# Patient Record
Sex: Female | Born: 1935
Health system: Southern US, Community
[De-identification: ages and names within clinical notes are randomized; demographics above are authoritative.]

## PROBLEM LIST (undated history)

## (undated) DIAGNOSIS — G4700 Insomnia, unspecified: Secondary | ICD-10-CM

## (undated) DIAGNOSIS — K449 Diaphragmatic hernia without obstruction or gangrene: Secondary | ICD-10-CM

## (undated) DIAGNOSIS — E039 Hypothyroidism, unspecified: Secondary | ICD-10-CM

## (undated) DIAGNOSIS — Z9181 History of falling: Secondary | ICD-10-CM

## (undated) DIAGNOSIS — S91109A Unspecified open wound of unspecified toe(s) without damage to nail, initial encounter: Secondary | ICD-10-CM

## (undated) DIAGNOSIS — M810 Age-related osteoporosis without current pathological fracture: Secondary | ICD-10-CM

## (undated) DIAGNOSIS — H9319 Tinnitus, unspecified ear: Secondary | ICD-10-CM

## (undated) DIAGNOSIS — M545 Low back pain, unspecified: Secondary | ICD-10-CM

## (undated) DIAGNOSIS — F411 Generalized anxiety disorder: Secondary | ICD-10-CM

## (undated) DIAGNOSIS — K5900 Constipation, unspecified: Secondary | ICD-10-CM

## (undated) DIAGNOSIS — M1990 Unspecified osteoarthritis, unspecified site: Secondary | ICD-10-CM

## (undated) DIAGNOSIS — K219 Gastro-esophageal reflux disease without esophagitis: Secondary | ICD-10-CM

## (undated) DIAGNOSIS — M5430 Sciatica, unspecified side: Secondary | ICD-10-CM

## (undated) DIAGNOSIS — I10 Essential (primary) hypertension: Secondary | ICD-10-CM

## (undated) DIAGNOSIS — N200 Calculus of kidney: Secondary | ICD-10-CM

## (undated) DIAGNOSIS — L723 Sebaceous cyst: Secondary | ICD-10-CM

## (undated) DIAGNOSIS — H3530 Unspecified macular degeneration: Secondary | ICD-10-CM

## (undated) DIAGNOSIS — J301 Allergic rhinitis due to pollen: Secondary | ICD-10-CM

## (undated) DIAGNOSIS — N6019 Diffuse cystic mastopathy of unspecified breast: Secondary | ICD-10-CM

## (undated) DIAGNOSIS — H269 Unspecified cataract: Secondary | ICD-10-CM

## (undated) DIAGNOSIS — I480 Paroxysmal atrial fibrillation: Secondary | ICD-10-CM

## (undated) DIAGNOSIS — G609 Hereditary and idiopathic neuropathy, unspecified: Secondary | ICD-10-CM

## (undated) DIAGNOSIS — R42 Dizziness and giddiness: Secondary | ICD-10-CM

## (undated) DIAGNOSIS — E785 Hyperlipidemia, unspecified: Secondary | ICD-10-CM

## (undated) HISTORY — DX: Sciatica, unspecified side: M54.30

## (undated) HISTORY — DX: Unspecified macular degeneration: H35.30

## (undated) HISTORY — PX: ABDOMINAL HYSTERECTOMY: SHX81

## (undated) HISTORY — DX: Constipation, unspecified: K59.00

## (undated) HISTORY — DX: Sebaceous cyst: L72.3

## (undated) HISTORY — DX: Hyperlipidemia, unspecified: E78.5

## (undated) HISTORY — PX: CATARACT EXTRACTION, BILATERAL: SHX1313

## (undated) HISTORY — DX: Gastro-esophageal reflux disease without esophagitis: K21.9

## (undated) HISTORY — DX: Unspecified cataract: H26.9

## (undated) HISTORY — DX: Hypothyroidism, unspecified: E03.9

## (undated) HISTORY — DX: Diaphragmatic hernia without obstruction or gangrene: K44.9

## (undated) HISTORY — DX: Dizziness and giddiness: R42

## (undated) HISTORY — DX: Essential (primary) hypertension: I10

## (undated) HISTORY — DX: Calculus of kidney: N20.0

## (undated) HISTORY — DX: Diffuse cystic mastopathy of unspecified breast: N60.19

## (undated) HISTORY — DX: Insomnia, unspecified: G47.00

## (undated) HISTORY — DX: Low back pain, unspecified: M54.50

## (undated) HISTORY — DX: Age-related osteoporosis without current pathological fracture: M81.0

## (undated) HISTORY — DX: Generalized anxiety disorder: F41.1

## (undated) HISTORY — DX: History of falling: Z91.81

## (undated) HISTORY — DX: Unspecified osteoarthritis, unspecified site: M19.90

## (undated) HISTORY — DX: Unspecified open wound of unspecified toe(s) without damage to nail, initial encounter: S91.109A

## (undated) HISTORY — DX: Tinnitus, unspecified ear: H93.19

## (undated) HISTORY — DX: Hereditary and idiopathic neuropathy, unspecified: G60.9

## (undated) HISTORY — DX: Low back pain: M54.5

## (undated) HISTORY — DX: Allergic rhinitis due to pollen: J30.1

---
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
## 2001-08-03 ENCOUNTER — Encounter: Payer: Self-pay | Admitting: Internal Medicine

## 2001-08-03 ENCOUNTER — Encounter: Admission: RE | Admit: 2001-08-03 | Discharge: 2001-08-03 | Payer: Self-pay | Admitting: Internal Medicine

## 2002-01-22 ENCOUNTER — Encounter: Payer: Self-pay | Admitting: Internal Medicine

## 2002-01-22 ENCOUNTER — Encounter: Admission: RE | Admit: 2002-01-22 | Discharge: 2002-01-22 | Payer: Self-pay | Admitting: Internal Medicine

## 2002-05-01 LAB — HM COLONOSCOPY

## 2002-08-26 ENCOUNTER — Encounter: Admission: RE | Admit: 2002-08-26 | Discharge: 2002-08-26 | Payer: Self-pay | Admitting: Internal Medicine

## 2002-08-26 ENCOUNTER — Encounter: Payer: Self-pay | Admitting: Internal Medicine

## 2003-10-10 ENCOUNTER — Encounter: Admission: RE | Admit: 2003-10-10 | Discharge: 2003-10-10 | Payer: Self-pay | Admitting: Internal Medicine

## 2004-03-03 ENCOUNTER — Ambulatory Visit (HOSPITAL_COMMUNITY): Admission: RE | Admit: 2004-03-03 | Discharge: 2004-03-03 | Payer: Self-pay | Admitting: Specialist

## 2004-04-14 ENCOUNTER — Ambulatory Visit (HOSPITAL_COMMUNITY): Admission: RE | Admit: 2004-04-14 | Discharge: 2004-04-14 | Payer: Self-pay | Admitting: Specialist

## 2004-11-11 ENCOUNTER — Encounter: Admission: RE | Admit: 2004-11-11 | Discharge: 2004-11-11 | Payer: Self-pay | Admitting: Internal Medicine

## 2005-11-14 ENCOUNTER — Encounter: Admission: RE | Admit: 2005-11-14 | Discharge: 2005-11-14 | Payer: Self-pay | Admitting: Internal Medicine

## 2006-11-30 ENCOUNTER — Encounter: Admission: RE | Admit: 2006-11-30 | Discharge: 2006-11-30 | Payer: Self-pay | Admitting: Internal Medicine

## 2007-07-23 ENCOUNTER — Inpatient Hospital Stay (HOSPITAL_COMMUNITY): Admission: EM | Admit: 2007-07-23 | Discharge: 2007-07-30 | Payer: Self-pay | Admitting: *Deleted

## 2007-07-25 ENCOUNTER — Encounter (INDEPENDENT_AMBULATORY_CARE_PROVIDER_SITE_OTHER): Payer: Self-pay | Admitting: Surgery

## 2007-07-25 HISTORY — PX: CHOLECYSTECTOMY: SHX55

## 2007-12-11 ENCOUNTER — Encounter: Admission: RE | Admit: 2007-12-11 | Discharge: 2007-12-11 | Payer: Self-pay | Admitting: Internal Medicine

## 2008-12-15 ENCOUNTER — Encounter: Admission: RE | Admit: 2008-12-15 | Discharge: 2008-12-15 | Payer: Self-pay | Admitting: Internal Medicine

## 2009-04-04 ENCOUNTER — Emergency Department (HOSPITAL_COMMUNITY): Admission: EM | Admit: 2009-04-04 | Discharge: 2009-04-04 | Payer: Self-pay | Admitting: Emergency Medicine

## 2009-04-06 ENCOUNTER — Ambulatory Visit (HOSPITAL_COMMUNITY): Admission: RE | Admit: 2009-04-06 | Discharge: 2009-04-06 | Payer: Self-pay | Admitting: Ophthalmology

## 2009-12-23 ENCOUNTER — Encounter: Admission: RE | Admit: 2009-12-23 | Discharge: 2009-12-23 | Payer: Self-pay | Admitting: Internal Medicine

## 2010-11-20 LAB — CBC
HCT: 38.1 % (ref 36.0–46.0)
Hemoglobin: 12.8 g/dL (ref 12.0–15.0)
MCHC: 33.7 g/dL (ref 30.0–36.0)
MCV: 85.5 fL (ref 78.0–100.0)
Platelets: 153 10*3/uL (ref 150–400)
WBC: 6.8 10*3/uL (ref 4.0–10.5)

## 2010-11-20 LAB — EYE CULTURE: Culture: NO GROWTH

## 2010-11-20 LAB — GRAM STAIN

## 2010-11-20 LAB — BASIC METABOLIC PANEL
CO2: 31 mEq/L (ref 19–32)
Chloride: 100 mEq/L (ref 96–112)
GFR calc non Af Amer: >60 mL/min (ref >60)

## 2010-12-28 NOTE — Op Note (Signed)
[name], [name]                 ACCOUNT NO.:  [id number]   MEDICAL RECORD NO.:  [id number]          PATIENT TYPE:  INP   LOCATION:  5709                         FACILITY:  [facility]   PHYSICIAN:  Currie Paris, M.D.DATE OF BIRTH:  19-Nov-1930   DATE OF PROCEDURE:  07/25/2007  DATE OF DISCHARGE:                               OPERATIVE REPORT   PREOPERATIVE DIAGNOSIS:  Acute cholecystitis with recent pancreatitis.   POSTOPERATIVE DIAGNOSIS:  Acute cholecystitis with recent pancreatitis.   OPERATION:  Laparoscopic cholecystectomy with operative cholangiogram.   SURGEON:  Currie Paris, M.D.   ASSISTANT:  Cherylynn Ridges, M.D.   ANESTHESIA:  General endotracheal.   CLINICAL HISTORY:  This is a 77 year old lady who presented to the  emergency room with epigastric pain, which began after eating hamburger.  It was rated a 9/10 when she came in.  Her past history is noted for  hypertension and other medical issues.  On evaluation she was found to  have a white count of 14,000 with a lipase of 3400.  Abdominal  ultrasound showed what looked like a little thick-walled gallbladder but  no obvious stones.  Admission diagnosis was acute pancreatitis.  Follow-  up CT scan yesterday evening, however, showed a markedly worse situation  around the gallbladder with free fluid, question of gallbladder  perforation.  She appeared to have acute cholecystitis and there were  stones noted on the CT scan not seen on the prior ultrasound.  With this  finding, we elected to proceed to doing surgery with a diagnosis of an  acute cholecystitis.   DESCRIPTION OF PROCEDURE:  The patient was seen again in the holding  area and she had no further questions.  She was taken to the operating  room and after satisfactory general endotracheal anesthesia had been  obtained, the abdomen was prepped and draped.  The time-out was  performed.   The umbilical incision was made after anesthetizing the skin  and  subcutaneous tissue with 0.25% plain Marcaine.  The fascia was opened  and peritoneal cavity entered under direct vision.  The Hasson was  introduced and the abdomen insufflated to 15.   There was bloody free fluid present in the abdominal cavity.  There was  a mass-like effect at the junction of the falciform ligament with the  gallbladder, which I initially could not tell whether this was tumor,  hematoma, inflammatory.  There was some necrotic material lying  alongside the gallbladder and the gallbladder was acutely inflamed with  areas of necrosis consistent with acute cholecystitis.  There was,  however, no leaking bile but what looked like some blood.   Additional trocars were placed in the usual positions.  I sent some of  the necrotic-looking tissue to pathology for frozen, but this just all  turned out to be inflammatory.  In perusing the abdomen a little bit  more, we could see areas of fat necrosis consistent with pancreatitis.  These were primarily small areas, but I eventually decided that this  area seen at the falciform-liver junction was inflammatory, probably fat  necrosis  from the pancreatitis with perhaps some hemorrhage.   I was then able to grasp the gallbladder and retract it in the usual  fashion and began to dissect around the neck of the gallbladder.  This  was fairly edematous but eventually I was able to identify and get  around a tubular structure which I identified as the cystic duct and  thought I was really at the junction of the cystic duct and the  gallbladder.  I was able to free up the peritoneum on  each side of the  gallbladder and see the cystic artery posteriorly.  Once I had a nice  long segment of cystic duct, I opened it at the junction with the  gallbladder and attempted a couple of times to do a cholangiogram.  There is a little leak where we had dissected the cystic duct a little  bit more proximally and I was finally able to get a clip  across that so  that it did not leak and we were able to get a cholangiogram.   The cholangiogram showed good filling of a very long cystic duct,  filling of the common duct with no filling defects noted.  The hepatic  radicles appeared to be okay.  The pancreatic duct did fill. Contrast  entered the duodenum.   Once we had the anatomy identified, I went ahead and divided the cystic  duct.  I then tied it with an Endoloop of PDS plus a clip and resected a  small segment.  The gallbladder was then removed from below to above  with branches of the cystic artery clipped as identified.  It was placed  in a bag.  A irrigated copiously and made sure everything was dry.  I  took a few more looks at the area of concern around the edge of the  liver, which was inflammatory.  I then put a 19 Blake drain in to lay  along the bed of the gallbladder.  I used least 2 L of irrigation to  make sure we had everything diluted out.  We made a final check of the  bed of gallbladder and there was no leakage of either blood or bile, and  the clips appeared to be intact with no leaking around the cystic duct  stump.   With that done I removed the remaining lateral port.  The drain was  secured with a 2-0 nylon.  The umbilical port was removed and the figure-  of-eight suture used to close that incision.  The abdomen was deflated  through the epigastric port.  Skin was closed with 4-0 subcuticular and  Dermabond.   The patient tolerated the procedure well.  There were no operative  complications.  Counts were correct.      Currie Paris, M.D.  Electronically Signed     CJS/MEDQ  D:  07/25/2007  T:  07/26/2007  Job:  [id number]   cc:   Lenon Curt. Chilton Si, M.D.

## 2010-12-28 NOTE — Op Note (Signed)
[name], [name]                 ACCOUNT NO.:  [id number]   MEDICAL RECORD NO.:  [id number]          PATIENT TYPE:  AMB   LOCATION:  SDS                          FACILITY:  [facility]   PHYSICIAN:  Alford Highland. Rankin, M.D.   DATE OF BIRTH:  05/20/1931   DATE OF PROCEDURE:  04/06/2009  DATE OF DISCHARGE:  04/06/2009                               OPERATIVE REPORT   PREOPERATIVE DIAGNOSIS:  Endophthalmitis suspected, left eye.   POSTOPERATIVE DIAGNOSES:  1. Endophthalmitis suspected, left eye.  2. 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   PROCEDURES:  1. Posterior vitrectomy and membrane peel - epiretinal membrane - 25      gauge, left eye.  2. Posterior synechialysis.  3. Instillation of intravitreal antibiotics, Fortaz 2.25 mg and      vancomycin 1 mg.  4. Vitreous culture of vitreous fluids with the needle biopsy as well      as with vitreous washings sent from the anterior chamber in the      vitreous cavity.   SURGEON:  Alford Highland. Rankin, MD   ANESTHESIA:  Local retrobulbar, monitored anesthesia control.   INDICATION FOR THE PROCEDURE:  The patient is a 77 year old woman with a  long history of age-related macular generation requiring intravitreal  injections.  Some 5-6 days previously, underwent uncomplicated  intravitreal office base injection and found to have increasing floaters  over the next 2-3 days.  She was seen over the weekend by the on-call  physician, who noted a mild vitreous change but no real change in  vision.  The patient has no significant pain since the onset of her  symptoms.   Today in the office, she was found to have hypopyon, pupillary fibrin  membrane over the pupil as well as vitreous debris consistent with  possible suspected endophthalmitis.  The patient understands this is an  attempt to remove the vitreous opacification and haze.  She understands  the risk of anesthesia including  the rare occurrence of death, but also  to the eye including but not limited to hemorrhage, infection, need for  another surgery, loss of vision, progressive disease despite  intervention.   After appropriate signed consent was obtained, the patient was taken to  the operating room.  In the operating room, appropriate monitors  followed by mild sedation.  Xylocaine 2% 5 mL injected retrobulbar with  an additional 5 mL laterally in fashion of modified Darel Hong.  Left  periocular region was prepped and draped in the usual ophthalmic  fashion.  Lid speculum applied.  A Lewicky anterior chamber maintainer  was placed via paracentesis incision with a 20-gauge MVR blade  inferotemporally.  A 30-gauge needle was then used to aspirate small of  aqueous fluid, which was plated directly.  20 MVR blade was used to make  separate paracentesis incision.  Fusion was then turned on.  This  allowed for engagement of the pupillary fibrin membrane directly with  forceps, and this was extracted.  Portions of this were  directly plated  for culture.  Thereafter, the superior 25-gauge trocars were applied.  Core vitrectomy was then begun.  Notable findings of significant white  and purulent type of debris throughout the vitreous cavity.  Membranes  were also attached to the posterior pole.  These were gently elevated,  and no complications occurred.  Notable findings were of significant  intraretinal hemorrhages peripherally.  The macula did not appear to be  infarcted.  The optic nerve still appeared to be pink.  The retina  remained nicely attached.  Peripheral vitreous skirt trimmed.  No  attempts were made to do any forceps extraction off the posterior pole.   At this time, the instruments were removed from the eye.  The vitreous  opacity had been removed.  The superior trocars were removed.  The  infusion was removed.  The intraocular pressure was found be adequate to  receive 0.1 mL of Fortaz 2.25 mg  and vancomycin 0.1 mL with 1.0 mg.   These were injected intravitreally and the remnants of these were  injected in a subconjunctival fashion.  These were done away from the  sclerotomy sites.  Sterile patch and Fox shield applied.  The patient  tolerated the procedure without complication.  IV antibiotics were  begun.  The patient will be discharged to home today after IV antibiotic  is complete.  Sterile patch and Fox shield applied.  The patient was  taken to the recovery room.      Alford Highland Rankin, M.D.  Electronically Signed     GAR/MEDQ  D:  04/06/2009  T:  04/07/2009  Job:  [id number]

## 2010-12-28 NOTE — Discharge Summary (Signed)
NAMESHAMBRIA, [name] NO.:  [id number]   MEDICAL RECORD NO.:  [id number]          PATIENT TYPE:  INP   LOCATION:  5709                         FACILITY:  [facility]   PHYSICIAN:  Lonia Blood, M.D.DATE OF BIRTH:  1931/06/30   DATE OF ADMISSION:  07/22/2007  DATE OF DISCHARGE:  07/30/2007                               DISCHARGE SUMMARY   PRIMARY CARE PHYSICIAN:  Lenon Curt. Chilton Si, M.D.   DISCHARGE DIAGNOSES:  1. Gallstone pancreatitis, resolved.  2. Acute cholecystitis.      a.     Status post laparoscopic cholecystectomy during this       admission.      b.     Jackson-Pratt drain intact, to be followed by general       surgery.  3. Hypokalemia, seven days additional replacement without patient      followup recommended.  4. Hyperlipidemia.  5. Hiatal hernia with gastroesophageal reflux disease.  6. Hypothyroidism.  7. Osteoporosis.  8. Hypertension.   DISCHARGE MEDICATIONS:  1. Actonel 30 mg weekly.  2. Aspirin 81 mg daily.  3. Claritin 10 mg daily.  4. Doxazosin 4 mg daily.  5. Lozol 1.25 mg daily.  6. Lipitor 20 mg daily.  7. Omeprazole 20 mg daily.  8. Synthroid 75 mcg p.o. daily.  9. Restoril 15 mg p.o. nightly.  10.Toprol XL 50 mg p.o. daily.  11.Tylenol p.r.n.  12.Potassium chloride 20 mEq p.o. b.i.d.  Take for the 7 days, then      stop.  13.Zofran 4 mg q.6h. p.r.n. nausea.  14.Vicodin 5/325 mg 1-2 tablet by mouth every 4 hours p.r.n. pain.   FOLLOWUP:  1. Patient is advised to follow up with Dr. Cicero Duck of general      surgery on Friday, August 02, 2007 at 3:30 p.m.  At that time,      reassessment of the J-P drain will be made.  2. Patient is advised to follow up with her primary care physician,      Dr. Frederik Pear in 7-10 days for a recheck.  At that time, BMET      should be maintained, and consideration could be given to      discontinuing potassium replacement therapy.   CONSULTATIONS:  Dr. Cicero Duck with general  surgery.   PROCEDURES:  Laparoscopic cholecystectomy with intraoperative  cholangiogram, July 25, 2007.   HISTORY OF PRESENT ILLNESS:  [name] is a 77 year old female who  presented to the hospital with severe epigastric abdominal pain on  July 23, 2007.  Physical exam and labs at that time supported the  diagnosis of acute pancreatitis with a markedly elevated lipase at 3463.  The patient was admitted to the acute unit.  She was maintained n.p.o.  She was hydrated.  Electrolytes were replaced.  CT scan of the abdomen  and pelvis were carried out on July 24, 2007 to evaluate for possible  etiologies of the patient's acute pancreatitis.  This raised the  question of a possible acute cholecystitis. This was further supported  with a nuclear medicine hepatobiliary scan  on July 25, 2007.  General surgery was consulted.  The patient was taken to the operating  theater, and a laparoscopic cholecystectomy with intraoperative  cholangiogram was carried out.  J-P drain was placed to assist with  drainage from the gallbladder fossa.  The patient was returned to the  medical unit, and the remainder of hospitalization was wholly  unremarkable, as the patient steadily improved.  No further medical  complications were encountered.   On March 27, 2007, the patient was cleared for discharge from a  surgical standpoint.  The medical team also agreed with appropriate  replacement of the patient's electrolytes.  She was appropriate for  discharge home.   She is discharged on the above-listed medical regimen with plans for  followup as noted above.      Lonia Blood, M.D.  Electronically Signed     JTM/MEDQ  D:  07/30/2007  T:  07/30/2007  Job:  [id number]   cc:   Lenon Curt. Chilton Si, M.D.  Currie Paris, M.D.

## 2010-12-28 NOTE — H&P (Signed)
[name], [name] NO.:  [id number]   MEDICAL RECORD NO.:  [id number]          PATIENT TYPE:  INP   LOCATION:  2006                         FACILITY:  [facility]   PHYSICIAN:  Della Goo, M.D. DATE OF BIRTH:  12-06-1930   DATE OF ADMISSION:  07/23/2007  DATE OF DISCHARGE:                              HISTORY & PHYSICAL   PRIMARY CARE PHYSICIAN:  Dr. Frederik Pear.   CHIEF COMPLAINT:  Epigastric abdominal pain.   HISTORY OF PRESENT ILLNESS:  This is a 77 year old female who presents  to the emergency department secondary to complaints of severe epigastric  abdominal pain which started shortly after eating a hamburger at 3:00  p.m. prior to admission.  She reports having severe abdominal pain,  rated the pain as being a 9/10 sharp and burning in intensity.  She  describes having nausea later, but no vomiting, and she reports that the  pain was unremitting after 3 hours.  She denies having any diarrhea,  fevers, chills, shortness of breath or chest pain associated with this  discomfort.  She does report having a hiatal hernia and believes that  she was having hiatal hernia discomfort which she does have at times.   PAST MEDICAL HISTORY:  Significant for hypertension, hyperlipidemia and  hiatal hernia along with hypothyroidism, osteoporosis and allergies.   PAST SURGICAL HISTORY:  1. Total abdominal hysterectomy.  2. The patient has had cataract surgeries on both eyes.   MEDICATIONS:  1. Synthroid 75 mcg one p.o. daily.  2. Omeprazole 20 mg one p.o. daily.  3. Lipitor 20 mg one p.o. daily.  4. Toprol XL 50 mg one p.o. daily.  5. Aspirin 81 mg one p.o. daily.  6. Actonel 30 mg one p.o. q. week on Wednesdays.  7. Claritin 10 mg one p.o. daily.  8. Temazepam 15 mg one p.o. q.h.s.  9. Doxazosin 4 mg one p.o. daily.  10.Indapamide 1.25 mg two tablets p.o. daily, alternating with one      tablet p.o. daily.  11.Tylenol Arthritis 500 mg one p.o. daily.   ALLERGIES:  BACTRIM.   SOCIAL HISTORY:  The patient is married, nonsmoker, nondrinker.   FAMILY HISTORY:  Noncontributory.   REVIEW OF SYSTEMS:  Pertinent for mentioned above.   PHYSICAL EXAMINATION FINDINGS:  GENERAL:  This is a 77 year old thin  female in discomfort but no acute distress.  VITAL SIGNS:  Temperature 98.6, blood pressure 162/72, heart rate 103,  respirations 18, O2 sat 97% on room air.  HEENT:  Normocephalic, atraumatic.  Pupils equally round reactive to  light.  Extraocular muscles are intact funduscopic benign oropharynx is  clear.  NECK:  Supple full range of motion.  No thyromegaly, adenopathy or  jugular venous distention.  CARDIOVASCULAR:  Regular rate and rhythm.  Mild tachycardia.  LUNGS:  Clear to auscultation bilaterally.  ABDOMEN:  Positive bowel sounds, soft, mildly tender in the epigastrium.  No rebound, no guarding.  No hepatosplenomegaly.  EXTREMITIES: Without cyanosis, clubbing or edema.  Neurologic  examination alert and oriented x3.  No focal deficits.   LABORATORY STUDIES:  White  blood cell count 14.1, hemoglobin 13.4,  hematocrit 39.9, platelets 155, neutrophils 90%, lymphocytes 7%.  Sodium  140, potassium 2.9, chloride 101, carbon dioxide 30, BUN 18, creatinine  0.76, glucose 172, calcium 9.0, albumin 3.9, AST 86, lipase level 3463.  Urinalysis small urine hemoglobin otherwise negative.  Abdominal x-ray  series, chest x-ray portion reveals no active disease process.  Abdominal portions revealed no free air and nonobstructive bowel gas  pattern.   PLAN:  To the medications the and my White blood cell count 14.1,  hemoglobin 13.4, hematocrit 39.9, platelets 155, neutrophils 90%,  lymphocytes 7%.  Sodium 140, potassium 2.9, chloride 101, carbon dioxide  30, BUN 18, creatinine 0.76, glucose 172, calcium 9.0, albumin 3.blets  9, AST 86, lipase level 3463.  Urinalysis small urine hemoglobin  otherwise negative.  Abdominal x-ray series, chest x-ray  portion reveals  no active disease process.  Abdominal portions revealed no free air and  nonobstructive bowel gas pattern..   ASSESSMENT:  A 77 year old female being admitted with:  1. Acute pancreatitis.  2. Abdominal pain secondary #1.  3. Hypokalemia.  4. Hypertension.  5. Mild hyperglycemia.   PLAN:  The patient will be admitted to telemetry area for cardiac  monitoring.  The patient will be made n.p.o. for now and pain control  with antiemetic therapy has been ordered.  IV Protonix has been ordered  as well and DVT prophylaxis has also been ordered.  Gastroenterology  will be consulted in the a.m.      Della Goo, M.D.  Electronically Signed     HJ/MEDQ  D:  07/23/2007  T:  07/24/2007  Job:  [id number]   cc:   Lenon Curt. Chilton Si, M.D.

## 2010-12-28 NOTE — Consult Note (Signed)
NAMECLORA, OHMER NO.:  [id number]   MEDICAL RECORD NO.:  [id number]          PATIENT TYPE:  INP   LOCATION:  2006                         FACILITY:  [facility]   PHYSICIAN:  Currie Paris, M.D.DATE OF BIRTH:  Sep 10, 1930   DATE OF CONSULTATION:  07/24/2007  DATE OF DISCHARGE:                                 CONSULTATION   TIME:  11 a.m.   PRIMARY CARE PHYSICIAN:  Lenon Curt. Chilton Si, M.D.   REASON FOR CONSULTATION:  Questionable biliary pancreatitis.   HISTORY OF PRESENT ILLNESS:  [name] is a 77 year old female patient  relatively healthy.  She does have a history of hypertension for which  she takes medication as well as dyslipidemia and hypothyroidism.  She  reports a 17-month history of indigestion/reflux, especially exacerbated  by fatty or greasy foods such as cheeseburgers and hamburgers.  She was  admitted July 22, 2007 through the ER by the [facility] for  severe unrelenting epigastric pain with associated nausea and vomiting.  This began after she ate a hamburger.  When she presented, her initial  lipase was 3463 with mild transaminitis with elevated AST and ALT but  total bilirubin was 1.4, alkaline phosphatase was normal.  Subsequent  ultrasound revealed gallbladder wall thickening without any stones, no  ductal dilatation, questionable pericholecystic fluid.  Her white count  was elevated at 14,000.  The patient was subsequently admitted; n.p.o.  status and IV fluid hydration was begun and in the interim her lipase  has decreased to 150, but her white count has gone up to 19,200.  Her  transaminitis has essentially resolved with a mild elevation in AST  remaining.  Because of the increasing white count, Unasyn has been  started empirically by the medicine team today.  On exam, the patient is  still quite tender in the upper abdomen, especially in the epigastric  and more so in the right upper quadrant region.  Fasting lipids were  obtained and the patient's triglycerides level was normal.  Surgical  consultation has been requested to help determine etiology of  pancreatitis.   REVIEW OF SYSTEMS:  As above.  The patient denies fevers or chills and  she is currently afebrile.  She does report that since the onset of her  symptoms 6 months prior that when she would have episodic discomfort at  home that this would be followed by diarrhea.  She does have a history  of chronic constipation and she has passed flatus today but only with  the assistance of a suppository.   PAST MEDICAL HISTORY:  1. Hypertension.  2. Chronic constipation.  3. Hypothyroidism.  4. Dyslipidemia.   PAST SURGICAL HISTORY:  The patient denies.   ALLERGIES:  NONE.   CURRENT MEDICATIONS:  The patient is currently on IV Phenergan, sliding  scale insulin, Lovenox for DVT prophylaxis.  She has just been started  on IV Unasyn.  She is on Protonix IV, Synthroid IV.  They are also  giving her Zocor, Toprol and Cardura by mouth and she also has IV fluids  infusing and she  has various p.r.n. medications for symptom management.   PHYSICAL EXAM:  GENERAL:  Pleasant female patient complaining of acute  onset of upper abdominal pain not as severe but still persistent.  VITAL SIGNS:  Temp 97.2, BP 117/73, pulse 100, respirations 20.  NEURO:  The patient is alert and oriented x3, moving all extremities x4,  no focal deficits.  HEENT:  Head normocephalic, sclerae not injected.  NECK:  Supple, no adenopathy.  CHEST:  Bilateral lung sounds clear to auscultation.  Respirations  unlabored.  She is on room air.  CARDIAC:  S1 and S2, no obvious rubs, murmurs, thrills; no gallops.  No  JVD.  She has an early tachycardia.  Pulse is regular.  ABDOMEN:  Soft and distended.  She has hyperactive bowel sounds.  She is  tender in the supraumbilical area without guarding.  Slightly tender in  the left upper quadrant region.  She is more so tender in the epigastric   region and she is most tender in the right upper quadrant region  consistent with a positive Murphy sign.  No obvious hepatosplenomegaly,  masses or bruits.  She has bruising on her abdomen consistent with prior  Lovenox injection.  EXTREMITIES:  They are symmetrical in appearance without edema, cyanosis  or clubbing.  Pulses are palpable.   LAB:  White count is now 19,200, hemoglobin 11.9, platelets 131,000,  they were normal on admission.  Sodium 137, potassium 3.6, CO2 24,  glucose 96, BUN 22, creatinine 0.8, triglycerides were 27, lipids as  noted.  Her initial LFTs were total bilirubin 0.9, AST 45, AST 86 and  alkaline phosphatase 93.  Today they are total bilirubin 1.4, ALT 33,  AST 69 and alkaline phosphatase 53.   DIAGNOSTICS:  Ultrasound as noted.   IMPRESSION:  1. Acute pancreatitis, probable biliary etiology based on history.  2. Questionable acute cholecystitis.  3. Leukocytosis.  4. Possible early mild ileus in the setting of the patient's chronic      constipation.   PLAN:  1. Continue n.p.o. status and IV fluids for now until can clarify      degree of pancreatitis.  2. Agree with empiric Unasyn.  Leukocytosis is probably secondary to      acute inflammatory changes from recent acute pancreatitis but they      could also be indicative of an acute cholecystitis.  3. Again, uncertain if the patient has acute cholecystitis.  Her      history is certainly consistent with intermittent biliary colic,      given onset of symptoms, postprandial after eating fatty diet.  She      also has dumping syndrome as well after these episodes at home.      Therefore, she could either be passing tiny stones or sludge and      having biliary colic or she could have severe biliary dyskinesia      causing the same symptoms.  4. We will obtain a CT of the abdomen and pelvis initially to clarify      degree of pancreatitis and if there is any peripancreatic edema,      noting that the  pericholecystic edema seen on the ultrasound could      be secondary effect of peripancreatic edema and not true      cholecystitis, the transaminitis could also be related to the same      issues.  5. Due to significant right upper quadrant abdominal pain and  persistent leukocytosis which is increasing, we will go ahead and      obtain a HIDA scan in the morning with CCK to check for ejection      fraction and determine if she has any ductal obstruction problems      of the biliary system.  If the CT demonstrates significant      peripancreatic edema and inflammation and she has an obstructive      HIDA scan, she may need percutaneous drainage first and then be      evaluated at a later date for elective cholecystectomy.  If the CT      shows only mild peripancreatic edema, and she has an abnormal HIDA      scan, then we will probably proceed with a laparoscopic, possible      open cholecystectomy this admission, once her pancreatitis has      completely resolved.  6. Additional recommendations per Dr. Jamey Ripa.  7. Because of the HIDA scan, the patient has to be off narcotic pain      medications for at least 6-8 hours, we will offer Toradol in      between that for pain management.      Allison L. Rennis Harding, N.P.      Currie Paris, M.D.  Electronically Signed    ALE/MEDQ  D:  07/24/2007  T:  07/24/2007  Job:  [id number]   cc:   Lenon Curt. Chilton Si, M.D.

## 2011-01-03 ENCOUNTER — Other Ambulatory Visit: Payer: Self-pay | Admitting: Internal Medicine

## 2011-01-03 DIAGNOSIS — Z1231 Encounter for screening mammogram for malignant neoplasm of breast: Secondary | ICD-10-CM

## 2011-01-19 ENCOUNTER — Ambulatory Visit
Admission: RE | Admit: 2011-01-19 | Discharge: 2011-01-19 | Disposition: A | Discharge status: Home / Self Care | Payer: Medicare Other | Source: Ambulatory Visit | Attending: Internal Medicine | Admitting: Internal Medicine

## 2011-01-19 DIAGNOSIS — Z1231 Encounter for screening mammogram for malignant neoplasm of breast: Secondary | ICD-10-CM

## 2011-05-20 LAB — BASIC METABOLIC PANEL
CO2: 31
Calcium: 7.7 — ABNORMAL LOW
Chloride: 97
Creatinine, Ser: 0.66
GFR calc Af Amer: >60
GFR calc non Af Amer: >60
Sodium: 137

## 2011-05-23 LAB — DIFFERENTIAL
Eosinophils Absolute: 0 — ABNORMAL LOW
Eosinophils Relative: 0
Lymphs Abs: 0.5 — ABNORMAL LOW
Lymphs Abs: 0.9
Monocytes Absolute: 0.4
Neutrophils Relative %: 90 — ABNORMAL HIGH

## 2011-05-23 LAB — CBC
HCT: 27 — ABNORMAL LOW
HCT: 28.4 — ABNORMAL LOW
HCT: 29.8 — ABNORMAL LOW
HCT: 39.8
HCT: 39.9
Hemoglobin: 10.3 — ABNORMAL LOW
Hemoglobin: 9.6 — ABNORMAL LOW
MCHC: 33.7
MCHC: 33.7
MCHC: 34.3
MCHC: 34.4
MCHC: 34.5
MCHC: 34.7
MCV: 83.3
MCV: 83.5
MCV: 83.7
MCV: 84.7
MCV: 85.5
Platelets: 125 — ABNORMAL LOW
Platelets: 131 — ABNORMAL LOW
Platelets: 141 — ABNORMAL LOW
Platelets: 144 — ABNORMAL LOW
Platelets: 151
Platelets: 155
RBC: 3.56 — ABNORMAL LOW
RBC: 4.71
RDW: 13.7
RDW: 13.8
RDW: 14.1
RDW: 14.3
WBC: 13.1 — ABNORMAL HIGH
WBC: 19.2 — ABNORMAL HIGH
WBC: 8.2

## 2011-05-23 LAB — COMPREHENSIVE METABOLIC PANEL
ALT: 45 — ABNORMAL HIGH
ALT: 66 — ABNORMAL HIGH
AST: 18
AST: 59 — ABNORMAL HIGH
AST: 68 — ABNORMAL HIGH
AST: 95 — ABNORMAL HIGH
Albumin: 1.8 — ABNORMAL LOW
Albumin: 2.5 — ABNORMAL LOW
Albumin: 2.9 — ABNORMAL LOW
Alkaline Phosphatase: 113
Alkaline Phosphatase: 93
BUN: 1 — ABNORMAL LOW
BUN: 12
BUN: 18
CO2: 22
CO2: 30
Calcium: 6.3 — CL
Calcium: 7.2 — ABNORMAL LOW
Calcium: 7.4 — ABNORMAL LOW
Calcium: 7.5 — ABNORMAL LOW
Chloride: 101
Chloride: 102
Chloride: 106
Creatinine, Ser: 0.53
Creatinine, Ser: 0.76
Creatinine, Ser: 0.8
Creatinine, Ser: 0.85
GFR calc Af Amer: >60
GFR calc Af Amer: >60
GFR calc Af Amer: >60
GFR calc Af Amer: >60
GFR calc non Af Amer: >60
Glucose, Bld: 117 — ABNORMAL HIGH
Potassium: 4.1
Sodium: 139
Total Bilirubin: 0.9
Total Bilirubin: 1.6 — ABNORMAL HIGH
Total Protein: 5.4 — ABNORMAL LOW
Total Protein: 5.5 — ABNORMAL LOW
Total Protein: 6.5

## 2011-05-23 LAB — BASIC METABOLIC PANEL
CO2: 21
Calcium: 8.1 — ABNORMAL LOW
Chloride: 104
GFR calc Af Amer: >60
GFR calc non Af Amer: 57 — ABNORMAL LOW
Glucose, Bld: 74
Potassium: 3.1 — ABNORMAL LOW
Sodium: 134 — ABNORMAL LOW
Sodium: 141

## 2011-05-23 LAB — URINALYSIS, ROUTINE W REFLEX MICROSCOPIC
Bilirubin Urine: NEGATIVE
Nitrite: NEGATIVE
Specific Gravity, Urine: 1.016
Urobilinogen, UA: 1
pH: 6

## 2011-05-23 LAB — HEMOGLOBIN A1C
Hgb A1c MFr Bld: 5.7
Mean Plasma Glucose: 126

## 2011-05-23 LAB — URINE MICROSCOPIC-ADD ON

## 2011-05-23 LAB — PROTIME-INR
INR: 1.1
Prothrombin Time: 14

## 2011-05-23 LAB — AMYLASE, BODY FLUID: Amylase, Fluid: 102

## 2011-05-23 LAB — APTT: aPTT: 40 — ABNORMAL HIGH

## 2011-05-23 LAB — HEPATIC FUNCTION PANEL
Albumin: 3.3 — ABNORMAL LOW
Total Bilirubin: 1.1
Total Protein: 6.3

## 2011-05-23 LAB — LIPID PANEL
LDL Cholesterol: 61
Triglycerides: 27

## 2011-05-23 LAB — MAGNESIUM: Magnesium: 1.8

## 2011-08-24 DIAGNOSIS — H35059 Retinal neovascularization, unspecified, unspecified eye: Secondary | ICD-10-CM | POA: Diagnosis not present

## 2011-08-24 DIAGNOSIS — H353290 Exudative age-related macular degeneration, unspecified eye, stage unspecified: Secondary | ICD-10-CM | POA: Diagnosis not present

## 2011-09-27 DIAGNOSIS — H353290 Exudative age-related macular degeneration, unspecified eye, stage unspecified: Secondary | ICD-10-CM | POA: Diagnosis not present

## 2011-09-27 DIAGNOSIS — H35359 Cystoid macular degeneration, unspecified eye: Secondary | ICD-10-CM | POA: Diagnosis not present

## 2011-10-31 DIAGNOSIS — E785 Hyperlipidemia, unspecified: Secondary | ICD-10-CM | POA: Diagnosis not present

## 2011-10-31 DIAGNOSIS — I10 Essential (primary) hypertension: Secondary | ICD-10-CM | POA: Diagnosis not present

## 2011-10-31 DIAGNOSIS — E039 Hypothyroidism, unspecified: Secondary | ICD-10-CM | POA: Diagnosis not present

## 2011-11-07 DIAGNOSIS — H35059 Retinal neovascularization, unspecified, unspecified eye: Secondary | ICD-10-CM | POA: Diagnosis not present

## 2011-11-07 DIAGNOSIS — H353290 Exudative age-related macular degeneration, unspecified eye, stage unspecified: Secondary | ICD-10-CM | POA: Diagnosis not present

## 2011-12-12 DIAGNOSIS — H35059 Retinal neovascularization, unspecified, unspecified eye: Secondary | ICD-10-CM | POA: Diagnosis not present

## 2011-12-12 DIAGNOSIS — H353290 Exudative age-related macular degeneration, unspecified eye, stage unspecified: Secondary | ICD-10-CM | POA: Diagnosis not present

## 2012-01-18 DIAGNOSIS — H35059 Retinal neovascularization, unspecified, unspecified eye: Secondary | ICD-10-CM | POA: Diagnosis not present

## 2012-01-18 DIAGNOSIS — H353290 Exudative age-related macular degeneration, unspecified eye, stage unspecified: Secondary | ICD-10-CM | POA: Diagnosis not present

## 2012-01-30 ENCOUNTER — Other Ambulatory Visit: Payer: Self-pay | Admitting: Internal Medicine

## 2012-01-30 DIAGNOSIS — Z1231 Encounter for screening mammogram for malignant neoplasm of breast: Secondary | ICD-10-CM

## 2012-02-07 DIAGNOSIS — H35359 Cystoid macular degeneration, unspecified eye: Secondary | ICD-10-CM | POA: Diagnosis not present

## 2012-02-07 DIAGNOSIS — H353290 Exudative age-related macular degeneration, unspecified eye, stage unspecified: Secondary | ICD-10-CM | POA: Diagnosis not present

## 2012-02-07 DIAGNOSIS — H35059 Retinal neovascularization, unspecified, unspecified eye: Secondary | ICD-10-CM | POA: Diagnosis not present

## 2012-02-21 DIAGNOSIS — H353290 Exudative age-related macular degeneration, unspecified eye, stage unspecified: Secondary | ICD-10-CM | POA: Diagnosis not present

## 2012-02-21 DIAGNOSIS — H35059 Retinal neovascularization, unspecified, unspecified eye: Secondary | ICD-10-CM | POA: Diagnosis not present

## 2012-02-27 ENCOUNTER — Ambulatory Visit
Admission: RE | Admit: 2012-02-27 | Discharge: 2012-02-27 | Disposition: A | Discharge status: Home / Self Care | Payer: Medicare Other | Source: Ambulatory Visit | Attending: Internal Medicine | Admitting: Internal Medicine

## 2012-02-27 DIAGNOSIS — Z1231 Encounter for screening mammogram for malignant neoplasm of breast: Secondary | ICD-10-CM

## 2012-03-14 DIAGNOSIS — H35059 Retinal neovascularization, unspecified, unspecified eye: Secondary | ICD-10-CM | POA: Diagnosis not present

## 2012-03-14 DIAGNOSIS — H35359 Cystoid macular degeneration, unspecified eye: Secondary | ICD-10-CM | POA: Diagnosis not present

## 2012-03-14 DIAGNOSIS — H353290 Exudative age-related macular degeneration, unspecified eye, stage unspecified: Secondary | ICD-10-CM | POA: Diagnosis not present

## 2012-03-29 DIAGNOSIS — H35059 Retinal neovascularization, unspecified, unspecified eye: Secondary | ICD-10-CM | POA: Diagnosis not present

## 2012-03-29 DIAGNOSIS — H353290 Exudative age-related macular degeneration, unspecified eye, stage unspecified: Secondary | ICD-10-CM | POA: Diagnosis not present

## 2012-04-18 DIAGNOSIS — H35359 Cystoid macular degeneration, unspecified eye: Secondary | ICD-10-CM | POA: Diagnosis not present

## 2012-04-18 DIAGNOSIS — H353290 Exudative age-related macular degeneration, unspecified eye, stage unspecified: Secondary | ICD-10-CM | POA: Diagnosis not present

## 2012-04-18 DIAGNOSIS — H35059 Retinal neovascularization, unspecified, unspecified eye: Secondary | ICD-10-CM | POA: Diagnosis not present

## 2012-04-19 ENCOUNTER — Encounter: Payer: Self-pay | Admitting: Internal Medicine

## 2012-04-25 DIAGNOSIS — J301 Allergic rhinitis due to pollen: Secondary | ICD-10-CM | POA: Diagnosis not present

## 2012-04-25 DIAGNOSIS — S91109A Unspecified open wound of unspecified toe(s) without damage to nail, initial encounter: Secondary | ICD-10-CM | POA: Diagnosis not present

## 2012-04-25 DIAGNOSIS — K5900 Constipation, unspecified: Secondary | ICD-10-CM | POA: Diagnosis not present

## 2012-04-25 DIAGNOSIS — F411 Generalized anxiety disorder: Secondary | ICD-10-CM | POA: Diagnosis not present

## 2012-05-15 DIAGNOSIS — H353290 Exudative age-related macular degeneration, unspecified eye, stage unspecified: Secondary | ICD-10-CM | POA: Diagnosis not present

## 2012-05-15 DIAGNOSIS — H35059 Retinal neovascularization, unspecified, unspecified eye: Secondary | ICD-10-CM | POA: Diagnosis not present

## 2012-05-30 DIAGNOSIS — H353290 Exudative age-related macular degeneration, unspecified eye, stage unspecified: Secondary | ICD-10-CM | POA: Diagnosis not present

## 2012-05-30 DIAGNOSIS — H35059 Retinal neovascularization, unspecified, unspecified eye: Secondary | ICD-10-CM | POA: Diagnosis not present

## 2012-06-26 DIAGNOSIS — H353290 Exudative age-related macular degeneration, unspecified eye, stage unspecified: Secondary | ICD-10-CM | POA: Diagnosis not present

## 2012-06-26 DIAGNOSIS — H35059 Retinal neovascularization, unspecified, unspecified eye: Secondary | ICD-10-CM | POA: Diagnosis not present

## 2012-07-25 DIAGNOSIS — H35059 Retinal neovascularization, unspecified, unspecified eye: Secondary | ICD-10-CM | POA: Diagnosis not present

## 2012-07-25 DIAGNOSIS — H353290 Exudative age-related macular degeneration, unspecified eye, stage unspecified: Secondary | ICD-10-CM | POA: Diagnosis not present

## 2012-08-13 DIAGNOSIS — H353290 Exudative age-related macular degeneration, unspecified eye, stage unspecified: Secondary | ICD-10-CM | POA: Diagnosis not present

## 2012-08-13 DIAGNOSIS — H35059 Retinal neovascularization, unspecified, unspecified eye: Secondary | ICD-10-CM | POA: Diagnosis not present

## 2012-09-17 DIAGNOSIS — H353290 Exudative age-related macular degeneration, unspecified eye, stage unspecified: Secondary | ICD-10-CM | POA: Diagnosis not present

## 2012-09-17 DIAGNOSIS — H35359 Cystoid macular degeneration, unspecified eye: Secondary | ICD-10-CM | POA: Diagnosis not present

## 2012-09-17 DIAGNOSIS — H35059 Retinal neovascularization, unspecified, unspecified eye: Secondary | ICD-10-CM | POA: Diagnosis not present

## 2012-09-20 DIAGNOSIS — H35059 Retinal neovascularization, unspecified, unspecified eye: Secondary | ICD-10-CM | POA: Diagnosis not present

## 2012-09-20 DIAGNOSIS — H353290 Exudative age-related macular degeneration, unspecified eye, stage unspecified: Secondary | ICD-10-CM | POA: Diagnosis not present

## 2012-10-18 DIAGNOSIS — M810 Age-related osteoporosis without current pathological fracture: Secondary | ICD-10-CM | POA: Diagnosis not present

## 2012-10-18 DIAGNOSIS — E785 Hyperlipidemia, unspecified: Secondary | ICD-10-CM | POA: Diagnosis not present

## 2012-10-18 DIAGNOSIS — I10 Essential (primary) hypertension: Secondary | ICD-10-CM | POA: Diagnosis not present

## 2012-10-18 DIAGNOSIS — E039 Hypothyroidism, unspecified: Secondary | ICD-10-CM | POA: Diagnosis not present

## 2012-10-24 DIAGNOSIS — G609 Hereditary and idiopathic neuropathy, unspecified: Secondary | ICD-10-CM | POA: Diagnosis not present

## 2012-10-24 DIAGNOSIS — K449 Diaphragmatic hernia without obstruction or gangrene: Secondary | ICD-10-CM | POA: Diagnosis not present

## 2012-10-24 DIAGNOSIS — E039 Hypothyroidism, unspecified: Secondary | ICD-10-CM | POA: Diagnosis not present

## 2012-10-24 DIAGNOSIS — E785 Hyperlipidemia, unspecified: Secondary | ICD-10-CM | POA: Diagnosis not present

## 2012-10-24 DIAGNOSIS — I10 Essential (primary) hypertension: Secondary | ICD-10-CM | POA: Diagnosis not present

## 2012-10-26 DIAGNOSIS — H353290 Exudative age-related macular degeneration, unspecified eye, stage unspecified: Secondary | ICD-10-CM | POA: Diagnosis not present

## 2012-10-26 DIAGNOSIS — H35059 Retinal neovascularization, unspecified, unspecified eye: Secondary | ICD-10-CM | POA: Diagnosis not present

## 2012-11-01 DIAGNOSIS — M899 Disorder of bone, unspecified: Secondary | ICD-10-CM | POA: Diagnosis not present

## 2012-11-01 DIAGNOSIS — Z8262 Family history of osteoporosis: Secondary | ICD-10-CM | POA: Diagnosis not present

## 2012-11-01 DIAGNOSIS — M949 Disorder of cartilage, unspecified: Secondary | ICD-10-CM | POA: Diagnosis not present

## 2012-11-07 DIAGNOSIS — H902 Conductive hearing loss, unspecified: Secondary | ICD-10-CM | POA: Diagnosis not present

## 2012-11-07 DIAGNOSIS — H6120 Impacted cerumen, unspecified ear: Secondary | ICD-10-CM | POA: Diagnosis not present

## 2012-11-09 ENCOUNTER — Other Ambulatory Visit: Payer: Self-pay | Admitting: *Deleted

## 2012-11-09 DIAGNOSIS — I10 Essential (primary) hypertension: Secondary | ICD-10-CM

## 2012-11-09 DIAGNOSIS — E039 Hypothyroidism, unspecified: Secondary | ICD-10-CM

## 2012-11-09 DIAGNOSIS — E785 Hyperlipidemia, unspecified: Secondary | ICD-10-CM

## 2012-11-12 ENCOUNTER — Other Ambulatory Visit (INDEPENDENT_AMBULATORY_CARE_PROVIDER_SITE_OTHER): Payer: Medicare Other | Admitting: *Deleted

## 2012-11-12 ENCOUNTER — Other Ambulatory Visit: Payer: Medicare Other

## 2012-11-12 VITALS — BP 122/68 | HR 66 | Temp 95.6°F | Resp 16 | Ht 63.0 in | Wt 129.0 lb

## 2012-11-12 DIAGNOSIS — E785 Hyperlipidemia, unspecified: Secondary | ICD-10-CM

## 2012-11-12 DIAGNOSIS — I10 Essential (primary) hypertension: Secondary | ICD-10-CM

## 2012-11-12 DIAGNOSIS — E039 Hypothyroidism, unspecified: Secondary | ICD-10-CM | POA: Diagnosis not present

## 2012-11-13 LAB — THYROID PANEL WITH TSH
Free Thyroxine Index: 3.2 (ref 1.2–4.9)
T3 Uptake Ratio: 33 % (ref 24–39)
T4, Total: 9.7 ug/dL (ref 4.5–12.0)

## 2012-11-13 LAB — LIPID PANEL: Cholesterol, Total: 130 mg/dL (ref 100–199)

## 2012-11-19 ENCOUNTER — Ambulatory Visit: Payer: Self-pay

## 2012-11-22 DIAGNOSIS — H353290 Exudative age-related macular degeneration, unspecified eye, stage unspecified: Secondary | ICD-10-CM | POA: Diagnosis not present

## 2012-11-22 DIAGNOSIS — H35359 Cystoid macular degeneration, unspecified eye: Secondary | ICD-10-CM | POA: Diagnosis not present

## 2012-11-24 ENCOUNTER — Other Ambulatory Visit: Payer: Self-pay | Admitting: Internal Medicine

## 2012-11-29 DIAGNOSIS — H35059 Retinal neovascularization, unspecified, unspecified eye: Secondary | ICD-10-CM | POA: Diagnosis not present

## 2012-11-29 DIAGNOSIS — H353290 Exudative age-related macular degeneration, unspecified eye, stage unspecified: Secondary | ICD-10-CM | POA: Diagnosis not present

## 2012-12-07 ENCOUNTER — Encounter: Payer: Self-pay | Admitting: Internal Medicine

## 2012-12-10 ENCOUNTER — Other Ambulatory Visit: Payer: Self-pay | Admitting: Internal Medicine

## 2012-12-24 ENCOUNTER — Other Ambulatory Visit: Payer: Self-pay | Admitting: *Deleted

## 2012-12-24 MED ORDER — TEMAZEPAM 30 MG PO CAPS: 30.0000 mg | ORAL_CAPSULE | Freq: Every evening | ORAL | Status: DC | PRN

## 2013-01-07 ENCOUNTER — Other Ambulatory Visit: Payer: Self-pay | Admitting: Internal Medicine

## 2013-01-09 DIAGNOSIS — H35359 Cystoid macular degeneration, unspecified eye: Secondary | ICD-10-CM | POA: Diagnosis not present

## 2013-01-09 DIAGNOSIS — H3570 Unspecified separation of retinal layers: Secondary | ICD-10-CM | POA: Diagnosis not present

## 2013-01-09 DIAGNOSIS — H353290 Exudative age-related macular degeneration, unspecified eye, stage unspecified: Secondary | ICD-10-CM | POA: Diagnosis not present

## 2013-01-15 DIAGNOSIS — H353290 Exudative age-related macular degeneration, unspecified eye, stage unspecified: Secondary | ICD-10-CM | POA: Diagnosis not present

## 2013-01-15 DIAGNOSIS — H35059 Retinal neovascularization, unspecified, unspecified eye: Secondary | ICD-10-CM | POA: Diagnosis not present

## 2013-01-24 ENCOUNTER — Encounter: Payer: Self-pay | Admitting: Internal Medicine

## 2013-01-24 ENCOUNTER — Ambulatory Visit (INDEPENDENT_AMBULATORY_CARE_PROVIDER_SITE_OTHER): Payer: Medicare Other | Admitting: Internal Medicine

## 2013-01-24 VITALS — BP 142/78 | HR 72 | Temp 98.2°F | Resp 14 | Ht 63.0 in | Wt 134.0 lb

## 2013-01-24 DIAGNOSIS — J019 Acute sinusitis, unspecified: Secondary | ICD-10-CM

## 2013-01-24 MED ORDER — DOXYCYCLINE HYCLATE 100 MG PO TABS: 100.0000 mg | ORAL_TABLET | Freq: Two times a day (BID) | ORAL | Status: DC

## 2013-01-24 MED ORDER — HYDROCOD POLST-CHLORPHEN POLST 10-8 MG/5ML PO LQCR: 5.0000 mL | Freq: Four times a day (QID) | ORAL | Status: DC | PRN

## 2013-01-24 NOTE — Progress Notes (Signed)
Patient ID: RAILYN HOUSE, female   DOB: 12-23-1930, 79 y.o.   MRN: [id number]  Allergies  Allergen Reactions  . Ambien (Zolpidem Tartrate)     hallucination  . Bactrim (Sulfamethoxazole W-Trimethoprim)   . Other     z pack     Chief Complaint  Patient presents with  . Sinusitis    HPI: Patient is a 79 y.o. white female seen in the office today for sinus infection.   Hoarse, has sinus infection.  Coughing up green stuff.  Gets one occasionally.  Someone else has been ill Nurse, adult).  No fever, chills.  Has no appetite.  Is trying to drink fluids.  Is happy she can now speak.  She is in a hurry to get home b/c her dishwasher is being repaired or replaced.    Review of Systems:  Review of Systems  Constitutional: Positive for chills and malaise/fatigue. Negative for fever, weight loss and diaphoresis.  HENT: Positive for congestion and sore throat. Negative for ear pain.   Respiratory: Positive for cough and sputum production. Negative for shortness of breath and wheezing.   Cardiovascular: Negative for chest pain.  Gastrointestinal: Negative for abdominal pain and diarrhea.  Genitourinary: Negative for dysuria.  Musculoskeletal: Negative for myalgias and falls.  Skin: Negative for rash.  Neurological: Positive for weakness. Negative for dizziness.     Past Medical History  Diagnosis Date  . Lumbago   . Personal history of fall   . Anxiety state, unspecified   . Unspecified constipation   . Open wound of toe(s), without mention of complication   . Allergic rhinitis due to pollen   . Unspecified tinnitus   . Unspecified hereditary and idiopathic peripheral neuropathy   . Insomnia, unspecified   . Unspecified hereditary and idiopathic peripheral neuropathy   . Insomnia, unspecified   . Diaphragmatic hernia without mention of obstruction or gangrene   . Sebaceous cyst   . Dizziness and giddiness   . Diffuse cystic mastopathy   . Osteoarthrosis, unspecified  whether generalized or localized, unspecified site   . Unspecified hypothyroidism   . Other and unspecified hyperlipidemia   . Unspecified essential hypertension   . Sciatica   . Macular degeneration (senile) of retina, unspecified   . Senile osteoporosis   . Calculus of kidney    History reviewed. No pertinent past surgical history. Social History:   reports that she has never smoked. She does not have any smokeless tobacco history on file. She reports that she does not drink alcohol or use illicit drugs.  No family history on file.  Medications: Patient's Medications  New Prescriptions   No medications on file  Previous Medications   ACETAMINOPHEN (TYLENOL) 500 MG TABLET    Take 500 mg by mouth every 6 (six) hours as needed for pain. Take 1-2 tablets every 8 hours as needed for pain   ASPIRIN 81 MG TABLET    Take 81 mg by mouth daily. Take one tablet once a day   ATORVASTATIN (LIPITOR) 20 MG TABLET    TAKE 1 TABLET BY MOUTH EVERY DAY TO LOWER CHOLESTEROL   BEVACIZUMAB (AVASTIN) 100 MG/4ML SOLN    Inject monthly in both eyes twice   CHLORPHENIRAMINE-HYDROCODONE (TUSSIONEX PENNKINETIC ER) 10-8 MG/5ML LQCR    Take 5 mLs by mouth. Take one tsp every 12 hours as needed for cough   DOXAZOSIN (CARDURA) 4 MG TABLET    TAKE 1 TABLET BY MOUTH DAILY   INDAPAMIDE (LOZOL) 1.25 MG  TABLET    Take 1.25 mg by mouth every morning. Take one tablet to prevent fluid retention and to control blood pressure   LEVOTHYROXINE (SYNTHROID, LEVOTHROID) 75 MCG TABLET    Take 75 mcg by mouth daily. Take one tablet once a day for thyroid supplement   LORATADINE (CLARITIN) 10 MG TABLET    Take 10 mg by mouth daily. Take one tablet once a day as needed for allergies   METOPROLOL SUCCINATE (TOPROL-XL) 50 MG 24 HR TABLET    TAKE 1 TABLET BY MOUTH EVERY DAY   MULTIPLE VITAMINS-MINERALS (PRESERVISION AREDS) TABS    Take by mouth. Take one tablet twice a day for vision   POLYETHYLENE GLYCOL (MIRALAX / GLYCOLAX) PACKET     Take 17 g by mouth daily. Mix 17 grams in a 8 oz glass for constipation needed   TEMAZEPAM (RESTORIL) 30 MG CAPSULE    Take 1 capsule (30 mg total) by mouth at bedtime as needed for sleep.  Modified Medications   No medications on file  Discontinued Medications   ESTROGENS, CONJUGATED, (PREMARIN) 0.45 MG TABLET    Take 0.45 mg by mouth daily. Apply three times a day   FLUTICASONE (FLOVENT DISKUS) 50 MCG/BLIST DISKUS INHALER    Inhale 1 puff into the lungs 2 (two) times daily. One puff in each nostrils daily for sinuses and allergies   RANIBIZUMAB (LUCENTIS) 0.3 MG/0.05ML SOLN    Inject into the eye. Inject in the left eye every 5 weeks and every 6 weeks in right eye     Physical Exam: Filed Vitals:   01/24/13 1142  BP: 142/78  Pulse: 72  Temp: 98.2 F (36.8 C)  TempSrc: Oral  Resp: 14  Height: 5\' 3"  (1.6 m)  Weight: 134 lb (60.782 kg)  Physical Exam  Constitutional: She appears well-developed and well-nourished.  Slim Caucasian female, well-dressed and groomed  HENT:  Head: Normocephalic and atraumatic.  Right Ear: External ear normal.  Left Ear: External ear normal.  Mouth/Throat: Oropharyngeal exudate present.  Postnasal drip and erythema present  Eyes: Conjunctivae and EOM are normal. Pupils are equal, round, and reactive to light. Right eye exhibits no discharge. Left eye exhibits no discharge.  Neck: Normal range of motion.  No cervical adenopathy  Cardiovascular: Normal rate, regular rhythm, normal heart sounds and intact distal pulses.   Pulmonary/Chest: Effort normal and breath sounds normal.  Abdominal: Bowel sounds are normal.  Skin: Skin is warm and dry. No rash noted.  Psychiatric: She has a normal mood and affect.   Assessment/Plan 1. Acute sinusitis --recommended adequate sleep and plenty of fluids --warm humidity for congestion, could use nettipot if desired - doxycycline (VIBRA-TABS) 100 MG tablet; Take 1 tablet (100 mg total) by mouth 2 (two) times daily.   Dispense: 20 tablet; Refill: 0 - chlorpheniramine-HYDROcodone (TUSSIONEX PENNKINETIC ER) 10-8 MG/5ML LQCR; Take 5 mLs by mouth every 6 (six) hours as needed (cough). Take one tsp every 12 hours as needed for cough  Dispense: 140 mL; Refill: 0 --pt left in a hurry and I learned I could not fax her tussionex and it had to be called to her pharmacy  Keep appt with Dr. Chilton Si as scheduled.

## 2013-01-29 ENCOUNTER — Other Ambulatory Visit: Payer: Self-pay | Admitting: *Deleted

## 2013-01-29 MED ORDER — OMEPRAZOLE 20 MG PO CPDR: DELAYED_RELEASE_CAPSULE | ORAL | Status: DC

## 2013-02-04 ENCOUNTER — Other Ambulatory Visit: Payer: Self-pay

## 2013-02-04 DIAGNOSIS — Z1231 Encounter for screening mammogram for malignant neoplasm of breast: Secondary | ICD-10-CM

## 2013-02-13 DIAGNOSIS — H35059 Retinal neovascularization, unspecified, unspecified eye: Secondary | ICD-10-CM | POA: Diagnosis not present

## 2013-02-13 DIAGNOSIS — H353290 Exudative age-related macular degeneration, unspecified eye, stage unspecified: Secondary | ICD-10-CM | POA: Diagnosis not present

## 2013-02-22 ENCOUNTER — Other Ambulatory Visit: Payer: Self-pay | Admitting: Internal Medicine

## 2013-02-23 ENCOUNTER — Other Ambulatory Visit: Payer: Self-pay | Admitting: Internal Medicine

## 2013-02-25 ENCOUNTER — Other Ambulatory Visit: Payer: Self-pay | Admitting: Internal Medicine

## 2013-02-25 ENCOUNTER — Other Ambulatory Visit: Payer: Self-pay | Admitting: Geriatric Medicine

## 2013-02-27 ENCOUNTER — Ambulatory Visit: Payer: Medicare Other

## 2013-02-27 ENCOUNTER — Ambulatory Visit: Payer: Self-pay | Admitting: Internal Medicine

## 2013-03-19 DIAGNOSIS — H353290 Exudative age-related macular degeneration, unspecified eye, stage unspecified: Secondary | ICD-10-CM | POA: Diagnosis not present

## 2013-03-19 DIAGNOSIS — H35359 Cystoid macular degeneration, unspecified eye: Secondary | ICD-10-CM | POA: Diagnosis not present

## 2013-03-21 DIAGNOSIS — H353290 Exudative age-related macular degeneration, unspecified eye, stage unspecified: Secondary | ICD-10-CM | POA: Diagnosis not present

## 2013-03-21 DIAGNOSIS — H35059 Retinal neovascularization, unspecified, unspecified eye: Secondary | ICD-10-CM | POA: Diagnosis not present

## 2013-03-26 ENCOUNTER — Ambulatory Visit: Payer: Medicare Other

## 2013-04-04 ENCOUNTER — Ambulatory Visit
Admission: RE | Admit: 2013-04-04 | Discharge: 2013-04-04 | Disposition: A | Discharge status: Home / Self Care | Payer: Medicare Other | Source: Ambulatory Visit

## 2013-04-04 DIAGNOSIS — Z1231 Encounter for screening mammogram for malignant neoplasm of breast: Secondary | ICD-10-CM | POA: Diagnosis not present

## 2013-04-25 DIAGNOSIS — H353290 Exudative age-related macular degeneration, unspecified eye, stage unspecified: Secondary | ICD-10-CM | POA: Diagnosis not present

## 2013-04-25 DIAGNOSIS — H35059 Retinal neovascularization, unspecified, unspecified eye: Secondary | ICD-10-CM | POA: Diagnosis not present

## 2013-04-29 ENCOUNTER — Encounter: Payer: Self-pay | Admitting: Internal Medicine

## 2013-04-29 ENCOUNTER — Other Ambulatory Visit: Payer: Medicare Other

## 2013-04-29 DIAGNOSIS — E039 Hypothyroidism, unspecified: Secondary | ICD-10-CM

## 2013-04-29 DIAGNOSIS — E785 Hyperlipidemia, unspecified: Secondary | ICD-10-CM | POA: Diagnosis not present

## 2013-04-29 DIAGNOSIS — I10 Essential (primary) hypertension: Secondary | ICD-10-CM | POA: Diagnosis not present

## 2013-04-30 LAB — THYROID PANEL WITH TSH
Free Thyroxine Index: 2.8 (ref 1.2–4.9)
T3 Uptake Ratio: 30 % (ref 24–39)
T4, Total: 9.2 ug/dL (ref 4.5–12.0)
TSH: 4.26 u[IU]/mL (ref 0.450–4.500)

## 2013-05-01 ENCOUNTER — Ambulatory Visit (INDEPENDENT_AMBULATORY_CARE_PROVIDER_SITE_OTHER): Payer: Medicare Other | Admitting: Internal Medicine

## 2013-05-01 ENCOUNTER — Encounter: Payer: Self-pay | Admitting: Internal Medicine

## 2013-05-01 VITALS — BP 140/70 | HR 64 | Temp 97.7°F | Resp 18 | Ht 63.0 in | Wt 134.6 lb

## 2013-05-01 DIAGNOSIS — E039 Hypothyroidism, unspecified: Secondary | ICD-10-CM

## 2013-05-01 DIAGNOSIS — I10 Essential (primary) hypertension: Secondary | ICD-10-CM | POA: Diagnosis not present

## 2013-05-01 DIAGNOSIS — M545 Low back pain, unspecified: Secondary | ICD-10-CM | POA: Insufficient documentation

## 2013-05-01 DIAGNOSIS — G4700 Insomnia, unspecified: Secondary | ICD-10-CM

## 2013-05-01 DIAGNOSIS — E785 Hyperlipidemia, unspecified: Secondary | ICD-10-CM | POA: Diagnosis not present

## 2013-05-01 DIAGNOSIS — H3530 Unspecified macular degeneration: Secondary | ICD-10-CM | POA: Diagnosis not present

## 2013-05-01 DIAGNOSIS — K449 Diaphragmatic hernia without obstruction or gangrene: Secondary | ICD-10-CM

## 2013-05-01 DIAGNOSIS — G609 Hereditary and idiopathic neuropathy, unspecified: Secondary | ICD-10-CM

## 2013-05-01 NOTE — Progress Notes (Signed)
Subjective:    Patient ID: [name], female    DOB: 1930-12-07, 79 y.o.   MRN: [id number]  HPI Unspecified hypothyroidism: stable  Other and unspecified hyperlipidemia: Controlled  Unspecified essential hypertension: controlled  Macular degeneration (senile) of retina, unspecified: unchanged  Diaphragmatic hernia without mention of obstruction or gangrene: bloated at times. Gassy. Sometimes worse at night and she cannot sleep. Has been present for years. Using omeprazole daily. History of a nervous stomach.  Insomnia, unspecified: unchanged  Unspecified hereditary and idiopathic peripheral neuropathy: unchanged  Lumbago: unchanged    Current Outpatient Prescriptions on File Prior to Visit  Medication Sig Dispense Refill  . acetaminophen (TYLENOL) 500 MG tablet Take 500 mg by mouth every 6 (six) hours as needed for pain. Take 1-2 tablets every 8 hours as needed for pain      . aspirin 81 MG tablet Take 81 mg by mouth daily. Take one tablet once a day      . atorvastatin (LIPITOR) 20 MG tablet TAKE 1 TABLET BY MOUTH EVERY DAY TO LOWER CHOLESTEROL  90 tablet  4  . Bevacizumab (AVASTIN) 100 MG/4ML SOLN Inject monthly in both eyes twice      . chlorpheniramine-HYDROcodone (TUSSIONEX PENNKINETIC ER) 10-8 MG/5ML LQCR Take 5 mLs by mouth every 6 (six) hours as needed (cough). Take one tsp every 12 hours as needed for cough  140 mL  0  . doxazosin (CARDURA) 4 MG tablet TAKE 1 TABLET BY MOUTH DAILY  90 tablet  4  . indapamide (LOZOL) 1.25 MG tablet Take 1.25 mg by mouth every morning. Take one tablet to prevent fluid retention and to control blood pressure      . levothyroxine (SYNTHROID, LEVOTHROID) 75 MCG tablet Take 75 mcg by mouth daily. Take one tablet once a day for thyroid supplement      . loratadine (CLARITIN) 10 MG tablet Take 10 mg by mouth daily. Take one tablet once a day as needed for allergies      . metoprolol succinate (TOPROL-XL) 50 MG 24 hr tablet TAKE 1 TABLET BY  MOUTH EVERY DAY  90 tablet  4  . Multiple Vitamins-Minerals (PRESERVISION AREDS) TABS Take by mouth. Take one tablet twice a day for vision      . omeprazole (PRILOSEC) 20 MG capsule Take one tablet once daily for acid reflux  30 capsule  3  . polyethylene glycol (MIRALAX / GLYCOLAX) packet Take 17 g by mouth daily. Mix 17 grams in a 8 oz glass for constipation needed      . temazepam (RESTORIL) 30 MG capsule TAKE 1 CAPSULE BY MOUTH AT BEDTIME AS NEEDED FOR SLEEP  30 capsule  1   No current facility-administered medications on file prior to visit.    Review of Systems  Constitutional: Negative.   Eyes: Positive for visual disturbance.       Macular degeneration.  Respiratory: Negative for cough, chest tightness, shortness of breath and wheezing.        History of asthma.  Cardiovascular: Positive for palpitations. Negative for chest pain and leg swelling.  Gastrointestinal: Positive for constipation.       History of hemorrhoids.  Endocrine:       History hypothyroidism  Genitourinary: Negative for urgency, frequency, hematuria and flank pain.       History kidney stones at age 25.  Musculoskeletal: Positive for myalgias and back pain.  Skin: Negative.   Allergic/Immunologic: Negative.   Neurological: Negative.   Hematological: Negative.  Psychiatric/Behavioral: Negative.        Objective:BP 140/70  Pulse 64  Temp(Src) 97.7 F (36.5 C) (Oral)  Resp 18  Ht 5\' 3"  (1.6 m)  Wt 134 lb 9.6 oz (61.054 kg)  BMI 23.85 kg/m2  SpO2 98%    Physical Exam  Constitutional: She is oriented to person, place, and time. She appears well-nourished. No distress.  HENT:  Head: Normocephalic and atraumatic.  Right Ear: External ear normal.  Left Ear: External ear normal.  Nose: Nose normal.  Mouth/Throat: Oropharynx is clear and moist.  Partial hearing loss in both ears.  Eyes: Conjunctivae and EOM are normal. Pupils are equal, round, and reactive to light.  Corrective lenses.   Cardiovascular: Normal rate, regular rhythm, normal heart sounds and intact distal pulses.  Exam reveals no gallop and no friction rub.   No murmur heard. Pulmonary/Chest: No respiratory distress. She has no wheezes. She has no rales.  Abdominal: Bowel sounds are normal. She exhibits no distension and no mass. There is no tenderness.  Musculoskeletal: Normal range of motion. She exhibits no edema and no tenderness.  Neurological: She is alert and oriented to person, place, and time. She has normal reflexes. No cranial nerve deficit. Coordination normal.  Reduced vibratory sensation in both extremities.  Skin: No rash noted. No erythema. No pallor.  Multiple seborrheic keratoses.  Psychiatric: She has a normal mood and affect. Her behavior is normal. Judgment and thought content normal.      Office Visit on 05/01/2013  Component Date Value Range Status  . HM Mammogram 02/27/2012 Negative   Final  . HM Colonoscopy 05/01/2002 Hemorrhoids   Final  . HM Dexa Scan 05/02/2007 Osteopenia in back, Hips are normal   Final  Appointment on 04/29/2013  Component Date Value Range Status  . TSH 04/29/2013 4.260  0.450 - 4.500 uIU/mL Final  . T4, Total 04/29/2013 9.2  4.5 - 12.0 ug/dL Final  . T3 Uptake Ratio 04/29/2013 30  24 - 39 % Final  . Free Thyroxine Index 04/29/2013 2.8  1.2 - 4.9 Final       Assessment & Plan:  Unspecified hypothyroidism: stable on current meds  Other and unspecified hyperlipidemia: Controlled  Unspecified essential hypertension: controlled  Macular degeneration (senile) of retina, unspecified: unchanged. Getting Lucentis in left eye and Avastin in the right eye.  Diaphragmatic hernia without mention of obstruction or gangrene: continue current meds  Insomnia, unspecified: Stable  Unspecified hereditary and idiopathic peripheral neuropathy: Unchanged. Consider PNCV in the future if patient desires.  Lumbago: Chronic condition with mild discomfort almost every  day.

## 2013-05-01 NOTE — Patient Instructions (Signed)
Continue current medications. 

## 2013-05-07 DIAGNOSIS — H353190 Nonexudative age-related macular degeneration, unspecified eye, stage unspecified: Secondary | ICD-10-CM | POA: Diagnosis not present

## 2013-05-07 DIAGNOSIS — H353290 Exudative age-related macular degeneration, unspecified eye, stage unspecified: Secondary | ICD-10-CM | POA: Diagnosis not present

## 2013-05-07 DIAGNOSIS — H35059 Retinal neovascularization, unspecified, unspecified eye: Secondary | ICD-10-CM | POA: Diagnosis not present

## 2013-05-09 LAB — LIPID PANEL WITH LDL/HDL RATIO
Cholesterol, Total: 122 mg/dL (ref 100–199)
HDL: 52 mg/dL (ref >39)
LDL Calculated: 55 mg/dL (ref 0–99)
LDl/HDL Ratio: 1.1 ratio units (ref 0.0–3.2)
Triglycerides: 76 mg/dL (ref 0–149)
VLDL Cholesterol Cal: 15 mg/dL (ref 5–40)

## 2013-05-09 LAB — SPECIMEN STATUS REPORT

## 2013-05-27 ENCOUNTER — Ambulatory Visit: Payer: Medicare Other

## 2013-05-27 DIAGNOSIS — Z23 Encounter for immunization: Secondary | ICD-10-CM

## 2013-05-29 DIAGNOSIS — H35359 Cystoid macular degeneration, unspecified eye: Secondary | ICD-10-CM | POA: Diagnosis not present

## 2013-05-29 DIAGNOSIS — H353290 Exudative age-related macular degeneration, unspecified eye, stage unspecified: Secondary | ICD-10-CM | POA: Diagnosis not present

## 2013-06-05 DIAGNOSIS — H9319 Tinnitus, unspecified ear: Secondary | ICD-10-CM | POA: Diagnosis not present

## 2013-06-05 DIAGNOSIS — H6120 Impacted cerumen, unspecified ear: Secondary | ICD-10-CM | POA: Diagnosis not present

## 2013-07-02 DIAGNOSIS — H35059 Retinal neovascularization, unspecified, unspecified eye: Secondary | ICD-10-CM | POA: Diagnosis not present

## 2013-07-02 DIAGNOSIS — H353290 Exudative age-related macular degeneration, unspecified eye, stage unspecified: Secondary | ICD-10-CM | POA: Diagnosis not present

## 2013-07-08 DIAGNOSIS — H35059 Retinal neovascularization, unspecified, unspecified eye: Secondary | ICD-10-CM | POA: Diagnosis not present

## 2013-07-08 DIAGNOSIS — H353290 Exudative age-related macular degeneration, unspecified eye, stage unspecified: Secondary | ICD-10-CM | POA: Diagnosis not present

## 2013-07-16 ENCOUNTER — Other Ambulatory Visit: Payer: Self-pay | Admitting: Nurse Practitioner

## 2013-07-16 ENCOUNTER — Other Ambulatory Visit: Payer: Self-pay | Admitting: *Deleted

## 2013-08-12 DIAGNOSIS — H353290 Exudative age-related macular degeneration, unspecified eye, stage unspecified: Secondary | ICD-10-CM | POA: Diagnosis not present

## 2013-08-12 DIAGNOSIS — H35059 Retinal neovascularization, unspecified, unspecified eye: Secondary | ICD-10-CM | POA: Diagnosis not present

## 2013-08-20 DIAGNOSIS — H353290 Exudative age-related macular degeneration, unspecified eye, stage unspecified: Secondary | ICD-10-CM | POA: Diagnosis not present

## 2013-08-20 DIAGNOSIS — H35059 Retinal neovascularization, unspecified, unspecified eye: Secondary | ICD-10-CM | POA: Diagnosis not present

## 2013-09-07 ENCOUNTER — Other Ambulatory Visit: Payer: Self-pay | Admitting: Internal Medicine

## 2013-09-16 DIAGNOSIS — H35059 Retinal neovascularization, unspecified, unspecified eye: Secondary | ICD-10-CM | POA: Diagnosis not present

## 2013-09-16 DIAGNOSIS — H353290 Exudative age-related macular degeneration, unspecified eye, stage unspecified: Secondary | ICD-10-CM | POA: Diagnosis not present

## 2013-10-09 DIAGNOSIS — H353290 Exudative age-related macular degeneration, unspecified eye, stage unspecified: Secondary | ICD-10-CM | POA: Diagnosis not present

## 2013-10-09 DIAGNOSIS — H35059 Retinal neovascularization, unspecified, unspecified eye: Secondary | ICD-10-CM | POA: Diagnosis not present

## 2013-10-14 ENCOUNTER — Other Ambulatory Visit: Payer: Self-pay | Admitting: Internal Medicine

## 2013-10-16 ENCOUNTER — Other Ambulatory Visit: Payer: Self-pay | Admitting: Internal Medicine

## 2013-10-19 ENCOUNTER — Other Ambulatory Visit: Payer: Self-pay | Admitting: Internal Medicine

## 2013-10-21 DIAGNOSIS — H353290 Exudative age-related macular degeneration, unspecified eye, stage unspecified: Secondary | ICD-10-CM | POA: Diagnosis not present

## 2013-10-21 DIAGNOSIS — H35059 Retinal neovascularization, unspecified, unspecified eye: Secondary | ICD-10-CM | POA: Diagnosis not present

## 2013-11-01 ENCOUNTER — Other Ambulatory Visit: Payer: Medicare Other

## 2013-11-01 DIAGNOSIS — E039 Hypothyroidism, unspecified: Secondary | ICD-10-CM

## 2013-11-01 DIAGNOSIS — E785 Hyperlipidemia, unspecified: Secondary | ICD-10-CM | POA: Diagnosis not present

## 2013-11-01 DIAGNOSIS — I10 Essential (primary) hypertension: Secondary | ICD-10-CM | POA: Diagnosis not present

## 2013-11-02 LAB — COMPREHENSIVE METABOLIC PANEL
ALT: 16 IU/L (ref 0–32)
AST: 20 IU/L (ref 0–40)
Albumin/Globulin Ratio: 1.7 (ref 1.1–2.5)
Albumin: 4 g/dL (ref 3.5–4.7)
Alkaline Phosphatase: 112 IU/L (ref 39–117)
BILIRUBIN TOTAL: 0.4 mg/dL (ref 0.0–1.2)
BUN/Creatinine Ratio: 17 (ref 11–26)
BUN: 13 mg/dL (ref 8–27)
CALCIUM: 9.3 mg/dL (ref 8.7–10.3)
CO2: 25 mmol/L (ref 18–29)
Chloride: 97 mmol/L (ref 97–108)
Creatinine, Ser: 0.78 mg/dL (ref 0.57–1.00)
GFR calc Af Amer: 82 mL/min/{1.73_m2} (ref >59)
GFR calc non Af Amer: 72 mL/min/{1.73_m2} (ref >59)
Globulin, Total: 2.3 g/dL (ref 1.5–4.5)
Glucose: 97 mg/dL (ref 65–99)
POTASSIUM: 4 mmol/L (ref 3.5–5.2)
SODIUM: 142 mmol/L (ref 134–144)
Total Protein: 6.3 g/dL (ref 6.0–8.5)

## 2013-11-02 LAB — LIPID PANEL
Chol/HDL Ratio: 2.6 ratio units (ref 0.0–4.4)
Cholesterol, Total: 123 mg/dL (ref 100–199)
HDL: 48 mg/dL (ref >39)
LDL Calculated: 56 mg/dL (ref 0–99)
Triglycerides: 97 mg/dL (ref 0–149)
VLDL Cholesterol Cal: 19 mg/dL (ref 5–40)

## 2013-11-02 LAB — TSH: TSH: 4.24 u[IU]/mL (ref 0.450–4.500)

## 2013-11-05 ENCOUNTER — Encounter: Payer: Self-pay | Admitting: Internal Medicine

## 2013-11-05 ENCOUNTER — Ambulatory Visit (INDEPENDENT_AMBULATORY_CARE_PROVIDER_SITE_OTHER): Payer: Medicare Other | Admitting: Internal Medicine

## 2013-11-05 VITALS — BP 148/84 | HR 67 | Resp 10 | Wt 135.0 lb

## 2013-11-05 DIAGNOSIS — M545 Low back pain, unspecified: Secondary | ICD-10-CM

## 2013-11-05 DIAGNOSIS — E039 Hypothyroidism, unspecified: Secondary | ICD-10-CM

## 2013-11-05 DIAGNOSIS — E785 Hyperlipidemia, unspecified: Secondary | ICD-10-CM | POA: Diagnosis not present

## 2013-11-05 DIAGNOSIS — I10 Essential (primary) hypertension: Secondary | ICD-10-CM

## 2013-11-05 DIAGNOSIS — G4700 Insomnia, unspecified: Secondary | ICD-10-CM

## 2013-11-05 NOTE — Patient Instructions (Signed)
Continue medications as listed 

## 2013-11-05 NOTE — Progress Notes (Signed)
Patient ID: [name], female   DOB: 28-Apr-1931, 80 y.o.   MRN: [id number]    Location:    [facility]  Place of Service:  OFFICE   Allergies  Allergen Reactions  . Ambien [Zolpidem Tartrate]     hallucination  . Bactrim [Sulfamethoxazole-Trimethoprim]   . Other     z pack     Chief Complaint  Patient presents with  . Medical Managment of Chronic Issues    6 month follow-up, discuss labs (copy printed)   . Fall History    Neg Fall History   . Depression Screening    Neg Depression Screening     HPI:  Unspecified essential hypertension: SBP is a little higher than usual  Other and unspecified hyperlipidemia: controlled  Unspecified hypothyroidism: controlled  Insomnia, unspecified: stable  Lumbago: doing OK. Rarely uses any pain med    Medications: Patient's Medications  New Prescriptions   No medications on file  Previous Medications   ACETAMINOPHEN (TYLENOL) 500 MG TABLET    Take 500 mg by mouth every 6 (six) hours as needed for pain. Take 1-2 tablets every 8 hours as needed for pain   ASPIRIN 81 MG TABLET    Take 81 mg by mouth daily. Take one tablet once a day   ATORVASTATIN (LIPITOR) 20 MG TABLET    TAKE 1 TABLET BY MOUTH EVERY DAY TO LOWER CHOLESTEROL   BEVACIZUMAB (AVASTIN) 100 MG/4ML SOLN    Inject monthly in both eyes twice   CHLORPHENIRAMINE-HYDROCODONE (TUSSIONEX PENNKINETIC ER) 10-8 MG/5ML LQCR    Take 5 mLs by mouth every 6 (six) hours as needed (cough). Take one tsp every 12 hours as needed for cough   DOXAZOSIN (CARDURA) 4 MG TABLET    TAKE 1 TABLET BY MOUTH DAILY   INDAPAMIDE (LOZOL) 1.25 MG TABLET    TAKE 1 TABLET BY MOUTH EVERY DAY   LEVOTHYROXINE (SYNTHROID, LEVOTHROID) 75 MCG TABLET    Take 75 mcg by mouth daily. Take one tablet once a day for thyroid supplement   LORATADINE (CLARITIN) 10 MG TABLET    Take 10 mg by mouth daily. Take one tablet once a day as needed for allergies   METOPROLOL SUCCINATE (TOPROL-XL) 50 MG 24 HR TABLET    TAKE 1 TABLET  BY MOUTH EVERY DAY   MULTIPLE VITAMINS-MINERALS (PRESERVISION AREDS) TABS    Take by mouth. Take one tablet twice a day for vision   OMEPRAZOLE (PRILOSEC) 20 MG CAPSULE    Take one tablet once daily for acid reflux   POLYETHYLENE GLYCOL (MIRALAX / GLYCOLAX) PACKET    Take 17 g by mouth daily. Mix 17 grams in a 8 oz glass for constipation needed   RANIBIZUMAB (LUCENTIS) 0.3 MG/0.05ML SOLN    Inject into the eye every 5 (five) weeks. Inject into left eye every 5 weeks.   TEMAZEPAM (RESTORIL) 30 MG CAPSULE    TAKE 1 CAPSULE BY MOUTH AT BEDTIME AS NEEDED FOR SLEEP  Modified Medications   No medications on file  Discontinued Medications   No medications on file     Review of Systems  Constitutional: Negative.   Eyes: Positive for visual disturbance.       Macular degeneration.  Respiratory: Negative for cough, chest tightness, shortness of breath and wheezing.        History of asthma.  Cardiovascular: Positive for palpitations. Negative for chest pain and leg swelling.  Gastrointestinal: Positive for constipation.       History of hemorrhoids.  Endocrine:       History hypothyroidism  Genitourinary: Negative for urgency, frequency, hematuria and flank pain.       History kidney stones at age 59.  Musculoskeletal: Positive for back pain and myalgias.  Skin: Negative.   Allergic/Immunologic: Negative.   Neurological: Negative.   Hematological: Negative.   Psychiatric/Behavioral: Negative.     Filed Vitals:   11/05/13 1400  BP: 148/84  Pulse: 67  Resp: 10  Weight: 135 lb (61.236 kg)  SpO2: 94%   Physical Exam  Constitutional: She is oriented to person, place, and time. She appears well-nourished. No distress.  HENT:  Head: Normocephalic and atraumatic.  Right Ear: External ear normal.  Left Ear: External ear normal.  Nose: Nose normal.  Mouth/Throat: Oropharynx is clear and moist.  Partial hearing loss in both ears.  Eyes: Conjunctivae and EOM are normal. Pupils are equal,  round, and reactive to light.  Corrective lenses.  Cardiovascular: Normal rate, regular rhythm, normal heart sounds and intact distal pulses.  Exam reveals no gallop and no friction rub.   No murmur heard. Pulmonary/Chest: No respiratory distress. She has no wheezes. She has no rales.  Abdominal: Bowel sounds are normal. She exhibits no distension and no mass. There is no tenderness.  Musculoskeletal: Normal range of motion. She exhibits no edema and no tenderness.  Neurological: She is alert and oriented to person, place, and time. She has normal reflexes. No cranial nerve deficit. Coordination normal.  Reduced vibratory sensation in both extremities.  Skin: No rash noted. No erythema. No pallor.  Multiple seborrheic keratoses.  Psychiatric: She has a normal mood and affect. Her behavior is normal. Judgment and thought content normal.     Labs reviewed: Appointment on 11/01/2013  Component Date Value Ref Range Status  . Glucose 11/01/2013 97  65 - 99 mg/dL Final  . BUN 16/05/9603 13  8 - 27 mg/dL Final  . Creatinine, Ser 11/01/2013 0.78  0.57 - 1.00 mg/dL Final  . GFR calc non Af Amer 11/01/2013 72  >59 mL/min/1.73 Final  . GFR calc Af Amer 11/01/2013 82  >59 mL/min/1.73 Final  . BUN/Creatinine Ratio 11/01/2013 17  11 - 26 Final  . Sodium 11/01/2013 142  134 - 144 mmol/L Final  . Potassium 11/01/2013 4.0  3.5 - 5.2 mmol/L Final  . Chloride 11/01/2013 97  97 - 108 mmol/L Final  . CO2 11/01/2013 25  18 - 29 mmol/L Final  . Calcium 11/01/2013 9.3  8.7 - 10.3 mg/dL Final  . Total Protein 11/01/2013 6.3  6.0 - 8.5 g/dL Final  . Albumin 54/04/8118 4.0  3.5 - 4.7 g/dL Final  . Globulin, Total 11/01/2013 2.3  1.5 - 4.5 g/dL Final  . Albumin/Globulin Ratio 11/01/2013 1.7  1.1 - 2.5 Final  . Total Bilirubin 11/01/2013 0.4  0.0 - 1.2 mg/dL Final  . Alkaline Phosphatase 11/01/2013 112  39 - 117 IU/L Final  . AST 11/01/2013 20  0 - 40 IU/L Final  . ALT 11/01/2013 16  0 - 32 IU/L Final  .  Cholesterol, Total 11/01/2013 123  100 - 199 mg/dL Final  . Triglycerides 11/01/2013 97  0 - 149 mg/dL Final  . HDL 14/78/2956 48  >39 mg/dL Final   Comment: According to ATP-III Guidelines, HDL-C >59 mg/dL is considered a                          negative risk factor for CHD.  Marland Kitchen  VLDL Cholesterol Cal 11/01/2013 19  5 - 40 mg/dL Final  . LDL Calculated 11/01/2013 56  0 - 99 mg/dL Final  . Chol/HDL Ratio 11/01/2013 2.6  0.0 - 4.4 ratio units Final   Comment:                                   T. Chol/HDL Ratio                                                                      Men  Women                                                        1/2 Avg.Risk  3.4    3.3                                                            Avg.Risk  5.0    4.4                                                         2X Avg.Risk  9.6    7.1                                                         3X Avg.Risk 23.4   11.0  . TSH 11/01/2013 4.240  0.450 - 4.500 uIU/mL Final      Assessment/Plan  1. Unspecified essential hypertension Keep list of BP at home - Comprehensive metabolic panel; Future  2. Other and unspecified hyperlipidemia - Lipid panel; Future  3. Unspecified hypothyroidism - TSH; Future  4. Insomnia, unspecified Continue current meds  5. Lumbago stable

## 2013-11-20 DIAGNOSIS — H353290 Exudative age-related macular degeneration, unspecified eye, stage unspecified: Secondary | ICD-10-CM | POA: Diagnosis not present

## 2013-11-20 DIAGNOSIS — H35059 Retinal neovascularization, unspecified, unspecified eye: Secondary | ICD-10-CM | POA: Diagnosis not present

## 2013-11-25 ENCOUNTER — Ambulatory Visit (INDEPENDENT_AMBULATORY_CARE_PROVIDER_SITE_OTHER): Payer: Medicare Other | Admitting: Internal Medicine

## 2013-11-25 VITALS — BP 140/82 | HR 68 | Wt 132.8 lb

## 2013-11-25 DIAGNOSIS — I10 Essential (primary) hypertension: Secondary | ICD-10-CM

## 2013-11-25 NOTE — Progress Notes (Signed)
Patient ID: [name], female   DOB: [birth year], 80 y.o.   MRN: [id number] Pt was seen for BP check.  BP at goal with current medications--goal is <150/90 due to her age.  No changes were made.  Keep f/u with Dr. Chilton SiGreen.

## 2013-11-25 NOTE — Patient Instructions (Signed)
Per Dr.Reed patient's goal is < 150/90, no change in medications, keep scheduled follow-up's with PCP

## 2013-12-01 ENCOUNTER — Other Ambulatory Visit: Payer: Self-pay | Admitting: Internal Medicine

## 2013-12-02 DIAGNOSIS — H353290 Exudative age-related macular degeneration, unspecified eye, stage unspecified: Secondary | ICD-10-CM | POA: Diagnosis not present

## 2013-12-02 DIAGNOSIS — H35059 Retinal neovascularization, unspecified, unspecified eye: Secondary | ICD-10-CM | POA: Diagnosis not present

## 2013-12-04 ENCOUNTER — Encounter: Payer: Self-pay | Admitting: Internal Medicine

## 2014-01-01 DIAGNOSIS — H353290 Exudative age-related macular degeneration, unspecified eye, stage unspecified: Secondary | ICD-10-CM | POA: Diagnosis not present

## 2014-01-01 DIAGNOSIS — H35359 Cystoid macular degeneration, unspecified eye: Secondary | ICD-10-CM | POA: Diagnosis not present

## 2014-01-01 DIAGNOSIS — H35059 Retinal neovascularization, unspecified, unspecified eye: Secondary | ICD-10-CM | POA: Diagnosis not present

## 2014-01-08 DIAGNOSIS — H35059 Retinal neovascularization, unspecified, unspecified eye: Secondary | ICD-10-CM | POA: Diagnosis not present

## 2014-01-08 DIAGNOSIS — H353290 Exudative age-related macular degeneration, unspecified eye, stage unspecified: Secondary | ICD-10-CM | POA: Diagnosis not present

## 2014-02-01 ENCOUNTER — Other Ambulatory Visit: Payer: Self-pay | Admitting: Internal Medicine

## 2014-02-05 DIAGNOSIS — H353290 Exudative age-related macular degeneration, unspecified eye, stage unspecified: Secondary | ICD-10-CM | POA: Diagnosis not present

## 2014-02-05 DIAGNOSIS — H35059 Retinal neovascularization, unspecified, unspecified eye: Secondary | ICD-10-CM | POA: Diagnosis not present

## 2014-02-09 ENCOUNTER — Other Ambulatory Visit: Payer: Self-pay | Admitting: Internal Medicine

## 2014-02-20 ENCOUNTER — Other Ambulatory Visit: Payer: Self-pay | Admitting: Internal Medicine

## 2014-02-27 ENCOUNTER — Other Ambulatory Visit: Payer: Self-pay | Admitting: Internal Medicine

## 2014-03-07 DIAGNOSIS — H353290 Exudative age-related macular degeneration, unspecified eye, stage unspecified: Secondary | ICD-10-CM | POA: Diagnosis not present

## 2014-03-07 DIAGNOSIS — H35059 Retinal neovascularization, unspecified, unspecified eye: Secondary | ICD-10-CM | POA: Diagnosis not present

## 2014-03-11 DIAGNOSIS — L820 Inflamed seborrheic keratosis: Secondary | ICD-10-CM | POA: Diagnosis not present

## 2014-03-11 DIAGNOSIS — L821 Other seborrheic keratosis: Secondary | ICD-10-CM | POA: Diagnosis not present

## 2014-03-13 DIAGNOSIS — H353290 Exudative age-related macular degeneration, unspecified eye, stage unspecified: Secondary | ICD-10-CM | POA: Diagnosis not present

## 2014-03-13 DIAGNOSIS — H35059 Retinal neovascularization, unspecified, unspecified eye: Secondary | ICD-10-CM | POA: Diagnosis not present

## 2014-03-27 ENCOUNTER — Other Ambulatory Visit: Payer: Self-pay | Admitting: Internal Medicine

## 2014-04-01 ENCOUNTER — Other Ambulatory Visit: Payer: Self-pay

## 2014-04-01 DIAGNOSIS — Z1231 Encounter for screening mammogram for malignant neoplasm of breast: Secondary | ICD-10-CM

## 2014-04-14 DIAGNOSIS — H35059 Retinal neovascularization, unspecified, unspecified eye: Secondary | ICD-10-CM | POA: Diagnosis not present

## 2014-04-14 DIAGNOSIS — H353290 Exudative age-related macular degeneration, unspecified eye, stage unspecified: Secondary | ICD-10-CM | POA: Diagnosis not present

## 2014-04-22 ENCOUNTER — Ambulatory Visit
Admission: RE | Admit: 2014-04-22 | Discharge: 2014-04-22 | Disposition: A | Discharge status: Home / Self Care | Payer: Medicare Other | Source: Ambulatory Visit

## 2014-04-22 DIAGNOSIS — Z1231 Encounter for screening mammogram for malignant neoplasm of breast: Secondary | ICD-10-CM | POA: Diagnosis not present

## 2014-04-24 DIAGNOSIS — H353290 Exudative age-related macular degeneration, unspecified eye, stage unspecified: Secondary | ICD-10-CM | POA: Diagnosis not present

## 2014-04-24 DIAGNOSIS — H35059 Retinal neovascularization, unspecified, unspecified eye: Secondary | ICD-10-CM | POA: Diagnosis not present

## 2014-04-27 ENCOUNTER — Other Ambulatory Visit: Payer: Self-pay | Admitting: Internal Medicine

## 2014-04-28 ENCOUNTER — Other Ambulatory Visit: Payer: Medicare Other

## 2014-04-28 DIAGNOSIS — E039 Hypothyroidism, unspecified: Secondary | ICD-10-CM | POA: Diagnosis not present

## 2014-04-28 DIAGNOSIS — E785 Hyperlipidemia, unspecified: Secondary | ICD-10-CM

## 2014-04-28 DIAGNOSIS — I10 Essential (primary) hypertension: Secondary | ICD-10-CM | POA: Diagnosis not present

## 2014-04-29 LAB — LIPID PANEL
CHOL/HDL RATIO: 2.6 ratio (ref 0.0–4.4)
Cholesterol, Total: 135 mg/dL (ref 100–199)
HDL: 51 mg/dL (ref >39)
LDL Calculated: 66 mg/dL (ref 0–99)
Triglycerides: 90 mg/dL (ref 0–149)
VLDL CHOLESTEROL CAL: 18 mg/dL (ref 5–40)

## 2014-04-29 LAB — COMPREHENSIVE METABOLIC PANEL
A/G RATIO: 1.7 (ref 1.1–2.5)
ALBUMIN: 3.8 g/dL (ref 3.5–4.7)
ALT: 11 IU/L (ref 0–32)
AST: 19 IU/L (ref 0–40)
Alkaline Phosphatase: 100 IU/L (ref 39–117)
BUN/Creatinine Ratio: 16 (ref 11–26)
BUN: 14 mg/dL (ref 8–27)
CALCIUM: 9.7 mg/dL (ref 8.7–10.3)
CO2: 29 mmol/L (ref 18–29)
Chloride: 92 mmol/L — ABNORMAL LOW (ref 97–108)
Creatinine, Ser: 0.85 mg/dL (ref 0.57–1.00)
GFR calc Af Amer: 74 mL/min/{1.73_m2} (ref >59)
GFR, EST NON AFRICAN AMERICAN: 64 mL/min/{1.73_m2} (ref >59)
GLUCOSE: 96 mg/dL (ref 65–99)
Globulin, Total: 2.3 g/dL (ref 1.5–4.5)
Potassium: 4 mmol/L (ref 3.5–5.2)
Sodium: 136 mmol/L (ref 134–144)
TOTAL PROTEIN: 6.1 g/dL (ref 6.0–8.5)
Total Bilirubin: 0.6 mg/dL (ref 0.0–1.2)

## 2014-04-29 LAB — TSH: TSH: 2.86 u[IU]/mL (ref 0.450–4.500)

## 2014-04-30 ENCOUNTER — Ambulatory Visit (INDEPENDENT_AMBULATORY_CARE_PROVIDER_SITE_OTHER): Payer: Medicare Other | Admitting: Internal Medicine

## 2014-04-30 DIAGNOSIS — E039 Hypothyroidism, unspecified: Secondary | ICD-10-CM

## 2014-04-30 DIAGNOSIS — E785 Hyperlipidemia, unspecified: Secondary | ICD-10-CM

## 2014-04-30 DIAGNOSIS — I10 Essential (primary) hypertension: Secondary | ICD-10-CM

## 2014-04-30 DIAGNOSIS — Z23 Encounter for immunization: Secondary | ICD-10-CM | POA: Diagnosis not present

## 2014-04-30 DIAGNOSIS — G609 Hereditary and idiopathic neuropathy, unspecified: Secondary | ICD-10-CM | POA: Diagnosis not present

## 2014-04-30 NOTE — Progress Notes (Signed)
Patient ID: [name], female   DOB: 1931/05/28, 80 y.o.   MRN: [id number]    Location:    [facility]  Place of Service:  OFFICE    Allergies  Allergen Reactions  . Ambien [Zolpidem Tartrate]     hallucination  . Bactrim [Sulfamethoxazole-Trimethoprim]   . Morphine And Related Nausea And Vomiting  . Other     z pack     Chief Complaint  Patient presents with  . Medical Management of Chronic Issues    HPI:  Unspecified essential hypertension: mild elevation in SBP. No headache or dizziness  Other and unspecified hyperlipidemia: controlled  Unspecified hypothyroidism: compensated  Unspecified hereditary and idiopathic peripheral neuropathy: numb at times    Medications: Patient's Medications  New Prescriptions   No medications on file  Previous Medications   ASPIRIN 81 MG TABLET    Take 81 mg by mouth daily. Take one tablet once a day   ATORVASTATIN (LIPITOR) 20 MG TABLET    TAKE 1 TABLET BY MOUTH EVERY DAY TO LOWER CHOLESTEROL   BEVACIZUMAB (AVASTIN) 100 MG/4ML SOLN    Inject monthly in right eye every 6 weeks.   CHLORPHENIRAMINE-HYDROCODONE (TUSSIONEX PENNKINETIC ER) 10-8 MG/5ML LQCR    Take 5 mLs by mouth every 6 (six) hours as needed (cough). Take one tsp every 12 hours as needed for cough   DOXAZOSIN (CARDURA) 4 MG TABLET    TAKE 1 TABLET BY MOUTH DAILY   INDAPAMIDE (LOZOL) 1.25 MG TABLET    TAKE 1 TABLET BY MOUTH DAILY   LEVOTHYROXINE (SYNTHROID, LEVOTHROID) 75 MCG TABLET    TAKE 1 TABLET BY MOUTH EVERY DAY FOR THYROID SUPPLEMENT   LORATADINE (CLARITIN) 10 MG TABLET    Take 10 mg by mouth daily. Take one tablet once a day as needed for allergies   METOPROLOL SUCCINATE (TOPROL-XL) 50 MG 24 HR TABLET    TAKE 1 TABLET BY MOUTH EVERY DAY   OMEPRAZOLE (PRILOSEC) 20 MG CAPSULE    TAKE 1 TABLET BY MOUTH DAILY FOR REFLUX   POLYETHYLENE GLYCOL (MIRALAX / GLYCOLAX) PACKET    Take 17 g by mouth daily. Mix 17 grams in a 8 oz glass for constipation needed   RANIBIZUMAB  (LUCENTIS) 0.3 MG/0.05ML SOLN    Inject into left eye every 5 weeks.   TEMAZEPAM (RESTORIL) 30 MG CAPSULE    TAKE 1 CAPSULE BY MOUTH AT BEDTIME AS NEEDED FOR SLEEP  Modified Medications   No medications on file  Discontinued Medications   ACETAMINOPHEN (TYLENOL) 500 MG TABLET    Take 500 mg by mouth every 6 (six) hours as needed for pain. Take 1-2 tablets every 8 hours as needed for pain   MULTIPLE VITAMINS-MINERALS (PRESERVISION AREDS) TABS    Take by mouth. Take one tablet twice a day for vision     Review of Systems  Constitutional: Negative.   Eyes: Positive for visual disturbance.       Macular degeneration.  Respiratory: Negative for cough, chest tightness, shortness of breath and wheezing.        History of asthma.  Cardiovascular: Positive for palpitations. Negative for chest pain and leg swelling.  Gastrointestinal: Positive for constipation.       History of hemorrhoids.  Endocrine:       History hypothyroidism  Genitourinary: Negative for urgency, frequency, hematuria and flank pain.       History kidney stones at age 50.  Musculoskeletal: Positive for back pain and myalgias.  Skin: Negative.  Allergic/Immunologic: Negative.   Neurological: Negative.   Hematological: Negative.   Psychiatric/Behavioral: Negative.     There were no vitals filed for this visit. There is no weight on file to calculate BMI.  Physical Exam  Constitutional: She is oriented to person, place, and time. She appears well-nourished. No distress.  HENT:  Head: Normocephalic and atraumatic.  Right Ear: External ear normal.  Left Ear: External ear normal.  Nose: Nose normal.  Mouth/Throat: Oropharynx is clear and moist.  Partial hearing loss in both ears.  Eyes: Conjunctivae and EOM are normal. Pupils are equal, round, and reactive to light.  Corrective lenses.  Cardiovascular: Normal rate, regular rhythm, normal heart sounds and intact distal pulses.  Exam reveals no gallop and no friction  rub.   No murmur heard. Pulmonary/Chest: No respiratory distress. She has no wheezes. She has no rales.  Abdominal: Bowel sounds are normal. She exhibits no distension and no mass. There is no tenderness.  Musculoskeletal: Normal range of motion. She exhibits no edema and no tenderness.  Neurological: She is alert and oriented to person, place, and time. She has normal reflexes. No cranial nerve deficit. Coordination normal.  Skin: No rash noted. No erythema. No pallor.  Multiple seborrheic keratoses.  Psychiatric: She has a normal mood and affect. Her behavior is normal. Judgment and thought content normal.     Labs reviewed: Appointment on 04/28/2014  Component Date Value Ref Range Status  . TSH 04/28/2014 2.860  0.450 - 4.500 uIU/mL Final  . Glucose 04/28/2014 96  65 - 99 mg/dL Final  . BUN 40/98/1191 14  8 - 27 mg/dL Final  . Creatinine, Ser 04/28/2014 0.85  0.57 - 1.00 mg/dL Final  . GFR calc non Af Amer 04/28/2014 64  >59 mL/min/1.73 Final  . GFR calc Af Amer 04/28/2014 74  >59 mL/min/1.73 Final  . BUN/Creatinine Ratio 04/28/2014 16  11 - 26 Final  . Sodium 04/28/2014 136  134 - 144 mmol/L Final  . Potassium 04/28/2014 4.0  3.5 - 5.2 mmol/L Final  . Chloride 04/28/2014 92* 97 - 108 mmol/L Final  . CO2 04/28/2014 29  18 - 29 mmol/L Final  . Calcium 04/28/2014 9.7  8.7 - 10.3 mg/dL Final  . Total Protein 04/28/2014 6.1  6.0 - 8.5 g/dL Final  . Albumin 47/82/9562 3.8  3.5 - 4.7 g/dL Final  . Globulin, Total 04/28/2014 2.3  1.5 - 4.5 g/dL Final  . Albumin/Globulin Ratio 04/28/2014 1.7  1.1 - 2.5 Final  . Total Bilirubin 04/28/2014 0.6  0.0 - 1.2 mg/dL Final  . Alkaline Phosphatase 04/28/2014 100  39 - 117 IU/L Final  . AST 04/28/2014 19  0 - 40 IU/L Final  . ALT 04/28/2014 11  0 - 32 IU/L Final  . Cholesterol, Total 04/28/2014 135  100 - 199 mg/dL Final  . Triglycerides 04/28/2014 90  0 - 149 mg/dL Final  . HDL 13/03/6577 51  >39 mg/dL Final   Comment: According to ATP-III  Guidelines, HDL-C >59 mg/dL is considered a                          negative risk factor for CHD.  Marland Kitchen VLDL Cholesterol Cal 04/28/2014 18  5 - 40 mg/dL Final  . LDL Calculated 04/28/2014 66  0 - 99 mg/dL Final  . Chol/HDL Ratio 04/28/2014 2.6  0.0 - 4.4 ratio units Final   Comment:  T. Chol/HDL Ratio                                                                      Men  Women                                                        1/2 Avg.Risk  3.4    3.3                                                            Avg.Risk  5.0    4.4                                                         2X Avg.Risk  9.6    7.1                                                         3X Avg.Risk 23.4   11.0    Mm Screening Breast Tomo Bilateral  04/22/2014   CLINICAL DATA:  Screening.  EXAM: DIGITAL SCREENING BILATERAL MAMMOGRAM WITH 3D TOMO WITH CAD  COMPARISON:  Previous exam(s).  ACR Breast Density Category c: The breast tissue is heterogeneously dense, which may obscure small masses.  FINDINGS: There are no findings suspicious for malignancy. Images were processed with CAD.  IMPRESSION: No mammographic evidence of malignancy. A result letter of this screening mammogram will be mailed directly to the patient.  RECOMMENDATION: Screening mammogram in one year. (Code:SM-B-01Y)  BI-RADS CATEGORY  1: Negative.   Electronically Signed   By: Hulan Saas M.D.   On: 04/22/2014 14:05     Assessment/Plan

## 2014-05-19 DIAGNOSIS — H3532 Exudative age-related macular degeneration: Secondary | ICD-10-CM | POA: Diagnosis not present

## 2014-05-19 DIAGNOSIS — H35352 Cystoid macular degeneration, left eye: Secondary | ICD-10-CM | POA: Diagnosis not present

## 2014-05-22 ENCOUNTER — Other Ambulatory Visit: Payer: Self-pay | Admitting: Internal Medicine

## 2014-06-05 DIAGNOSIS — H3532 Exudative age-related macular degeneration: Secondary | ICD-10-CM | POA: Diagnosis not present

## 2014-06-05 DIAGNOSIS — H35051 Retinal neovascularization, unspecified, right eye: Secondary | ICD-10-CM | POA: Diagnosis not present

## 2014-06-08 ENCOUNTER — Other Ambulatory Visit: Payer: Self-pay | Admitting: Internal Medicine

## 2014-06-23 DIAGNOSIS — H35352 Cystoid macular degeneration, left eye: Secondary | ICD-10-CM | POA: Diagnosis not present

## 2014-06-23 DIAGNOSIS — H3532 Exudative age-related macular degeneration: Secondary | ICD-10-CM | POA: Diagnosis not present

## 2014-07-17 DIAGNOSIS — H35051 Retinal neovascularization, unspecified, right eye: Secondary | ICD-10-CM | POA: Diagnosis not present

## 2014-07-17 DIAGNOSIS — H3532 Exudative age-related macular degeneration: Secondary | ICD-10-CM | POA: Diagnosis not present

## 2014-08-01 ENCOUNTER — Other Ambulatory Visit: Payer: Self-pay | Admitting: Internal Medicine

## 2014-08-04 DIAGNOSIS — H3532 Exudative age-related macular degeneration: Secondary | ICD-10-CM | POA: Diagnosis not present

## 2014-08-04 DIAGNOSIS — H35052 Retinal neovascularization, unspecified, left eye: Secondary | ICD-10-CM | POA: Diagnosis not present

## 2014-08-28 DIAGNOSIS — H35051 Retinal neovascularization, unspecified, right eye: Secondary | ICD-10-CM | POA: Diagnosis not present

## 2014-08-28 DIAGNOSIS — H3532 Exudative age-related macular degeneration: Secondary | ICD-10-CM | POA: Diagnosis not present

## 2014-09-06 ENCOUNTER — Other Ambulatory Visit: Payer: Self-pay | Admitting: Internal Medicine

## 2014-09-08 DIAGNOSIS — H35352 Cystoid macular degeneration, left eye: Secondary | ICD-10-CM | POA: Diagnosis not present

## 2014-09-08 DIAGNOSIS — H35052 Retinal neovascularization, unspecified, left eye: Secondary | ICD-10-CM | POA: Diagnosis not present

## 2014-09-08 DIAGNOSIS — H3532 Exudative age-related macular degeneration: Secondary | ICD-10-CM | POA: Diagnosis not present

## 2014-09-24 ENCOUNTER — Other Ambulatory Visit: Payer: Self-pay | Admitting: *Deleted

## 2014-09-24 MED ORDER — METOPROLOL SUCCINATE ER 50 MG PO TB24: ORAL_TABLET | ORAL | Status: DC

## 2014-09-24 NOTE — Telephone Encounter (Signed)
[facility] 

## 2014-10-08 DIAGNOSIS — H6123 Impacted cerumen, bilateral: Secondary | ICD-10-CM | POA: Diagnosis not present

## 2014-10-09 DIAGNOSIS — H3532 Exudative age-related macular degeneration: Secondary | ICD-10-CM | POA: Diagnosis not present

## 2014-10-09 DIAGNOSIS — H35051 Retinal neovascularization, unspecified, right eye: Secondary | ICD-10-CM | POA: Diagnosis not present

## 2014-10-14 DIAGNOSIS — H3532 Exudative age-related macular degeneration: Secondary | ICD-10-CM | POA: Diagnosis not present

## 2014-10-14 DIAGNOSIS — H35052 Retinal neovascularization, unspecified, left eye: Secondary | ICD-10-CM | POA: Diagnosis not present

## 2014-10-24 ENCOUNTER — Other Ambulatory Visit: Payer: Medicare Other

## 2014-10-24 DIAGNOSIS — I10 Essential (primary) hypertension: Secondary | ICD-10-CM | POA: Diagnosis not present

## 2014-10-24 DIAGNOSIS — E039 Hypothyroidism, unspecified: Secondary | ICD-10-CM | POA: Diagnosis not present

## 2014-10-24 DIAGNOSIS — E785 Hyperlipidemia, unspecified: Secondary | ICD-10-CM

## 2014-10-25 ENCOUNTER — Other Ambulatory Visit: Payer: Self-pay | Admitting: Internal Medicine

## 2014-10-25 LAB — COMPREHENSIVE METABOLIC PANEL
ALK PHOS: 89 IU/L (ref 39–117)
ALT: 13 IU/L (ref 0–32)
AST: 15 IU/L (ref 0–40)
Albumin/Globulin Ratio: 1.9 (ref 1.1–2.5)
Albumin: 3.6 g/dL (ref 3.5–4.7)
BILIRUBIN TOTAL: 0.4 mg/dL (ref 0.0–1.2)
BUN / CREAT RATIO: 16 (ref 11–26)
BUN: 12 mg/dL (ref 8–27)
CALCIUM: 9.6 mg/dL (ref 8.7–10.3)
CHLORIDE: 94 mmol/L — AB (ref 97–108)
CO2: 29 mmol/L (ref 18–29)
CREATININE: 0.74 mg/dL (ref 0.57–1.00)
GFR, EST AFRICAN AMERICAN: 88 mL/min/{1.73_m2} (ref >59)
GFR, EST NON AFRICAN AMERICAN: 76 mL/min/{1.73_m2} (ref >59)
Globulin, Total: 1.9 g/dL (ref 1.5–4.5)
Glucose: 86 mg/dL (ref 65–99)
Potassium: 4.1 mmol/L (ref 3.5–5.2)
Sodium: 138 mmol/L (ref 134–144)
Total Protein: 5.5 g/dL — ABNORMAL LOW (ref 6.0–8.5)

## 2014-10-25 LAB — LIPID PANEL
Chol/HDL Ratio: 2.8 ratio units (ref 0.0–4.4)
Cholesterol, Total: 127 mg/dL (ref 100–199)
HDL: 46 mg/dL (ref >39)
LDL CALC: 60 mg/dL (ref 0–99)
Triglycerides: 105 mg/dL (ref 0–149)
VLDL Cholesterol Cal: 21 mg/dL (ref 5–40)

## 2014-10-25 LAB — TSH: TSH: 3.93 u[IU]/mL (ref 0.450–4.500)

## 2014-10-28 ENCOUNTER — Encounter: Payer: Self-pay | Admitting: Internal Medicine

## 2014-10-28 ENCOUNTER — Ambulatory Visit (INDEPENDENT_AMBULATORY_CARE_PROVIDER_SITE_OTHER): Payer: Medicare Other | Admitting: Internal Medicine

## 2014-10-28 VITALS — BP 158/60 | HR 66 | Temp 97.8°F | Resp 18 | Ht 63.0 in | Wt 143.6 lb

## 2014-10-28 DIAGNOSIS — G609 Hereditary and idiopathic neuropathy, unspecified: Secondary | ICD-10-CM

## 2014-10-28 DIAGNOSIS — E785 Hyperlipidemia, unspecified: Secondary | ICD-10-CM | POA: Diagnosis not present

## 2014-10-28 DIAGNOSIS — I10 Essential (primary) hypertension: Secondary | ICD-10-CM | POA: Diagnosis not present

## 2014-10-28 DIAGNOSIS — H3530 Unspecified macular degeneration: Secondary | ICD-10-CM

## 2014-10-28 DIAGNOSIS — G4700 Insomnia, unspecified: Secondary | ICD-10-CM

## 2014-10-28 DIAGNOSIS — E039 Hypothyroidism, unspecified: Secondary | ICD-10-CM

## 2014-10-28 NOTE — Progress Notes (Signed)
Did not pass the clock test due to eye sight issues.

## 2014-10-28 NOTE — Progress Notes (Deleted)
Patient ID: [name], female   DOB: March 12, 1931, 81 y.o.   MRN: [id number]    Facility  [facility]    Place of Service:   OFFICE   Allergies  Allergen Reactions  . Ambien [Zolpidem Tartrate]     hallucination  . Bactrim [Sulfamethoxazole-Trimethoprim]   . Morphine And Related Nausea And Vomiting  . Other     z pack     Chief Complaint  Patient presents with  . Annual Exam    HPI:  ***  Medications: Patient's Medications  New Prescriptions   No medications on file  Previous Medications   ASPIRIN 81 MG TABLET    Take 81 mg by mouth. Take one tablet 3 times a week.   ATORVASTATIN (LIPITOR) 20 MG TABLET    TAKE 1 TABLET BY MOUTH EVERY DAY   BEVACIZUMAB (AVASTIN) 100 MG/4ML SOLN    Inject monthly in right eye every 6 weeks.   CHLORPHENIRAMINE-HYDROCODONE (TUSSIONEX PENNKINETIC ER) 10-8 MG/5ML LQCR    Take 5 mLs by mouth every 6 (six) hours as needed (cough). Take one tsp every 12 hours as needed for cough   DOXAZOSIN (CARDURA) 4 MG TABLET    TAKE 1 TABLET BY MOUTH DAILY   INDAPAMIDE (LOZOL) 1.25 MG TABLET    TAKE 1 TABLET BY MOUTH DAILY   LEVOTHYROXINE (SYNTHROID, LEVOTHROID) 75 MCG TABLET    TAKE 1 TABLET BY MOUTH EVERY DAY FOR THYROID SUPPLEMENT   LORATADINE (CLARITIN) 10 MG TABLET    Take 10 mg by mouth daily. Take one tablet once a day as needed for allergies   METOPROLOL SUCCINATE (TOPROL-XL) 50 MG 24 HR TABLET    Take one tablet by mouth once daily with meal to control blood pressure   OMEPRAZOLE (PRILOSEC) 20 MG CAPSULE    TAKE 1 TABLET BY MOUTH DAILY FOR REFLUX   POLYETHYLENE GLYCOL (MIRALAX / GLYCOLAX) PACKET    Take 17 g by mouth daily. Mix 17 grams in a 8 oz glass for constipation needed   RANIBIZUMAB (LUCENTIS) 0.3 MG/0.05ML SOLN    Inject into left eye every 5 weeks.   TEMAZEPAM (RESTORIL) 30 MG CAPSULE    TAKE ONE CAPSULE BY MOUTH AT BEDTIME AS NEEDED FOR SLEEP  Modified Medications   No medications on file  Discontinued Medications   No medications on file      Review of Systems  Filed Vitals:   10/28/14 1425  BP: 158/60  Pulse: 66  Temp: 97.8 F (36.6 C)  TempSrc: Oral  Resp: 18  Height: 5\' 3"  (1.6 m)  Weight: 143 lb 9.6 oz (65.137 kg)  SpO2: 92%   Body mass index is 25.44 kg/(m^2).  Physical Exam   Labs reviewed: Appointment on 10/24/2014  Component Date Value Ref Range Status  . Cholesterol, Total 10/24/2014 127  100 - 199 mg/dL Final  . Triglycerides 10/24/2014 105  0 - 149 mg/dL Final  . HDL 56/21/308603/06/2015 46  >39 mg/dL Final   Comment: According to ATP-III Guidelines, HDL-C >59 mg/dL is considered a negative risk factor for CHD.   Marland Kitchen. VLDL Cholesterol Cal 10/24/2014 21  5 - 40 mg/dL Final  . LDL Calculated 10/24/2014 60  0 - 99 mg/dL Final  . Chol/HDL Ratio 10/24/2014 2.8  0.0 - 4.4 ratio units Final   Comment:  T. Chol/HDL Ratio                                             Men  Women                               1/2 Avg.Risk  3.4    3.3                                   Avg.Risk  5.0    4.4                                2X Avg.Risk  9.6    7.1                                3X Avg.Risk 23.4   11.0   . Glucose 10/24/2014 86  65 - 99 mg/dL Final  . BUN 16/05/9603 12  8 - 27 mg/dL Final  . Creatinine, Ser 10/24/2014 0.74  0.57 - 1.00 mg/dL Final  . GFR calc non Af Amer 10/24/2014 76  >59 mL/min/1.73 Final  . GFR calc Af Amer 10/24/2014 88  >59 mL/min/1.73 Final  . BUN/Creatinine Ratio 10/24/2014 16  11 - 26 Final  . Sodium 10/24/2014 138  134 - 144 mmol/L Final  . Potassium 10/24/2014 4.1  3.5 - 5.2 mmol/L Final  . Chloride 10/24/2014 94* 97 - 108 mmol/L Final  . CO2 10/24/2014 29  18 - 29 mmol/L Final  . Calcium 10/24/2014 9.6  8.7 - 10.3 mg/dL Final  . Total Protein 10/24/2014 5.5* 6.0 - 8.5 g/dL Final  . Albumin 54/04/8118 3.6  3.5 - 4.7 g/dL Final  . Globulin, Total 10/24/2014 1.9  1.5 - 4.5 g/dL Final  . Albumin/Globulin Ratio 10/24/2014 1.9  1.1 - 2.5 Final  . Bilirubin Total  10/24/2014 0.4  0.0 - 1.2 mg/dL Final  . Alkaline Phosphatase 10/24/2014 89  39 - 117 IU/L Final  . AST 10/24/2014 15  0 - 40 IU/L Final  . ALT 10/24/2014 13  0 - 32 IU/L Final  . TSH 10/24/2014 3.930  0.450 - 4.500 uIU/mL Final     Assessment/Plan

## 2014-10-28 NOTE — Progress Notes (Signed)
Patient ID: [name], female   DOB: 1931-02-18, 81 y.o.   MRN: [id number]    HISTORY AND PHYSICAL  Location:    [facility]   Place of Service:   OFFICE  Extended Emergency Contact Information Primary Emergency Contact: [facility] Address: [address]          [place],  [phone] Home Phone: [phone] Relation: None  Advanced Directive information Does patient have an advance directive?: Yes, Type of Advance Directive: Healthcare Power of [place];Living will, Does patient want to make changes to advanced directive?: No - Patient declined  Chief Complaint  Patient presents with  . Annual Exam    HPI:  Essential hypertension - elevated SBP on today's visit. Home blood pressures tend to run normal.  Hyperlipidemia - controlled  Hypothyroidism, unspecified hypothyroidism type - compensated  Hereditary and idiopathic peripheral neuropathy-he continued to feel numb  Macular degeneration (senile) of retina - vision is mildly impaired  Insomnia: Improved on current medications    Past Medical History  Diagnosis Date  . Lumbago   . Personal history of fall   . Anxiety state, unspecified   . Unspecified constipation   . Open wound of toe(s), without mention of complication   . Allergic rhinitis due to pollen   . Unspecified tinnitus   . Insomnia, unspecified   . Unspecified hereditary and idiopathic peripheral neuropathy   . Insomnia, unspecified   . Diaphragmatic hernia without mention of obstruction or gangrene   . Sebaceous cyst   . Dizziness and giddiness   . Diffuse cystic mastopathy   . Osteoarthrosis, unspecified whether generalized or localized, unspecified site   . Unspecified hypothyroidism   . Other and unspecified hyperlipidemia   . Unspecified essential hypertension   . Sciatica   . Macular degeneration (senile) of retina, unspecified   . Senile osteoporosis   . Calculus of kidney   . Cataract     Past Surgical History  Procedure Laterality  Date  . Cholecystectomy  07/25/2007  . Tonsillectomy and adenoidectomy Bilateral 1943    Patient Care Team: Kimber Relic, MD as PCP - General (Internal Medicine) Edmon Crape, MD as Consulting Physician (Ophthalmology) Donzetta Starch, MD as Consulting Physician (Dermatology)  History   Social History  . Marital Status: Married    Spouse Name: N/A  . Number of Children: N/A  . Years of Education: N/A   Occupational History  . Not on file.   Social History Main Topics  . Smoking status: Never Smoker   . Smokeless tobacco: Not on file  . Alcohol Use: No  . Drug Use: No  . Sexual Activity: No   Other Topics Concern  . Not on file   Social History Narrative     reports that she has never smoked. She does not have any smokeless tobacco history on file. She reports that she does not drink alcohol or use illicit drugs.  Family History  Problem Relation Age of Onset  . Hypertension Brother   . Fibromyalgia Daughter   . Hypertension Son   . Hypertension Brother   . Hyperlipidemia Brother    Family Status  Relation Status Death Age  . Mother Deceased 65    CAD 01-08-2009  . Father Deceased 76    coronary thrombosis  . Brother Alive   . Daughter Alive   . Son Alive   . Brother Biomedical engineer History  Administered Date(s) Administered  . Influenza,inj,Quad PF,36+ Mos 05/27/2013, 04/30/2014  .  Influenza-Unspecified 05/04/2011  . Pneumococcal-Unspecified 08/16/1999  . Td 12/07/2005    Allergies  Allergen Reactions  . Ambien [Zolpidem Tartrate]     hallucination  . Bactrim [Sulfamethoxazole-Trimethoprim]   . Morphine And Related Nausea And Vomiting  . Other     z pack     Medications: Patient's Medications  New Prescriptions   No medications on file  Previous Medications   ASPIRIN 81 MG TABLET    Take 81 mg by mouth. Take one tablet 3 times a week.   ATORVASTATIN (LIPITOR) 20 MG TABLET    TAKE 1 TABLET BY MOUTH EVERY DAY   BEVACIZUMAB (AVASTIN) 100  MG/4ML SOLN    Inject monthly in right eye every 6 weeks.   CHLORPHENIRAMINE-HYDROCODONE (TUSSIONEX PENNKINETIC ER) 10-8 MG/5ML LQCR    Take 5 mLs by mouth every 6 (six) hours as needed (cough). Take one tsp every 12 hours as needed for cough   DOXAZOSIN (CARDURA) 4 MG TABLET    TAKE 1 TABLET BY MOUTH DAILY   INDAPAMIDE (LOZOL) 1.25 MG TABLET    TAKE 1 TABLET BY MOUTH DAILY   LEVOTHYROXINE (SYNTHROID, LEVOTHROID) 75 MCG TABLET    TAKE 1 TABLET BY MOUTH EVERY DAY FOR THYROID SUPPLEMENT   LORATADINE (CLARITIN) 10 MG TABLET    Take 10 mg by mouth daily. Take one tablet once a day as needed for allergies   METOPROLOL SUCCINATE (TOPROL-XL) 50 MG 24 HR TABLET    Take one tablet by mouth once daily with meal to control blood pressure   OMEPRAZOLE (PRILOSEC) 20 MG CAPSULE    TAKE 1 TABLET BY MOUTH DAILY FOR REFLUX   POLYETHYLENE GLYCOL (MIRALAX / GLYCOLAX) PACKET    Take 17 g by mouth daily. Mix 17 grams in a 8 oz glass for constipation needed   RANIBIZUMAB (LUCENTIS) 0.3 MG/0.05ML SOLN    Inject into left eye every 5 weeks.   TEMAZEPAM (RESTORIL) 30 MG CAPSULE    TAKE ONE CAPSULE BY MOUTH AT BEDTIME AS NEEDED FOR SLEEP  Modified Medications   No medications on file  Discontinued Medications   No medications on file    Review of Systems  Constitutional: Negative.   Eyes: Positive for visual disturbance.       Macular degeneration.  Respiratory: Negative for cough, chest tightness, shortness of breath and wheezing.        History of asthma.  Cardiovascular: Positive for palpitations. Negative for chest pain and leg swelling.  Gastrointestinal: Positive for constipation.       History of hemorrhoids.  Endocrine:       History hypothyroidism  Genitourinary: Negative for urgency, frequency, hematuria and flank pain.       History kidney stones at age 81.  Musculoskeletal: Negative for myalgias and back pain.  Skin: Negative.   Allergic/Immunologic: Negative.   Neurological: Negative.     Hematological: Negative.   Psychiatric/Behavioral: Negative.     Filed Vitals:   10/28/14 1425  BP: 158/60  Pulse: 66  Temp: 97.8 F (36.6 C)  TempSrc: Oral  Resp: 18  Height: 5\' 3"  (1.6 m)  Weight: 143 lb 9.6 oz (65.137 kg)  SpO2: 92%   Body mass index is 25.44 kg/(m^2).  Physical Exam  Constitutional: She is oriented to person, place, and time. She appears well-nourished. No distress.  HENT:  Head: Normocephalic and atraumatic.  Right Ear: External ear normal.  Left Ear: External ear normal.  Nose: Nose normal.  Mouth/Throat: Oropharynx is clear and moist.  Partial hearing loss in both ears.  Eyes: Conjunctivae and EOM are normal. Pupils are equal, round, and reactive to light.  Corrective lenses.  Cardiovascular: Normal rate, regular rhythm, normal heart sounds and intact distal pulses.  Exam reveals no gallop and no friction rub.   No murmur heard. Pulmonary/Chest: No respiratory distress. She has no wheezes. She has no rales.  Abdominal: Bowel sounds are normal. She exhibits no distension and no mass. There is no tenderness.  Musculoskeletal: Normal range of motion. She exhibits no edema or tenderness.  Neurological: She is alert and oriented to person, place, and time. She has normal reflexes. No cranial nerve deficit. Coordination normal.  10/28/14 MMSE 29/30. Passed clock drawing.  Skin: No rash noted. No erythema. No pallor.  Multiple seborrheic keratoses.  Psychiatric: She has a normal mood and affect. Her behavior is normal. Judgment and thought content normal.     Labs reviewed: Appointment on 10/24/2014  Component Date Value Ref Range Status  . Cholesterol, Total 10/24/2014 127  100 - 199 mg/dL Final  . Triglycerides 10/24/2014 105  0 - 149 mg/dL Final  . HDL 45/40/9811 46  >39 mg/dL Final   Comment: According to ATP-III Guidelines, HDL-C >59 mg/dL is considered a negative risk factor for CHD.   Marland Kitchen VLDL Cholesterol Cal 10/24/2014 21  5 - 40 mg/dL Final   . LDL Calculated 10/24/2014 60  0 - 99 mg/dL Final  . Chol/HDL Ratio 10/24/2014 2.8  0.0 - 4.4 ratio units Final   Comment:                                   T. Chol/HDL Ratio                                             Men  Women                               1/2 Avg.Risk  3.4    3.3                                   Avg.Risk  5.0    4.4                                2X Avg.Risk  9.6    7.1                                3X Avg.Risk 23.4   11.0   . Glucose 10/24/2014 86  65 - 99 mg/dL Final  . BUN 91/47/8295 12  8 - 27 mg/dL Final  . Creatinine, Ser 10/24/2014 0.74  0.57 - 1.00 mg/dL Final  . GFR calc non Af Amer 10/24/2014 76  >59 mL/min/1.73 Final  . GFR calc Af Amer 10/24/2014 88  >59 mL/min/1.73 Final  . BUN/Creatinine Ratio 10/24/2014 16  11 - 26 Final  . Sodium 10/24/2014 138  134 - 144 mmol/L Final  . Potassium 10/24/2014 4.1  3.5 - 5.2 mmol/L Final  . Chloride 10/24/2014 94* 97 -  108 mmol/L Final  . CO2 10/24/2014 29  18 - 29 mmol/L Final  . Calcium 10/24/2014 9.6  8.7 - 10.3 mg/dL Final  . Total Protein 10/24/2014 5.5* 6.0 - 8.5 g/dL Final  . Albumin 16/05/9603 3.6  3.5 - 4.7 g/dL Final  . Globulin, Total 10/24/2014 1.9  1.5 - 4.5 g/dL Final  . Albumin/Globulin Ratio 10/24/2014 1.9  1.1 - 2.5 Final  . Bilirubin Total 10/24/2014 0.4  0.0 - 1.2 mg/dL Final  . Alkaline Phosphatase 10/24/2014 89  39 - 117 IU/L Final  . AST 10/24/2014 15  0 - 40 IU/L Final  . ALT 10/24/2014 13  0 - 32 IU/L Final  . TSH 10/24/2014 3.930  0.450 - 4.500 uIU/mL Final    10/28/14 EKG: rate 61. NSR.   Assessment/Plan  1. Essential hypertension - Basic metabolic panel; Future  2. Hyperlipidemia - Lipid panel; Future  3. Hypothyroidism, unspecified hypothyroidism type - TSH; Future  4. Hereditary and idiopathic peripheral neuropathy Continue to monitor  5. Macular degeneration (senile) of retina Mild visual impairment. Continue with ophthalmologist.  6. Insomnia Continue with  Restoril

## 2014-11-05 ENCOUNTER — Encounter: Payer: Self-pay | Admitting: Internal Medicine

## 2014-11-05 DIAGNOSIS — K219 Gastro-esophageal reflux disease without esophagitis: Secondary | ICD-10-CM | POA: Insufficient documentation

## 2014-11-14 ENCOUNTER — Encounter: Payer: Self-pay | Admitting: Internal Medicine

## 2014-11-18 DIAGNOSIS — H3532 Exudative age-related macular degeneration: Secondary | ICD-10-CM | POA: Diagnosis not present

## 2014-11-27 DIAGNOSIS — H3532 Exudative age-related macular degeneration: Secondary | ICD-10-CM | POA: Diagnosis not present

## 2014-11-27 DIAGNOSIS — H35051 Retinal neovascularization, unspecified, right eye: Secondary | ICD-10-CM | POA: Diagnosis not present

## 2014-12-07 ENCOUNTER — Other Ambulatory Visit: Payer: Self-pay | Admitting: Internal Medicine

## 2014-12-09 ENCOUNTER — Other Ambulatory Visit: Payer: Self-pay | Admitting: Internal Medicine

## 2014-12-10 ENCOUNTER — Other Ambulatory Visit: Payer: Self-pay | Admitting: Internal Medicine

## 2014-12-18 ENCOUNTER — Other Ambulatory Visit: Payer: Self-pay | Admitting: Internal Medicine

## 2014-12-23 DIAGNOSIS — H3532 Exudative age-related macular degeneration: Secondary | ICD-10-CM | POA: Diagnosis not present

## 2015-01-19 ENCOUNTER — Encounter: Payer: Self-pay | Admitting: Internal Medicine

## 2015-01-19 ENCOUNTER — Ambulatory Visit (INDEPENDENT_AMBULATORY_CARE_PROVIDER_SITE_OTHER): Payer: Medicare Other | Admitting: Internal Medicine

## 2015-01-19 VITALS — BP 120/76 | HR 71 | Temp 98.1°F | Resp 20 | Ht 63.0 in | Wt 130.2 lb

## 2015-01-19 DIAGNOSIS — T148 Other injury of unspecified body region: Secondary | ICD-10-CM | POA: Diagnosis not present

## 2015-01-19 DIAGNOSIS — W57XXXA Bitten or stung by nonvenomous insect and other nonvenomous arthropods, initial encounter: Secondary | ICD-10-CM | POA: Diagnosis not present

## 2015-01-19 MED ORDER — DOXYCYCLINE HYCLATE 100 MG PO TABS: 100.0000 mg | ORAL_TABLET | Freq: Two times a day (BID) | ORAL | Status: DC

## 2015-01-19 NOTE — Patient Instructions (Signed)
We will treat you since you've been bitten 4 times in the past month to try to prevent lyme disease.  You may use camoline lotion on the bites for the itching.  Watch for the target rash.      Lyme Disease You may have been bitten by a tick and are to watch for the development of Lyme Disease. Lyme Disease is an infection that is caused by a bacteria The bacteria causing this disease is named Borreilia burgdorferi. If a tick is infected with this bacteria and then bites you, then Lyme Disease may occur. These ticks are carried by deer and rodents such as rabbits and mice and infest grassy as well as forested areas. Fortunately most tick bites do not cause Lyme Disease.  Lyme Disease is easier to prevent than to treat. First, covering your legs with clothing when walking in areas where ticks are possibly abundant will prevent their attachment because ticks tend to stay within inches of the ground. Second, using insecticides containing DEET can be applied on skin or clothing. Last, because it takes about 12 to 24 hours for the tick to transmit the disease after attachment to the human host, you should inspect your body for ticks twice a day when you are in areas where Lyme Disease is common. You must look thoroughly when searching for ticks. The Ixodes tick that carries Lyme Disease is very small. It is around the size of a sesame seed (picture of tick is not actual size). Removal is best done by grasping the tick by the head and pulling it out. Do not to squeeze the body of the tick. This could inject the infecting bacteria into the bite site. Wash the area of the bite with an antiseptic solution after removal.  Lyme Disease is a disease that may affect many body systems. Because of the small size of the biting tick, most people do not notice being bitten. The first sign of an infection is usually a round red rash that extends out from the center of the tick bite. The center of the lesion may be blood colored  (hemorrhagic) or have tiny blisters (vesicular). Most lesions have bright red outer borders and partial central clearing. This rash may extend out many inches in diameter, and multiple lesions may be present. Other symptoms such as fatigue, headaches, chills and fever, general achiness and swelling of lymph glands may also occur. If this first stage of the disease is left untreated, these symptoms may gradually resolve by themselves, or progressive symptoms may occur because of spread of infection to other areas of the body.  Follow up with your caregiver to have testing and treatment if you have a tick bite and you develop any of the above complaints. Your caregiver may recommend preventative (prophylactic) medications which kill bacteria (antibiotics). Once a diagnosis of Lyme Disease is made, antibiotic treatment is highly likely to cure the disease. Effective treatment of late stage Lyme Disease may require longer courses of antibiotic therapy.  MAKE SURE YOU:   Understand these instructions.  Will watch your condition.  Will get help right away if you are not doing well or get worse. Document Released: 11/07/2000 Document Revised: 10/24/2011 Document Reviewed: 01/09/2009 [facility] Patient Information 2015 [employer] [place]. This information is not intended to replace advice given to you by your health care provider. Make sure you discuss any questions you have with your health care provider.

## 2015-01-19 NOTE — Progress Notes (Signed)
Patient ID: [name], female   DOB: Jan 06, 1931, 81 y.o.   MRN: [id number]   [facility]   Chief Complaint  Patient presents with  . Acute Visit    ticks bites    HPI: Patient is a 81 y.o.  White female pt of Dr. Thomasene LotGreen's seen in the office today for an acute visit with multiple recent tick bites (4 sites).  The one on her left medial thigh remains itchy and irritated, raised, but nothing is seen remaining in her skin.  The others are healing on her abdomen, right leg. She's had no fever, arthralgias or rashes.  She says she normally does all she can to prevent these since the first one, but briefly stepped outside to help her husband in the yard unplanned when the last bite occurred.  Review of Systems:  Review of Systems  Constitutional: Negative for fever, chills and malaise/fatigue.  Musculoskeletal: Negative for myalgias and joint pain.  Skin: Positive for itching. Negative for rash.  Neurological: Negative for dizziness, weakness and headaches.    Past Medical History  Diagnosis Date  . Lumbago   . Personal history of fall   . Anxiety state, unspecified   . Unspecified constipation   . Open wound of toe(s), without mention of complication   . Allergic rhinitis due to pollen   . Unspecified tinnitus   . Insomnia, unspecified   . Unspecified hereditary and idiopathic peripheral neuropathy   . Insomnia, unspecified   . Diaphragmatic hernia without mention of obstruction or gangrene   . Sebaceous cyst   . Dizziness and giddiness   . Diffuse cystic mastopathy   . Osteoarthrosis, unspecified whether generalized or localized, unspecified site   . Unspecified hypothyroidism   . Other and unspecified hyperlipidemia   . Unspecified essential hypertension   . Sciatica   . Macular degeneration (senile) of retina, unspecified   . Senile osteoporosis   . Calculus of kidney   . Cataract     Past Surgical History    Procedure Laterality Date  . Cholecystectomy  07/25/2007  . Tonsillectomy and adenoidectomy Bilateral 1943    Allergies  Allergen Reactions  . Ambien [Zolpidem Tartrate]     hallucination  . Bactrim [Sulfamethoxazole-Trimethoprim]   . Morphine And Related Nausea And Vomiting  . Other     z pack    Medications: Patient's Medications  New Prescriptions   No medications on file  Previous Medications   ATORVASTATIN (LIPITOR) 20 MG TABLET    TAKE 1 TABLET BY MOUTH EVERY DAY   BEVACIZUMAB (AVASTIN) 100 MG/4ML SOLN    Inject monthly in right eye every 6 weeks.   CHLORPHENIRAMINE-HYDROCODONE (TUSSIONEX PENNKINETIC ER) 10-8 MG/5ML LQCR    Take 5 mLs by mouth every 6 (six) hours as needed (cough). Take one tsp every 12 hours as needed for cough   DOXAZOSIN (CARDURA) 4 MG TABLET    TAKE 1 TABLET BY MOUTH DAILY   INDAPAMIDE (LOZOL) 1.25 MG TABLET    TAKE 1 TABLET BY MOUTH DAILY   LEVOTHYROXINE (SYNTHROID, LEVOTHROID) 75 MCG TABLET    TAKE 1 TABLET BY MOUTH EVERY DAY FOR THYROID SUPPLEMENT   LORATADINE (CLARITIN) 10 MG TABLET    Take 10 mg by mouth daily. Take one tablet once a day as needed for allergies   METOPROLOL SUCCINATE (TOPROL-XL) 50 MG 24 HR TABLET    Take one tablet by mouth once daily with meal to control  blood pressure   OMEPRAZOLE (PRILOSEC) 20 MG CAPSULE    TAKE 1 TABLET BY MOUTH DAILY FOR REFLUX   POLYETHYLENE GLYCOL (MIRALAX / GLYCOLAX) PACKET    Take 17 g by mouth daily. Mix 17 grams in a 8 oz glass for constipation needed   RANIBIZUMAB (LUCENTIS) 0.3 MG/0.05ML SOLN    Inject into left eye every 5 weeks.   TEMAZEPAM (RESTORIL) 30 MG CAPSULE    TAKE ONE CAPSULE BY MOUTH AT BEDTIME AS NEEDED FOR SLEEP  Modified Medications   No medications on file  Discontinued Medications   ASPIRIN 81 MG TABLET    Take 81 mg by mouth. Take one tablet 3 times a week.    Physical Exam: Filed Vitals:   01/19/15 1159  BP: 120/76  Pulse: 71  Temp: 98.1 F (36.7 C)  TempSrc: Oral  Resp:  20  Height:  (1.6 m)  Weight: 130 lb 3.2 oz (59.058 kg)  SpO2: 97%   Physical Exam  Constitutional: She is oriented to person, place, and time. She appears well-developed and well-nourished. No distress.  Cardiovascular: Normal rate, regular rhythm and normal heart sounds.   Pulmonary/Chest: Effort normal and breath sounds normal.  Neurological: She is alert and oriented to person, place, and time.  Skin:  Erythematous raised area on left medial thigh, but no parts of tick visible; also has two places on right leg and one on her abdomen that are healing; no rashes seen    Labs reviewed: Basic Metabolic Panel:  Recent Labs  16/10/96 1018 10/24/14 0948  NA 136 138  K 4.0 4.1  CL 92* 94*  CO2 29 29  GLUCOSE 96 86  BUN 14 12  CREATININE 0.85 0.74  CALCIUM 9.7 9.6  TSH 2.860 3.930   Liver Function Tests:  Recent Labs  04/28/14 1018 10/24/14 0948  AST 19 15  ALT 11 13  ALKPHOS 100 89  BILITOT 0.6 0.4  PROT 6.1 5.5*   No results for input(s): LIPASE, AMYLASE in the last 8760 hours. No results for input(s): AMMONIA in the last 8760 hours. CBC: No results for input(s): WBC, NEUTROABS, HGB, HCT, MCV, PLT in the last 8760 hours. Lipid Panel:  Recent Labs  04/28/14 1018 10/24/14 0948  CHOL 135 127  HDL 51 46  LDLCALC 66 60  TRIG 90 105  CHOLHDL 2.6 2.8   Lab Results  Component Value Date   HGBA1C  07/23/2007    5.7 (NOTE)   The ADA recommends the following therapeutic goals for glycemic   control related to Hgb A1C measurement:   Goal of Therapy:   < 7.0% Hgb A1C   Action Suggested:  > 8.0% Hgb A1C   Ref:  Diabetes Care, 22, Suppl. 1, 1999     Assessment/Plan 1. Tick bites -multiple within a short period of time including one that was on her for a prolonged time and looked like a deer tick - doxycycline (VIBRA-TABS) 100 MG tablet; Take 1 tablet (100 mg total) by mouth 2 (two) times daily.  Dispense: 20 tablet; Refill: 0 -monitor for target  rash -encourage fluids -cover up when outside and use a bug spray with deet to prevent bites   Labs/tests ordered: no new Next appt:  Keep regular visit with Dr. Chilton Si; prn  Sicily Zaragoza L. Sricharan Lacomb, D.O. Geriatrics [facility] [address] [place], [place] [address] Cell Phone (Mon-Fri 8am-5pm):  [phone] On Call:  [phone] & follow prompts after 5pm &  weekends Office Phone:  [phone] Office Fax:  [phone]

## 2015-01-20 ENCOUNTER — Ambulatory Visit: Payer: Medicare Other | Admitting: Nurse Practitioner

## 2015-01-22 DIAGNOSIS — H3532 Exudative age-related macular degeneration: Secondary | ICD-10-CM | POA: Diagnosis not present

## 2015-01-23 ENCOUNTER — Other Ambulatory Visit: Payer: Self-pay | Admitting: Internal Medicine

## 2015-01-26 DIAGNOSIS — H3532 Exudative age-related macular degeneration: Secondary | ICD-10-CM | POA: Diagnosis not present

## 2015-02-05 ENCOUNTER — Other Ambulatory Visit: Payer: Self-pay

## 2015-02-05 ENCOUNTER — Other Ambulatory Visit: Payer: Self-pay | Admitting: Internal Medicine

## 2015-02-05 MED ORDER — TEMAZEPAM 30 MG PO CAPS: 30.0000 mg | ORAL_CAPSULE | Freq: Every evening | ORAL | Status: DC | PRN

## 2015-02-18 ENCOUNTER — Encounter: Payer: Self-pay | Admitting: Internal Medicine

## 2015-02-18 ENCOUNTER — Ambulatory Visit (INDEPENDENT_AMBULATORY_CARE_PROVIDER_SITE_OTHER): Payer: Medicare Other | Admitting: Internal Medicine

## 2015-02-18 VITALS — BP 148/80 | HR 65 | Temp 97.4°F | Resp 18 | Ht 63.0 in | Wt 128.0 lb

## 2015-02-18 DIAGNOSIS — I10 Essential (primary) hypertension: Secondary | ICD-10-CM

## 2015-02-18 DIAGNOSIS — R002 Palpitations: Secondary | ICD-10-CM

## 2015-02-18 DIAGNOSIS — K219 Gastro-esophageal reflux disease without esophagitis: Secondary | ICD-10-CM

## 2015-02-18 DIAGNOSIS — K449 Diaphragmatic hernia without obstruction or gangrene: Secondary | ICD-10-CM | POA: Diagnosis not present

## 2015-02-18 MED ORDER — SERTRALINE HCL 50 MG PO TABS: ORAL_TABLET | ORAL | Status: DC

## 2015-02-18 NOTE — Progress Notes (Signed)
Patient ID: [name], female   DOB: 1931-01-01, 81 y.o.   MRN: [id number]    Facility  [facility]    Place of Service:   OFFICE    Allergies  Allergen Reactions  . Ambien [Zolpidem Tartrate]     hallucination  . Bactrim [Sulfamethoxazole-Trimethoprim]   . Morphine And Related Nausea And Vomiting  . Other     z pack     Chief Complaint  Patient presents with  . Acute Visit    hiatal hernia issues x 2 weeks    HPI:  Has a fluttering in the upper abd. Used Toprol to get it under control. Started after eating. Also has problems with gassy feelings. Burping more. Has acid reflux. Less tolerant to tomatoes and other acid foods.Not eating as much. Eating crackers and more water intake. Steak associated with symptoms. Mylanta helps. Prilosec used daily. Has had cholecystectomy. Some things help like drinking hot water.  Long history of a spastic gut. Used to use Donnatal. Normal colonoscopy about 12 years ago by Dr. Juanda Chance.  Some dysphagia with food hanging in the lower chest.   Symptoms are worse if she gets nervous. She is worried about her daughter who has fibromyalgia. Feeling nervous inside. Sleep disturbed. Has wakefulness at night. She is sleeping propped up. Daughter and grandson are on Zoloft.  Medications: Patient's Medications  New Prescriptions   No medications on file  Previous Medications   ATORVASTATIN (LIPITOR) 20 MG TABLET    TAKE 1 TABLET BY MOUTH EVERY DAY   BEVACIZUMAB (AVASTIN) 100 MG/4ML SOLN    Inject monthly in right eye every 6 weeks.   CHLORPHENIRAMINE-HYDROCODONE (TUSSIONEX PENNKINETIC ER) 10-8 MG/5ML LQCR    Take 5 mLs by mouth every 6 (six) hours as needed (cough). Take one tsp every 12 hours as needed for cough   DOXAZOSIN (CARDURA) 4 MG TABLET    TAKE 1 TABLET BY MOUTH DAILY   DOXYCYCLINE (VIBRA-TABS) 100 MG TABLET    TAKE 1 TABLET (100 MG TOTAL) BY MOUTH 2 (TWO) TIMES DAILY.   INDAPAMIDE (LOZOL) 1.25 MG TABLET    TAKE 1 TABLET BY MOUTH DAILY   LEVOTHYROXINE (SYNTHROID, LEVOTHROID) 75 MCG TABLET    TAKE 1 TABLET BY MOUTH EVERY DAY FOR THYROID SUPPLEMENT   LORATADINE (CLARITIN) 10 MG TABLET    Take 10 mg by mouth daily. Take one tablet once a day as needed for allergies   METOPROLOL SUCCINATE (TOPROL-XL) 50 MG 24 HR TABLET    Take one tablet by mouth once daily with meal to control blood pressure   OMEPRAZOLE (PRILOSEC) 20 MG CAPSULE    TAKE ONE CAPSULE BY MOUTH EVERY DAY FOR REFLUX   POLYETHYLENE GLYCOL (MIRALAX / GLYCOLAX) PACKET    Take 17 g by mouth daily. Mix 17 grams in a 8 oz glass for constipation needed   RANIBIZUMAB (LUCENTIS) 0.3 MG/0.05ML SOLN    Inject into left eye every 5 weeks.   TEMAZEPAM (RESTORIL) 30 MG CAPSULE    Take 1 capsule (30 mg total) by mouth at bedtime as needed. for sleep  Modified Medications   No medications on file  Discontinued Medications   DOXYCYCLINE (VIBRA-TABS) 100 MG TABLET    Take 1 tablet (100 mg total) by mouth 2 (two) times daily.     Review of Systems  Constitutional: Negative.   Eyes: Positive for visual disturbance.       Macular degeneration.  Respiratory: Negative for cough, chest tightness, shortness of breath and  wheezing.        History of asthma.  Cardiovascular: Positive for palpitations. Negative for chest pain and leg swelling.  Gastrointestinal: Positive for constipation.       History of hemorrhoids.  Endocrine:       History hypothyroidism  Genitourinary: Negative for urgency, frequency, hematuria and flank pain.       History kidney stones at age 81.  Musculoskeletal: Negative for myalgias and back pain.  Skin: Negative.   Allergic/Immunologic: Negative.   Neurological: Negative.   Hematological: Negative.   Psychiatric/Behavioral: Negative.     Filed Vitals:   02/18/15 1447  BP: 148/80  Pulse: 65  Temp: 97.4 F (36.3 C)  TempSrc: Oral  Resp: 18  Height: 5\' 3"  (1.6 m)  Weight: 128 lb (58.06 kg)  SpO2: 95%   Body mass index is 22.68  kg/(m^2).  Physical Exam  Constitutional: She is oriented to person, place, and time. She appears well-nourished. No distress.  HENT:  Head: Normocephalic and atraumatic.  Right Ear: External ear normal.  Left Ear: External ear normal.  Nose: Nose normal.  Mouth/Throat: Oropharynx is clear and moist.  Partial hearing loss in both ears.  Eyes: Conjunctivae and EOM are normal. Pupils are equal, round, and reactive to light.  Corrective lenses.  Cardiovascular: Normal rate, regular rhythm, normal heart sounds and intact distal pulses.  Exam reveals no gallop and no friction rub.   No murmur heard. Pulmonary/Chest: No respiratory distress. She has no wheezes. She has no rales.  Abdominal: Bowel sounds are normal. She exhibits no distension and no mass. There is no tenderness.  Musculoskeletal: Normal range of motion. She exhibits no edema or tenderness.  Neurological: She is alert and oriented to person, place, and time. She has normal reflexes. No cranial nerve deficit. Coordination normal.  10/28/14 MMSE 29/30. Passed clock drawing.  Skin: No rash noted. No erythema. No pallor.  Multiple seborrheic keratoses.  Psychiatric: Her behavior is normal. Judgment and thought content normal.  Anxious and possibly depressed.     Labs reviewed: No visits with results within 3 Month(s) from this visit. Latest known visit with results is:  Appointment on 10/24/2014  Component Date Value Ref Range Status  . Cholesterol, Total 10/24/2014 127  100 - 199 mg/dL Final  . Triglycerides 10/24/2014 105  0 - 149 mg/dL Final  . HDL 74/25/956303/06/2015 46  >39 mg/dL Final   Comment: According to ATP-III Guidelines, HDL-C >59 mg/dL is considered a negative risk factor for CHD.   Marland Kitchen. VLDL Cholesterol Cal 10/24/2014 21  5 - 40 mg/dL Final  . LDL Calculated 10/24/2014 60  0 - 99 mg/dL Final  . Chol/HDL Ratio 10/24/2014 2.8  0.0 - 4.4 ratio units Final   Comment:                                   T. Chol/HDL Ratio                                              Men  Women                               1/2 Avg.Risk  3.4    3.3  Avg.Risk  5.0    4.4                                2X Avg.Risk  9.6    7.1                                3X Avg.Risk 23.4   11.0   . Glucose 10/24/2014 86  65 - 99 mg/dL Final  . BUN 16/05/9603 12  8 - 27 mg/dL Final  . Creatinine, Ser 10/24/2014 0.74  0.57 - 1.00 mg/dL Final  . GFR calc non Af Amer 10/24/2014 76  >59 mL/min/1.73 Final  . GFR calc Af Amer 10/24/2014 88  >59 mL/min/1.73 Final  . BUN/Creatinine Ratio 10/24/2014 16  11 - 26 Final  . Sodium 10/24/2014 138  134 - 144 mmol/L Final  . Potassium 10/24/2014 4.1  3.5 - 5.2 mmol/L Final  . Chloride 10/24/2014 94* 97 - 108 mmol/L Final  . CO2 10/24/2014 29  18 - 29 mmol/L Final  . Calcium 10/24/2014 9.6  8.7 - 10.3 mg/dL Final  . Total Protein 10/24/2014 5.5* 6.0 - 8.5 g/dL Final  . Albumin 54/04/8118 3.6  3.5 - 4.7 g/dL Final  . Globulin, Total 10/24/2014 1.9  1.5 - 4.5 g/dL Final  . Albumin/Globulin Ratio 10/24/2014 1.9  1.1 - 2.5 Final  . Bilirubin Total 10/24/2014 0.4  0.0 - 1.2 mg/dL Final  . Alkaline Phosphatase 10/24/2014 89  39 - 117 IU/L Final  . AST 10/24/2014 15  0 - 40 IU/L Final  . ALT 10/24/2014 13  0 - 32 IU/L Final  . TSH 10/24/2014 3.930  0.450 - 4.500 uIU/mL Final     Assessment/Plan  1. Diaphragmatic hernia without obstruction and without gangrene Possibly some spasticity with fluttering sensations  2. Gastroesophageal reflux disease without esophagitis Continue Prilosec  3. Essential hypertension Controlled. Mild elevation in SBP. Recheck next visit in Sept.  4. Palpitations Related to anxiety and possible depression. - sertraline (ZOLOFT) 50 MG tablet; One daily to help calm nerves.  Dispense: 30 tablet; Refill: 5

## 2015-02-23 ENCOUNTER — Telehealth: Payer: Self-pay

## 2015-02-23 ENCOUNTER — Other Ambulatory Visit: Payer: Self-pay | Admitting: Internal Medicine

## 2015-02-23 DIAGNOSIS — R109 Unspecified abdominal pain: Secondary | ICD-10-CM | POA: Insufficient documentation

## 2015-02-23 NOTE — Telephone Encounter (Signed)
Patient would like to have a referral to Dr. Juanda ChanceBrodie for her gassy feeling, acid reflux. Using the medication Dr. Chilton SiGreen gave her, not getting any better. Will forward note to Dr. Chilton SiGreen so he can put in an order. Patient request that if Dr. Juanda ChanceBrodie can't see her soon, any one would be fine.

## 2015-02-24 ENCOUNTER — Telehealth: Payer: Self-pay | Admitting: Internal Medicine

## 2015-02-24 NOTE — Telephone Encounter (Signed)
Pt states she has been having bad stomach pain and not able to eat. States her stomach feels "nervous" inside. Pt scheduled to see Lori Hvozdovic, PA-C Tomorrow at 2:15pm. Pt aware of appt.

## 2015-02-25 ENCOUNTER — Ambulatory Visit (INDEPENDENT_AMBULATORY_CARE_PROVIDER_SITE_OTHER): Payer: Medicare Other | Admitting: Physician Assistant

## 2015-02-25 ENCOUNTER — Ambulatory Visit: Payer: Medicare Other | Admitting: Internal Medicine

## 2015-02-25 ENCOUNTER — Encounter: Payer: Self-pay | Admitting: Physician Assistant

## 2015-02-25 VITALS — BP 106/60 | HR 64 | Ht 62.25 in | Wt 127.5 lb

## 2015-02-25 DIAGNOSIS — K589 Irritable bowel syndrome without diarrhea: Secondary | ICD-10-CM | POA: Diagnosis not present

## 2015-02-25 DIAGNOSIS — R1013 Epigastric pain: Secondary | ICD-10-CM

## 2015-02-25 DIAGNOSIS — K219 Gastro-esophageal reflux disease without esophagitis: Secondary | ICD-10-CM

## 2015-02-25 MED ORDER — NA SULFATE-K SULFATE-MG SULF 17.5-3.13-1.6 GM/177ML PO SOLN: ORAL | Status: DC

## 2015-02-25 MED ORDER — METRONIDAZOLE 250 MG PO TABS: 250.0000 mg | ORAL_TABLET | Freq: Three times a day (TID) | ORAL | Status: DC

## 2015-02-25 MED ORDER — PANTOPRAZOLE SODIUM 40 MG PO TBEC
DELAYED_RELEASE_TABLET | ORAL | Status: DC
Start: 2015-02-25 — End: 2015-03-23

## 2015-02-25 NOTE — Patient Instructions (Signed)
Stop taking Omeprazole  We have sent the following medications to your pharmacy for you to pick up at your convenience:  Pantoprazole 40mg  1 tablet 30 minutes prior to breakfast.  Flagyl 250mg  1 tablets three times a day for 7 days.  You have been scheduled for an endoscopy. Please follow written instructions given to you at your visit today. If you use inhalers (even only as needed), please bring them with you on the day of your procedure.  Food Choices for Gastroesophageal Reflux Disease When you have gastroesophageal reflux disease (GERD), the foods you eat and your eating habits are very important. Choosing the right foods can help ease the discomfort of GERD. WHAT GENERAL GUIDELINES DO I NEED TO FOLLOW?  Choose fruits, vegetables, whole grains, low-fat dairy products, and low-fat meat, fish, and poultry.  Limit fats such as oils, salad dressings, butter, nuts, and avocado.  Keep a food diary to identify foods that cause symptoms.  Avoid foods that cause reflux. These may be different for different people.  Eat frequent small meals instead of three large meals each day.  Eat your meals slowly, in a relaxed setting.  Limit fried foods.  Cook foods using methods other than frying.  Avoid drinking alcohol.  Avoid drinking large amounts of liquids with your meals.  Avoid bending over or lying down until 2-3 hours after eating. WHAT FOODS ARE NOT RECOMMENDED? The following are some foods and drinks that may worsen your symptoms: Vegetables Tomatoes. Tomato juice. Tomato and spaghetti sauce. Chili peppers. Onion and garlic. Horseradish. Fruits Oranges, grapefruit, and lemon (fruit and juice). Meats High-fat meats, fish, and poultry. This includes hot dogs, ribs, ham, sausage, salami, and bacon. Dairy Whole milk and chocolate milk. Sour cream. Cream. Butter. Ice cream. Cream cheese.  Beverages Coffee and tea, with or without caffeine. Carbonated beverages or energy  drinks. Condiments Hot sauce. Barbecue sauce.  Sweets/Desserts Chocolate and cocoa. Donuts. Peppermint and spearmint. Fats and Oils High-fat foods, including [place] fries and potato chips. Other Vinegar. Strong spices, such as black pepper, white pepper, red pepper, cayenne, curry powder, cloves, ginger, and chili powder. The items listed above may not be a complete list of foods and beverages to avoid. Contact your dietitian for more information. Document Released: 08/01/2005 Document Revised: 08/06/2013 Document Reviewed: 06/05/2013 [facility] Patient Information 2015 [place], [place]. This information is not intended to replace advice given to you by your health care provider. Make sure you discuss any questions you have with your health care provider.

## 2015-02-25 NOTE — Progress Notes (Signed)
Patient ID: [name], female   DOB: 1930-09-04, 81 y.o.   MRN: [id number]    HPI:  [name] is a 81 y.o.   female  referred by Kimber Relic, MD for evaluation of epigastric pain. She is known to Dr. Dickie La from previous colonoscopy in October 2003 which was a normal examination with no polyps. Small hemorrhoids were noted. At that time she was advised to have surveillance in 10 years, but in 2013 was advised by her PCP that due to her age no further colonoscopies were warranted.  [name] states she has had "nervous gut" since age 64,. She states that she was on Donnatal and Mylanta for years. Several years ago she was found to have a hiatal hernia. [name] had a cholecystectomy in 2008 and states since that surgery she has to be very careful as to what she eats. Over the past 1-2 months she feels that her abdomen and esophagus go into spasm. She states in 2013 while eating she developed chest pain and was seen at [facility] in [place]. She states she ruled out for an MI but was told she had a hiatal hernia and it was at that time she was advised to use Donnatal and Mylanta. More recently she has been belching a lot and wakes up every morning burping she feels full sooner than normal. [name] has been having frequent acid reflux. Her symptoms are worse if she is nervous. Her sleep has been significantly interrupted due to these symptoms. She denies dysphagia or odynophagia. [name] has been on omeprazole for a period of time but feels it has lost its effect. Recently when she feels she is having esophageal spasm she takes half a Toprol and feels it helps. She was started on Zoloft last week because she feels very anxious and has not noticed a difference as of yet. She denies globus sensation. She denies nocturnal regurgitation. She tends to be constipated and has been using activity and Mira lax.   Past Medical History  Diagnosis Date  . Lumbago   . Personal history of fall   .  Anxiety state, unspecified   . Unspecified constipation   . Open wound of toe(s), without mention of complication   . Allergic rhinitis due to pollen   . Unspecified tinnitus   . Insomnia, unspecified   . Unspecified hereditary and idiopathic peripheral neuropathy   . Insomnia, unspecified   . Diaphragmatic hernia without mention of obstruction or gangrene   . Sebaceous cyst   . Dizziness and giddiness   . Diffuse cystic mastopathy   . Osteoarthrosis, unspecified whether generalized or localized, unspecified site   . Unspecified hypothyroidism   . Other and unspecified hyperlipidemia   . Unspecified essential hypertension   . Sciatica   . Macular degeneration (senile) of retina, unspecified   . Senile osteoporosis   . Calculus of kidney   . Cataract   . GERD (gastroesophageal reflux disease)   . Hiatal hernia     Past Surgical History  Procedure Laterality Date  . Cholecystectomy  07/25/2007  . Tonsillectomy and adenoidectomy Bilateral 1943  . Cataract extraction, bilateral     Family History  Problem Relation Age of Onset  . Hypertension Brother   . Fibromyalgia Daughter   . Hypertension Son   . Hypertension Brother   . Hyperlipidemia Brother    History  Substance Use Topics  . Smoking status: Never Smoker   . Smokeless tobacco: Not on  file  . Alcohol Use: No   Current Outpatient Prescriptions  Medication Sig Dispense Refill  . atorvastatin (LIPITOR) 20 MG tablet TAKE 1 TABLET BY MOUTH EVERY DAY 90 tablet 0  . Bevacizumab (AVASTIN) 100 MG/4ML SOLN Inject monthly in right eye every 6 weeks.    . chlorpheniramine-HYDROcodone (TUSSIONEX PENNKINETIC ER) 10-8 MG/5ML LQCR Take 5 mLs by mouth every 6 (six) hours as needed (cough). Take one tsp every 12 hours as needed for cough 140 mL 0  . doxazosin (CARDURA) 4 MG tablet TAKE 1 TABLET BY MOUTH DAILY 90 tablet 3  . indapamide (LOZOL) 1.25 MG tablet TAKE 1 TABLET BY MOUTH DAILY 90 tablet 1  . levothyroxine (SYNTHROID,  LEVOTHROID) 75 MCG tablet TAKE 1 TABLET BY MOUTH EVERY DAY FOR THYROID SUPPLEMENT 90 tablet 1  . loratadine (CLARITIN) 10 MG tablet Take 10 mg by mouth daily. Take one tablet once a day as needed for allergies    . metoprolol succinate (TOPROL-XL) 50 MG 24 hr tablet Take one tablet by mouth once daily with meal to control blood pressure 90 tablet 2  . metroNIDAZOLE (FLAGYL) 250 MG tablet Take 1 tablet (250 mg total) by mouth 3 (three) times daily. 21 tablet 0  . pantoprazole (PROTONIX) 40 MG tablet Take in the am 30 minutes before breakfast 30 tablet 6  . polyethylene glycol (MIRALAX / GLYCOLAX) packet Take 17 g by mouth daily. Mix 17 grams in a 8 oz glass for constipation needed    . Ranibizumab (LUCENTIS) 0.3 MG/0.05ML SOLN Inject into left eye every 5 weeks.    . sertraline (ZOLOFT) 50 MG tablet One daily to help calm nerves. 30 tablet 5  . temazepam (RESTORIL) 30 MG capsule Take 1 capsule (30 mg total) by mouth at bedtime as needed. for sleep 30 capsule 0   No current facility-administered medications for this visit.   Allergies  Allergen Reactions  . Ambien [Zolpidem Tartrate]     hallucination  . Bactrim [Sulfamethoxazole-Trimethoprim]   . Morphine And Related Nausea And Vomiting  . Other     z pack      Review of Systems: Gen: Denies any fever, chills, sweats, anorexia, fatigue, weakness, malaise, weight loss CV: Denies chest pain, angina, palpitations, syncope, orthopnea, PND, peripheral edema, and claudication. Resp: Denies dyspnea at rest, dyspnea with exercise, cough, sputum, wheezing, coughing up blood, and pleurisy. GI: Denies vomiting blood, jaundice, and fecal incontinence.   Denies dysphagia or odynophagia. GU : Denies urinary burning, blood in urine, urinary frequency, urinary hesitancy, nocturnal urination, and urinary incontinence. MS: Denies joint pain, limitation of movement, and swelling, stiffness, low back pain, extremity pain. Denies muscle weakness, cramps,  atrophy.  Derm: Denies rash, itching, dry skin, hives, moles, warts, or unhealing ulcers.  Psych: Denies  memory loss, suicidal ideation, hallucinations, paranoia, and confusion. Heme: Denies bruising, bleeding, and enlarged lymph nodes. Neuro:  Denies any headaches, dizziness, paresthesias. Endo:  Denies any problems with DM,  adrenal function     LAB RESULTS: Comprehensive metabolic panel 10/24/2014 total bili 0.4, alkaline phosphatase 89, AST 15, ALT 13.  Prior Endoscopies:   Colonoscopy 05/20/2002 normal exam. No polyps. Small hemorrhoids.  Physical Exam: BP 106/60 mmHg  Pulse 64  Ht 5' 2.25" (1.581 m)  Wt 127 lb 8 oz (57.834 kg)  BMI 23.14 kg/m2 Constitutional: Pleasant,well-developed female in no acute distress. HEENT: Normocephalic and atraumatic. Conjunctivae are normal. No scleral icterus. Neck supple. No thyromegaly Cardiovascular: Normal rate, regular rhythm.  Pulmonary/chest: Effort normal  and breath sounds normal. No wheezing, rales or rhonchi. Abdominal: Soft, nondistended, nontender. Bowel sounds active throughout. There are no masses palpable. No hepatomegaly. Extremities: no edema Lymphadenopathy: No cervical adenopathy noted. Neurological: Alert and oriented to person place and time. Skin: Skin is warm and dry. No rashes noted. Psychiatric: Normal mood and affect. Behavior is normal.  ASSESSMENT AND PLAN: 81 year old female with a known history of GERD presenting with 1-2 months of dyspepsia, epigastric discomfort, amongst a history of IBS. An antireflux regimen has been reviewed. She will DC omeprazole. She will be given a trial of pantoprazole 40 mg 1 by mouth every morning 30 minutes prior to breakfast. We will also give her a trial of Flagyl 250 mg 1 by mouth 3 times a day for 7 days for possible bacterial overgrowth adding to her dyspeptic symptoms. She will be scheduled for an EGD to evaluate for esophagitis, gastritis, ulcer, etc.The risks, benefits, and  alternatives to endoscopy with possible biopsy and possible dilation were discussed with the patient and they consent to proceed. Further recommendations will be made pending the findings of the above. The procedure will be scheduled with Dr. Juanda Chance.    Brittnei Jagiello, Moise Boring 02/25/2015, 4:10 PM  CC: Kimber Relic, MD

## 2015-02-25 NOTE — Progress Notes (Signed)
Reviewed and agree.

## 2015-03-02 NOTE — Telephone Encounter (Signed)
Saw Hvozdovic, PA for Dr. Juanda ChanceBrodie 02/25/15

## 2015-03-03 DIAGNOSIS — H3532 Exudative age-related macular degeneration: Secondary | ICD-10-CM | POA: Diagnosis not present

## 2015-03-05 ENCOUNTER — Other Ambulatory Visit: Payer: Self-pay | Admitting: Internal Medicine

## 2015-03-05 ENCOUNTER — Other Ambulatory Visit: Payer: Self-pay | Admitting: Nurse Practitioner

## 2015-03-09 ENCOUNTER — Telehealth: Payer: Self-pay | Admitting: Internal Medicine

## 2015-03-09 DIAGNOSIS — K219 Gastro-esophageal reflux disease without esophagitis: Secondary | ICD-10-CM

## 2015-03-09 DIAGNOSIS — R1110 Vomiting, unspecified: Secondary | ICD-10-CM

## 2015-03-09 DIAGNOSIS — IMO0001 Reserved for inherently not codable concepts without codable children: Secondary | ICD-10-CM

## 2015-03-09 NOTE — Telephone Encounter (Signed)
Dr. Gessner please review 

## 2015-03-09 NOTE — Telephone Encounter (Signed)
OK for me to assume care. I have reviewed records and suggest:  1) Cancel EGD 2) schedule ba swallow w/ tablet re: reflux and regurgitation 3) will call w/ results

## 2015-03-10 ENCOUNTER — Ambulatory Visit (INDEPENDENT_AMBULATORY_CARE_PROVIDER_SITE_OTHER): Payer: Medicare Other | Admitting: Internal Medicine

## 2015-03-10 ENCOUNTER — Encounter: Payer: Self-pay | Admitting: Internal Medicine

## 2015-03-10 VITALS — BP 168/72 | HR 68 | Temp 97.8°F | Resp 18 | Ht 63.0 in | Wt 126.6 lb

## 2015-03-10 DIAGNOSIS — I10 Essential (primary) hypertension: Secondary | ICD-10-CM | POA: Diagnosis not present

## 2015-03-10 DIAGNOSIS — R109 Unspecified abdominal pain: Secondary | ICD-10-CM

## 2015-03-10 DIAGNOSIS — F411 Generalized anxiety disorder: Secondary | ICD-10-CM | POA: Diagnosis not present

## 2015-03-10 DIAGNOSIS — R002 Palpitations: Secondary | ICD-10-CM

## 2015-03-10 DIAGNOSIS — K2100 Gastro-esophageal reflux disease with esophagitis, without bleeding: Secondary | ICD-10-CM

## 2015-03-10 DIAGNOSIS — G4700 Insomnia, unspecified: Secondary | ICD-10-CM

## 2015-03-10 DIAGNOSIS — E039 Hypothyroidism, unspecified: Secondary | ICD-10-CM

## 2015-03-10 MED ORDER — ALPRAZOLAM 1 MG PO TABS: ORAL_TABLET | ORAL | Status: DC

## 2015-03-10 MED ORDER — SERTRALINE HCL 50 MG PO TABS: ORAL_TABLET | ORAL | Status: DC

## 2015-03-10 MED ORDER — METOPROLOL SUCCINATE ER 50 MG PO TB24: ORAL_TABLET | ORAL | Status: DC

## 2015-03-10 NOTE — Telephone Encounter (Signed)
Patient is already scheduled for an EGD with Dr. Adela Lank in September. I was not aware of that. Do you still want patient cancelled for EGD?

## 2015-03-10 NOTE — Patient Instructions (Signed)
Note changes in your medications. Call Dr. Marvell Fuller office to determine if you are to have EGD or Barium swallow.

## 2015-03-10 NOTE — Telephone Encounter (Signed)
Left a message for patient to return my call. 

## 2015-03-10 NOTE — Progress Notes (Signed)
Patient ID: [name], female   DOB: [birth year], 81 y.o.   MRN: [id number]    Facility  [facility]    Place of Service:   OFFICE    Allergies  Allergen Reactions  . Ambien [Zolpidem Tartrate]     hallucination  . Bactrim [Sulfamethoxazole-Trimethoprim]   . Morphine And Related Nausea And Vomiting  . Other     z pack     Chief Complaint  Patient presents with  . Medical Management of Chronic Issues    trouble sleeping, fell over the weekend, toes numb on rt foot    HPI:  Patient is not doing well. She feels completely out of sorts. She's had trouble sleeping. She feels off balance. There've been palpitations and spasms in the esophagus:.  She has had constipation and then diarrhea.  There have been palpitations consisting of rapid and irregular beats or chest.  On 02/25/2015 omeprazole was changed to pantoprazole because of her GI symptoms. Flagyl was started. She had a scheduled EGD which was then switched to a barium swallow with a tablet per orders of gastroenterologist. She has a known hiatal hernia, GERD, and anxiety.  Systolic blood pressure is high today but she thinks this is related to her anxiety and nervousness.  Medications: Patient's Medications  New Prescriptions   No medications on file  Previous Medications   ATORVASTATIN (LIPITOR) 20 MG TABLET    TAKE 1 TABLET BY MOUTH EVERY DAY   BEVACIZUMAB (AVASTIN) 100 MG/4ML SOLN    Inject monthly in right eye every 6 weeks.   CHLORPHENIRAMINE-HYDROCODONE (TUSSIONEX PENNKINETIC ER) 10-8 MG/5ML LQCR    Take 5 mLs by mouth every 6 (six) hours as needed (cough). Take one tsp every 12 hours as needed for cough   DOXAZOSIN (CARDURA) 4 MG TABLET    TAKE 1 TABLET BY MOUTH DAILY   INDAPAMIDE (LOZOL) 1.25 MG TABLET    TAKE 1 TABLET BY MOUTH DAILY   LEVOTHYROXINE (SYNTHROID, LEVOTHROID) 75 MCG TABLET    TAKE 1 TABLET BY MOUTH EVERY DAY FOR THYROID SUPPLEMENT   LORATADINE (CLARITIN) 10 MG TABLET    Take 10 mg by mouth daily. Take  one tablet once a day as needed for allergies   METOPROLOL SUCCINATE (TOPROL-XL) 50 MG 24 HR TABLET    Take one tablet by mouth once daily with meal to control blood pressure   METRONIDAZOLE (FLAGYL) 250 MG TABLET    Take 1 tablet (250 mg total) by mouth 3 (three) times daily.   OMEPRAZOLE (PRILOSEC) 20 MG CAPSULE    TAKE ONE CAPSULE BY MOUTH EVERY DAY FOR REFLUX   PANTOPRAZOLE (PROTONIX) 40 MG TABLET    Take in the am 30 minutes before breakfast   POLYETHYLENE GLYCOL (MIRALAX / GLYCOLAX) PACKET    Take 17 g by mouth daily. Mix 17 grams in a 8 oz glass for constipation needed   RANIBIZUMAB (LUCENTIS) 0.3 MG/0.05ML SOLN    Inject into left eye every 5 weeks.   SERTRALINE (ZOLOFT) 50 MG TABLET    One daily to help calm nerves.   TEMAZEPAM (RESTORIL) 30 MG CAPSULE    TAKE 1 CAPSULE AT BEDTIME AS NEEDED SLEEP  Modified Medications   No medications on file  Discontinued Medications   No medications on file     Review of Systems  Constitutional: Negative.   Eyes: Positive for visual disturbance.       Macular degeneration.  Respiratory: Negative for cough, chest tightness, shortness of breath and  wheezing.        History of asthma.  Cardiovascular: Positive for palpitations. Negative for chest pain and leg swelling.  Gastrointestinal: Positive for constipation.       History of hemorrhoids.  Endocrine:       History hypothyroidism  Genitourinary: Negative for urgency, frequency, hematuria and flank pain.       History kidney stones at age 75.  Musculoskeletal: Negative for myalgias and back pain.  Skin: Negative.   Allergic/Immunologic: Negative.   Neurological: Negative.   Hematological: Negative.   Psychiatric/Behavioral: Negative.     Filed Vitals:   03/10/15 1516  BP: 168/72  Pulse: 68  Temp: 97.8 F (36.6 C)  TempSrc: Oral  Resp: 18  Height:  (1.6 m)  Weight: 126 lb 9.6 oz (57.425 kg)  SpO2: 95%   Body mass index is 22.43 kg/(m^2).  Physical Exam    Constitutional: She is oriented to person, place, and time. She appears well-nourished. No distress.  HENT:  Head: Normocephalic and atraumatic.  Right Ear: External ear normal.  Left Ear: External ear normal.  Nose: Nose normal.  Mouth/Throat: Oropharynx is clear and moist.  Partial hearing loss in both ears.  Eyes: Conjunctivae and EOM are normal. Pupils are equal, round, and reactive to light.  Corrective lenses.  Cardiovascular: Normal rate, regular rhythm, normal heart sounds and intact distal pulses.  Exam reveals no gallop and no friction rub.   No murmur heard. Pulmonary/Chest: No respiratory distress. She has no wheezes. She has no rales.  Abdominal: Bowel sounds are normal. She exhibits no distension and no mass. There is no tenderness.  Musculoskeletal: Normal range of motion. She exhibits no edema or tenderness.  Neurological: She is alert and oriented to person, place, and time. She has normal reflexes. No cranial nerve deficit. Coordination normal.  10/28/14 MMSE 29/30. Passed clock drawing.  Skin: No rash noted. No erythema. No pallor.  Multiple seborrheic keratoses.  Psychiatric: Her behavior is normal. Judgment and thought content normal.  Anxious and possibly depressed.     Labs reviewed: No visits with results within 3 Month(s) from this visit. Latest known visit with results is:  Appointment on 10/24/2014  Component Date Value Ref Range Status  . Cholesterol, Total 10/24/2014 127  100 - 199 mg/dL Final  . Triglycerides 10/24/2014 105  0 - 149 mg/dL Final  . HDL 53/66/4403 46  >39 mg/dL Final   Comment: According to ATP-III Guidelines, HDL-C >59 mg/dL is considered a negative risk factor for CHD.   Marland Kitchen VLDL Cholesterol Cal 10/24/2014 21  5 - 40 mg/dL Final  . LDL Calculated 10/24/2014 60  0 - 99 mg/dL Final  . Chol/HDL Ratio 10/24/2014 2.8  0.0 - 4.4 ratio units Final   Comment:                                   T. Chol/HDL Ratio                                              Men  Women                               1/2 Avg.Risk  3.4    3.3  Avg.Risk  5.0    4.4                                2X Avg.Risk  9.6    7.1                                3X Avg.Risk 23.4   11.0   . Glucose 10/24/2014 86  65 - 99 mg/dL Final  . BUN 13/24/4010 12  8 - 27 mg/dL Final  . Creatinine, Ser 10/24/2014 0.74  0.57 - 1.00 mg/dL Final  . GFR calc non Af Amer 10/24/2014 76  >59 mL/min/1.73 Final  . GFR calc Af Amer 10/24/2014 88  >59 mL/min/1.73 Final  . BUN/Creatinine Ratio 10/24/2014 16  11 - 26 Final  . Sodium 10/24/2014 138  134 - 144 mmol/L Final  . Potassium 10/24/2014 4.1  3.5 - 5.2 mmol/L Final  . Chloride 10/24/2014 94* 97 - 108 mmol/L Final  . CO2 10/24/2014 29  18 - 29 mmol/L Final  . Calcium 10/24/2014 9.6  8.7 - 10.3 mg/dL Final  . Total Protein 10/24/2014 5.5* 6.0 - 8.5 g/dL Final  . Albumin 27/25/3664 3.6  3.5 - 4.7 g/dL Final  . Globulin, Total 10/24/2014 1.9  1.5 - 4.5 g/dL Final  . Albumin/Globulin Ratio 10/24/2014 1.9  1.1 - 2.5 Final  . Bilirubin Total 10/24/2014 0.4  0.0 - 1.2 mg/dL Final  . Alkaline Phosphatase 10/24/2014 89  39 - 117 IU/L Final  . AST 10/24/2014 15  0 - 40 IU/L Final  . ALT 10/24/2014 13  0 - 32 IU/L Final  . TSH 10/24/2014 3.930  0.450 - 4.500 uIU/mL Final     Assessment/Plan  1. Palpitations Possibly related to her rise in anxiety. - TSH - sertraline (ZOLOFT) 50 MG tablet; 2 each evening to help calm nerves.  Dispense: 60 tablet; Refill: 5  2. Abdominal distress Continue GI appointments - CMP  3. Gastroesophageal reflux disease with esophagitis Continue to follow advice of gastroenterologist - CMP - CBC with Differential  4. Essential hypertension - CMP - Increase  metoprolol succinate (TOPROL-XL) 50 MG 24 hr tablet; Take two tablets by mouth daily with a meal to control blood pressure  Dispense: 90 tablet; Refill: 2  5. Insomnia - ALPRAZolam (XANAX) 1 MG tablet; One up  to 3 times in 24 hours for anxiety or rest  Dispense: 50 tablet; Refill: 2  6. Anxiety state - ALPRAZolam (XANAX) 1 MG tablet; One up to 3 times in 24 hours for anxiety or rest  Dispense: 50 tablet; Refill: 2  7. Hypothyroidism, unspecified hypothyroidism type - TSH

## 2015-03-10 NOTE — Telephone Encounter (Signed)
After chart review I think that is a good first step and would cancel the EGD

## 2015-03-11 LAB — CBC WITH DIFFERENTIAL/PLATELET
Basophils Absolute: 0 10*3/uL (ref 0.0–0.2)
Basos: 0 %
EOS (ABSOLUTE): 0 10*3/uL (ref 0.0–0.4)
Eos: 0 %
Hematocrit: 37.4 % (ref 34.0–46.6)
Hemoglobin: 12.9 g/dL (ref 11.1–15.9)
Immature Grans (Abs): 0 10*3/uL (ref 0.0–0.1)
Immature Granulocytes: 0 %
Lymphocytes Absolute: 0.7 10*3/uL (ref 0.7–3.1)
Lymphs: 12 %
MCH: 28.2 pg (ref 26.6–33.0)
MCHC: 34.5 g/dL (ref 31.5–35.7)
MCV: 82 fL (ref 79–97)
Monocytes Absolute: 0.4 10*3/uL (ref 0.1–0.9)
Monocytes: 8 %
Neutrophils Absolute: 4.3 10*3/uL (ref 1.4–7.0)
Neutrophils: 80 %
Platelets: 158 10*3/uL (ref 150–379)
RBC: 4.58 x10E6/uL (ref 3.77–5.28)
RDW: 14 % (ref 12.3–15.4)
WBC: 5.4 10*3/uL (ref 3.4–10.8)

## 2015-03-11 LAB — COMPREHENSIVE METABOLIC PANEL
ALT: 28 IU/L (ref 0–32)
AST: 28 IU/L (ref 0–40)
Albumin/Globulin Ratio: 1.7 (ref 1.1–2.5)
Albumin: 4.1 g/dL (ref 3.5–4.7)
Alkaline Phosphatase: 78 IU/L (ref 39–117)
BUN/Creatinine Ratio: 18 (ref 11–26)
BUN: 11 mg/dL (ref 8–27)
Bilirubin Total: 0.3 mg/dL (ref 0.0–1.2)
CO2: 29 mmol/L (ref 18–29)
Calcium: 9.4 mg/dL (ref 8.7–10.3)
Chloride: 84 mmol/L — ABNORMAL LOW (ref 97–108)
Creatinine, Ser: 0.6 mg/dL (ref 0.57–1.00)
GFR calc Af Amer: 98 mL/min/{1.73_m2} (ref >59)
GFR calc non Af Amer: 85 mL/min/{1.73_m2} (ref >59)
Globulin, Total: 2.4 g/dL (ref 1.5–4.5)
Glucose: 130 mg/dL — ABNORMAL HIGH (ref 65–99)
Potassium: 3.2 mmol/L — ABNORMAL LOW (ref 3.5–5.2)
Sodium: 129 mmol/L — ABNORMAL LOW (ref 134–144)
Total Protein: 6.5 g/dL (ref 6.0–8.5)

## 2015-03-11 LAB — TSH: TSH: 3.84 u[IU]/mL (ref 0.450–4.500)

## 2015-03-11 NOTE — Telephone Encounter (Signed)
Informed patient of Dr. Marvell Fuller recommendations to cancel EGD and schedule patient for Barium swallow with tablet. Pt verbalized understanding and agrees with plan. Scheduled pt for 03/19/15 at 10:00am arriving at 9:45am at [facility] radiology department. Informed patient npo 3 hours prior to test. Pt verbalized understanding.

## 2015-03-18 ENCOUNTER — Encounter: Payer: Medicare Other | Admitting: Internal Medicine

## 2015-03-19 ENCOUNTER — Ambulatory Visit (HOSPITAL_COMMUNITY)
Admission: RE | Admit: 2015-03-19 | Discharge: 2015-03-19 | Disposition: A | Discharge status: Home / Self Care | Payer: Medicare Other | Source: Ambulatory Visit | Attending: Internal Medicine | Admitting: Internal Medicine

## 2015-03-19 DIAGNOSIS — R1110 Vomiting, unspecified: Secondary | ICD-10-CM

## 2015-03-19 DIAGNOSIS — R1310 Dysphagia, unspecified: Secondary | ICD-10-CM | POA: Insufficient documentation

## 2015-03-19 DIAGNOSIS — IMO0001 Reserved for inherently not codable concepts without codable children: Secondary | ICD-10-CM

## 2015-03-19 DIAGNOSIS — K219 Gastro-esophageal reflux disease without esophagitis: Secondary | ICD-10-CM | POA: Insufficient documentation

## 2015-03-19 NOTE — Progress Notes (Signed)
Quick Note:  She had one episode of silent aspiration - not sure if this is cause of her sxs Otherwise ok - NL Please get an update fromher on sxs of regurgitation, abd pain, gas and bloating after PPI and metronidazole Rxs  ______

## 2015-03-20 ENCOUNTER — Telehealth: Payer: Self-pay | Admitting: *Deleted

## 2015-03-20 MED ORDER — SERTRALINE HCL 50 MG PO TABS: 50.0000 mg | ORAL_TABLET | Freq: Every day | ORAL | Status: DC

## 2015-03-20 NOTE — Telephone Encounter (Signed)
Patient called stating she was in the office the other day and they did a barium swallow today and she only aspirated one time and everything is normal. At the appointment time you had changed the Zoloft to 2 at bedtime and Xanax at daytime. Patient cannot take two Zoloft so she went back to only one at a time and went back to taking the Xanax with the sleeping pill at night and sleeps fine. She couldn't balance herself with taking 2 Zoloft. She also stated that her BP has been better also. She stated that all her symptoms have subsided.

## 2015-03-23 ENCOUNTER — Other Ambulatory Visit: Payer: Self-pay | Admitting: *Deleted

## 2015-03-23 MED ORDER — PANTOPRAZOLE SODIUM 40 MG PO TBEC: DELAYED_RELEASE_TABLET | ORAL | Status: DC

## 2015-03-23 NOTE — Telephone Encounter (Signed)
[facility] 

## 2015-03-25 DIAGNOSIS — H3532 Exudative age-related macular degeneration: Secondary | ICD-10-CM | POA: Diagnosis not present

## 2015-03-25 DIAGNOSIS — H35051 Retinal neovascularization, unspecified, right eye: Secondary | ICD-10-CM | POA: Diagnosis not present

## 2015-04-06 ENCOUNTER — Other Ambulatory Visit: Payer: Self-pay | Admitting: Internal Medicine

## 2015-04-14 DIAGNOSIS — H3532 Exudative age-related macular degeneration: Secondary | ICD-10-CM | POA: Diagnosis not present

## 2015-04-20 ENCOUNTER — Other Ambulatory Visit: Payer: Self-pay | Admitting: Internal Medicine

## 2015-04-21 ENCOUNTER — Encounter: Payer: Self-pay | Admitting: Nurse Practitioner

## 2015-04-21 ENCOUNTER — Ambulatory Visit (INDEPENDENT_AMBULATORY_CARE_PROVIDER_SITE_OTHER): Payer: Medicare Other | Admitting: Nurse Practitioner

## 2015-04-21 VITALS — BP 162/80 | HR 61 | Temp 97.9°F | Resp 18 | Ht 63.0 in | Wt 128.8 lb

## 2015-04-21 DIAGNOSIS — I10 Essential (primary) hypertension: Secondary | ICD-10-CM

## 2015-04-21 MED ORDER — LOSARTAN POTASSIUM 50 MG PO TABS: 50.0000 mg | ORAL_TABLET | Freq: Every day | ORAL | Status: DC

## 2015-04-21 MED ORDER — METOPROLOL SUCCINATE ER 100 MG PO TB24: ORAL_TABLET | ORAL | Status: DC

## 2015-04-21 NOTE — Patient Instructions (Signed)
Decrease cardura back down to 4 mg daily Will start losartan 50 mg daily for blood pressure Take blood pressure multiple times and write this down, bring blood pressure reading with you to next visit  Low sodium diet.   To follow up blood work BEFORE next visit with Dr Chilton Si in 2 weeks.

## 2015-04-21 NOTE — Progress Notes (Signed)
Patient ID: [name], female   DOB: Nov 03, 1930, 81 y.o.   MRN: [id number]    PCP: Kimber Relic, MD  Allergies  Allergen Reactions  . Ambien [Zolpidem Tartrate]     hallucination  . Bactrim [Sulfamethoxazole-Trimethoprim]   . Morphine And Related Nausea And Vomiting  . Other     z pack     Chief Complaint  Patient presents with  . Acute Visit    BP up     HPI: Patient is a 81 y.o. female seen in the office today due to elevated blood pressure. Pt with a hx of hypertension, anxiety, GERD, hypothyroid, hyperlipidemia. Pt has been going to different stores and her neighbor has been checking blood pressure, in the 159-168/88. Pt currently taking cardura 8 mg daily, metoprolol succinate 100 mg daily, and indapamide 1.25 mg daily. Pt increased cardura to 8 mg, prescribed to take 4 mg daily.  No dizziness, lightheadedness, headache, acute changes in vision. No chest pains or shortness of breath, no palpitations.  Review of Systems:  Review of Systems  Constitutional: Negative.  Negative for activity change.  Eyes: Positive for visual disturbance (none acute).       Macular degeneration.  Respiratory: Negative for cough, shortness of breath and wheezing.        History of asthma.  Cardiovascular: Negative for chest pain, palpitations and leg swelling.  Endocrine:       History hypothyroidism  Genitourinary: Negative for urgency, frequency, hematuria and flank pain.  Neurological: Negative for dizziness, light-headedness and headaches.    Past Medical History  Diagnosis Date  . Lumbago   . Personal history of fall   . Anxiety state, unspecified   . Unspecified constipation   . Open wound of toe(s), without mention of complication   . Allergic rhinitis due to pollen   . Unspecified tinnitus   . Insomnia, unspecified   . Unspecified hereditary and idiopathic peripheral neuropathy   . Insomnia, unspecified   . Diaphragmatic hernia without mention of obstruction or gangrene    . Sebaceous cyst   . Dizziness and giddiness   . Diffuse cystic mastopathy   . Osteoarthrosis, unspecified whether generalized or localized, unspecified site   . Unspecified hypothyroidism   . Other and unspecified hyperlipidemia   . Unspecified essential hypertension   . Sciatica   . Macular degeneration (senile) of retina, unspecified   . Senile osteoporosis   . Calculus of kidney   . Cataract   . GERD (gastroesophageal reflux disease)   . Hiatal hernia    Past Surgical History  Procedure Laterality Date  . Cholecystectomy  07/25/2007  . Tonsillectomy and adenoidectomy Bilateral 1943  . Cataract extraction, bilateral     Social History:   reports that she has never smoked. She does not have any smokeless tobacco history on file. She reports that she does not drink alcohol or use illicit drugs.  Family History  Problem Relation Age of Onset  . Hypertension Brother   . Fibromyalgia Daughter   . Hypertension Son   . Hypertension Brother   . Hyperlipidemia Brother     Medications: Patient's Medications  New Prescriptions   No medications on file  Previous Medications   ALPRAZOLAM (XANAX) 1 MG TABLET    One up to 3 times in 24 hours for anxiety or rest   ATORVASTATIN (LIPITOR) 20 MG TABLET    TAKE 1 TABLET BY MOUTH EVERY DAY   BEVACIZUMAB (AVASTIN) 100 MG/4ML SOLN  Inject monthly in right eye every 6 weeks.   CHLORPHENIRAMINE-HYDROCODONE (TUSSIONEX PENNKINETIC ER) 10-8 MG/5ML LQCR    Take 5 mLs by mouth every 6 (six) hours as needed (cough). Take one tsp every 12 hours as needed for cough   DOXAZOSIN (CARDURA) 4 MG TABLET    TAKE 1 TABLET BY MOUTH DAILY   INDAPAMIDE (LOZOL) 1.25 MG TABLET    TAKE 1 TABLET BY MOUTH DAILY   LEVOTHYROXINE (SYNTHROID, LEVOTHROID) 75 MCG TABLET    TAKE 1 TABLET BY MOUTH EVERY DAY FOR THYROID SUPPLEMENT   LORATADINE (CLARITIN) 10 MG TABLET    Take 10 mg by mouth daily. Take one tablet once a day as needed for allergies   METOPROLOL SUCCINATE  (TOPROL-XL) 50 MG 24 HR TABLET    Take two tablets by mouth daily with a meal to control blood pressure   PANTOPRAZOLE (PROTONIX) 40 MG TABLET    Take one tablet by mouth in the morning 30 minutes before breakfast   POLYETHYLENE GLYCOL (MIRALAX / GLYCOLAX) PACKET    Take 17 g by mouth daily. Mix 17 grams in a 8 oz glass for constipation needed   RANIBIZUMAB (LUCENTIS) 0.3 MG/0.05ML SOLN    Inject into left eye every 5 weeks.   SERTRALINE (ZOLOFT) 50 MG TABLET    Take 1 tablet (50 mg total) by mouth daily. To calm nerves   TEMAZEPAM (RESTORIL) 30 MG CAPSULE    TAKE ONE CAPSULE BY MOUTH AT BEDTIME AS NEEDED FOR SLEEP  Modified Medications   No medications on file  Discontinued Medications   No medications on file     Physical Exam:  Filed Vitals:   04/21/15 1613  BP: 162/80  Pulse: 61  Temp: 97.9 F (36.6 C)  TempSrc: Oral  Resp: 18  Height:  (1.6 m)  Weight: 128 lb 12.8 oz (58.423 kg)  SpO2: 92%    Physical Exam  Constitutional: She is oriented to person, place, and time. She appears well-developed and well-nourished. No distress.  HENT:  Head: Normocephalic and atraumatic.  Mouth/Throat: Oropharynx is clear and moist. No oropharyngeal exudate.  Eyes: Conjunctivae and EOM are normal. Pupils are equal, round, and reactive to light.  Neck: Normal range of motion. Neck supple.  Cardiovascular: Normal rate, regular rhythm and normal heart sounds.   Pulmonary/Chest: Effort normal and breath sounds normal.  Musculoskeletal: She exhibits no edema.  Neurological: She is alert and oriented to person, place, and time.  Skin: Skin is warm and dry. She is not diaphoretic.  Psychiatric: She has a normal mood and affect.    Labs reviewed: Basic Metabolic Panel:  Recent Labs  16/10/96 1018 10/24/14 0948 03/10/15 1633  NA 136 138 129*  K 4.0 4.1 3.2*  CL 92* 94* 84*  CO2 GLUCOSE 96 86 130*  BUN CREATININE 0.85 0.74 0.60  CALCIUM 9.7 9.6 9.4  TSH  2.860 3.930 3.840   Liver Function Tests:  Recent Labs  04/28/14 1018 10/24/14 0948 03/10/15 1633  AST ALT ALKPHOS 100 89 78  BILITOT 0.6 0.4 0.3  PROT 6.1 5.5* 6.5   No results for input(s): LIPASE, AMYLASE in the last 8760 hours. No results for input(s): AMMONIA in the last 8760 hours. CBC:  Recent Labs  03/10/15 1633  WBC 5.4  NEUTROABS 4.3  HCT 37.4   Lipid Panel:  Recent Labs  04/28/14 1018 10/24/14 0948  CHOL  135 127  HDL 51 46  LDLCALC 66 60  TRIG 90 105  CHOLHDL 2.6 2.8   TSH:  Recent Labs  04/28/14 1018 10/24/14 0948 03/10/15 1633  TSH 2.860 3.930 3.840   A1C: Lab Results  Component Value Date   HGBA1C  07/23/2007    5.7 (NOTE)   The ADA recommends the following therapeutic goals for glycemic   control related to Hgb A1C measurement:   Goal of Therapy:   < 7.0% Hgb A1C   Action Suggested:  > 8.0% Hgb A1C   Ref:  Diabetes Care, 22, Suppl. 1, 1999     Assessment/Plan  1. Essential hypertension -elevated blood pressure -low sodium diet -to decrease cardura back to 4 mg daily -cont indapamide (LOZOL) 1.25 MG tablet   - metoprolol succinate (TOPROL-XL) 100 MG 24 hr tablet; Take two tablets by mouth daily with a meal to control blood pressure  Dispense: 30 tablet; Refill: 3 - will start losartan (COZAAR) 50 MG tablet; Take 1 tablet (50 mg total) by mouth daily.  Dispense: 30 tablet; Refill: 0 - Basic metabolic panel in 2 weeks -to take blood pressure and bring to next visit -keep follow up with Dr Chilton Si in 2 weeks   Janene Harvey. Biagio Borg  [facility] [phone] 8 am - 5 pm) [phone] (after hours)

## 2015-05-01 ENCOUNTER — Other Ambulatory Visit: Payer: Medicare Other

## 2015-05-01 DIAGNOSIS — I10 Essential (primary) hypertension: Secondary | ICD-10-CM

## 2015-05-02 LAB — BASIC METABOLIC PANEL
BUN/Creatinine Ratio: 14 (ref 11–26)
BUN: 10 mg/dL (ref 8–27)
CO2: 31 mmol/L — AB (ref 18–29)
CREATININE: 0.73 mg/dL (ref 0.57–1.00)
Calcium: 9.1 mg/dL (ref 8.7–10.3)
Chloride: 92 mmol/L — ABNORMAL LOW (ref 97–108)
GFR calc Af Amer: 89 mL/min/{1.73_m2} (ref >59)
GFR, EST NON AFRICAN AMERICAN: 77 mL/min/{1.73_m2} (ref >59)
Glucose: 85 mg/dL (ref 65–99)
Potassium: 3.8 mmol/L (ref 3.5–5.2)
Sodium: 136 mmol/L (ref 134–144)

## 2015-05-05 ENCOUNTER — Encounter: Payer: Self-pay | Admitting: Internal Medicine

## 2015-05-05 ENCOUNTER — Ambulatory Visit (INDEPENDENT_AMBULATORY_CARE_PROVIDER_SITE_OTHER): Payer: Medicare Other | Admitting: Internal Medicine

## 2015-05-05 ENCOUNTER — Encounter: Payer: Medicare Other | Admitting: Gastroenterology

## 2015-05-05 VITALS — BP 112/84 | HR 66 | Temp 97.9°F | Resp 20 | Ht 63.0 in | Wt 132.6 lb

## 2015-05-05 DIAGNOSIS — R109 Unspecified abdominal pain: Secondary | ICD-10-CM | POA: Diagnosis not present

## 2015-05-05 DIAGNOSIS — K2100 Gastro-esophageal reflux disease with esophagitis, without bleeding: Secondary | ICD-10-CM

## 2015-05-05 DIAGNOSIS — I10 Essential (primary) hypertension: Secondary | ICD-10-CM

## 2015-05-05 DIAGNOSIS — G4700 Insomnia, unspecified: Secondary | ICD-10-CM

## 2015-05-05 MED ORDER — DEXLANSOPRAZOLE 60 MG PO CPDR: DELAYED_RELEASE_CAPSULE | ORAL | Status: DC

## 2015-05-05 MED ORDER — METOPROLOL SUCCINATE ER 100 MG PO TB24: ORAL_TABLET | ORAL | Status: DC

## 2015-05-05 MED ORDER — TEMAZEPAM 30 MG PO CAPS: 30.0000 mg | ORAL_CAPSULE | Freq: Every evening | ORAL | Status: DC | PRN

## 2015-05-05 MED ORDER — LOSARTAN POTASSIUM 50 MG PO TABS: 50.0000 mg | ORAL_TABLET | Freq: Every day | ORAL | Status: DC

## 2015-05-05 NOTE — Progress Notes (Signed)
Patient ID: [name], female   DOB: 29-May-1931, 81 y.o.   MRN: [id number]    Facility  [facility]    Place of Service:   OFFICE    Allergies  Allergen Reactions  . Ambien [Zolpidem Tartrate]     hallucination  . Bactrim [Sulfamethoxazole-Trimethoprim]   . Morphine And Related Nausea And Vomiting  . Other     z pack     Chief Complaint  Patient presents with  . Medical Management of Chronic Issues    6 month follow-up for Hypertension, Hyperlipidemia, Hypothyroidism ,  labs printed  . OTHER    Discuss Medication, and wound    HPI:  Essential hypertension - controlled  Abdominal distress - improved on pantoprazole, but insurance will not approve  Gastroesophageal reflux disease with esophagitis - as above  Insomnia - better on temazepam    Medications: Patient's Medications  New Prescriptions   No medications on file  Previous Medications   ALPRAZOLAM (XANAX) 1 MG TABLET    One up to 3 times in 24 hours for anxiety or rest   ATORVASTATIN (LIPITOR) 20 MG TABLET    TAKE 1 TABLET BY MOUTH EVERY DAY   BEVACIZUMAB (AVASTIN) 100 MG/4ML SOLN    Inject monthly in right eye every 6 weeks.   CHLORPHENIRAMINE-HYDROCODONE (TUSSIONEX PENNKINETIC ER) 10-8 MG/5ML LQCR    Take 5 mLs by mouth every 6 (six) hours as needed (cough). Take one tsp every 12 hours as needed for cough   DOXAZOSIN (CARDURA) 4 MG TABLET    TAKE 1 TABLET BY MOUTH DAILY   INDAPAMIDE (LOZOL) 1.25 MG TABLET    TAKE 1 TABLET BY MOUTH DAILY   LEVOTHYROXINE (SYNTHROID, LEVOTHROID) 75 MCG TABLET    TAKE 1 TABLET BY MOUTH EVERY DAY FOR THYROID SUPPLEMENT   LORATADINE (CLARITIN) 10 MG TABLET    Take 10 mg by mouth daily. Take one tablet once a day as needed for allergies   LOSARTAN (COZAAR) 50 MG TABLET    Take 1 tablet (50 mg total) by mouth daily.   METOPROLOL SUCCINATE (TOPROL-XL) 100 MG 24 HR TABLET    Take two tablets by mouth daily with a meal to control blood pressure   PANTOPRAZOLE (PROTONIX) 40 MG TABLET    Take  one tablet by mouth in the morning 30 minutes before breakfast   POLYETHYLENE GLYCOL (MIRALAX / GLYCOLAX) PACKET    Take 17 g by mouth daily. Mix 17 grams in a 8 oz glass for constipation needed   RANIBIZUMAB (LUCENTIS) 0.3 MG/0.05ML SOLN    Inject into left eye every 5 weeks.   SERTRALINE (ZOLOFT) 50 MG TABLET    Take 1 tablet (50 mg total) by mouth daily. To calm nerves   TEMAZEPAM (RESTORIL) 30 MG CAPSULE    TAKE ONE CAPSULE BY MOUTH AT BEDTIME AS NEEDED FOR SLEEP  Modified Medications   No medications on file  Discontinued Medications   No medications on file     Review of Systems  Constitutional: Negative.  Negative for activity change.  Eyes: Positive for visual disturbance (none acute).       Macular degeneration.  Respiratory: Negative for cough, chest tightness, shortness of breath and wheezing.        History of asthma.  Cardiovascular: Negative for chest pain, palpitations and leg swelling.  Gastrointestinal: Positive for constipation.       History of hemorrhoids. Abdominal bloating, gas, and rumbling have improved on pantoprazole.  Endocrine: Positive for polyuria.  History hypothyroidism  Genitourinary: Positive for menstrual problem. Negative for urgency, frequency, hematuria and flank pain.       History kidney stones at age 85.  Musculoskeletal: Negative for myalgias and back pain.  Skin: Negative.   Allergic/Immunologic: Negative.   Neurological: Negative.  Negative for dizziness, light-headedness and headaches.  Hematological: Negative.   Psychiatric/Behavioral: Positive for sleep disturbance and dysphoric mood.    Filed Vitals:   05/05/15 1330  BP: 112/84  Pulse: 66  Temp: 97.9 F (36.6 C)  TempSrc: Oral  Resp: 20  Height: _0  (1.6 m)  Weight: 132 lb 9.6 oz (60.147 kg)  SpO2: 95%   Body mass index is 23.49 kg/(m^2).  Physical Exam  Constitutional: She is oriented to person, place, and time. She appears well-nourished. No distress.  HENT:    Head: Normocephalic and atraumatic.  Right Ear: External ear normal.  Left Ear: External ear normal.  Nose: Nose normal.  Mouth/Throat: Oropharynx is clear and moist.  Partial hearing loss in both ears.  Eyes: Conjunctivae and EOM are normal. Pupils are equal, round, and reactive to light.  Corrective lenses.  Cardiovascular: Normal rate, regular rhythm, normal heart sounds and intact distal pulses.  Exam reveals no gallop and no friction rub.   No murmur heard. Pulmonary/Chest: No respiratory distress. She has no wheezes. She has no rales.  Abdominal: Bowel sounds are normal. She exhibits no distension and no mass. There is no tenderness.  Musculoskeletal: Normal range of motion. She exhibits no edema or tenderness.  Neurological: She is alert and oriented to person, place, and time. She has normal reflexes. No cranial nerve deficit. Coordination normal.  10/28/14 MMSE 29/30. Passed clock drawing.  Skin: No rash noted. No erythema. No pallor.  Multiple seborrheic keratoses. Skin tears of both forearms.  Psychiatric: Her behavior is normal. Judgment and thought content normal.  Anxious and possibly depressed.     Labs reviewed: Lab Summary Latest Ref Rng 05/01/2015 03/10/2015 10/24/2014  Hemoglobin 11.1 - 15.9 g/dL (None) 12.9 (None)  Hematocrit 34.0 - 46.6 % (None) 37.4 (None)  White count 3.4 - 10.8 x10E3/uL (None) 5.4 (None)  Platelet count 150 - 379 x10E3/uL (None) 158 (None)  Sodium 134 - 144 mmol/L 136 129(L) 138  Potassium 3.5 - 5.2 mmol/L 3.8 3.2(L) 4.1  Calcium 8.7 - 10.3 mg/dL 9.1 9.4 9.6  Phosphorus - (None) (None) (None)  Creatinine 0.57 - 1.00 mg/dL 0.73 0.60 0.74  AST 0 - 40 IU/L (None) 28 15  Alk Phos 39 - 117 IU/L (None) 78 89  Bilirubin 0.0 - 1.2 mg/dL (None) 0.3 0.4  Glucose 65 - 99 mg/dL 85 130(H) 86  Cholesterol - (None) (None) (None)  HDL cholesterol >39 mg/dL (None) (None) 46  Triglycerides 0 - 149 mg/dL (None) (None) 105  LDL Direct - (None) (None) (None)   LDL Calc 0 - 99 mg/dL (None) (None) 60  Total protein - (None) (None) (None)  Albumin 3.5 - 4.7 g/dL (None) 4.1 3.6   Lab Results  Component Value Date   TSH 3.840 03/10/2015   T4TOTAL 9.2 04/29/2013   Lab Results  Component Value Date   BUN 10 05/01/2015   Lab Results  Component Value Date   HGBA1C  07/23/2007    5.7 (NOTE)   The ADA recommends the following therapeutic goals for glycemic   control related to Hgb A1C measurement:   Goal of Therapy:   < 7.0% Hgb A1C   Action Suggested:  > 8.0% Hgb  A1C   Ref:  Diabetes Care, 22, Suppl. 1, 1999       Assessment/Plan  1. Essential hypertension Continue indapamide - metoprolol succinate (TOPROL-XL) 100 MG 24 hr tablet; Take two tablets by mouth daily with a meal to control blood pressure  Dispense: 90 tablet; Refill: 3 - losartan (COZAAR) 50 MG tablet; Take 1 tablet (50 mg total) by mouth daily.  Dispense: 90 tablet; Refill: 3  2. Abdominal distress Improved on pantoprazole, but insurance did not approve - dexlansoprazole (DEXILANT) 60 MG capsule; One daily to reduce stomach acid  Dispense: 90 capsule; Refill: 3  3. Gastroesophageal reflux disease with esophagitis Improved on pantoprazole, but not omeprazole - dexlansoprazole (DEXILANT) 60 MG capsule; One daily to reduce stomach acid  Dispense: 90 capsule; Refill: 3  4. Insomnia - temazepam (RESTORIL) 30 MG capsule; Take 1 capsule (30 mg total) by mouth at bedtime as needed. for sleep  Dispense: 30 capsule; Refill: 0

## 2015-05-06 ENCOUNTER — Other Ambulatory Visit: Payer: Self-pay | Admitting: *Deleted

## 2015-05-06 DIAGNOSIS — I10 Essential (primary) hypertension: Secondary | ICD-10-CM

## 2015-05-06 MED ORDER — METOPROLOL SUCCINATE ER 100 MG PO TB24: ORAL_TABLET | ORAL | Status: DC

## 2015-05-07 ENCOUNTER — Other Ambulatory Visit: Payer: Self-pay | Admitting: *Deleted

## 2015-05-07 MED ORDER — PANTOPRAZOLE SODIUM 40 MG PO TBEC: DELAYED_RELEASE_TABLET | ORAL | Status: DC

## 2015-05-07 NOTE — Telephone Encounter (Signed)
Dr. Chilton Si not sure or not if this medication requires Prior Authorization.  Sent to pharmacy for determination.

## 2015-05-11 ENCOUNTER — Other Ambulatory Visit: Payer: Self-pay | Admitting: *Deleted

## 2015-05-11 MED ORDER — PANTOPRAZOLE SODIUM 40 MG PO TBEC: DELAYED_RELEASE_TABLET | ORAL | Status: DC

## 2015-05-11 MED ORDER — SERTRALINE HCL 50 MG PO TABS: 50.0000 mg | ORAL_TABLET | Freq: Every day | ORAL | Status: DC

## 2015-05-11 NOTE — Telephone Encounter (Signed)
Prior Authorization for Pantoprazole  initiated and approved till 05/10/2016. Called [phone] and spoke with [place]. Patient notified and agreed.

## 2015-05-11 NOTE — Telephone Encounter (Signed)
[facility] 

## 2015-05-14 DIAGNOSIS — L820 Inflamed seborrheic keratosis: Secondary | ICD-10-CM | POA: Diagnosis not present

## 2015-05-18 ENCOUNTER — Ambulatory Visit (INDEPENDENT_AMBULATORY_CARE_PROVIDER_SITE_OTHER): Payer: Medicare Other

## 2015-05-18 DIAGNOSIS — Z23 Encounter for immunization: Secondary | ICD-10-CM | POA: Diagnosis not present

## 2015-05-20 DIAGNOSIS — H353221 Exudative age-related macular degeneration, left eye, with active choroidal neovascularization: Secondary | ICD-10-CM | POA: Diagnosis not present

## 2015-06-02 ENCOUNTER — Other Ambulatory Visit: Payer: Self-pay | Admitting: Internal Medicine

## 2015-06-05 ENCOUNTER — Other Ambulatory Visit: Payer: Self-pay

## 2015-06-05 ENCOUNTER — Other Ambulatory Visit: Payer: Self-pay | Admitting: Internal Medicine

## 2015-06-05 DIAGNOSIS — Z1231 Encounter for screening mammogram for malignant neoplasm of breast: Secondary | ICD-10-CM

## 2015-06-12 ENCOUNTER — Other Ambulatory Visit: Payer: Self-pay | Admitting: Internal Medicine

## 2015-06-15 DIAGNOSIS — H353211 Exudative age-related macular degeneration, right eye, with active choroidal neovascularization: Secondary | ICD-10-CM | POA: Diagnosis not present

## 2015-06-24 DIAGNOSIS — H353221 Exudative age-related macular degeneration, left eye, with active choroidal neovascularization: Secondary | ICD-10-CM | POA: Diagnosis not present

## 2015-07-02 ENCOUNTER — Encounter: Payer: Self-pay | Admitting: Nurse Practitioner

## 2015-07-02 ENCOUNTER — Ambulatory Visit
Admission: RE | Admit: 2015-07-02 | Discharge: 2015-07-02 | Disposition: A | Discharge status: Home / Self Care | Payer: Medicare Other | Source: Ambulatory Visit | Attending: Nurse Practitioner | Admitting: Nurse Practitioner

## 2015-07-02 ENCOUNTER — Ambulatory Visit (INDEPENDENT_AMBULATORY_CARE_PROVIDER_SITE_OTHER): Payer: Medicare Other | Admitting: Nurse Practitioner

## 2015-07-02 VITALS — BP 142/80 | HR 60 | Temp 97.5°F | Resp 14 | Ht 63.0 in | Wt 139.0 lb

## 2015-07-02 DIAGNOSIS — J209 Acute bronchitis, unspecified: Secondary | ICD-10-CM

## 2015-07-02 DIAGNOSIS — R05 Cough: Secondary | ICD-10-CM | POA: Diagnosis not present

## 2015-07-02 MED ORDER — DOXYCYCLINE HYCLATE 100 MG PO TABS: 100.0000 mg | ORAL_TABLET | Freq: Two times a day (BID) | ORAL | Status: DC

## 2015-07-02 NOTE — Progress Notes (Signed)
Patient ID: [name], female   DOB: 25-May-1931, 81 y.o.   MRN: [id number]    PCP: Kimber Relic, MD  Advanced Directive information    Allergies  Allergen Reactions  . Ambien [Zolpidem Tartrate]     hallucination  . Bactrim [Sulfamethoxazole-Trimethoprim]   . Morphine And Related Nausea And Vomiting  . Other     z pack     Chief Complaint  Patient presents with  . Acute Visit    URI- cough, wheezing, discolored congestion (green) x 1 week. Taking cough medication (prescribed) and using tessalon pearls(husbands rx)     HPI: Patient is a 81 y.o. female seen in the office today noted wheezing that started last week. Then dry cough and dry throat, no pain in throat.  Now with hacking cough and congestion in her chest. Green sputum. deep cough. Had post nasal drip and rhinorrhea in the beginning. Coughing throughout the day Tessalon pearls help also has tussinex cough syrup which has help  No fever. Decreased energy. Feeling poorly Using saline spray to nose which has helps nasal congestion.  Eating and drinking without difficulty.  Started using mucinex DM today.    Review of Systems:  Review of Systems  Constitutional: Positive for fatigue. Negative for fever, activity change and appetite change.  HENT: Positive for congestion (chest ). Negative for ear pain, postnasal drip, rhinorrhea, sinus pressure and sore throat.   Respiratory: Positive for cough and wheezing. Negative for shortness of breath.   Cardiovascular: Negative for chest pain.  Neurological: Negative for dizziness, weakness, light-headedness and headaches.    Past Medical History  Diagnosis Date  . Lumbago   . Personal history of fall   . Anxiety state, unspecified   . Unspecified constipation   . Open wound of toe(s), without mention of complication   . Allergic rhinitis due to pollen   . Unspecified tinnitus   . Insomnia, unspecified   . Unspecified hereditary and idiopathic peripheral  neuropathy   . Insomnia, unspecified   . Diaphragmatic hernia without mention of obstruction or gangrene   . Sebaceous cyst   . Dizziness and giddiness   . Diffuse cystic mastopathy   . Osteoarthrosis, unspecified whether generalized or localized, unspecified site   . Unspecified hypothyroidism   . Other and unspecified hyperlipidemia   . Unspecified essential hypertension   . Sciatica   . Macular degeneration (senile) of retina, unspecified   . Senile osteoporosis   . Calculus of kidney   . Cataract   . GERD (gastroesophageal reflux disease)   . Hiatal hernia    Past Surgical History  Procedure Laterality Date  . Cholecystectomy  07/25/2007  . Tonsillectomy and adenoidectomy Bilateral 1943  . Cataract extraction, bilateral     Social History:   reports that she has never smoked. She has never used smokeless tobacco. She reports that she does not drink alcohol or use illicit drugs.  Family History  Problem Relation Age of Onset  . Hypertension Brother   . Fibromyalgia Daughter   . Hypertension Son   . Hypertension Brother   . Hyperlipidemia Brother     Medications: Patient's Medications  New Prescriptions   No medications on file  Previous Medications   ALPRAZOLAM (XANAX) 1 MG TABLET    TAKE 1 TABLET BY MOUTH UP TO 3 TIMES A DAY FOR ANXIETY OR REST   ATORVASTATIN (LIPITOR) 20 MG TABLET    TAKE 1 TABLET BY MOUTH EVERY DAY   BEVACIZUMAB (AVASTIN)  100 MG/4ML SOLN    Inject monthly in right eye every 6 weeks.   CHLORPHENIRAMINE-HYDROCODONE (TUSSIONEX PENNKINETIC ER) 10-8 MG/5ML LQCR    Take 5 mLs by mouth every 6 (six) hours as needed (cough). Take one tsp every 12 hours as needed for cough   DOXAZOSIN (CARDURA) 4 MG TABLET    TAKE 1 TABLET BY MOUTH DAILY   INDAPAMIDE (LOZOL) 1.25 MG TABLET    TAKE 1 TABLET BY MOUTH DAILY   LEVOTHYROXINE (SYNTHROID, LEVOTHROID) 75 MCG TABLET    TAKE 1 TABLET BY MOUTH EVERY DAY FOR THYROID SUPPLEMENT   LORATADINE (CLARITIN) 10 MG TABLET     Take 10 mg by mouth daily. Take one tablet once a day as needed for allergies   LOSARTAN (COZAAR) 50 MG TABLET    Take 1 tablet (50 mg total) by mouth daily.   METOPROLOL SUCCINATE (TOPROL-XL) 100 MG 24 HR TABLET    Take 1 tablet by mouth daily with a meal to control blood pressure.   PANTOPRAZOLE (PROTONIX) 40 MG TABLET    Take one tablet by mouth in the morning 30 minutes before breakfast for stomach   POLYETHYLENE GLYCOL (MIRALAX / GLYCOLAX) PACKET    Take 17 g by mouth daily. Mix 17 grams in a 8 oz glass for constipation needed   RANIBIZUMAB (LUCENTIS) 0.3 MG/0.05ML SOLN    Inject into left eye every 5 weeks.   SERTRALINE (ZOLOFT) 50 MG TABLET    Take 1 tablet (50 mg total) by mouth daily. To calm nerves   TEMAZEPAM (RESTORIL) 30 MG CAPSULE    Take 1 capsule (30 mg total) by mouth at bedtime as needed. for sleep  Modified Medications   No medications on file  Discontinued Medications   No medications on file     Physical Exam:  Filed Vitals:   07/02/15 1519  BP: 142/80  Pulse: 60  Temp: 97.5 F (36.4 C)  TempSrc: Oral  Resp: 14  Height: 5\' 3"  (1.6 m)  Weight: 139 lb (63.05 kg)  SpO2: 93%   Body mass index is 24.63 kg/(m^2).  Physical Exam  Constitutional: She is oriented to person, place, and time. She appears well-developed and well-nourished. No distress.  HENT:  Head: Normocephalic and atraumatic.  Right Ear: External ear normal.  Left Ear: External ear normal.  Nose: Nose normal.  Mouth/Throat: Oropharynx is clear and moist. No oropharyngeal exudate.  Eyes: Conjunctivae and EOM are normal. Pupils are equal, round, and reactive to light.  Neck: Normal range of motion. Neck supple. No thyromegaly present.  Cardiovascular: Normal rate, regular rhythm and normal heart sounds.   Pulmonary/Chest: Effort normal.  Diminished breath sounds on left   Lymphadenopathy:    She has no cervical adenopathy.  Neurological: She is alert and oriented to person, place, and time.    Skin: Skin is warm and dry. She is not diaphoretic.  Psychiatric: She has a normal mood and affect.    Labs reviewed: Basic Metabolic Panel:  Recent Labs  08/16/7201/11/16 0948 03/10/15 1633 05/01/15 0942  NA 138 129* 136  K 4.1 3.2* 3.8  CL 94* 84* 92*  CO2 29 29 31*  GLUCOSE 86 130* 85  BUN 12 11 10   CREATININE 0.74 0.60 0.73  CALCIUM 9.6 9.4 9.1  TSH 3.930 3.840  --    Liver Function Tests:  Recent Labs  10/24/14 0948 03/10/15 1633  AST 15 28  ALT 13 28  ALKPHOS 89 78  BILITOT 0.4 0.3  PROT 5.5* 6.5  ALBUMIN 3.6 4.1   No results for input(s): LIPASE, AMYLASE in the last 8760 hours. No results for input(s): AMMONIA in the last 8760 hours. CBC:  Recent Labs  03/10/15 1633  WBC 5.4  NEUTROABS 4.3  HCT 37.4   Lipid Panel:  Recent Labs  10/24/14 0948  CHOL 127  HDL 46  LDLCALC 60  TRIG 105  CHOLHDL 2.8   TSH:  Recent Labs  10/24/14 0948 03/10/15 1633  TSH 3.930 3.840   A1C: Lab Results  Component Value Date   HGBA1C  07/23/2007    5.7 (NOTE)   The ADA recommends the following therapeutic goals for glycemic   control related to Hgb A1C measurement:   Goal of Therapy:   < 7.0% Hgb A1C   Action Suggested:  > 8.0% Hgb A1C   Ref:  Diabetes Care, 22, Suppl. 1, 1999     Assessment/Plan 1. Acute bronchitis, unspecified organism -cont mucinex DM twice daily for 7 days then as needed -encouraged hydration and maintain proper nutrition.  - doxycycline (VIBRA-TABS) 100 MG tablet; Take 1 tablet (100 mg total) by mouth 2 (two) times daily.  Dispense: 14 tablet; Refill: 0 - DG Chest 2 View rule out pneumonia  -return precautions discussed, pt understands   Maeli Spacek K. Biagio Borg  [facility] [phone] 8 am - 5 pm) [phone] (after hours)

## 2015-07-02 NOTE — Patient Instructions (Signed)
To start doxycyline 100 mg by mouth twice daily for 1 week Cont mucinex DM twice daily for 1 week then as needed for cough and congestion To increase water intake- make sure to drink full glass of water when you take mucinex  To get chest xray to rule out pneumonia  Acute Bronchitis Bronchitis is inflammation of the airways that extend from the windpipe into the lungs (bronchi). The inflammation often causes mucus to develop. This leads to a cough, which is the most common symptom of bronchitis.  In acute bronchitis, the condition usually develops suddenly and goes away over time, usually in a couple weeks. Smoking, allergies, and asthma can make bronchitis worse. Repeated episodes of bronchitis may cause further lung problems.  CAUSES Acute bronchitis is most often caused by the same virus that causes a cold. The virus can spread from person to person (contagious) through coughing, sneezing, and touching contaminated objects. SIGNS AND SYMPTOMS   Cough.   Fever.   Coughing up mucus.   Body aches.   Chest congestion.   Chills.   Shortness of breath.   Sore throat.  DIAGNOSIS  Acute bronchitis is usually diagnosed through a physical exam. Your health care provider will also ask you questions about your medical history. Tests, such as chest X-rays, are sometimes done to rule out other conditions.  TREATMENT  Acute bronchitis usually goes away in a couple weeks. Oftentimes, no medical treatment is necessary. Medicines are sometimes given for relief of fever or cough. Antibiotic medicines are usually not needed but may be prescribed in certain situations. In some cases, an inhaler may be recommended to help reduce shortness of breath and control the cough. A cool mist vaporizer may also be used to help thin bronchial secretions and make it easier to clear the chest.  HOME CARE INSTRUCTIONS  Get plenty of rest.   Drink enough fluids to keep your urine clear or pale yellow  (unless you have a medical condition that requires fluid restriction). Increasing fluids may help thin your respiratory secretions (sputum) and reduce chest congestion, and it will prevent dehydration.   Take medicines only as directed by your health care provider.  If you were prescribed an antibiotic medicine, finish it all even if you start to feel better.  Avoid smoking and secondhand smoke. Exposure to cigarette smoke or irritating chemicals will make bronchitis worse. If you are a smoker, consider using nicotine gum or skin patches to help control withdrawal symptoms. Quitting smoking will help your lungs heal faster.   Reduce the chances of another bout of acute bronchitis by washing your hands frequently, avoiding people with cold symptoms, and trying not to touch your hands to your mouth, nose, or eyes.   Keep all follow-up visits as directed by your health care provider.  SEEK MEDICAL CARE IF: Your symptoms do not improve after 1 week of treatment.  SEEK IMMEDIATE MEDICAL CARE IF:  You develop an increased fever or chills.   You have chest pain.   You have severe shortness of breath.  You have bloody sputum.   You develop dehydration.  You faint or repeatedly feel like you are going to pass out.  You develop repeated vomiting.  You develop a severe headache. MAKE SURE YOU:   Understand these instructions.  Will watch your condition.  Will get help right away if you are not doing well or get worse.   This information is not intended to replace advice given to you by your  health care provider. Make sure you discuss any questions you have with your health care provider.   Document Released: 09/08/2004 Document Revised: 08/22/2014 Document Reviewed: 01/22/2013 Elsevier Interactive Patient Education [employer].

## 2015-07-08 ENCOUNTER — Other Ambulatory Visit: Payer: Self-pay | Admitting: Internal Medicine

## 2015-07-12 ENCOUNTER — Other Ambulatory Visit: Payer: Self-pay | Admitting: Internal Medicine

## 2015-07-15 ENCOUNTER — Ambulatory Visit
Admission: RE | Admit: 2015-07-15 | Discharge: 2015-07-15 | Disposition: A | Discharge status: Home / Self Care | Payer: Medicare Other | Source: Ambulatory Visit

## 2015-07-15 DIAGNOSIS — Z1231 Encounter for screening mammogram for malignant neoplasm of breast: Secondary | ICD-10-CM | POA: Diagnosis not present

## 2015-07-29 DIAGNOSIS — H353221 Exudative age-related macular degeneration, left eye, with active choroidal neovascularization: Secondary | ICD-10-CM | POA: Diagnosis not present

## 2015-07-30 ENCOUNTER — Other Ambulatory Visit: Payer: Medicare Other

## 2015-07-30 DIAGNOSIS — E785 Hyperlipidemia, unspecified: Secondary | ICD-10-CM | POA: Diagnosis not present

## 2015-07-30 DIAGNOSIS — I10 Essential (primary) hypertension: Secondary | ICD-10-CM | POA: Diagnosis not present

## 2015-07-30 DIAGNOSIS — E039 Hypothyroidism, unspecified: Secondary | ICD-10-CM

## 2015-07-31 LAB — COMPREHENSIVE METABOLIC PANEL
A/G RATIO: 1.7 (ref 1.1–2.5)
ALT: 18 IU/L (ref 0–32)
AST: 21 IU/L (ref 0–40)
Albumin: 3.8 g/dL (ref 3.5–4.7)
Alkaline Phosphatase: 106 IU/L (ref 39–117)
BUN/Creatinine Ratio: 17 (ref 11–26)
BUN: 12 mg/dL (ref 8–27)
Bilirubin Total: 0.5 mg/dL (ref 0.0–1.2)
CALCIUM: 9 mg/dL (ref 8.7–10.3)
CO2: 33 mmol/L — ABNORMAL HIGH (ref 18–29)
CREATININE: 0.69 mg/dL (ref 0.57–1.00)
Chloride: 92 mmol/L — ABNORMAL LOW (ref 96–106)
GFR, EST AFRICAN AMERICAN: 94 mL/min/{1.73_m2} (ref >59)
GFR, EST NON AFRICAN AMERICAN: 81 mL/min/{1.73_m2} (ref >59)
GLUCOSE: 92 mg/dL (ref 65–99)
Globulin, Total: 2.3 g/dL (ref 1.5–4.5)
Potassium: 3.6 mmol/L (ref 3.5–5.2)
Sodium: 137 mmol/L (ref 134–144)
TOTAL PROTEIN: 6.1 g/dL (ref 6.0–8.5)

## 2015-07-31 LAB — LIPID PANEL
CHOLESTEROL TOTAL: 131 mg/dL (ref 100–199)
Chol/HDL Ratio: 2.2 ratio units (ref 0.0–4.4)
HDL: 59 mg/dL (ref >39)
LDL Calculated: 57 mg/dL (ref 0–99)
Triglycerides: 77 mg/dL (ref 0–149)
VLDL CHOLESTEROL CAL: 15 mg/dL (ref 5–40)

## 2015-07-31 LAB — TSH: TSH: 8.05 u[IU]/mL — ABNORMAL HIGH (ref 0.450–4.500)

## 2015-08-02 ENCOUNTER — Other Ambulatory Visit: Payer: Self-pay | Admitting: Internal Medicine

## 2015-08-04 ENCOUNTER — Ambulatory Visit: Payer: Medicare Other | Admitting: Internal Medicine

## 2015-08-11 ENCOUNTER — Ambulatory Visit (INDEPENDENT_AMBULATORY_CARE_PROVIDER_SITE_OTHER): Payer: Medicare Other | Admitting: Internal Medicine

## 2015-08-11 ENCOUNTER — Encounter: Payer: Self-pay | Admitting: Internal Medicine

## 2015-08-11 VITALS — BP 140/70 | HR 58 | Temp 97.9°F | Ht 63.0 in | Wt 136.2 lb

## 2015-08-11 DIAGNOSIS — K2100 Gastro-esophageal reflux disease with esophagitis, without bleeding: Secondary | ICD-10-CM

## 2015-08-11 DIAGNOSIS — M6281 Muscle weakness (generalized): Secondary | ICD-10-CM | POA: Diagnosis not present

## 2015-08-11 DIAGNOSIS — G4700 Insomnia, unspecified: Secondary | ICD-10-CM

## 2015-08-11 DIAGNOSIS — F411 Generalized anxiety disorder: Secondary | ICD-10-CM | POA: Diagnosis not present

## 2015-08-11 DIAGNOSIS — R269 Unspecified abnormalities of gait and mobility: Secondary | ICD-10-CM | POA: Diagnosis not present

## 2015-08-11 DIAGNOSIS — R609 Edema, unspecified: Secondary | ICD-10-CM | POA: Diagnosis not present

## 2015-08-11 DIAGNOSIS — Z23 Encounter for immunization: Secondary | ICD-10-CM | POA: Diagnosis not present

## 2015-08-11 DIAGNOSIS — H3530 Unspecified macular degeneration: Secondary | ICD-10-CM

## 2015-08-11 DIAGNOSIS — E785 Hyperlipidemia, unspecified: Secondary | ICD-10-CM

## 2015-08-11 DIAGNOSIS — M21619 Bunion of unspecified foot: Secondary | ICD-10-CM | POA: Insufficient documentation

## 2015-08-11 DIAGNOSIS — E039 Hypothyroidism, unspecified: Secondary | ICD-10-CM

## 2015-08-11 DIAGNOSIS — I10 Essential (primary) hypertension: Secondary | ICD-10-CM

## 2015-08-11 MED ORDER — ZOSTER VACCINE LIVE 19400 UNT/0.65ML ~~LOC~~ SOLR: 0.6500 mL | Freq: Once | SUBCUTANEOUS | Status: DC

## 2015-08-11 NOTE — Patient Instructions (Addendum)
Bring copy of Advance Directives- Health Care Power of Attorney and/or Living Will to next appointment.  Try zipper compression stockings to help leg edema.

## 2015-08-11 NOTE — Progress Notes (Signed)
Patient ID: [name], female   DOB: [birth year], 81 y.o.   MRN: [id number]    Facility  [facility]    Place of Service:   OFFICE    Allergies  Allergen Reactions  . Ambien [Zolpidem Tartrate]     hallucination  . Bactrim [Sulfamethoxazole-Trimethoprim]   . Morphine And Related Nausea And Vomiting  . Other     z pack     Chief Complaint  Patient presents with  . Medical Management of Chronic Issues    3 month follow-up, discuss labs (copy printed)   . Immunizations    Prevnar 13 today, RX for shingles vaccine printed   . Other    Refused BMD    HPI:  Essential hypertension - adequately controlled  Hyperlipidemia - controlled  Hypothyroidism, unspecified hypothyroidism type - Elevated TSH to 8 since last visit. Patient says she has not run out of medication. She is not skipping doses.  Macular degeneration (senile) of retina - increasing visual problems in the left eye centrally. She is currently getting Avastin in the right eye and loosen this injections in the left eye   Anxiety state - improved since her last visit.   Gastroesophageal reflux disease with esophagitis - asymptomatic on current medication   Insomnia - using Xanax to assist sleeping   Abnormality of gait - more unsteady when walking. Having to push off from a chair. Experiencing some discomfort in the knees as well as weakness in the quadriceps bilaterally    Edema, unspecified type - compression stockings help, but they're difficult along  Bunion - both great toes. She has sponge foam pads in between the great toe and second toe bilaterally in order to prevent the great toenail from digging in to the second toe.  Quadriceps weakness - has to push off from a seated position even in a chair with a straight back and arms.  Need for vaccination with 13-polyvalent pneumococcal conjugate vaccine - Plan: Pneumococcal conjugate vaccine 13-valent    Medications: Patient's Medications  New Prescriptions   No medications on file  Previous Medications   ALPRAZOLAM (XANAX) 1 MG TABLET    TAKE 1 TABLET BY MOUTH UP TO 3 TIMES A DAY FOR ANXIETY OR REST   ATORVASTATIN (LIPITOR) 20 MG TABLET    TAKE 1 TABLET BY MOUTH EVERY DAY   BEVACIZUMAB (AVASTIN) 100 MG/4ML SOLN    Inject monthly in right eye every 6 weeks.   DOXAZOSIN (CARDURA) 4 MG TABLET    TAKE 1 TABLET BY MOUTH DAILY   INDAPAMIDE (LOZOL) 1.25 MG TABLET    TAKE 1 TABLET BY MOUTH DAILY   LEVOTHYROXINE (SYNTHROID, LEVOTHROID) 75 MCG TABLET    TAKE 1 TABLET BY MOUTH EVERY DAY FOR THYROID SUPPLEMENT   LORATADINE (CLARITIN) 10 MG TABLET    Take 10 mg by mouth daily. Take one tablet once a day as needed for allergies   LOSARTAN (COZAAR) 50 MG TABLET    Take 1 tablet (50 mg total) by mouth daily.   METOPROLOL SUCCINATE (TOPROL-XL) 100 MG 24 HR TABLET    Take 1 tablet by mouth daily with a meal to control blood pressure.   PANTOPRAZOLE (PROTONIX) 40 MG TABLET    Take one tablet by mouth in the morning 30 minutes before breakfast for stomach   POLYETHYLENE GLYCOL (MIRALAX / GLYCOLAX) PACKET    Take 17 g by mouth daily. Mix 17 grams in a 8 oz glass for constipation needed   RANIBIZUMAB (LUCENTIS) 0.3  MG/0.05ML SOLN    Inject into left eye every 5 weeks.   SERTRALINE (ZOLOFT) 50 MG TABLET    Take 1 tablet (50 mg total) by mouth daily. To calm nerves   TEMAZEPAM (RESTORIL) 30 MG CAPSULE    Take 1 capsule (30 mg total) by mouth at bedtime as needed. for sleep  Modified Medications   Modified Medication Previous Medication   ZOSTER VACCINE LIVE, PF, (ZOSTAVAX) 65784 UNT/0.65ML INJECTION zoster vaccine live, PF, (ZOSTAVAX) 69629 UNT/0.65ML injection      Inject 19,400 Units into the skin once.    Inject 0.65 mLs into the skin once.  Discontinued Medications   CHLORPHENIRAMINE-HYDROCODONE (TUSSIONEX PENNKINETIC ER) 10-8 MG/5ML LQCR    Take 5 mLs by mouth every 6 (six) hours as needed (cough). Take one tsp every 12 hours as needed for cough   DOXYCYCLINE  (VIBRA-TABS) 100 MG TABLET    Take 1 tablet (100 mg total) by mouth 2 (two) times daily.    Review of Systems  Constitutional: Negative.  Negative for activity change.  Eyes: Positive for visual disturbance (none acute).       Macular degeneration.  Respiratory: Negative for cough, chest tightness, shortness of breath and wheezing.        History of asthma.  Cardiovascular: Negative for chest pain, palpitations and leg swelling.  Gastrointestinal: Positive for constipation.       History of hemorrhoids. Abdominal bloating, gas, and rumbling have improved on pantoprazole.  Endocrine: Positive for polyuria.       History hypothyroidism  Genitourinary: Positive for menstrual problem. Negative for urgency, frequency, hematuria and flank pain.       History kidney stones at age 42.  Musculoskeletal: Negative for myalgias and back pain.  Skin: Negative.   Allergic/Immunologic: Negative.   Neurological: Negative.  Negative for dizziness, light-headedness and headaches.  Hematological: Negative.   Psychiatric/Behavioral: Positive for sleep disturbance and dysphoric mood.    Filed Vitals:   08/11/15 1317  BP: 140/70  Pulse: 58  Temp: 97.9 F (36.6 C)  TempSrc: Oral  Height: 5' 3"  (1.6 m)  Weight: 136 lb 3.2 oz (61.78 kg)  SpO2: 95%   Body mass index is 24.13 kg/(m^2).  Physical Exam  Constitutional: She is oriented to person, place, and time. She appears well-nourished. No distress.  HENT:  Head: Normocephalic and atraumatic.  Right Ear: External ear normal.  Left Ear: External ear normal.  Nose: Nose normal.  Mouth/Throat: Oropharynx is clear and moist.  Partial hearing loss in both ears.  Eyes: Conjunctivae and EOM are normal. Pupils are equal, round, and reactive to light.  Corrective lenses.  Cardiovascular: Normal rate, regular rhythm, normal heart sounds and intact distal pulses.  Exam reveals no gallop and no friction rub.   No murmur heard. Pulmonary/Chest: No  respiratory distress. She has no wheezes. She has no rales.  Abdominal: Bowel sounds are normal. She exhibits no distension and no mass. There is no tenderness.  Musculoskeletal: Normal range of motion. She exhibits no edema or tenderness.  Neurological: She is alert and oriented to person, place, and time. She has normal reflexes. No cranial nerve deficit. Coordination normal.  10/28/14 MMSE 29/30. Passed clock drawing. Numbness in the feet has progressed from the toes to about halfway up the foot. She thinks this may impair her gait.  Skin: No rash noted. No erythema. No pallor.  Multiple seborrheic keratoses. Skin tears of both forearms.  Psychiatric: Her behavior is normal. Judgment and thought content  normal.  Anxious and possibly depressed.    Labs reviewed: Lab Summary Latest Ref Rng 07/30/2015 05/01/2015 03/10/2015  Hemoglobin 11.1 - 15.9 g/dL (None) (None) 12.9  Hematocrit 34.0 - 46.6 % (None) (None) 37.4  White count 3.4 - 10.8 x10E3/uL (None) (None) 5.4  Platelet count 150 - 379 x10E3/uL (None) (None) 158  Sodium 134 - 144 mmol/L 137 136 129(L)  Potassium 3.5 - 5.2 mmol/L 3.6 3.8 3.2(L)  Calcium 8.7 - 10.3 mg/dL 9.0 9.1 9.4  Phosphorus - (None) (None) (None)  Creatinine 0.57 - 1.00 mg/dL 0.69 0.73 0.60  AST 0 - 40 IU/L 21 (None) 28  Alk Phos 39 - 117 IU/L 106 (None) 78  Bilirubin 0.0 - 1.2 mg/dL 0.5 (None) 0.3  Glucose 65 - 99 mg/dL 92 85 130(H)  Cholesterol - (None) (None) (None)  HDL cholesterol >39 mg/dL 59 (None) (None)  Triglycerides 0 - 149 mg/dL 77 (None) (None)  LDL Direct - (None) (None) (None)  LDL Calc 0 - 99 mg/dL 57 (None) (None)  Total protein - (None) (None) (None)  Albumin 3.5 - 4.7 g/dL 3.8 (None) 4.1   Lab Results  Component Value Date   TSH 8.050* 07/30/2015   TSH 3.840 03/10/2015   TSH 3.930 10/24/2014   T4TOTAL 9.2 04/29/2013   Lab Results  Component Value Date   BUN 12 07/30/2015   BUN 10 05/01/2015   BUN 11 03/10/2015   Lab Results    Component Value Date   HGBA1C  07/23/2007    5.7 (NOTE)   The ADA recommends the following therapeutic goals for glycemic   control related to Hgb A1C measurement:   Goal of Therapy:   < 7.0% Hgb A1C   Action Suggested:  > 8.0% Hgb A1C   Ref:  Diabetes Care, 22, Suppl. 1, 1999    Assessment/Plan  1. Essential hypertension -  Continue current medications. Patient has left noted some bradycardia, but tolerates it well -.Basic metabolic panel; Future  2. Hyperlipidemia Controlled. I think this can be checked once or twice yearly now.   3. Hypothyroidism, unspecified hypothyroidism type Continue current dose levothyroxine  - TSH; Future  4. Macular degeneration (senile) of retina Continue with current injections with ophthalmologist, Dr. Zadie Rhine   5. Anxiety state  improved on current medication   6. Gastroesophageal reflux disease  asymptomatic on current medication  7. Insomnia Improved on current medications. Says she is getting 6 hours of sleep per night   8. Abnormality of gait Encouraged her to get a cane. Instructed in quadriceps strengthening exercises. Discussed possible referral to physical therapy.   9. Edema, unspecified type Continue compression stockings. Try the zipper compression stockings.   10. Bunion Continue pads between her toes   11. Quadriceps weakness Instructed in quadriceps strengthening   12. Need for vaccination with 13-polyvalent pneumococcal conjugate vaccine - Pneumococcal conjugate vaccine 13-valent

## 2015-08-18 DIAGNOSIS — H353211 Exudative age-related macular degeneration, right eye, with active choroidal neovascularization: Secondary | ICD-10-CM | POA: Diagnosis not present

## 2015-09-01 ENCOUNTER — Other Ambulatory Visit: Payer: Self-pay | Admitting: Internal Medicine

## 2015-09-02 ENCOUNTER — Other Ambulatory Visit: Payer: Self-pay | Admitting: *Deleted

## 2015-09-02 MED ORDER — PANTOPRAZOLE SODIUM 40 MG PO TBEC: DELAYED_RELEASE_TABLET | ORAL | Status: DC

## 2015-09-02 NOTE — Telephone Encounter (Signed)
[facility]. Requested 90 day supply

## 2015-09-10 DIAGNOSIS — H353221 Exudative age-related macular degeneration, left eye, with active choroidal neovascularization: Secondary | ICD-10-CM | POA: Diagnosis not present

## 2015-09-30 ENCOUNTER — Other Ambulatory Visit: Payer: Self-pay | Admitting: Internal Medicine

## 2015-10-10 ENCOUNTER — Other Ambulatory Visit: Payer: Self-pay | Admitting: Internal Medicine

## 2015-10-19 DIAGNOSIS — H35352 Cystoid macular degeneration, left eye: Secondary | ICD-10-CM | POA: Diagnosis not present

## 2015-10-19 DIAGNOSIS — H353221 Exudative age-related macular degeneration, left eye, with active choroidal neovascularization: Secondary | ICD-10-CM | POA: Diagnosis not present

## 2015-10-19 DIAGNOSIS — H353211 Exudative age-related macular degeneration, right eye, with active choroidal neovascularization: Secondary | ICD-10-CM | POA: Diagnosis not present

## 2015-10-19 DIAGNOSIS — H353134 Nonexudative age-related macular degeneration, bilateral, advanced atrophic with subfoveal involvement: Secondary | ICD-10-CM | POA: Diagnosis not present

## 2015-10-26 ENCOUNTER — Other Ambulatory Visit: Payer: Self-pay | Admitting: Internal Medicine

## 2015-10-27 NOTE — Telephone Encounter (Signed)
rx called into pharmacy

## 2015-10-28 DIAGNOSIS — H353134 Nonexudative age-related macular degeneration, bilateral, advanced atrophic with subfoveal involvement: Secondary | ICD-10-CM | POA: Diagnosis not present

## 2015-10-28 DIAGNOSIS — H35352 Cystoid macular degeneration, left eye: Secondary | ICD-10-CM | POA: Diagnosis not present

## 2015-10-28 DIAGNOSIS — H353221 Exudative age-related macular degeneration, left eye, with active choroidal neovascularization: Secondary | ICD-10-CM | POA: Diagnosis not present

## 2015-10-28 DIAGNOSIS — H353211 Exudative age-related macular degeneration, right eye, with active choroidal neovascularization: Secondary | ICD-10-CM | POA: Diagnosis not present

## 2015-11-23 ENCOUNTER — Other Ambulatory Visit: Payer: Self-pay | Admitting: Internal Medicine

## 2015-11-24 DIAGNOSIS — H353221 Exudative age-related macular degeneration, left eye, with active choroidal neovascularization: Secondary | ICD-10-CM | POA: Diagnosis not present

## 2015-11-24 DIAGNOSIS — H353211 Exudative age-related macular degeneration, right eye, with active choroidal neovascularization: Secondary | ICD-10-CM | POA: Diagnosis not present

## 2015-11-24 DIAGNOSIS — H353134 Nonexudative age-related macular degeneration, bilateral, advanced atrophic with subfoveal involvement: Secondary | ICD-10-CM | POA: Diagnosis not present

## 2015-11-25 ENCOUNTER — Other Ambulatory Visit: Payer: Self-pay | Admitting: Internal Medicine

## 2015-12-02 ENCOUNTER — Other Ambulatory Visit: Payer: Self-pay | Admitting: Internal Medicine

## 2015-12-04 ENCOUNTER — Other Ambulatory Visit: Payer: Medicare Other

## 2015-12-04 DIAGNOSIS — I10 Essential (primary) hypertension: Secondary | ICD-10-CM

## 2015-12-04 DIAGNOSIS — E039 Hypothyroidism, unspecified: Secondary | ICD-10-CM

## 2015-12-05 LAB — BASIC METABOLIC PANEL
BUN/Creatinine Ratio: 13 (ref 12–28)
BUN: 9 mg/dL (ref 8–27)
CALCIUM: 9.3 mg/dL (ref 8.7–10.3)
CHLORIDE: 90 mmol/L — AB (ref 96–106)
CO2: 30 mmol/L — ABNORMAL HIGH (ref 18–29)
Creatinine, Ser: 0.67 mg/dL (ref 0.57–1.00)
GFR calc non Af Amer: 82 mL/min/{1.73_m2} (ref >59)
GFR, EST AFRICAN AMERICAN: 94 mL/min/{1.73_m2} (ref >59)
GLUCOSE: 82 mg/dL (ref 65–99)
Potassium: 3.7 mmol/L (ref 3.5–5.2)
Sodium: 135 mmol/L (ref 134–144)

## 2015-12-05 LAB — TSH: TSH: 11.46 u[IU]/mL — AB (ref 0.450–4.500)

## 2015-12-08 ENCOUNTER — Ambulatory Visit (INDEPENDENT_AMBULATORY_CARE_PROVIDER_SITE_OTHER): Payer: Medicare Other | Admitting: Internal Medicine

## 2015-12-08 ENCOUNTER — Encounter: Payer: Self-pay | Admitting: Internal Medicine

## 2015-12-08 VITALS — BP 112/66 | HR 53 | Temp 97.8°F | Ht 63.0 in | Wt 138.0 lb

## 2015-12-08 DIAGNOSIS — R002 Palpitations: Secondary | ICD-10-CM | POA: Diagnosis not present

## 2015-12-08 DIAGNOSIS — M25562 Pain in left knee: Secondary | ICD-10-CM

## 2015-12-08 DIAGNOSIS — I10 Essential (primary) hypertension: Secondary | ICD-10-CM | POA: Diagnosis not present

## 2015-12-08 DIAGNOSIS — K2100 Gastro-esophageal reflux disease with esophagitis, without bleeding: Secondary | ICD-10-CM

## 2015-12-08 DIAGNOSIS — R609 Edema, unspecified: Secondary | ICD-10-CM | POA: Diagnosis not present

## 2015-12-08 DIAGNOSIS — F411 Generalized anxiety disorder: Secondary | ICD-10-CM

## 2015-12-08 DIAGNOSIS — G609 Hereditary and idiopathic neuropathy, unspecified: Secondary | ICD-10-CM | POA: Diagnosis not present

## 2015-12-08 DIAGNOSIS — M25561 Pain in right knee: Secondary | ICD-10-CM | POA: Diagnosis not present

## 2015-12-08 DIAGNOSIS — E039 Hypothyroidism, unspecified: Secondary | ICD-10-CM

## 2015-12-08 DIAGNOSIS — M6281 Muscle weakness (generalized): Secondary | ICD-10-CM

## 2015-12-08 MED ORDER — LEVOTHYROXINE SODIUM 100 MCG PO TABS: ORAL_TABLET | ORAL | Status: DC

## 2015-12-08 NOTE — Progress Notes (Signed)
Patient ID: [name], female   DOB: 1931-08-14, 82 y.o.   MRN: [id number]    Facility  [facility]    Place of Service:   OFFICE    Allergies  Allergen Reactions  . Ambien [Zolpidem Tartrate]     hallucination  . Bactrim [Sulfamethoxazole-Trimethoprim]   . Morphine And Related Nausea And Vomiting  . Other     z pack     Chief Complaint  Patient presents with  . Medical Management of Chronic Issues    4 month medication management blood pressure, thyroid, anxiety, GERD, review labs     HPI:   Hypothyroidism, unspecified hypothyroidism type - compensated  Essential hypertension - controlled  Bilateral knee pain - tenderness but no effusion  Gastroesophageal reflux disease with esophagitis - improved on current medication  Hereditary and idiopathic peripheral neuropathy - continues with numbness but with little discomfort otherwise in the feet   Quadriceps weakness - patient is exercising by using weights in a purse. She feels that she is building up strength in the legs now.  Edema, unspecified type - 1-2+ edema. Improves when she wears compression stockings.   Anxiety state - improved  Palpitations - improved  Patient in general feels that her conditions have improved since she was last seen.                               Medications: Patient's Medications  New Prescriptions   No medications on file  Previous Medications   ALPRAZOLAM (XANAX) 1 MG TABLET    TAKE 1 TABLET BY MOUTH UP TO 3 TIMES A DAY AS NEEDED FOR ANXIETY OR REST   ATORVASTATIN (LIPITOR) 20 MG TABLET    TAKE 1 TABLET BY MOUTH EVERY DAY   BEVACIZUMAB (AVASTIN) 100 MG/4ML SOLN    Inject monthly in right eye every 6 weeks.   DOXAZOSIN (CARDURA) 4 MG TABLET    TAKE 1 TABLET BY MOUTH DAILY   INDAPAMIDE (LOZOL) 1.25 MG TABLET    TAKE 1 TABLET BY MOUTH DAILY   LEVOTHYROXINE (SYNTHROID, LEVOTHROID) 75 MCG TABLET    TAKE 1 TABLET BY MOUTH EVERY DAY FOR THYROID SUPPLEMENT   LORATADINE (CLARITIN) 10 MG  TABLET    Take 10 mg by mouth daily. Take one tablet once a day as needed for allergies   LOSARTAN (COZAAR) 50 MG TABLET    Take 1 tablet (50 mg total) by mouth daily.   METOPROLOL SUCCINATE (TOPROL-XL) 100 MG 24 HR TABLET    Take 1 tablet by mouth daily with a meal to control blood pressure.   PANTOPRAZOLE (PROTONIX) 40 MG TABLET    Take one tablet by mouth in the morning 30 minutes before breakfast for stomach   POLYETHYLENE GLYCOL (MIRALAX / GLYCOLAX) PACKET    Take 17 g by mouth daily. Mix 17 grams in a 8 oz glass for constipation needed   RANIBIZUMAB (LUCENTIS) 0.3 MG/0.05ML SOLN    Inject into left eye every 5 weeks.   SERTRALINE (ZOLOFT) 50 MG TABLET    Take 1 tablet (50 mg total) by mouth daily. To calm nerves   TEMAZEPAM (RESTORIL) 30 MG CAPSULE    TAKE ONE CAPSULE BY MOUTH AT BEDTIME AS NEEDED FOR SLEEP   ZOSTAVAX 13244 UNT/0.65ML INJECTION    TO BE ADMINISTERED BY PHARMACIST FOR IMMUNIZATION  Modified Medications   No medications on file  Discontinued Medications   ZOSTER VACCINE LIVE, PF, (ZOSTAVAX)  19400 UNT/0.65ML INJECTION    Inject 19,400 Units into the skin once.    Review of Systems  Constitutional: Negative.  Negative for activity change.  Eyes: Positive for visual disturbance (none acute).       Macular degeneration.  Respiratory: Negative for cough, chest tightness, shortness of breath and wheezing.        History of asthma.  Cardiovascular: Negative for chest pain, palpitations and leg swelling.  Gastrointestinal: Positive for constipation.       History of hemorrhoids. Abdominal bloating, gas, and rumbling have improved on pantoprazole.  Endocrine: Positive for polyuria.       History hypothyroidism  Genitourinary: Positive for menstrual problem. Negative for urgency, frequency, hematuria and flank pain.       History kidney stones at age 77.  Musculoskeletal: Negative for myalgias and back pain.  Skin: Negative.   Allergic/Immunologic: Negative.   Neurological:  Negative.  Negative for dizziness, light-headedness and headaches.  Hematological: Negative.   Psychiatric/Behavioral: Positive for sleep disturbance and dysphoric mood.    Filed Vitals:   12/08/15 1435  BP: 112/66  Pulse: 53  Temp: 97.8 F (36.6 C)  TempSrc: Oral  Height: 5' 3"  (1.6 m)  Weight: 138 lb (62.596 kg)  SpO2: 93%   Body mass index is 24.45 kg/(m^2). Filed Weights   12/08/15 1435  Weight: 138 lb (62.596 kg)     Physical Exam  Constitutional: She is oriented to person, place, and time. She appears well-nourished. No distress.  HENT:  Head: Normocephalic and atraumatic.  Right Ear: External ear normal.  Left Ear: External ear normal.  Nose: Nose normal.  Mouth/Throat: Oropharynx is clear and moist.  Partial hearing loss in both ears.  Eyes: Conjunctivae and EOM are normal. Pupils are equal, round, and reactive to light.  Corrective lenses.  Cardiovascular: Normal rate, regular rhythm, normal heart sounds and intact distal pulses.  Exam reveals no gallop and no friction rub.   No murmur heard. Pulmonary/Chest: No respiratory distress. She has no wheezes. She has no rales.  Abdominal: Bowel sounds are normal. She exhibits no distension and no mass. There is no tenderness.  Musculoskeletal: Normal range of motion. She exhibits no edema or tenderness.  Neurological: She is alert and oriented to person, place, and time. She has normal reflexes. No cranial nerve deficit. Coordination normal.  10/28/14 MMSE 29/30. Passed clock drawing. Numbness in the feet has progressed from the toes to about halfway up the foot. She thinks this may impair her gait.  Skin: No rash noted. No erythema. No pallor.  Multiple seborrheic keratoses. Skin tears of both forearms.  Psychiatric: Her behavior is normal. Judgment and thought content normal.  Anxious and possibly depressed.    Labs reviewed: Lab Summary Latest Ref Rng 12/04/2015 07/30/2015 05/01/2015 03/10/2015  Hemoglobin 11.1 -  15.9 g/dL (None) (None) (None) 12.9  Hematocrit 34.0 - 46.6 % (None) (None) (None) 37.4  White count 3.4 - 10.8 x10E3/uL (None) (None) (None) 5.4  Platelet count 150 - 379 x10E3/uL (None) (None) (None) 158  Sodium 134 - 144 mmol/L 135 137 136 129(L)  Potassium 3.5 - 5.2 mmol/L 3.7 3.6 3.8 3.2(L)  Calcium 8.7 - 10.3 mg/dL 9.3 9.0 9.1 9.4  Phosphorus - (None) (None) (None) (None)  Creatinine 0.57 - 1.00 mg/dL 0.67 0.69 0.73 0.60  AST 0 - 40 IU/L (None) 21 (None) 28  Alk Phos 39 - 117 IU/L (None) 106 (None) 78  Bilirubin 0.0 - 1.2 mg/dL (None) 0.5 (None) 0.3  Glucose 65 - 99 mg/dL 82 92 85 130(H)  Cholesterol - (None) (None) (None) (None)  HDL cholesterol >39 mg/dL (None) 59 (None) (None)  Triglycerides 0 - 149 mg/dL (None) 77 (None) (None)  LDL Direct - (None) (None) (None) (None)  LDL Calc 0 - 99 mg/dL (None) 57 (None) (None)  Total protein - (None) (None) (None) (None)  Albumin 3.5 - 4.7 g/dL (None) 3.8 (None) 4.1   Lab Results  Component Value Date   TSH 11.460* 12/04/2015   TSH 8.050* 07/30/2015   TSH 3.840 03/10/2015   T4TOTAL 9.2 04/29/2013   Lab Results  Component Value Date   BUN 9 12/04/2015   BUN 12 07/30/2015   BUN 10 05/01/2015   Lab Results  Component Value Date   HGBA1C  07/23/2007    5.7 (NOTE)   The ADA recommends the following therapeutic goals for glycemic   control related to Hgb A1C measurement:   Goal of Therapy:   < 7.0% Hgb A1C   Action Suggested:  > 8.0% Hgb A1C   Ref:  Diabetes Care, 22, Suppl. 1, 1999    Assessment/Plan  1. Hypothyroidism, unspecified hypothyroidism type - TSH; Future  2. Essential hypertension - Comprehensive metabolic panel; Future  3. Bilateral knee pain Continue with exercises  4. Gastroesophageal reflux disease with esophagitis Continue pantoprazole  5. Hereditary and idiopathic peripheral neuropathy Unchanged  6. Quadriceps weakness Continue quadriceps strengthening exercises  7. Edema, unspecified  type Continue with compression stockings  8. Anxiety state Improved on present medications  9. Palpitations Improved

## 2015-12-21 ENCOUNTER — Other Ambulatory Visit: Payer: Self-pay | Admitting: Internal Medicine

## 2015-12-28 ENCOUNTER — Other Ambulatory Visit: Payer: Self-pay | Admitting: Internal Medicine

## 2015-12-29 DIAGNOSIS — H353221 Exudative age-related macular degeneration, left eye, with active choroidal neovascularization: Secondary | ICD-10-CM | POA: Diagnosis not present

## 2015-12-29 DIAGNOSIS — H353134 Nonexudative age-related macular degeneration, bilateral, advanced atrophic with subfoveal involvement: Secondary | ICD-10-CM | POA: Diagnosis not present

## 2016-01-06 DIAGNOSIS — H353211 Exudative age-related macular degeneration, right eye, with active choroidal neovascularization: Secondary | ICD-10-CM | POA: Diagnosis not present

## 2016-01-21 ENCOUNTER — Other Ambulatory Visit: Payer: Self-pay | Admitting: Internal Medicine

## 2016-01-21 DIAGNOSIS — M47817 Spondylosis without myelopathy or radiculopathy, lumbosacral region: Secondary | ICD-10-CM | POA: Diagnosis not present

## 2016-01-21 DIAGNOSIS — M25561 Pain in right knee: Secondary | ICD-10-CM | POA: Diagnosis not present

## 2016-01-21 DIAGNOSIS — M25562 Pain in left knee: Secondary | ICD-10-CM | POA: Diagnosis not present

## 2016-01-22 ENCOUNTER — Other Ambulatory Visit: Payer: Self-pay | Admitting: Orthopaedic Surgery

## 2016-01-22 DIAGNOSIS — M4316 Spondylolisthesis, lumbar region: Secondary | ICD-10-CM

## 2016-01-28 ENCOUNTER — Other Ambulatory Visit: Payer: Self-pay | Admitting: Internal Medicine

## 2016-01-31 ENCOUNTER — Ambulatory Visit
Admission: RE | Admit: 2016-01-31 | Discharge: 2016-01-31 | Disposition: A | Discharge status: Home / Self Care | Payer: Medicare Other | Source: Ambulatory Visit | Attending: Orthopaedic Surgery | Admitting: Orthopaedic Surgery

## 2016-01-31 DIAGNOSIS — M4316 Spondylolisthesis, lumbar region: Secondary | ICD-10-CM

## 2016-01-31 DIAGNOSIS — M5126 Other intervertebral disc displacement, lumbar region: Secondary | ICD-10-CM | POA: Diagnosis not present

## 2016-02-03 DIAGNOSIS — H353221 Exudative age-related macular degeneration, left eye, with active choroidal neovascularization: Secondary | ICD-10-CM | POA: Diagnosis not present

## 2016-02-05 DIAGNOSIS — M47817 Spondylosis without myelopathy or radiculopathy, lumbosacral region: Secondary | ICD-10-CM | POA: Diagnosis not present

## 2016-02-18 ENCOUNTER — Ambulatory Visit: Payer: Medicare Other | Attending: Orthopaedic Surgery

## 2016-02-18 DIAGNOSIS — M545 Low back pain, unspecified: Secondary | ICD-10-CM

## 2016-02-18 DIAGNOSIS — R262 Difficulty in walking, not elsewhere classified: Secondary | ICD-10-CM | POA: Insufficient documentation

## 2016-02-18 DIAGNOSIS — R293 Abnormal posture: Secondary | ICD-10-CM | POA: Insufficient documentation

## 2016-02-18 DIAGNOSIS — M6281 Muscle weakness (generalized): Secondary | ICD-10-CM | POA: Diagnosis not present

## 2016-02-18 NOTE — Therapy (Signed)
[facility] [address], [address] [place], [place], [address] Phone: [phone]   Fax:  [phone]  Physical Therapy Evaluation  Patient Details  Name: [name] MRN: [id number] Date of Birth: 1931-01-13 Referring Provider: Norlene CampbellWhitfield, Peter, MD  Encounter Date: 02/18/2016      PT End of Session - 02/18/16 1135    Visit Number 1   Number of Visits 10   Date for PT Re-Evaluation 04/14/16   PT Start Time 1058   PT Stop Time 1135   PT Time Calculation (min) 37 min   Activity Tolerance Patient tolerated treatment well   Behavior During Therapy [facility] for tasks assessed/performed      Past Medical History  Diagnosis Date  . Lumbago   . Personal history of fall   . Anxiety state, unspecified   . Unspecified constipation   . Open wound of toe(s), without mention of complication   . Allergic rhinitis due to pollen   . Unspecified tinnitus   . Insomnia, unspecified   . Unspecified hereditary and idiopathic peripheral neuropathy   . Insomnia, unspecified   . Diaphragmatic hernia without mention of obstruction or gangrene   . Sebaceous cyst   . Dizziness and giddiness   . Diffuse cystic mastopathy   . Osteoarthrosis, unspecified whether generalized or localized, unspecified site   . Unspecified hypothyroidism   . Other and unspecified hyperlipidemia   . Unspecified essential hypertension   . Sciatica   . Macular degeneration (senile) of retina, unspecified   . Senile osteoporosis   . Calculus of kidney   . Cataract   . GERD (gastroesophageal reflux disease)   . Hiatal hernia     Past Surgical History  Procedure Laterality Date  . Cholecystectomy  07/25/2007  . Tonsillectomy and adenoidectomy Bilateral 1943  . Cataract extraction, bilateral      There were no vitals filed for this visit.       Subjective Assessment - 02/18/16 1102    Subjective Pt presents to PT with complaints of LE weakness and core  weakness with intermittent LBP that she describes as an ache.  This problem began last summer when she reduced her activity level due to being bit by ticks in her yard.  Pt also reports that she was on Lipitor and feels this migth have been contributing to her LE weakness.  She just stopped taking this due to MD orders.  Pt reports that she has difficulty with her walking and feels off balance.     Pertinent History osteoporosis   Diagnostic tests x-ray: lumbar spondylosis   Patient Stated Goals improve leg strength, improve balance   Currently in Pain? No/denies            [facility] - 02/18/16 0001    Assessment   Medical Diagnosis Lumbar spondylosis   Referring Provider Norlene CampbellWhitfield, Peter, MD   Onset Date/Surgical Date 02/18/15   Next MD Visit 02/2016   Prior Therapy none   Precautions   Precautions Other (comment)  osteoporosis   Restrictions   Weight Bearing Restrictions No   Balance Screen   Has the patient fallen in the past 6 months No   Has the patient had a decrease in activity level because of a fear of falling?  No   Is the patient reluctant to leave their home because of a fear of falling?  No   Home [employer] managernvironment   Living Environment Private residence   Living Arrangements Spouse/significant  other   Type of Home House   Home Access Stairs to enter   Entrance Stairs-Number of Steps 1   Home Layout Able to live on main level with bedroom/bathroom   Prior Function   Level of Independence Independent   Vocation Retired   Leisure read   Cognition   Overall Cognitive Status Within Functional Limits for tasks assessed   Observation/Other Assessments   Focus on Therapeutic Outcomes (FOTO)  37% limitation   Posture/Postural Control   Posture/Postural Control Postural limitations   Postural Limitations Forward head;Rounded Shoulders;Flexed trunk   ROM / Strength   AROM / PROM / Strength AROM;PROM;Strength   AROM   Overall AROM  Within functional limits for  tasks performed   Overall AROM Comments Lumbar and hip AROM are WFLs without pain   PROM   Overall PROM  Within functional limits for tasks performed   Strength   Overall Strength Deficits   Overall Strength Comments Bil hips 4/5, knees 4+/5   Palpation   Palpation comment no palpable tenderness today   Transfers   Transfers Sit to Stand;Stand to Sit   Sit to Stand With upper extremity assist   Ambulation/Gait   Ambulation/Gait Yes   Ambulation/Gait Assistance 7: Independent   Ambulation Distance (Feet) 100 Feet   Gait Pattern Step-through pattern;Trunk flexed;Scissoring;Narrow base of support   Ambulation Surface Level;Indoor   Balance   Balance Assessed Yes   Standardized Balance Assessment   Standardized Balance Assessment Timed Up and Go Test   Timed Up and Go Test   TUG Normal TUG   Normal TUG (seconds) 16   TUG Comments instability with change of directions                           PT Education - 02/18/16 1124    Education provided Yes   Education Details HEP: seated strength, sit to stand, straight leg raise   Person(s) Educated Patient   Methods Explanation;Demonstration;Handout   Comprehension Verbalized understanding;Returned demonstration          PT Short Term Goals - 02/18/16 1140    PT SHORT TERM GOAL #1   Title be independent in initial HEP   Time 4   Period Weeks   Status New   PT SHORT TERM GOAL #2   Title improve LE strength to perform sit to stand with minimal UE assistance    Time 4   Period Weeks   Status New   PT SHORT TERM GOAL #3   Title perform TUG in < or = to 14 seconds with no instability with change of direction   Time 4   Period Weeks   Status New           PT Long Term Goals - 02/18/16 1049    PT LONG TERM GOAL #1   Title be independent in advanced HEP   Time 4   Period Weeks   Status New   PT LONG TERM GOAL #2   Title reduce FOTO to < or = to 31% limitation   Time 4   Period Weeks   Status New    PT LONG TERM GOAL #3   Title demonstrate 4+/5 bilateral hip strength to improve stability with walking long distances   Time 8   Period Weeks   Status New   PT LONG TERM GOAL #4   Title perform TUG in < or = to 12 seconds with no  instability with change of direction   Time 8   Period Weeks   Status New   PT LONG TERM GOAL #5   Title report postural corrections in standing to improve stabiltiy and balance   Time 8   Period Weeks   Status New   Additional Long Term Goals   Additional Long Term Goals Yes   PT LONG TERM GOAL #6   Title report a 30% reduction in LBP with long periods of standing   Time 8   Period Weeks   Status New               Plan - 06-Mar-2016 1135    Clinical Impression Statement Pt presents to PT with complaints of LE weakness and instabiltiy of a chronic nature.  Pt had LE joint aches and MD just requested that she stop taking Lipitor.  Pt would like to improve her LE and core strength due to intermittent low back aching with activity and instability with standing and walking.  FOTO score is 37% limitation, demonstrates LE weakness, moderate UE support with sit to stand and TUG score of 16 seconds with instability with change of direction.  Pt also demonstrates forward head and trunk posture in standing.  Pt will benefit from skilled PT for core and LE strength advancement and balance training to improve safety in the home and community and increased ease with sit to stand transition.     Rehab Potential Good   PT Frequency 2x / week   PT Duration 8 weeks   PT Treatment/Interventions ADLs/Self Care Home Management;Cryotherapy;Electrical Stimulation;Moist Heat;Therapeutic exercise;Therapeutic activities;Stair training;Functional mobility training;Gait training;Ultrasound;Neuromuscular re-education;Patient/family education;Manual techniques;Passive range of motion   PT Next Visit Plan LE strength and core strength, address LBP if needed, postural education    Consulted and Agree with Plan of Care Patient      Patient will benefit from skilled therapeutic intervention in order to improve the following deficits and impairments:  Abnormal gait, Difficulty walking, Pain, Postural dysfunction, Decreased balance, Improper body mechanics, Decreased activity tolerance, Decreased endurance  Visit Diagnosis: Bilateral low back pain without sciatica - Plan: PT plan of care cert/re-cert  Muscle weakness (generalized) - Plan: PT plan of care cert/re-cert  Difficulty in walking, not elsewhere classified - Plan: PT plan of care cert/re-cert  Abnormal posture - Plan: PT plan of care cert/re-cert      G-Codes - March 06, 2016 1048    Functional Assessment Tool Used FOTO: 37% limitation   Functional Limitation Other PT primary   Other PT Primary Current Status (B1478) At least 20 percent but less than 40 percent impaired, limited or restricted   Other PT Primary Goal Status (G9562) At least 20 percent but less than 40 percent impaired, limited or restricted       Problem List Patient Active Problem List   Diagnosis Date Noted  . Bilateral knee pain 12/08/2015  . Abnormality of gait 08/11/2015  . Edema 08/11/2015  . Bunion 08/11/2015  . Quadriceps weakness 08/11/2015  . Anxiety state 03/10/2015  . Palpitations 07-Mar-2015  . GERD (gastroesophageal reflux disease) 11/05/2014  . Hypothyroidism   . Hyperlipidemia   . Essential hypertension   . Macular degeneration (senile) of retina   . Diaphragmatic hernia   . Insomnia   . Hereditary and idiopathic peripheral neuropathy      Lorrene Reid, PT 03-06-2016 11:46 AM  [facility] [address], [address] [place], [place], [address] Phone: [phone]   Fax:  [phone]  Name: [name] MRN: [id number] Date of Birth: 1930-09-06

## 2016-02-18 NOTE — Patient Instructions (Signed)
KNEE: Extension, Long Arc Quad (Weight)  Place weight around leg. Raise leg until knee is straight. Hold _5__ seconds. Use ___ lb weight. _10__ reps per set (each leg), 4-5__ sets per day, __7_ days per week   Knee Raise   Lift knee and then lower it. Repeat with other knee. Repeat _10__ times each leg. Do _4-5___ sessions per day.  http://gt2.exer.us/445   Copyright  VHI. All rights reserved.  Toe Up   Gently rise up on toes and back on heels. Repeat _20___ times. Do 4-5____ sessions per day.  Straight Leg Raise    Tighten top of left thigh. Raise leg off bed.  Tighten your stomach muscles.  Repeat 2x10 on each leg.  2x/day.   HIP / KNEE: Extension - Sit to Stand    Sitting, lean chest forward, raise hips up from surface. Straighten hips and knees. Weight bear equally on left and right sides. Backs of legs should not push off surface.  Perform 2 sets of 10.     Copyright  VHI. All rights reserved.     [facility] [address], [address] [place], [place] [address] Phone # [phone] Fax [phone]

## 2016-02-19 ENCOUNTER — Other Ambulatory Visit: Payer: Self-pay

## 2016-02-19 MED ORDER — ALPRAZOLAM 1 MG PO TABS: ORAL_TABLET | ORAL | Status: DC

## 2016-02-19 NOTE — Telephone Encounter (Signed)
RX called in .

## 2016-02-22 ENCOUNTER — Ambulatory Visit: Payer: Medicare Other | Admitting: Physical Therapy

## 2016-02-22 ENCOUNTER — Encounter: Payer: Self-pay | Admitting: Physical Therapy

## 2016-02-22 DIAGNOSIS — R293 Abnormal posture: Secondary | ICD-10-CM | POA: Diagnosis not present

## 2016-02-22 DIAGNOSIS — R262 Difficulty in walking, not elsewhere classified: Secondary | ICD-10-CM | POA: Diagnosis not present

## 2016-02-22 DIAGNOSIS — M6281 Muscle weakness (generalized): Secondary | ICD-10-CM | POA: Diagnosis not present

## 2016-02-22 DIAGNOSIS — M545 Low back pain, unspecified: Secondary | ICD-10-CM

## 2016-02-22 NOTE — Therapy (Signed)
[facility] [address] [place], [place], [address] Phone: [phone]   Fax:  [phone]  Physical Therapy Treatment  Patient Details  Name: [name] MRN: [id number] Date of Birth: 04/09/1931 Referring Provider: Norlene Campbell, MD  Encounter Date: 02/22/2016      PT End of Session - 02/22/16 1231    Visit Number 2   Number of Visits 10   Date for PT Re-Evaluation 04/14/16   PT Start Time 1229   PT Stop Time 1310   PT Time Calculation (min) 41 min   Activity Tolerance Patient tolerated treatment well   Behavior During Therapy [facility] for tasks assessed/performed      Past Medical History  Diagnosis Date  . Lumbago   . Personal history of fall   . Anxiety state, unspecified   . Unspecified constipation   . Open wound of toe(s), without mention of complication   . Allergic rhinitis due to pollen   . Unspecified tinnitus   . Insomnia, unspecified   . Unspecified hereditary and idiopathic peripheral neuropathy   . Insomnia, unspecified   . Diaphragmatic hernia without mention of obstruction or gangrene   . Sebaceous cyst   . Dizziness and giddiness   . Diffuse cystic mastopathy   . Osteoarthrosis, unspecified whether generalized or localized, unspecified site   . Unspecified hypothyroidism   . Other and unspecified hyperlipidemia   . Unspecified essential hypertension   . Sciatica   . Macular degeneration (senile) of retina, unspecified   . Senile osteoporosis   . Calculus of kidney   . Cataract   . GERD (gastroesophageal reflux disease)   . Hiatal hernia     Past Surgical History  Procedure Laterality Date  . Cholecystectomy  07/25/2007  . Tonsillectomy and adenoidectomy Bilateral 1943  . Cataract extraction, bilateral      There were no vitals filed for this visit.      Subjective Assessment - 02/22/16 1230    Subjective I'm doing ok today. Doing her HEP.    Currently in Pain?  No/denies   Multiple Pain Sites No                         [facility] Adult PT Treatment/Exercise - 02/22/16 0001    Lumbar Exercises: Aerobic   Stationary Bike Nustep l1 x 10 min   Knee/Hip Exercises: Standing   Hip Abduction Stengthening;Left;2 sets;10 reps;Knee straight  2#   Hip Extension Stengthening;Both;2 sets;10 reps;Knee straight  2#   Wall Squat --  Kitchen sink squats 10x with UE holding on   Knee/Hip Exercises: Seated   Ball Squeeze 20x   Sit to [employer] 10 reps;without UE support                PT Education - 02/22/16 1300    Education provided Yes   Education Details Donut shaped chair cushion for sitting on hard or too soft surfaces.    Person(s) Educated Patient   Methods Explanation;Demonstration   Comprehension Verbalized understanding;Returned demonstration          PT Short Term Goals - 02/22/16 1300    PT SHORT TERM GOAL #1   Title be independent in initial HEP   Time 4   Period Weeks   Status Achieved   PT SHORT TERM GOAL #2   Title improve LE strength to perform sit to stand with minimal UE assistance    Time 4  Period Weeks   Status Achieved           PT Long Term Goals - 02/18/16 1049    PT LONG TERM GOAL #1   Title be independent in advanced HEP   Time 4   Period Weeks   Status New   PT LONG TERM GOAL #2   Title reduce FOTO to < or = to 31% limitation   Time 4   Period Weeks   Status New   PT LONG TERM GOAL #3   Title demonstrate 4+/5 bilateral hip strength to improve stability with walking long distances   Time 8   Period Weeks   Status New   PT LONG TERM GOAL #4   Title perform TUG in < or = to 12 seconds with no instability with change of direction   Time 8   Period Weeks   Status New   PT LONG TERM GOAL #5   Title report postural corrections in standing to improve stabiltiy and balance   Time 8   Period Weeks   Status New   Additional Long Term Goals   Additional Long Term Goals Yes   PT LONG  TERM GOAL #6   Title report a 30% reduction in LBP with long periods of standing   Time 8   Period Weeks   Status New               Plan - 02/22/16 1231    Clinical Impression Statement pt independent in HEP and compliant. Her walking, per her reports, feels stronger and steadier. This is only her first visit. She performs standing abduction with compensation. After iinstruction this improved some, but is still difficult to find the correct hip muscle. She will work on this at home.    Rehab Potential Good   PT Frequency 2x / week   PT Duration 8 weeks   PT Treatment/Interventions ADLs/Self Care Home Management;Cryotherapy;Electrical Stimulation;Moist Heat;Therapeutic exercise;Therapeutic activities;Stair training;Functional mobility training;Gait training;Ultrasound;Neuromuscular re-education;Patient/family education;Manual techniques;Passive range of motion   PT Next Visit Plan Hip strength in standing, add Level 1 core exs to HEP.    Consulted and Agree with Plan of Care Patient      Patient will benefit from skilled therapeutic intervention in order to improve the following deficits and impairments:  Abnormal gait, Difficulty walking, Pain, Postural dysfunction, Decreased balance, Improper body mechanics, Decreased activity tolerance, Decreased endurance  Visit Diagnosis: Bilateral low back pain without sciatica  Muscle weakness (generalized)  Difficulty in walking, not elsewhere classified  Abnormal posture     Problem List Patient Active Problem List   Diagnosis Date Noted  . Bilateral knee pain 12/08/2015  . Abnormality of gait 08/11/2015  . Edema 08/11/2015  . Bunion 08/11/2015  . Quadriceps weakness 08/11/2015  . Anxiety state 03/10/2015  . Palpitations 02/18/2015  . GERD (gastroesophageal reflux disease) 11/05/2014  . Hypothyroidism   . Hyperlipidemia   . Essential hypertension   . Macular degeneration (senile) of retina   . Diaphragmatic hernia   .  Insomnia   . Hereditary and idiopathic peripheral neuropathy     COCHRAN,JENNIFER, PTA 02/22/2016, 1:11 PM  [facility] [address], [address] [place], [place], [address] Phone: [phone]   Fax:  [phone]  Name: [name] MRN: [id number] Date of Birth: 1931-07-16

## 2016-02-25 ENCOUNTER — Ambulatory Visit: Payer: Medicare Other | Admitting: Physical Therapy

## 2016-02-25 DIAGNOSIS — M545 Low back pain, unspecified: Secondary | ICD-10-CM

## 2016-02-25 DIAGNOSIS — M6281 Muscle weakness (generalized): Secondary | ICD-10-CM

## 2016-02-25 DIAGNOSIS — R293 Abnormal posture: Secondary | ICD-10-CM

## 2016-02-25 DIAGNOSIS — R262 Difficulty in walking, not elsewhere classified: Secondary | ICD-10-CM

## 2016-02-25 NOTE — Therapy (Signed)
[facility] [address], [address] [place], [place], [address] Phone: [phone]   Fax:  [phone]  Physical Therapy Treatment  Patient Details  Name: [name] MRN: [id number] Date of Birth: 10/14/1930 Referring Provider: Norlene CampbellWhitfield, Peter, MD  Encounter Date: 02/25/2016      PT End of Session - 02/25/16 1050    Visit Number 3   Number of Visits 10   Date for PT Re-Evaluation 04/14/16   PT Start Time 1015   PT Stop Time 1053   PT Time Calculation (min) 38 min   Activity Tolerance Patient tolerated treatment well   Behavior During Therapy [facility] for tasks assessed/performed      Past Medical History  Diagnosis Date  . Lumbago   . Personal history of fall   . Anxiety state, unspecified   . Unspecified constipation   . Open wound of toe(s), without mention of complication   . Allergic rhinitis due to pollen   . Unspecified tinnitus   . Insomnia, unspecified   . Unspecified hereditary and idiopathic peripheral neuropathy   . Insomnia, unspecified   . Diaphragmatic hernia without mention of obstruction or gangrene   . Sebaceous cyst   . Dizziness and giddiness   . Diffuse cystic mastopathy   . Osteoarthrosis, unspecified whether generalized or localized, unspecified site   . Unspecified hypothyroidism   . Other and unspecified hyperlipidemia   . Unspecified essential hypertension   . Sciatica   . Macular degeneration (senile) of retina, unspecified   . Senile osteoporosis   . Calculus of kidney   . Cataract   . GERD (gastroesophageal reflux disease)   . Hiatal hernia     Past Surgical History  Procedure Laterality Date  . Cholecystectomy  07/25/2007  . Tonsillectomy and adenoidectomy Bilateral 1943  . Cataract extraction, bilateral      There were no vitals filed for this visit.      Subjective Assessment - 02/25/16 1020    Subjective No problems with the HEP, just bothered by the neuropathy   Patient Stated Goals improve leg strength, improve balance   Currently in Pain? No/denies                         [facility] Treatment/Exercise - 02/25/16 0001    Lumbar Exercises: Aerobic   Stationary Bike nustep level 1 x 8 minutes   Lumbar Exercises: Supine   Ab Set 10 reps;3 seconds  tactile cues   AB Set Limitations ab set with marching with tactile cues 2 x 10   Other Supine Lumbar Exercises adduction squeeze 20 x 3 second hold   Knee/Hip Exercises: Standing   Hip Abduction Stengthening;Both;2 sets;10 reps  2#   Hip Extension Stengthening;Both;2 sets;10 reps  2#   Wall Squat --  kitchen sink squats 2 x 10 with UE support   Knee/Hip Exercises: Seated   Sit to Sand 10 reps;without UE support                PT Education - 02/25/16 1043    Education provided Yes   Education Details ab set   Starwood HotelsPerson(s) Educated Patient   Methods Explanation;Demonstration   Comprehension Returned demonstration;Verbalized understanding          PT Short Term Goals - 02/25/16 1050    PT SHORT TERM GOAL #1   Title be independent in initial HEP   Status Achieved   PT SHORT TERM  GOAL #2   Title improve LE strength to perform sit to stand with minimal UE assistance    Status Achieved   PT SHORT TERM GOAL #3   Title perform TUG in < or = to 14 seconds with no instability with change of direction   Status On-going           PT Long Term Goals - 02/25/16 1051    PT LONG TERM GOAL #1   Title be independent in advanced HEP   Status On-going   PT LONG TERM GOAL #2   Title reduce FOTO to < or = to 31% limitation   Status On-going   PT LONG TERM GOAL #3   Title demonstrate 4+/5 bilateral hip strength to improve stability with walking long distances   Status On-going   PT LONG TERM GOAL #4   Title perform TUG in < or = to 12 seconds with no instability with change of direction   PT LONG TERM GOAL #5   Status On-going   PT LONG TERM GOAL #6   Title report  a 30% reduction in LBP with long periods of standing   Status On-going               Plan - 02/25/16 1041    Clinical Impression Statement pt with difficulty performing ab set, unable to hold > 3 seconds, requires tactile cues, fatigues quickly. pt improving LE strength, able to tolerate session with limited rest breaks   Rehab Potential Good   PT Frequency 2x / week   PT Duration 8 weeks   PT Treatment/Interventions ADLs/Self Care Home Management;Cryotherapy;Electrical Stimulation;Moist Heat;Therapeutic exercise;Therapeutic activities;Stair training;Functional mobility training;Gait training;Ultrasound;Neuromuscular re-education;Patient/family education;Manual techniques;Passive range of motion   PT Next Visit Plan progress core strength   Consulted and Agree with Plan of Care Patient      Patient will benefit from skilled therapeutic intervention in order to improve the following deficits and impairments:  Abnormal gait, Difficulty walking, Pain, Postural dysfunction, Decreased balance, Improper body mechanics, Decreased activity tolerance, Decreased endurance  Visit Diagnosis: Bilateral low back pain without sciatica  Muscle weakness (generalized)  Difficulty in walking, not elsewhere classified  Abnormal posture     Problem List Patient Active Problem List   Diagnosis Date Noted  . Bilateral knee pain 12/08/2015  . Abnormality of gait 08/11/2015  . Edema 08/11/2015  . Bunion 08/11/2015  . Quadriceps weakness 08/11/2015  . Anxiety state 03/10/2015  . Palpitations 02/18/2015  . GERD (gastroesophageal reflux disease) 11/05/2014  . Hypothyroidism   . Hyperlipidemia   . Essential hypertension   . Macular degeneration (senile) of retina   . Diaphragmatic hernia   . Insomnia   . Hereditary and idiopathic peripheral neuropathy     Reggy Eye, PT, DPT  02/25/2016, 10:52 AM  [facility] [address],  [address] [place], [place], [address] Phone: [phone]   Fax:  [phone]  Name: [name] MRN: [id number] Date of Birth: 1931-03-06

## 2016-02-29 ENCOUNTER — Ambulatory Visit: Payer: Medicare Other | Admitting: Physical Therapy

## 2016-02-29 ENCOUNTER — Other Ambulatory Visit: Payer: Self-pay

## 2016-02-29 DIAGNOSIS — R262 Difficulty in walking, not elsewhere classified: Secondary | ICD-10-CM

## 2016-02-29 DIAGNOSIS — R293 Abnormal posture: Secondary | ICD-10-CM | POA: Diagnosis not present

## 2016-02-29 DIAGNOSIS — M545 Low back pain, unspecified: Secondary | ICD-10-CM

## 2016-02-29 DIAGNOSIS — M6281 Muscle weakness (generalized): Secondary | ICD-10-CM

## 2016-02-29 MED ORDER — TEMAZEPAM 30 MG PO CAPS: 30.0000 mg | ORAL_CAPSULE | Freq: Every evening | ORAL | Status: DC | PRN

## 2016-02-29 NOTE — Therapy (Signed)
[facility] [address], [address] [place], [place], [address] Phone: [phone]   Fax:  [phone]  Physical Therapy Treatment  Patient Details  Name: Freddrick Marchorma P Lotts MRN: [id number] Date of Birth: [birth year] Referring Provider: Norlene CampbellWhitfield, Peter, MD  Encounter Date: 02/29/2016      PT End of Session - 02/29/16 1001    Visit Number 4   Number of Visits 10   Date for PT Re-Evaluation 04/14/16   PT Start Time 0930   PT Stop Time 1010   PT Time Calculation (min) 40 min   Activity Tolerance Patient tolerated treatment well   Behavior During Therapy [facility] for tasks assessed/performed      Past Medical History  Diagnosis Date  . Lumbago   . Personal history of fall   . Anxiety state, unspecified   . Unspecified constipation   . Open wound of toe(s), without mention of complication   . Allergic rhinitis due to pollen   . Unspecified tinnitus   . Insomnia, unspecified   . Unspecified hereditary and idiopathic peripheral neuropathy   . Insomnia, unspecified   . Diaphragmatic hernia without mention of obstruction or gangrene   . Sebaceous cyst   . Dizziness and giddiness   . Diffuse cystic mastopathy   . Osteoarthrosis, unspecified whether generalized or localized, unspecified site   . Unspecified hypothyroidism   . Other and unspecified hyperlipidemia   . Unspecified essential hypertension   . Sciatica   . Macular degeneration (senile) of retina, unspecified   . Senile osteoporosis   . Calculus of kidney   . Cataract   . GERD (gastroesophageal reflux disease)   . Hiatal hernia     Past Surgical History  Procedure Laterality Date  . Cholecystectomy  07/25/2007  . Tonsillectomy and adenoidectomy Bilateral 1943  . Cataract extraction, bilateral      There were no vitals filed for this visit.      Subjective Assessment - 02/29/16 0933    Subjective I feel like my legs are getting better, my knees don't hurt as  much   Pertinent History osteoporosis   Diagnostic tests x-ray: lumbar spondylosis   Patient Stated Goals improve leg strength, improve balance   Currently in Pain? No/denies            [facility] - 02/29/16 0001    Timed Up and Go Test   TUG Normal TUG   Normal TUG (seconds) 13.3                     [facility] Adult PT Treatment/Exercise - 02/29/16 0001    Lumbar Exercises: Aerobic   Stationary Bike nustep level 2 x 8 minutes   Lumbar Exercises: Supine   Ab Set 10 reps;3 seconds  very difficult! needs tactile cues   AB Set Limitations ab set with march 2 x 10   Bridge 20 reps   Other Supine Lumbar Exercises adduction squeeze 20 x 3 second hold   Knee/Hip Exercises: Standing   Hip Abduction Stengthening;Both;2 sets;10 reps  2#   Hip Extension Stengthening;Both;2 sets;10 reps  2#   Wall Squat --  kitchen sink squats 2 x 10 with UE support   Knee/Hip Exercises: Seated   Sit to Sand 10 reps;without UE support                PT Education - 02/29/16 1001    Education provided Yes   Education Details bridging   Person(s)  Educated Patient   Methods Explanation;Demonstration   Comprehension Verbalized understanding;Returned demonstration          PT Short Term Goals - 02/29/16 0935    PT SHORT TERM GOAL #1   Title be independent in initial HEP   Status Achieved   PT SHORT TERM GOAL #2   Title improve LE strength to perform sit to stand with minimal UE assistance    Status Achieved   PT SHORT TERM GOAL #3   Title perform TUG in < or = to 14 seconds with no instability with change of direction   Status Achieved  13.3 seconds           PT Long Term Goals - 02/29/16 1003    PT LONG TERM GOAL #1   Title be independent in advanced HEP   Status On-going   PT LONG TERM GOAL #2   Title reduce FOTO to < or = to 31% limitation   Status On-going   PT LONG TERM GOAL #3   Title demonstrate 4+/5 bilateral hip strength to improve stability  with walking long distances   Status On-going   PT LONG TERM GOAL #4   Title perform TUG in < or = to 12 seconds with no instability with change of direction   Status On-going   PT LONG TERM GOAL #5   Title report postural corrections in standing to improve stabiltiy and balance   Status On-going   PT LONG TERM GOAL #6   Title report a 30% reduction in LBP with long periods of standing   Status On-going               Plan - 02/29/16 1002    Clinical Impression Statement pt continues with difficulty with ab set, core weakness noted during add squeeze and bridging, pt states she is feeling stronger in her legs after exercising   Rehab Potential Good   PT Frequency 2x / week   PT Duration 8 weeks   PT Treatment/Interventions ADLs/Self Care Home Management;Cryotherapy;Electrical Stimulation;Moist Heat;Therapeutic exercise;Therapeutic activities;Stair training;Functional mobility training;Gait training;Ultrasound;Neuromuscular re-education;Patient/family education;Manual techniques;Passive range of motion   PT Next Visit Plan continue core strength, begin balance training   Consulted and Agree with Plan of Care Patient      Patient will benefit from skilled therapeutic intervention in order to improve the following deficits and impairments:  Abnormal gait, Difficulty walking, Pain, Postural dysfunction, Decreased balance, Improper body mechanics, Decreased activity tolerance, Decreased endurance  Visit Diagnosis: Bilateral low back pain without sciatica  Muscle weakness (generalized)  Difficulty in walking, not elsewhere classified  Abnormal posture     Problem List Patient Active Problem List   Diagnosis Date Noted  . Bilateral knee pain 12/08/2015  . Abnormality of gait 08/11/2015  . Edema 08/11/2015  . Bunion 08/11/2015  . Quadriceps weakness 08/11/2015  . Anxiety state 03/10/2015  . Palpitations 02/18/2015  . GERD (gastroesophageal reflux disease) 11/05/2014  .  Hypothyroidism   . Hyperlipidemia   . Essential hypertension   . Macular degeneration (senile) of retina   . Diaphragmatic hernia   . Insomnia   . Hereditary and idiopathic peripheral neuropathy     Reggy Eye, PT, DPT  02/29/2016, 10:05 AM  [facility] [address], [address] [place], [place], [address] Phone: [phone]   Fax:  [phone]  Name: BERNEDA PICCININNI MRN: [id number] Date of Birth: [birth year]

## 2016-02-29 NOTE — Telephone Encounter (Signed)
Refill request was received from [id number] for temazepam 30 mg capsules. Take one capsule by mouth at bedtime as needed for sleep.   Rx was printed and placed in Dr. Ernest Mallickeed's folder for signing. Rx should be faxed to [phone].

## 2016-03-03 ENCOUNTER — Ambulatory Visit: Payer: Medicare Other | Admitting: Physical Therapy

## 2016-03-03 DIAGNOSIS — R262 Difficulty in walking, not elsewhere classified: Secondary | ICD-10-CM

## 2016-03-03 DIAGNOSIS — M6281 Muscle weakness (generalized): Secondary | ICD-10-CM | POA: Diagnosis not present

## 2016-03-03 DIAGNOSIS — M545 Low back pain, unspecified: Secondary | ICD-10-CM

## 2016-03-03 DIAGNOSIS — R293 Abnormal posture: Secondary | ICD-10-CM

## 2016-03-03 NOTE — Therapy (Signed)
[facility] [address] [place], [place], [address] Phone: [phone]   Fax:  [phone]  Physical Therapy Treatment  Patient Details  Name: [name] MRN: [id number] Date of Birth: 1930-10-28 Referring Provider: Norlene Campbell, MD  Encounter Date: 03/03/2016      PT End of Session - 03/03/16 1010    Visit Number 5   Number of Visits 10   Date for PT Re-Evaluation 04/14/16   PT Start Time 0930   PT Stop Time 1013   PT Time Calculation (min) 43 min   Activity Tolerance Patient tolerated treatment well   Behavior During Therapy [facility] for tasks assessed/performed      Past Medical History  Diagnosis Date  . Lumbago   . Personal history of fall   . Anxiety state, unspecified   . Unspecified constipation   . Open wound of toe(s), without mention of complication   . Allergic rhinitis due to pollen   . Unspecified tinnitus   . Insomnia, unspecified   . Unspecified hereditary and idiopathic peripheral neuropathy   . Insomnia, unspecified   . Diaphragmatic hernia without mention of obstruction or gangrene   . Sebaceous cyst   . Dizziness and giddiness   . Diffuse cystic mastopathy   . Osteoarthrosis, unspecified whether generalized or localized, unspecified site   . Unspecified hypothyroidism   . Other and unspecified hyperlipidemia   . Unspecified essential hypertension   . Sciatica   . Macular degeneration (senile) of retina, unspecified   . Senile osteoporosis   . Calculus of kidney   . Cataract   . GERD (gastroesophageal reflux disease)   . Hiatal hernia     Past Surgical History  Procedure Laterality Date  . Cholecystectomy  07/25/2007  . Tonsillectomy and adenoidectomy Bilateral 1943  . Cataract extraction, bilateral      There were no vitals filed for this visit.      Subjective Assessment - 03/03/16 0932    Subjective I feel like I don't have a lot of energy, but I'm only have pain  in my feet due to neuropathy, my knees are much better   Diagnostic tests x-ray: lumbar spondylosis   Patient Stated Goals improve leg strength, improve balance   Currently in Pain? No/denies                         [facility] Adult PT Treatment/Exercise - 03/03/16 0001    Lumbar Exercises: Aerobic   Stationary Bike nustep level 2 x 8 minutes   Lumbar Exercises: Supine   Ab Set 10 reps;3 seconds  tactile cues   Bridge 20 reps   Bridge Limitations bridge withh add squeeze x 10   Other Supine Lumbar Exercises adduction squeeze 2 x 10   Knee/Hip Exercises: Standing   Hip Abduction Stengthening;Both;2 sets;10 reps  2#   Hip Extension Stengthening;Both;2 sets;10 reps  2#   Wall Squat --  kitchen sink squats 2 x 10   Other Standing Knee Exercises heel raises 2 x 10                  PT Short Term Goals - 03/03/16 1011    PT SHORT TERM GOAL #1   Title be independent in initial HEP   Status Achieved   PT SHORT TERM GOAL #2   Title improve LE strength to perform sit to stand with minimal UE assistance    Status Achieved  PT SHORT TERM GOAL #3   Title perform TUG in < or = to 14 seconds with no instability with change of direction   Status Achieved           PT Long Term Goals - 03/03/16 1011    PT LONG TERM GOAL #1   Title be independent in advanced HEP   Status On-going   PT LONG TERM GOAL #2   Title reduce FOTO to < or = to 31% limitation   Status On-going   PT LONG TERM GOAL #3   Title demonstrate 4+/5 bilateral hip strength to improve stability with walking long distances   Status On-going   PT LONG TERM GOAL #4   Title perform TUG in < or = to 12 seconds with no instability with change of direction   Status On-going   PT LONG TERM GOAL #5   Title report postural corrections in standing to improve stabiltiy and balance   Status On-going   PT LONG TERM GOAL #6   Title report a 30% reduction in LBP with long periods of standing   Status  On-going               Plan - 03/03/16 1010    Clinical Impression Statement pt with improved control of ab sets today, able to add bridge with adductor squeeze, continues to be mainly limited by neuropathy in feet   Rehab Potential Good   PT Frequency 2x / week   PT Duration 8 weeks   PT Treatment/Interventions ADLs/Self Care Home Management;Cryotherapy;Electrical Stimulation;Moist Heat;Therapeutic exercise;Therapeutic activities;Stair training;Functional mobility training;Gait training;Ultrasound;Neuromuscular re-education;Patient/family education;Manual techniques;Passive range of motion   PT Next Visit Plan continue core strength, begin balance training   Consulted and Agree with Plan of Care Patient      Patient will benefit from skilled therapeutic intervention in order to improve the following deficits and impairments:  Abnormal gait, Difficulty walking, Pain, Postural dysfunction, Decreased balance, Improper body mechanics, Decreased activity tolerance, Decreased endurance  Visit Diagnosis: Bilateral low back pain without sciatica  Muscle weakness (generalized)  Difficulty in walking, not elsewhere classified  Abnormal posture     Problem List Patient Active Problem List   Diagnosis Date Noted  . Bilateral knee pain 12/08/2015  . Abnormality of gait 08/11/2015  . Edema 08/11/2015  . Bunion 08/11/2015  . Quadriceps weakness 08/11/2015  . Anxiety state 03/10/2015  . Palpitations 02/18/2015  . GERD (gastroesophageal reflux disease) 11/05/2014  . Hypothyroidism   . Hyperlipidemia   . Essential hypertension   . Macular degeneration (senile) of retina   . Diaphragmatic hernia   . Insomnia   . Hereditary and idiopathic peripheral neuropathy     Reggy EyeKaren Donawerth, PT, DPT  03/03/2016, 10:14 AM  [facility] [address], [address] [place], [place], [address] Phone: [phone]   Fax:  [phone]  Name:  [name] MRN: [id number] Date of Birth: [birth year]

## 2016-03-07 DIAGNOSIS — M47817 Spondylosis without myelopathy or radiculopathy, lumbosacral region: Secondary | ICD-10-CM | POA: Diagnosis not present

## 2016-03-09 DIAGNOSIS — H353221 Exudative age-related macular degeneration, left eye, with active choroidal neovascularization: Secondary | ICD-10-CM | POA: Diagnosis not present

## 2016-03-10 ENCOUNTER — Encounter: Payer: Self-pay | Admitting: Physical Therapy

## 2016-03-10 ENCOUNTER — Ambulatory Visit: Payer: Medicare Other | Admitting: Physical Therapy

## 2016-03-10 DIAGNOSIS — R293 Abnormal posture: Secondary | ICD-10-CM | POA: Diagnosis not present

## 2016-03-10 DIAGNOSIS — M545 Low back pain, unspecified: Secondary | ICD-10-CM

## 2016-03-10 DIAGNOSIS — R262 Difficulty in walking, not elsewhere classified: Secondary | ICD-10-CM | POA: Diagnosis not present

## 2016-03-10 DIAGNOSIS — M6281 Muscle weakness (generalized): Secondary | ICD-10-CM | POA: Diagnosis not present

## 2016-03-10 NOTE — Therapy (Signed)
[facility] [address] [place], [place], [address] Phone: [phone]   Fax:  [phone]  Physical Therapy Treatment  Patient Details  Name: [name] MRN: [id number] Date of Birth: Nov 02, 1930 Referring Provider: Norlene Campbell, MD  Encounter Date: 03/10/2016      PT End of Session - 03/10/16 1007    Visit Number 6   Number of Visits 10   Date for PT Re-Evaluation 04/14/16   PT Start Time 0940   PT Stop Time 1008   PT Time Calculation (min) 28 min   Activity Tolerance Patient tolerated treatment well   Behavior During Therapy [facility] for tasks assessed/performed      Past Medical History:  Diagnosis Date  . Allergic rhinitis due to pollen   . Anxiety state, unspecified   . Calculus of kidney   . Cataract   . Diaphragmatic hernia without mention of obstruction or gangrene   . Diffuse cystic mastopathy   . Dizziness and giddiness   . GERD (gastroesophageal reflux disease)   . Hiatal hernia   . Insomnia, unspecified   . Insomnia, unspecified   . Lumbago   . Macular degeneration (senile) of retina, unspecified   . Open wound of toe(s), without mention of complication   . Osteoarthrosis, unspecified whether generalized or localized, unspecified site   . Other and unspecified hyperlipidemia   . Personal history of fall   . Sciatica   . Sebaceous cyst   . Senile osteoporosis   . Unspecified constipation   . Unspecified essential hypertension   . Unspecified hereditary and idiopathic peripheral neuropathy   . Unspecified hypothyroidism   . Unspecified tinnitus     Past Surgical History:  Procedure Laterality Date  . CATARACT EXTRACTION, BILATERAL    . CHOLECYSTECTOMY  07/25/2007  . TONSILLECTOMY AND ADENOIDECTOMY Bilateral 1943    There were no vitals filed for this visit.      Subjective Assessment - 03/10/16 0942    Subjective I have a little food poisoning from dinner last night, but I  will do what I can. Pt saw the MD who states she is doing well, recommended seeing neurologist for neuropathy   Pertinent History osteoporosis   Diagnostic tests x-ray: lumbar spondylosis   Patient Stated Goals improve leg strength, improve balance   Currently in Pain? No/denies                         [facility] Adult PT Treatment/Exercise - 03/10/16 0001      High Level Balance   High Level Balance Activities Side stepping;Backward walking;Tandem walking   High Level Balance Comments min A with tandem walk and backward walk for balance     Lumbar Exercises: Aerobic   Stationary Bike nustep level 2 x 6 minutes     Knee/Hip Exercises: Standing   Hip Abduction Stengthening;Both;2 sets;10 reps  2#   Hip Extension Stengthening;Both;2 sets;10 reps  2#   Wall Squat --  kitchen sink squat 2 x 10   Other Standing Knee Exercises heel raises 2 x 10                  PT Short Term Goals - 03/10/16 1009      PT SHORT TERM GOAL #1   Title be independent in initial HEP   Status Achieved     PT SHORT TERM GOAL #2   Title improve LE strength to perform sit to  stand with minimal UE assistance    Status Achieved     PT SHORT TERM GOAL #3   Title perform TUG in < or = to 14 seconds with no instability with change of direction   Status Achieved           PT Long Term Goals - 03/10/16 1009      PT LONG TERM GOAL #1   Title be independent in advanced HEP   Status On-going     PT LONG TERM GOAL #2   Title reduce FOTO to < or = to 31% limitation   Status On-going     PT LONG TERM GOAL #3   Title demonstrate 4+/5 bilateral hip strength to improve stability with walking long distances   Status On-going     PT LONG TERM GOAL #4   Title perform TUG in < or = to 12 seconds with no instability with change of direction   Status On-going     PT LONG TERM GOAL #5   Title report postural corrections in standing to improve stabiltiy and balance   Status On-going      PT LONG TERM GOAL #6   Title report a 30% reduction in LBP with long periods of standing   Status On-going               Plan - 03/10/16 1008    Clinical Impression Statement Pt treatment limited by feeling "weak" due to upset stomach. Pt continues with balance difficulty, will continue to benefit from continued balance and strength training   Rehab Potential Good   PT Frequency 2x / week   PT Duration 8 weeks   PT Treatment/Interventions ADLs/Self Care Home Management;Cryotherapy;Electrical Stimulation;Moist Heat;Therapeutic exercise;Therapeutic activities;Stair training;Functional mobility training;Gait training;Ultrasound;Neuromuscular re-education;Patient/family education;Manual techniques;Passive range of motion   PT Next Visit Plan resume core strength, continue balance training   Consulted and Agree with Plan of Care Patient      Patient will benefit from skilled therapeutic intervention in order to improve the following deficits and impairments:  Abnormal gait, Difficulty walking, Pain, Postural dysfunction, Decreased balance, Improper body mechanics, Decreased activity tolerance, Decreased endurance  Visit Diagnosis: Bilateral low back pain without sciatica  Muscle weakness (generalized)  Difficulty in walking, not elsewhere classified  Abnormal posture     Problem List Patient Active Problem List   Diagnosis Date Noted  . Bilateral knee pain 12/08/2015  . Abnormality of gait 08/11/2015  . Edema 08/11/2015  . Bunion 08/11/2015  . Quadriceps weakness 08/11/2015  . Anxiety state 03/10/2015  . Palpitations 02/18/2015  . GERD (gastroesophageal reflux disease) 11/05/2014  . Hypothyroidism   . Hyperlipidemia   . Essential hypertension   . Macular degeneration (senile) of retina   . Diaphragmatic hernia   . Insomnia   . Hereditary and idiopathic peripheral neuropathy     Reggy Eye, PT, DPT 03/10/2016, 10:11 AM  [facility] [address] [place], [place], [address] Phone: [phone]   Fax:  [phone]  Name: [name] MRN: [id number] Date of Birth: [birth year]

## 2016-03-14 ENCOUNTER — Ambulatory Visit: Payer: Medicare Other | Admitting: Physical Therapy

## 2016-03-14 ENCOUNTER — Other Ambulatory Visit: Payer: Self-pay | Admitting: Internal Medicine

## 2016-03-14 ENCOUNTER — Encounter: Payer: Self-pay | Admitting: Physical Therapy

## 2016-03-14 DIAGNOSIS — M545 Low back pain, unspecified: Secondary | ICD-10-CM

## 2016-03-14 DIAGNOSIS — M6281 Muscle weakness (generalized): Secondary | ICD-10-CM | POA: Diagnosis not present

## 2016-03-14 DIAGNOSIS — R293 Abnormal posture: Secondary | ICD-10-CM | POA: Diagnosis not present

## 2016-03-14 DIAGNOSIS — R262 Difficulty in walking, not elsewhere classified: Secondary | ICD-10-CM

## 2016-03-14 NOTE — Therapy (Signed)
[facility] [address], [place], [place], [address] Phone: [phone]   Fax:  [phone]  Physical Therapy Treatment  Patient Details  Name: [name] MRN: [id number] Date of Birth: 1930/09/22 Referring Provider: Joni Fears, MD  Encounter Date: 03/14/2016      PT End of Session - 03/14/16 1007    Visit Number 7   Number of Visits 10   Date for PT Re-Evaluation 04/14/16   PT Start Time 0930   PT Stop Time 1008   PT Time Calculation (min) 38 min   Activity Tolerance Patient tolerated treatment well   Behavior During Therapy [facility] for tasks assessed/performed      Past Medical History:  Diagnosis Date  . Allergic rhinitis due to pollen   . Anxiety state, unspecified   . Calculus of kidney   . Cataract   . Diaphragmatic hernia without mention of obstruction or gangrene   . Diffuse cystic mastopathy   . Dizziness and giddiness   . GERD (gastroesophageal reflux disease)   . Hiatal hernia   . Insomnia, unspecified   . Insomnia, unspecified   . Lumbago   . Macular degeneration (senile) of retina, unspecified   . Open wound of toe(s), without mention of complication   . Osteoarthrosis, unspecified whether generalized or localized, unspecified site   . Other and unspecified hyperlipidemia   . Personal history of fall   . Sciatica   . Sebaceous cyst   . Senile osteoporosis   . Unspecified constipation   . Unspecified essential hypertension   . Unspecified hereditary and idiopathic peripheral neuropathy   . Unspecified hypothyroidism   . Unspecified tinnitus     Past Surgical History:  Procedure Laterality Date  . CATARACT EXTRACTION, BILATERAL    . CHOLECYSTECTOMY  07/25/2007  . TONSILLECTOMY AND ADENOIDECTOMY Bilateral 1943    There were no vitals filed for this visit.      Subjective Assessment - 03/14/16 0934    Subjective I am feeling better, walking better.    Diagnostic tests  x-ray: lumbar spondylosis   Patient Stated Goals improve leg strength, improve balance   Currently in Pain? No/denies            [facility] PT Assessment - 03/14/16 0001      Timed Up and Go Test   TUG Normal TUG   Normal TUG (seconds) 11.1                     [facility] Adult PT Treatment/Exercise - 03/14/16 0001      Lumbar Exercises: Aerobic   Stationary Bike bike x 6 minutes     Lumbar Exercises: Supine   Ab Set 10 reps;3 seconds  tactile cues   Bridge 20 reps   Bridge Limitations bridge with adduction squeeze x 20   Other Supine Lumbar Exercises adduction squeeze x 20     Knee/Hip Exercises: Standing   Hip Abduction Stengthening;Both;2 sets;10 reps  2#   Hip Extension Stengthening;Both;2 sets;10 reps  2#   Wall Squat --  kitchen sink squat 2 x 15   Other Standing Knee Exercises heel raises 2 x 10                  PT Short Term Goals - 03/10/16 1009      PT SHORT TERM GOAL #1   Title be independent in initial HEP   Status Achieved     PT SHORT TERM GOAL #  2   Title improve LE strength to perform sit to stand with minimal UE assistance    Status Achieved     PT SHORT TERM GOAL #3   Title perform TUG in < or = to 14 seconds with no instability with change of direction   Status Achieved           PT Long Term Goals - 03/14/16 1005      PT LONG TERM GOAL #1   Title be independent in advanced HEP   Status On-going     PT LONG TERM GOAL #2   Title reduce FOTO to < or = to 31% limitation   Status On-going     PT LONG TERM GOAL #3   Title demonstrate 4+/5 bilateral hip strength to improve stability with walking long distances   Status On-going     PT LONG TERM GOAL #4   Title perform TUG in < or = to 12 seconds with no instability with change of direction   Status Achieved  11.1 seconds 7/31     PT LONG TERM GOAL #5   Title report postural corrections in standing to improve stabiltiy and balance   Status Achieved     PT LONG  TERM GOAL #6   Title report a 30% reduction in LBP with long periods of standing   Status Achieved               Plan - 03/14/16 1008    Clinical Impression Statement Pt feeling much better today, met TUG goal (11.1 seconds), pt states she feels comfortable with HEP, d/c after next visit   Rehab Potential Good   PT Frequency 2x / week   PT Duration 8 weeks   PT Treatment/Interventions ADLs/Self Care Home Management;Cryotherapy;Electrical Stimulation;Moist Heat;Therapeutic exercise;Therapeutic activities;Stair training;Functional mobility training;Gait training;Ultrasound;Neuromuscular re-education;Patient/family education;Manual techniques;Passive range of motion   PT Next Visit Plan d/c after visit, review HEP, test LE strength, FOTO   Consulted and Agree with Plan of Care Patient      Patient will benefit from skilled therapeutic intervention in order to improve the following deficits and impairments:  Abnormal gait, Difficulty walking, Pain, Postural dysfunction, Decreased balance, Improper body mechanics, Decreased activity tolerance, Decreased endurance  Visit Diagnosis: Bilateral low back pain without sciatica  Muscle weakness (generalized)  Difficulty in walking, not elsewhere classified  Abnormal posture     Problem List Patient Active Problem List   Diagnosis Date Noted  . Bilateral knee pain 12/08/2015  . Abnormality of gait 08/11/2015  . Edema 08/11/2015  . Bunion 08/11/2015  . Quadriceps weakness 08/11/2015  . Anxiety state 03/10/2015  . Palpitations 02/18/2015  . GERD (gastroesophageal reflux disease) 11/05/2014  . Hypothyroidism   . Hyperlipidemia   . Essential hypertension   . Macular degeneration (senile) of retina   . Diaphragmatic hernia   . Insomnia   . Hereditary and idiopathic peripheral neuropathy     Isabelle Course, PT, DPT 03/14/2016, 10:10 AM  [facility] [address] [place], [place], [address] Phone: [phone]   Fax:  [phone]  Name: [name] MRN: [id number] Date of Birth: [birth year]

## 2016-03-16 DIAGNOSIS — H353221 Exudative age-related macular degeneration, left eye, with active choroidal neovascularization: Secondary | ICD-10-CM | POA: Diagnosis not present

## 2016-03-16 DIAGNOSIS — H353211 Exudative age-related macular degeneration, right eye, with active choroidal neovascularization: Secondary | ICD-10-CM | POA: Diagnosis not present

## 2016-03-16 DIAGNOSIS — H35352 Cystoid macular degeneration, left eye: Secondary | ICD-10-CM | POA: Diagnosis not present

## 2016-03-16 DIAGNOSIS — H353124 Nonexudative age-related macular degeneration, left eye, advanced atrophic with subfoveal involvement: Secondary | ICD-10-CM | POA: Diagnosis not present

## 2016-03-17 ENCOUNTER — Ambulatory Visit: Payer: Medicare Other | Attending: Orthopaedic Surgery | Admitting: Physical Therapy

## 2016-03-17 DIAGNOSIS — M545 Low back pain, unspecified: Secondary | ICD-10-CM

## 2016-03-17 DIAGNOSIS — R262 Difficulty in walking, not elsewhere classified: Secondary | ICD-10-CM | POA: Diagnosis not present

## 2016-03-17 DIAGNOSIS — M6281 Muscle weakness (generalized): Secondary | ICD-10-CM | POA: Diagnosis not present

## 2016-03-17 DIAGNOSIS — R293 Abnormal posture: Secondary | ICD-10-CM | POA: Insufficient documentation

## 2016-03-17 NOTE — Therapy (Addendum)
[facility] [address] [place], [place], [address] Phone: [phone]   Fax:  [phone]  Physical Therapy Treatment  Patient Details  Name: [name] MRN: [id number] Date of Birth: 01/10/1931 Referring Provider: Joni Fears, MD  Encounter Date: 03/17/2016      PT End of Session - 03/17/16 1000    Visit Number 8   Number of Visits 10   Date for PT Re-Evaluation 04/14/16   PT Start Time 0930   PT Stop Time 1008   PT Time Calculation (min) 38 min   Activity Tolerance Patient tolerated treatment well   Behavior During Therapy [facility] for tasks assessed/performed      Past Medical History:  Diagnosis Date  . Allergic rhinitis due to pollen   . Anxiety state, unspecified   . Calculus of kidney   . Cataract   . Diaphragmatic hernia without mention of obstruction or gangrene   . Diffuse cystic mastopathy   . Dizziness and giddiness   . GERD (gastroesophageal reflux disease)   . Hiatal hernia   . Insomnia, unspecified   . Insomnia, unspecified   . Lumbago   . Macular degeneration (senile) of retina, unspecified   . Open wound of toe(s), without mention of complication   . Osteoarthrosis, unspecified whether generalized or localized, unspecified site   . Other and unspecified hyperlipidemia   . Personal history of fall   . Sciatica   . Sebaceous cyst   . Senile osteoporosis   . Unspecified constipation   . Unspecified essential hypertension   . Unspecified hereditary and idiopathic peripheral neuropathy   . Unspecified hypothyroidism   . Unspecified tinnitus     Past Surgical History:  Procedure Laterality Date  . CATARACT EXTRACTION, BILATERAL    . CHOLECYSTECTOMY  07/25/2007  . TONSILLECTOMY AND ADENOIDECTOMY Bilateral 1943    There were no vitals filed for this visit.      Subjective Assessment - 03/17/16 0934    Subjective I feel so much better, I wish I would have come to PT sooner    Pertinent History osteoporosis   Patient Stated Goals improve leg strength, improve balance   Currently in Pain? No/denies            [facility] PT Assessment - 03/17/16 0001      Strength   Overall Strength Comments bilat hips 4/5                     [facility] Adult PT Treatment/Exercise - 03/17/16 0001      Lumbar Exercises: Aerobic   Stationary Bike bike level 2 x 6 minutes     Lumbar Exercises: Supine   Bridge 20 reps   Bridge Limitations bridge with add squeeze x 20   Other Supine Lumbar Exercises add squeeze x 20     Knee/Hip Exercises: Standing   Hip Abduction Stengthening;Both;2 sets;10 reps  2#   Hip Extension Stengthening;Both;2 sets;10 reps  2#   Wall Squat --  kitchen sink squats x 20   Other Standing Knee Exercises heel raises 2 x 10                PT Education - 03/17/16 0959    Education provided Yes   Education Details continue HEP   Person(s) Educated Patient   Methods Explanation;Demonstration   Comprehension Returned demonstration;Verbalized understanding          PT Short Term Goals - 03/17/16 [phone]  PT SHORT TERM GOAL #1   Title be independent in initial HEP   Status Achieved     PT SHORT TERM GOAL #2   Title improve LE strength to perform sit to stand with minimal UE assistance    Status Achieved     PT SHORT TERM GOAL #3   Title perform TUG in < or = to 14 seconds with no instability with change of direction   Status Achieved           PT Long Term Goals - 29-Mar-2016 0948      PT LONG TERM GOAL #2   Title reduce FOTO to < or = to 31% limitation   Status Not Met  37% limited March 30, 2023               Plan - 2016-03-29 1000    Clinical Impression Statement Pt feels much better. Has met 4/5 LTGs, able to complete daily activities with less pain and fatigue. Pt states she feels her legs are stronger and she is better able to feel her feet despite neuropathy.  Pt to be d/c'd from PT today.   Rehab Potential Good    PT Frequency 2x / week   PT Duration 8 weeks   PT Treatment/Interventions ADLs/Self Care Home Management;Cryotherapy;Electrical Stimulation;Moist Heat;Therapeutic exercise;Therapeutic activities;Stair training;Functional mobility training;Gait training;Ultrasound;Neuromuscular re-education;Patient/family education;Manual techniques;Passive range of motion   PT Next Visit Plan d/c today   Consulted and Agree with Plan of Care Patient      Patient will benefit from skilled therapeutic intervention in order to improve the following deficits and impairments:  Abnormal gait, Difficulty walking, Pain, Postural dysfunction, Decreased balance, Improper body mechanics, Decreased activity tolerance, Decreased endurance  Visit Diagnosis: Bilateral low back pain without sciatica  Muscle weakness (generalized)  Difficulty in walking, not elsewhere classified  Abnormal posture       G-Codes - 03/29/16 0948    Functional Assessment Tool Used FOTO: 37% limitation   Functional Limitation Other PT primary   Other PT Primary Current Status (K1601) At least 20 percent but less than 40 percent impaired, limited or restricted   Other PT Primary Goal Status (U9323) At least 20 percent but less than 40 percent impaired, limited or restricted   Other PT Primary Discharge Status (F5732) At least 20 percent but less than 40 percent impaired, limited or restricted      Problem List Patient Active Problem List   Diagnosis Date Noted  . Bilateral knee pain 12/08/2015  . Abnormality of gait 08/11/2015  . Edema 08/11/2015  . Bunion 08/11/2015  . Quadriceps weakness 08/11/2015  . Anxiety state 03/10/2015  . Palpitations 02/18/2015  . GERD (gastroesophageal reflux disease) 11/05/2014  . Hypothyroidism   . Hyperlipidemia   . Essential hypertension   . Macular degeneration (senile) of retina   . Diaphragmatic hernia   . Insomnia   . Hereditary and idiopathic peripheral neuropathy     Isabelle Course, PT, DPT Mar 29, 2016, 10:06 AM PHYSICAL THERAPY DISCHARGE SUMMARY  Visits from Start of Care: 8  Current functional level related to goals / functional outcomes: See above for current status.  Pt didn't return after PT visit on 03-29-2016.   Remaining deficits: See above   Education / Equipment: HEP Plan: Patient agrees to discharge.  Patient goals were partially met. Patient is being discharged due to not returning since the last visit.  ?????        Sigurd Sos, PT 07/05/16 8:17 AM  [facility] [address] [place], [place], [address] Phone: [phone]   Fax:  [phone]  Name: [name] MRN: [id number] Date of Birth: 1931/04/30

## 2016-03-29 ENCOUNTER — Other Ambulatory Visit: Payer: Self-pay | Admitting: Internal Medicine

## 2016-04-01 ENCOUNTER — Other Ambulatory Visit: Payer: Self-pay

## 2016-04-01 DIAGNOSIS — E039 Hypothyroidism, unspecified: Secondary | ICD-10-CM

## 2016-04-01 DIAGNOSIS — I10 Essential (primary) hypertension: Secondary | ICD-10-CM

## 2016-04-11 ENCOUNTER — Other Ambulatory Visit: Payer: Self-pay | Admitting: Internal Medicine

## 2016-04-11 ENCOUNTER — Other Ambulatory Visit: Payer: Medicare Other

## 2016-04-11 DIAGNOSIS — E785 Hyperlipidemia, unspecified: Secondary | ICD-10-CM | POA: Diagnosis not present

## 2016-04-11 DIAGNOSIS — I10 Essential (primary) hypertension: Secondary | ICD-10-CM | POA: Diagnosis not present

## 2016-04-11 DIAGNOSIS — E039 Hypothyroidism, unspecified: Secondary | ICD-10-CM

## 2016-04-11 LAB — COMPLETE METABOLIC PANEL WITH GFR
ALBUMIN: 3.8 g/dL (ref 3.6–5.1)
ALK PHOS: 79 U/L (ref 33–130)
ALT: 12 U/L (ref 6–29)
AST: 17 U/L (ref 10–35)
BUN: 12 mg/dL (ref 7–25)
CO2: 33 mmol/L — ABNORMAL HIGH (ref 20–31)
Calcium: 9.5 mg/dL (ref 8.6–10.4)
Chloride: 94 mmol/L — ABNORMAL LOW (ref 98–110)
Creat: 0.74 mg/dL (ref 0.60–0.88)
GFR, EST NON AFRICAN AMERICAN: 75 mL/min (ref >=60)
GFR, Est African American: 87 mL/min (ref >=60)
GLUCOSE: 88 mg/dL (ref 65–99)
POTASSIUM: 4.1 mmol/L (ref 3.5–5.3)
SODIUM: 137 mmol/L (ref 135–146)
Total Bilirubin: 0.4 mg/dL (ref 0.2–1.2)
Total Protein: 6.1 g/dL (ref 6.1–8.1)

## 2016-04-11 LAB — LIPID PANEL
CHOL/HDL RATIO: 3.9 ratio (ref <=5.0)
Cholesterol: 193 mg/dL (ref 125–200)
HDL: 49 mg/dL (ref >=46)
LDL Cholesterol: 113 mg/dL (ref <130)
TRIGLYCERIDES: 155 mg/dL — AB (ref <150)
VLDL: 31 mg/dL — ABNORMAL HIGH (ref <30)

## 2016-04-11 LAB — TSH: TSH: 3.39 m[IU]/L

## 2016-04-13 ENCOUNTER — Encounter: Payer: Self-pay | Admitting: Internal Medicine

## 2016-04-13 ENCOUNTER — Ambulatory Visit (INDEPENDENT_AMBULATORY_CARE_PROVIDER_SITE_OTHER): Payer: Medicare Other | Admitting: Internal Medicine

## 2016-04-13 VITALS — BP 128/84 | HR 59 | Temp 97.5°F | Ht 63.0 in | Wt 132.0 lb

## 2016-04-13 DIAGNOSIS — E785 Hyperlipidemia, unspecified: Secondary | ICD-10-CM | POA: Diagnosis not present

## 2016-04-13 DIAGNOSIS — M4306 Spondylolysis, lumbar region: Secondary | ICD-10-CM | POA: Diagnosis not present

## 2016-04-13 DIAGNOSIS — F411 Generalized anxiety disorder: Secondary | ICD-10-CM

## 2016-04-13 DIAGNOSIS — E039 Hypothyroidism, unspecified: Secondary | ICD-10-CM | POA: Diagnosis not present

## 2016-04-13 DIAGNOSIS — M25561 Pain in right knee: Secondary | ICD-10-CM

## 2016-04-13 DIAGNOSIS — M25562 Pain in left knee: Secondary | ICD-10-CM

## 2016-04-13 DIAGNOSIS — I10 Essential (primary) hypertension: Secondary | ICD-10-CM | POA: Diagnosis not present

## 2016-04-13 DIAGNOSIS — R609 Edema, unspecified: Secondary | ICD-10-CM

## 2016-04-13 NOTE — Progress Notes (Signed)
Facility  [place]    Place of Service:   OFFICE    Allergies  Allergen Reactions  . Ambien [Zolpidem Tartrate]     hallucination  . Bactrim [Sulfamethoxazole-Trimethoprim]   . Morphine And Related Nausea And Vomiting  . Other     z pack     Chief Complaint  Patient presents with  . Medical Management of Chronic Issues    blood pressure, thyroid, GERD, edema, anxiety    HPI:  Essential hypertension - controlled  Hypothyroidism, unspecified hypothyroidism type - compensated  Hyperlipidemia - controlled  Edema, unspecified type - improved  Anxiety state - improved  Bilateral knee pain - saw Dr. Durward Fortes and went to rehab. He asked her to get off Lipitor 3 weeks ago. It may have helped. The muscle ache went away.She remains off Lipitor at this time.  Lumbar spondylolysis - saw Dr. Durward Fortes and went to rehab. She feels that her back is doing much better at this point in time.  Macular degeneration - stopping injections. Lost 1/2 her vision. Interferes with balance. Has to be careful when moving.   Numbness in the feet. Neuropathy in the feet is due to problems in he back.    Medications: Patient's Medications  New Prescriptions   No medications on file  Previous Medications   ALPRAZOLAM (XANAX) 1 MG TABLET    TAKE 1 TABLET BY MOUTH UP TO 3 TIMES DAILY AS NEEDED FOR ANXIETY OR REST   BEVACIZUMAB (AVASTIN) 100 MG/4ML SOLN    Inject monthly in right eye every 6 weeks.   DOXAZOSIN (CARDURA) 4 MG TABLET    TAKE 1 TABLET BY MOUTH DAILY   INDAPAMIDE (LOZOL) 1.25 MG TABLET    TAKE 1 TABLET BY MOUTH DAILY   LEVOTHYROXINE (SYNTHROID, LEVOTHROID) 100 MCG TABLET    One daily for thyroid supplement   LORATADINE (CLARITIN) 10 MG TABLET    Take 10 mg by mouth daily. Take one tablet once a day as needed for allergies   LOSARTAN (COZAAR) 50 MG TABLET    Take 1 tablet (50 mg total) by mouth daily.   METOPROLOL SUCCINATE (TOPROL-XL) 100 MG 24 HR TABLET    Take 1 tablet by mouth  daily with a meal to control blood pressure.   PANTOPRAZOLE (PROTONIX) 40 MG TABLET    Take one tablet by mouth in the morning 30 minutes before breakfast for stomach   POLYETHYLENE GLYCOL (MIRALAX / GLYCOLAX) PACKET    Take 17 g by mouth daily. Mix 17 grams in a 8 oz glass for constipation needed   RANIBIZUMAB (LUCENTIS) 0.3 MG/0.05ML SOLN    Inject into left eye every 5 weeks.   SERTRALINE (ZOLOFT) 50 MG TABLET    Take 50 mg by mouth. Take 1/2 tablet daily to calm nerves   TEMAZEPAM (RESTORIL) 30 MG CAPSULE    TAKE ONE CAPSULE BY MOUTH AT BEDTIME AS NEEDED FOR SLEEP   ZOSTAVAX 67893 UNT/0.65ML INJECTION    TO BE ADMINISTERED BY PHARMACIST FOR IMMUNIZATION  Modified Medications   No medications on file  Discontinued Medications   ATORVASTATIN (LIPITOR) 20 MG TABLET    TAKE 1 TABLET BY MOUTH EVERY DAY   SERTRALINE (ZOLOFT) 50 MG TABLET    Take 1 tablet (50 mg total) by mouth daily. To calm nerves    Review of Systems  Constitutional: Negative.  Negative for activity change.  Eyes: Positive for visual disturbance (Macular degeneration.).  Respiratory: Negative for cough, chest tightness, shortness of  breath and wheezing.        History of asthma.  Cardiovascular: Negative for chest pain, palpitations and leg swelling.  Gastrointestinal: Positive for constipation.       History of hemorrhoids. Abdominal bloating, gas, and rumbling have improved on pantoprazole.  Endocrine: Positive for polyuria.       History hypothyroidism  Genitourinary: Positive for menstrual problem. Negative for flank pain, frequency, hematuria and urgency.       History kidney stones at age 89.  Musculoskeletal: Positive for arthralgias, back pain and gait problem. Negative for myalgias.  Skin: Negative.   Allergic/Immunologic: Negative.   Neurological: Negative for dizziness, light-headedness and headaches.       Tingling and numbness in feet. Has had episodes of numbness in the right fifth finger and in the right  hand.  Hematological: Negative.   Psychiatric/Behavioral: Positive for sleep disturbance. Negative for confusion, decreased concentration and dysphoric mood. The patient is nervous/anxious.     Vitals:   04/13/16 1158  BP: 128/84  Pulse: (!) 59  Temp: 97.5 F (36.4 C)  TempSrc: Oral  SpO2: 95%  Weight: 132 lb (59.9 kg)  Height: _0  (1.6 m)   Body mass index is 23.38 kg/m. Wt Readings from Last 3 Encounters:  04/13/16 132 lb (59.9 kg)  12/08/15 138 lb (62.6 kg)  08/11/15 136 lb 3.2 oz (61.8 kg)      Physical Exam  Constitutional: She is oriented to person, place, and time. She appears well-nourished. No distress.  HENT:  Head: Normocephalic and atraumatic.  Right Ear: External ear normal.  Left Ear: External ear normal.  Nose: Nose normal.  Mouth/Throat: Oropharynx is clear and moist.  Partial hearing loss in both ears.  Eyes: Conjunctivae and EOM are normal. Pupils are equal, round, and reactive to light.  Corrective lenses.  Cardiovascular: Normal rate, regular rhythm, normal heart sounds and intact distal pulses.  Exam reveals no gallop and no friction rub.   No murmur heard. Pulmonary/Chest: No respiratory distress. She has no wheezes. She has no rales.  Abdominal: Bowel sounds are normal. She exhibits no distension and no mass. There is no tenderness.  Musculoskeletal: Normal range of motion. She exhibits tenderness (lower back and knees). She exhibits no edema.  Neurological: She is alert and oriented to person, place, and time. She has normal reflexes. No cranial nerve deficit. Coordination normal.  10/28/14 MMSE 29/30. Passed clock drawing. Numbness in the feet has progressed from the toes to about halfway up the foot. She thinks this may impair her gait.  Skin: No rash noted. No erythema. No pallor.  Multiple seborrheic keratoses. Skin tears of both forearms.  Psychiatric: Her behavior is normal. Judgment and thought content normal.  Anxious and possibly  depressed.    Labs reviewed: Lab Summary Latest Ref Rng & Units 04/11/2016 12/04/2015 07/30/2015  Hemoglobin - (None) (None) (None)  Hematocrit - (None) (None) (None)  White count - (None) (None) (None)  Platelet count - (None) (None) (None)  Sodium 135 - 146 mmol/L 137 135 137  Potassium 3.5 - 5.3 mmol/L 4.1 3.7 3.6  Calcium 8.6 - 10.4 mg/dL 9.5 9.3 9.0  Phosphorus - (None) (None) (None)  Creatinine 0.60 - 0.88 mg/dL 0.74 0.67 0.69  AST 10 - 35 U/L 17 (None) 21  Alk Phos 33 - 130 U/L 79 (None) 106  Bilirubin 0.2 - 1.2 mg/dL 0.4 (None) 0.5  Glucose 65 - 99 mg/dL 88 82 92  Cholesterol 125 - 200 mg/dL 193 (  None) (None)  HDL cholesterol >=46 mg/dL 49 (None) 59  Triglycerides <150 mg/dL 155(H) (None) 77  LDL Direct - (None) (None) (None)  LDL Calc <130 mg/dL 113 (None) 57  Total protein 6.1 - 8.1 g/dL 6.1 (None) (None)  Albumin 3.6 - 5.1 g/dL 3.8 (None) 3.8  Some recent data might be hidden   Lab Results  Component Value Date   TSH 3.39 04/11/2016   TSH 11.460 (H) 12/04/2015   TSH 8.050 (H) 07/30/2015   T4TOTAL 9.2 04/29/2013   Lab Results  Component Value Date   BUN 12 04/11/2016   BUN 9 12/04/2015   BUN 12 07/30/2015   Lab Results  Component Value Date   HGBA1C  07/23/2007    5.7 (NOTE)   The ADA recommends the following therapeutic goals for glycemic   control related to Hgb A1C measurement:   Goal of Therapy:   < 7.0% Hgb A1C   Action Suggested:  > 8.0% Hgb A1C   Ref:  Diabetes Care, 22, Suppl. 1, 1999    Assessment/Plan 1. Essential hypertension controlled - Basic metabolic panel; Future  2. Hypothyroidism, unspecified hypothyroidism type compensated - TSH; Future  3. Hyperlipidemia I recommended that she stay off statins for the present time since her cholesterol is normal  - Lipid panel; Future  4. Edema, unspecified type controlled with compression stockings  5. Anxiety state Improved  6. Bilateral knee pain Improved  7. Lumbar  spondylolysis Improved

## 2016-04-14 DIAGNOSIS — H353211 Exudative age-related macular degeneration, right eye, with active choroidal neovascularization: Secondary | ICD-10-CM | POA: Diagnosis not present

## 2016-04-14 DIAGNOSIS — H353221 Exudative age-related macular degeneration, left eye, with active choroidal neovascularization: Secondary | ICD-10-CM | POA: Diagnosis not present

## 2016-04-17 ENCOUNTER — Other Ambulatory Visit: Payer: Self-pay | Admitting: Internal Medicine

## 2016-04-17 DIAGNOSIS — I10 Essential (primary) hypertension: Secondary | ICD-10-CM

## 2016-04-19 NOTE — Telephone Encounter (Signed)
rx sent to pharmacy by e-script  

## 2016-04-27 ENCOUNTER — Other Ambulatory Visit: Payer: Self-pay | Admitting: Nurse Practitioner

## 2016-04-28 ENCOUNTER — Encounter: Payer: Medicare Other | Admitting: Neurology

## 2016-04-28 ENCOUNTER — Other Ambulatory Visit: Payer: Self-pay | Admitting: Nurse Practitioner

## 2016-04-28 DIAGNOSIS — G629 Polyneuropathy, unspecified: Secondary | ICD-10-CM | POA: Diagnosis not present

## 2016-04-28 DIAGNOSIS — B351 Tinea unguium: Secondary | ICD-10-CM | POA: Diagnosis not present

## 2016-04-28 DIAGNOSIS — L600 Ingrowing nail: Secondary | ICD-10-CM | POA: Diagnosis not present

## 2016-04-29 ENCOUNTER — Other Ambulatory Visit: Payer: Self-pay | Admitting: *Deleted

## 2016-04-29 MED ORDER — TEMAZEPAM 30 MG PO CAPS: 30.0000 mg | ORAL_CAPSULE | Freq: Every evening | ORAL | 1 refills | Status: DC | PRN

## 2016-04-29 NOTE — Telephone Encounter (Signed)
Patient requested. Called into pharmacy.

## 2016-05-03 ENCOUNTER — Other Ambulatory Visit: Payer: Self-pay | Admitting: Internal Medicine

## 2016-05-03 DIAGNOSIS — I10 Essential (primary) hypertension: Secondary | ICD-10-CM

## 2016-05-16 ENCOUNTER — Ambulatory Visit (INDEPENDENT_AMBULATORY_CARE_PROVIDER_SITE_OTHER): Payer: Medicare Other

## 2016-05-16 ENCOUNTER — Telehealth: Payer: Self-pay | Admitting: *Deleted

## 2016-05-16 DIAGNOSIS — I10 Essential (primary) hypertension: Secondary | ICD-10-CM

## 2016-05-16 DIAGNOSIS — Z23 Encounter for immunization: Secondary | ICD-10-CM | POA: Diagnosis not present

## 2016-05-16 DIAGNOSIS — E039 Hypothyroidism, unspecified: Secondary | ICD-10-CM

## 2016-05-16 NOTE — Telephone Encounter (Signed)
Patient wants a Hemoglobin added to her bloodwork due to feeling bad. Thinks her iron is low. CBC added.

## 2016-05-19 DIAGNOSIS — H353221 Exudative age-related macular degeneration, left eye, with active choroidal neovascularization: Secondary | ICD-10-CM | POA: Diagnosis not present

## 2016-05-23 ENCOUNTER — Other Ambulatory Visit: Payer: Self-pay | Admitting: Nurse Practitioner

## 2016-06-21 DIAGNOSIS — H353221 Exudative age-related macular degeneration, left eye, with active choroidal neovascularization: Secondary | ICD-10-CM | POA: Diagnosis not present

## 2016-06-23 ENCOUNTER — Other Ambulatory Visit: Payer: Self-pay | Admitting: Internal Medicine

## 2016-06-29 DIAGNOSIS — H353211 Exudative age-related macular degeneration, right eye, with active choroidal neovascularization: Secondary | ICD-10-CM | POA: Diagnosis not present

## 2016-07-01 ENCOUNTER — Ambulatory Visit (INDEPENDENT_AMBULATORY_CARE_PROVIDER_SITE_OTHER): Payer: Medicare Other | Admitting: Internal Medicine

## 2016-07-01 ENCOUNTER — Telehealth: Payer: Self-pay

## 2016-07-01 VITALS — BP 148/84 | HR 57 | Wt 132.0 lb

## 2016-07-01 DIAGNOSIS — I10 Essential (primary) hypertension: Secondary | ICD-10-CM | POA: Diagnosis not present

## 2016-07-01 NOTE — Telephone Encounter (Signed)
Patient called back, appt 08/03/16.

## 2016-07-01 NOTE — Telephone Encounter (Signed)
Try to work her I my schedule in Dec 2017.

## 2016-07-01 NOTE — Telephone Encounter (Signed)
Left message for patient to call back for appt in Dec with Dr. Chilton SiGreen

## 2016-07-01 NOTE — Telephone Encounter (Signed)
Patient came in for BP check no problems, no headache, no dizziness. On 11/15 BP 186/78, pulse 57 -done at drug store. On 11/16 BP 114/84 pulse 58-done drug. On 11/16 evening friend took BP 179/84 pulse 58. Last 2 days patient increase Cardura 4 mg one twice daily. On 11/14 patient got shot in eye for MD and always gets anxious with this. Usually takes Xanax 1/2 tablet twice daily. But this morning she took a whole one. This morning I got BP 148/84, pulse 57. Showed BP to Dr. Renato Gailseed, she suggested patient continue on Cardura 4 mg twice daily. Patient was pleased with this. Dr. Renato Gailseed suggest I forward note to Dr. Chilton SiGreen to see if he needs to see her before her appt 10/11/16.

## 2016-07-01 NOTE — Patient Instructions (Addendum)
Spoke with Dr. Renato Gailseed, she review blood pressures, she suggest patient continue with Cardura 4 mg twice daily. Forward note to Dr. Chilton SiGreen to see if he wants to see patient prior to her appt 10/11/16. Patient was pleased with this.

## 2016-07-03 ENCOUNTER — Other Ambulatory Visit: Payer: Self-pay | Admitting: Nurse Practitioner

## 2016-07-03 MED ORDER — DOXAZOSIN MESYLATE 4 MG PO TABS: 4.0000 mg | ORAL_TABLET | Freq: Two times a day (BID) | ORAL | 3 refills | Status: DC

## 2016-07-03 NOTE — Progress Notes (Signed)
Pt with increased anxiety and elevated BPs.  Advised to cont with increased cardura for now and f/u with Dr. Chilton SiGreen sooner than currently scheduled if possible.  She should monitor for dizziness.

## 2016-07-09 ENCOUNTER — Other Ambulatory Visit: Payer: Self-pay | Admitting: Internal Medicine

## 2016-07-27 DIAGNOSIS — H353221 Exudative age-related macular degeneration, left eye, with active choroidal neovascularization: Secondary | ICD-10-CM | POA: Diagnosis not present

## 2016-07-31 DIAGNOSIS — R0781 Pleurodynia: Secondary | ICD-10-CM | POA: Diagnosis not present

## 2016-08-02 ENCOUNTER — Emergency Department (HOSPITAL_COMMUNITY): Payer: Medicare Other

## 2016-08-02 ENCOUNTER — Inpatient Hospital Stay (HOSPITAL_COMMUNITY)
Admission: EM | Admit: 2016-08-02 | Discharge: 2016-08-04 | DRG: 203 | Disposition: A | Discharge status: Home / Self Care | Payer: Medicare Other | Attending: Family Medicine | Admitting: Family Medicine

## 2016-08-02 ENCOUNTER — Encounter (HOSPITAL_COMMUNITY): Payer: Self-pay | Admitting: Emergency Medicine

## 2016-08-02 DIAGNOSIS — E860 Dehydration: Secondary | ICD-10-CM | POA: Diagnosis not present

## 2016-08-02 DIAGNOSIS — M5489 Other dorsalgia: Secondary | ICD-10-CM | POA: Diagnosis not present

## 2016-08-02 DIAGNOSIS — I10 Essential (primary) hypertension: Secondary | ICD-10-CM | POA: Diagnosis not present

## 2016-08-02 DIAGNOSIS — J40 Bronchitis, not specified as acute or chronic: Secondary | ICD-10-CM | POA: Diagnosis present

## 2016-08-02 DIAGNOSIS — R059 Cough, unspecified: Secondary | ICD-10-CM

## 2016-08-02 DIAGNOSIS — R0682 Tachypnea, not elsewhere classified: Secondary | ICD-10-CM | POA: Diagnosis not present

## 2016-08-02 DIAGNOSIS — E876 Hypokalemia: Secondary | ICD-10-CM | POA: Diagnosis not present

## 2016-08-02 DIAGNOSIS — R0602 Shortness of breath: Secondary | ICD-10-CM | POA: Diagnosis not present

## 2016-08-02 DIAGNOSIS — R05 Cough: Secondary | ICD-10-CM

## 2016-08-02 DIAGNOSIS — E785 Hyperlipidemia, unspecified: Secondary | ICD-10-CM | POA: Diagnosis present

## 2016-08-02 DIAGNOSIS — M546 Pain in thoracic spine: Secondary | ICD-10-CM | POA: Diagnosis not present

## 2016-08-02 DIAGNOSIS — E039 Hypothyroidism, unspecified: Secondary | ICD-10-CM | POA: Diagnosis present

## 2016-08-02 DIAGNOSIS — T148XXA Other injury of unspecified body region, initial encounter: Secondary | ICD-10-CM | POA: Diagnosis not present

## 2016-08-02 DIAGNOSIS — Z881 Allergy status to other antibiotic agents status: Secondary | ICD-10-CM

## 2016-08-02 DIAGNOSIS — S299XXA Unspecified injury of thorax, initial encounter: Secondary | ICD-10-CM | POA: Diagnosis not present

## 2016-08-02 DIAGNOSIS — K219 Gastro-esophageal reflux disease without esophagitis: Secondary | ICD-10-CM | POA: Diagnosis present

## 2016-08-02 DIAGNOSIS — H3530 Unspecified macular degeneration: Secondary | ICD-10-CM | POA: Diagnosis present

## 2016-08-02 DIAGNOSIS — Z79899 Other long term (current) drug therapy: Secondary | ICD-10-CM

## 2016-08-02 DIAGNOSIS — Z8249 Family history of ischemic heart disease and other diseases of the circulatory system: Secondary | ICD-10-CM

## 2016-08-02 DIAGNOSIS — Z888 Allergy status to other drugs, medicaments and biological substances status: Secondary | ICD-10-CM

## 2016-08-02 DIAGNOSIS — J209 Acute bronchitis, unspecified: Principal | ICD-10-CM | POA: Diagnosis present

## 2016-08-02 DIAGNOSIS — R0902 Hypoxemia: Secondary | ICD-10-CM | POA: Diagnosis not present

## 2016-08-02 DIAGNOSIS — W19XXXA Unspecified fall, initial encounter: Secondary | ICD-10-CM

## 2016-08-02 DIAGNOSIS — S199XXA Unspecified injury of neck, initial encounter: Secondary | ICD-10-CM | POA: Diagnosis not present

## 2016-08-02 DIAGNOSIS — M545 Low back pain: Secondary | ICD-10-CM | POA: Diagnosis present

## 2016-08-02 DIAGNOSIS — G8929 Other chronic pain: Secondary | ICD-10-CM | POA: Diagnosis present

## 2016-08-02 LAB — HEPATIC FUNCTION PANEL
ALBUMIN: 3.5 g/dL (ref 3.5–5.0)
ALK PHOS: 68 U/L (ref 38–126)
ALT: 13 U/L — ABNORMAL LOW (ref 14–54)
AST: 21 U/L (ref 15–41)
BILIRUBIN TOTAL: 0.6 mg/dL (ref 0.3–1.2)
Total Protein: 5.9 g/dL — ABNORMAL LOW (ref 6.5–8.1)

## 2016-08-02 LAB — COMPREHENSIVE METABOLIC PANEL
ALT: 13 U/L — AB (ref 14–54)
AST: 19 U/L (ref 15–41)
Albumin: 3.4 g/dL — ABNORMAL LOW (ref 3.5–5.0)
Alkaline Phosphatase: 72 U/L (ref 38–126)
Anion gap: 8 (ref 5–15)
BUN: 11 mg/dL (ref 6–20)
CHLORIDE: 87 mmol/L — AB (ref 101–111)
CO2: 36 mmol/L — ABNORMAL HIGH (ref 22–32)
CREATININE: 0.74 mg/dL (ref 0.44–1.00)
Calcium: 8.3 mg/dL — ABNORMAL LOW (ref 8.9–10.3)
GFR calc Af Amer: >60 mL/min (ref >60)
Glucose, Bld: 107 mg/dL — ABNORMAL HIGH (ref 65–99)
Potassium: 2.7 mmol/L — CL (ref 3.5–5.1)
Sodium: 131 mmol/L — ABNORMAL LOW (ref 135–145)
Total Bilirubin: 0.6 mg/dL (ref 0.3–1.2)
Total Protein: 6.1 g/dL — ABNORMAL LOW (ref 6.5–8.1)

## 2016-08-02 LAB — CBC WITH DIFFERENTIAL/PLATELET
BASOS PCT: 1 %
Basophils Absolute: 0 10*3/uL (ref 0.0–0.1)
Eosinophils Absolute: 0 10*3/uL (ref 0.0–0.7)
Eosinophils Relative: 0 %
HEMATOCRIT: 37.3 % (ref 36.0–46.0)
HEMOGLOBIN: 13.3 g/dL (ref 12.0–15.0)
LYMPHS ABS: 0.6 10*3/uL — AB (ref 0.7–4.0)
LYMPHS PCT: 14 %
MCH: 28.9 pg (ref 26.0–34.0)
MCHC: 35.7 g/dL (ref 30.0–36.0)
MCV: 81.1 fL (ref 78.0–100.0)
MONOS PCT: 13 %
Monocytes Absolute: 0.6 10*3/uL (ref 0.1–1.0)
NEUTROS ABS: 3.3 10*3/uL (ref 1.7–7.7)
NEUTROS PCT: 72 %
Platelets: 108 10*3/uL — ABNORMAL LOW (ref 150–400)
RBC: 4.6 MIL/uL (ref 3.87–5.11)
RDW: 13.3 % (ref 11.5–15.5)
WBC: 4.6 10*3/uL (ref 4.0–10.5)

## 2016-08-02 LAB — MAGNESIUM: MAGNESIUM: 1.5 mg/dL — AB (ref 1.7–2.4)

## 2016-08-02 LAB — I-STAT TROPONIN, ED: Troponin i, poc: 0 ng/mL (ref 0.00–0.08)

## 2016-08-02 LAB — PHOSPHORUS: Phosphorus: 2.1 mg/dL — ABNORMAL LOW (ref 2.5–4.6)

## 2016-08-02 LAB — D-DIMER, QUANTITATIVE (NOT AT ARMC): D DIMER QUANT: 0.59 ug{FEU}/mL — AB (ref 0.00–0.50)

## 2016-08-02 MED ORDER — POLYETHYLENE GLYCOL 3350 17 G PO PACK
17.0000 g | PACK | Freq: Every day | ORAL | Status: DC
  Administered 2016-08-02 – 2016-08-04 (×2): 17 g via ORAL
  Filled 2016-08-02 (×3): qty 1

## 2016-08-02 MED ORDER — LORATADINE 10 MG PO TABS
10.0000 mg | ORAL_TABLET | Freq: Every day | ORAL | Status: DC
  Administered 2016-08-02 – 2016-08-04 (×3): 10 mg via ORAL
  Filled 2016-08-02 (×3): qty 1

## 2016-08-02 MED ORDER — SERTRALINE HCL 50 MG PO TABS
50.0000 mg | ORAL_TABLET | Freq: Every day | ORAL | Status: DC
  Administered 2016-08-02 – 2016-08-04 (×3): 50 mg via ORAL
  Filled 2016-08-02 (×3): qty 1

## 2016-08-02 MED ORDER — POLYVINYL ALCOHOL 1.4 % OP SOLN
1.0000 [drp] | Freq: Two times a day (BID) | OPHTHALMIC | Status: DC
  Administered 2016-08-02 – 2016-08-04 (×4): 1 [drp] via OPHTHALMIC
  Filled 2016-08-02: qty 15

## 2016-08-02 MED ORDER — PANTOPRAZOLE SODIUM 40 MG PO TBEC
40.0000 mg | DELAYED_RELEASE_TABLET | Freq: Every day | ORAL | Status: DC
  Administered 2016-08-02 – 2016-08-04 (×3): 40 mg via ORAL
  Filled 2016-08-02 (×3): qty 1

## 2016-08-02 MED ORDER — KETOROLAC TROMETHAMINE 0.5 % OP SOLN
1.0000 [drp] | Freq: Every day | OPHTHALMIC | Status: DC | PRN
  Filled 2016-08-02: qty 3

## 2016-08-02 MED ORDER — POTASSIUM CHLORIDE CRYS ER 20 MEQ PO TBCR
40.0000 meq | EXTENDED_RELEASE_TABLET | Freq: Once | ORAL | Status: AC
  Administered 2016-08-02: 40 meq via ORAL
  Filled 2016-08-02: qty 2

## 2016-08-02 MED ORDER — FLUTICASONE PROPIONATE 50 MCG/ACT NA SUSP
1.0000 | Freq: Every day | NASAL | Status: DC
  Administered 2016-08-02 – 2016-08-04 (×3): 1 via NASAL
  Filled 2016-08-02: qty 16

## 2016-08-02 MED ORDER — LEVOTHYROXINE SODIUM 100 MCG PO TABS
100.0000 ug | ORAL_TABLET | Freq: Every day | ORAL | Status: DC
  Administered 2016-08-03 – 2016-08-04 (×2): 100 ug via ORAL
  Filled 2016-08-02 (×2): qty 1

## 2016-08-02 MED ORDER — IPRATROPIUM-ALBUTEROL 0.5-2.5 (3) MG/3ML IN SOLN
3.0000 mL | Freq: Four times a day (QID) | RESPIRATORY_TRACT | Status: DC | PRN
  Administered 2016-08-03 – 2016-08-04 (×3): 3 mL via RESPIRATORY_TRACT
  Filled 2016-08-02 (×3): qty 3

## 2016-08-02 MED ORDER — DOXAZOSIN MESYLATE 4 MG PO TABS
4.0000 mg | ORAL_TABLET | Freq: Two times a day (BID) | ORAL | Status: DC
  Administered 2016-08-02 – 2016-08-04 (×4): 4 mg via ORAL
  Filled 2016-08-02 (×4): qty 1

## 2016-08-02 MED ORDER — CENTRUM SILVER PO TABS: 1.0000 | ORAL_TABLET | Freq: Every day | ORAL | Status: DC

## 2016-08-02 MED ORDER — DM-GUAIFENESIN ER 30-600 MG PO TB12
1.0000 | ORAL_TABLET | Freq: Two times a day (BID) | ORAL | Status: DC | PRN
  Administered 2016-08-02: 1 via ORAL
  Filled 2016-08-02: qty 1

## 2016-08-02 MED ORDER — ACETAMINOPHEN 325 MG PO TABS
650.0000 mg | ORAL_TABLET | Freq: Once | ORAL | Status: AC
  Administered 2016-08-02: 650 mg via ORAL
  Filled 2016-08-02: qty 2

## 2016-08-02 MED ORDER — ALBUTEROL SULFATE (2.5 MG/3ML) 0.083% IN NEBU: 5.0000 mg | INHALATION_SOLUTION | Freq: Once | RESPIRATORY_TRACT | Status: DC

## 2016-08-02 MED ORDER — SODIUM CHLORIDE 0.9% FLUSH
3.0000 mL | Freq: Two times a day (BID) | INTRAVENOUS | Status: DC
  Administered 2016-08-02 – 2016-08-04 (×4): 3 mL via INTRAVENOUS

## 2016-08-02 MED ORDER — METOPROLOL SUCCINATE ER 100 MG PO TB24
100.0000 mg | ORAL_TABLET | Freq: Every day | ORAL | Status: DC
  Administered 2016-08-02 – 2016-08-04 (×3): 100 mg via ORAL
  Filled 2016-08-02 (×3): qty 1

## 2016-08-02 MED ORDER — HEPARIN SODIUM (PORCINE) 5000 UNIT/ML IJ SOLN
5000.0000 [IU] | Freq: Three times a day (TID) | INTRAMUSCULAR | Status: DC
  Administered 2016-08-02 – 2016-08-04 (×5): 5000 [IU] via SUBCUTANEOUS
  Filled 2016-08-02 (×5): qty 1

## 2016-08-02 MED ORDER — TEMAZEPAM 15 MG PO CAPS
30.0000 mg | ORAL_CAPSULE | Freq: Every evening | ORAL | Status: DC | PRN
  Administered 2016-08-03: 30 mg via ORAL
  Filled 2016-08-02: qty 2

## 2016-08-02 MED ORDER — IPRATROPIUM-ALBUTEROL 0.5-2.5 (3) MG/3ML IN SOLN
3.0000 mL | Freq: Once | RESPIRATORY_TRACT | Status: AC
Start: 2016-08-02 — End: 2016-08-02
  Administered 2016-08-02: 3 mL via RESPIRATORY_TRACT
  Filled 2016-08-02: qty 3

## 2016-08-02 MED ORDER — LOSARTAN POTASSIUM 50 MG PO TABS
50.0000 mg | ORAL_TABLET | Freq: Every day | ORAL | Status: DC
  Administered 2016-08-02 – 2016-08-04 (×3): 50 mg via ORAL
  Filled 2016-08-02 (×3): qty 1

## 2016-08-02 MED ORDER — IOPAMIDOL (ISOVUE-370) INJECTION 76%
100.0000 mL | Freq: Once | INTRAVENOUS | Status: AC | PRN
  Administered 2016-08-02: 75 mL via INTRAVENOUS

## 2016-08-02 MED ORDER — DOXYCYCLINE HYCLATE 100 MG PO TABS
100.0000 mg | ORAL_TABLET | Freq: Two times a day (BID) | ORAL | Status: DC
  Administered 2016-08-02 – 2016-08-04 (×4): 100 mg via ORAL
  Filled 2016-08-02 (×4): qty 1

## 2016-08-02 MED ORDER — IOPAMIDOL (ISOVUE-370) INJECTION 76%
INTRAVENOUS | Status: AC
  Filled 2016-08-02: qty 100

## 2016-08-02 MED ORDER — SODIUM CHLORIDE 0.9 % IV SOLN: 30.0000 meq | Freq: Once | INTRAVENOUS | Status: DC

## 2016-08-02 MED ORDER — PROPYLENE GLYCOL 0.6 % OP SOLN: 1.0000 [drp] | Freq: Two times a day (BID) | OPHTHALMIC | Status: DC

## 2016-08-02 MED ORDER — INDAPAMIDE 1.25 MG PO TABS
1.2500 mg | ORAL_TABLET | Freq: Every day | ORAL | Status: DC
  Administered 2016-08-02 – 2016-08-04 (×3): 1.25 mg via ORAL
  Filled 2016-08-02 (×3): qty 1

## 2016-08-02 MED ORDER — ALPRAZOLAM 0.5 MG PO TABS
0.5000 mg | ORAL_TABLET | Freq: Three times a day (TID) | ORAL | Status: DC | PRN
  Administered 2016-08-02 – 2016-08-04 (×3): 0.5 mg via ORAL
  Filled 2016-08-02 (×3): qty 1

## 2016-08-02 MED ORDER — POTASSIUM CHLORIDE 10 MEQ/100ML IV SOLN
10.0000 meq | INTRAVENOUS | Status: AC
  Administered 2016-08-02 (×3): 10 meq via INTRAVENOUS
  Filled 2016-08-02 (×3): qty 100

## 2016-08-02 MED ORDER — IPRATROPIUM-ALBUTEROL 0.5-2.5 (3) MG/3ML IN SOLN: 3.0000 mL | Freq: Once | RESPIRATORY_TRACT | Status: DC

## 2016-08-02 MED ORDER — ADULT MULTIVITAMIN W/MINERALS CH
1.0000 | ORAL_TABLET | Freq: Every day | ORAL | Status: DC
  Administered 2016-08-02 – 2016-08-04 (×3): 1 via ORAL
  Filled 2016-08-02 (×3): qty 1

## 2016-08-02 NOTE — ED Notes (Signed)
Delay on EKG pt is not in the room at this time.

## 2016-08-02 NOTE — ED Notes (Signed)
O2 stat while ambulating pt 84%, had pt return to the bed and put on nasal cannula 2L

## 2016-08-02 NOTE — ED Notes (Signed)
ED Provider at bedside. 

## 2016-08-02 NOTE — ED Triage Notes (Addendum)
Per EMS, pt from home c/o back pain x3 days after falling. Pain increases with movement. Seen for fall and prescribed lidocaine patches. Patient also reports productive cough with SOB x3 days. Denies head injury and LOC. A&Ox4.  Patient denies SOB, reports productive cough. States her husband had the same "virus" last week.

## 2016-08-02 NOTE — ED Notes (Signed)
Charge RN called. Pt to be taken up before 1710

## 2016-08-02 NOTE — ED Notes (Signed)
Bed: [id number] Expected date:  Expected time:  Means of arrival:  Comments: EMS-fall/SOB

## 2016-08-02 NOTE — H&P (Signed)
History and Physical    [name] ZOX:[id number] DOB: 19-Jun-1931 DOA: 08/02/2016  Referring Provider: Dr. Judd Lien PCP: Murray Hodgkins, MD   Patient coming from: home   Chief Complaint: cough, general malaise, SOB  HPI: [name] is a 82 y.o. female with PMH significant for allergic rhinitis, anxiety, GERD, Macular degeneration, hypothyroidism and HTN; who presented to ED secondary to productive cough, SOB and general malaise. Patient reports symptoms present for the last 48 hours and worsening. Sick contacts (her husband) with URI symptoms and recent diagnosis for CAP. No hemoptysis and no nausea or vomiting. Endorses anorexia and subjective fever. In ED found to have hypoxia with O2 sat down to 84% on RA. Patient also mildly dehydrated and with potassium of 2.7.  Of note patient denies dysuria, hematuria, hematemesis, melena, HA's, blurred vision, focal weakness or any other complaints.  ED Course: patient received gentle IVF's resuscitation, nebulizer treatment and was started on oxygen supplementation. CXR demonstrated no infiltrates or consolidations; CT chest with positive bronchitic changes, no PE. TRH called to assess patient and to bring in for observation given acute hypoxia and high risk fro decompensation.  Review of Systems:  All other systems reviewed and apart from HPI, are negative.  Past Medical History:  Diagnosis Date  . Allergic rhinitis due to pollen   . Anxiety state, unspecified   . Calculus of kidney   . Cataract   . Diaphragmatic hernia without mention of obstruction or gangrene   . Diffuse cystic mastopathy   . Dizziness and giddiness   . GERD (gastroesophageal reflux disease)   . Hiatal hernia   . Insomnia, unspecified   . Insomnia, unspecified   . Lumbago   . Macular degeneration (senile) of retina, unspecified   . Open wound of toe(s), without mention of complication   . Osteoarthrosis, unspecified whether generalized or localized, unspecified site    . Other and unspecified hyperlipidemia   . Personal history of fall   . Sciatica   . Sebaceous cyst   . Senile osteoporosis   . Unspecified constipation   . Unspecified essential hypertension   . Unspecified hereditary and idiopathic peripheral neuropathy   . Unspecified hypothyroidism   . Unspecified tinnitus     Past Surgical History:  Procedure Laterality Date  . CATARACT EXTRACTION, BILATERAL    . CHOLECYSTECTOMY  07/25/2007  . TONSILLECTOMY AND ADENOIDECTOMY Bilateral 1943     reports that she has never smoked. She has never used smokeless tobacco. She reports that she does not drink alcohol or use drugs.  Allergies  Allergen Reactions  . Ambien [Zolpidem Tartrate]     hallucination  . Bactrim [Sulfamethoxazole-Trimethoprim]     Unknown reaction   . Morphine And Related Nausea And Vomiting  . Zithromax [Azithromycin]     Not effective      Family History  Problem Relation Age of Onset  . Hypertension Brother   . Fibromyalgia Daughter   . Hypertension Son   . Hypertension Brother   . Hyperlipidemia Brother     Prior to Admission medications   Medication Sig Start Date End Date Taking? Authorizing Provider  ALPRAZolam (XANAX) 1 MG tablet TAKE 1 TABLET BY MOUTH UP TO 3 TIMES DAILY AS NEEDED FOR ANXIETY OR REST Patient taking differently: take 1.5 mg by mouth at bed time, may also take 1 mg daily if needed for anxiety or rest 07/04/16  Yes Tiffany L Reed, DO  Bevacizumab (AVASTIN) 100 MG/4ML SOLN Inject monthly in right  eye every 6 weeks.   Yes Historical Provider, MD  Cyanocobalamin (VITAMIN B-12 PO) Take 1 tablet by mouth daily.   Yes Historical Provider, MD  dextromethorphan (DELSYM) 30 MG/5ML liquid Take 30 mg by mouth every 12 (twelve) hours as needed for cough.   Yes Historical Provider, MD  dextromethorphan-guaiFENesin (MUCINEX DM) 30-600 MG 12hr tablet Take 1 tablet by mouth every 12 (twelve) hours as needed for cough.   Yes Historical Provider, MD    doxazosin (CARDURA) 4 MG tablet Take 1 tablet (4 mg total) by mouth 2 (two) times daily. 07/03/16  Yes Tiffany L Reed, DO  indapamide (LOZOL) 1.25 MG tablet TAKE 1 TABLET BY MOUTH DAILY 04/11/16  Yes Sharon SellerJessica K Eubanks, NP  ketorolac (ACULAR) 0.5 % ophthalmic solution Place 1 drop into both eyes daily as needed (for eye pain).   Yes Historical Provider, MD  levothyroxine (SYNTHROID, LEVOTHROID) 100 MCG tablet One daily for thyroid supplement Patient taking differently: Take 100 mcg by mouth daily before breakfast.  12/08/15  Yes Kimber RelicArthur G Green, MD  loratadine (CLARITIN) 10 MG tablet Take 10 mg by mouth daily.    Yes Historical Provider, MD  losartan (COZAAR) 50 MG tablet TAKE 1 TABLET (50 MG TOTAL) BY MOUTH DAILY. 05/03/16  Yes Kimber RelicArthur G Green, MD  metoprolol succinate (TOPROL-XL) 100 MG 24 hr tablet TAKE 1 TABLET BY MOUTH EVERY DAY WITH A MEAL TO CONTROL BLOOD PRESSURE 04/19/16  Yes Kimber RelicArthur G Green, MD  Multiple Vitamins-Minerals (CENTRUM SILVER) tablet Take 1 tablet by mouth daily.   Yes Historical Provider, MD  pantoprazole (PROTONIX) 40 MG tablet Take one tablet by mouth in the morning 30 minutes before breakfast for stomach Patient taking differently: Take 40 mg by mouth daily with breakfast.  09/02/15  Yes Kimber RelicArthur G Green, MD  polyethylene glycol (MIRALAX / Ethelene HalGLYCOLAX) packet Take 17 g by mouth daily.    Yes Historical Provider, MD  Propylene Glycol (SYSTANE BALANCE) 0.6 % SOLN Place 1 drop into both eyes 2 (two) times daily.   Yes Historical Provider, MD  Ranibizumab (LUCENTIS) 0.3 MG/0.05ML SOLN Inject into left eye every 5 weeks.   Yes Historical Provider, MD  sertraline (ZOLOFT) 50 MG tablet TAKE 1 TABLET (50 MG TOTAL) BY MOUTH DAILY. TO CALM NERVES 05/03/16  Yes Kimber RelicArthur G Green, MD  temazepam (RESTORIL) 30 MG capsule TAKE ONE CAPSULE BY MOUTH AT BEDTIME AS NEEDED FOR SLEEP Patient taking differently: TAKE ONE CAPSULE BY MOUTH AT BEDTIME 06/23/16  Yes Sharon SellerJessica K Eubanks, NP  tobramycin (TOBREX) 0.3 %  ophthalmic solution Place 1 drop into both eyes 2 (two) times daily as needed (for dry eyes).   Yes Historical Provider, MD    Physical Exam: Vitals:   08/02/16 1258 08/02/16 1302 08/02/16 1439 08/02/16 1600  BP: 183/73  (!) 156/104 176/79  Pulse: 62  63 63  Resp: 15  18 15   Temp:      SpO2: 99%  100% 100%  Weight:  59.9 kg (132 lb)    Height:  5\' 3"  (1.6 m)      Constitutional: In mild distress, with intermittent coughing spells and requiring oxygen supplementation. Patient is afebrile and denies CP. Eyes: PERRLA, lids and conjunctivae normal, no icterus ENMT: Mucous membranes dry on examt. Posterior pharynx clear of any exudate or lesions. Mild congestion appreciated on her speech and patient reported some PND. Neck: normal, supple, no masses, no thyromegaly Respiratory: clear to auscultation bilaterally, no wheezing, no crackles. Positive tachypnea (especially with exertion).  No accessory muscle use.  Cardiovascular: S1 & S2 heard, regular rate and rhythm, no murmurs / rubs / gallops. No extremity edema. 2+ pedal pulses. No carotid bruits.  Abdomen: No distension, no tenderness, no masses palpated. No hepatosplenomegaly. Bowel sounds normal.  Musculoskeletal: no clubbing / cyanosis. No joint deformity upper and lower extremities. Good ROM, no contractures. Normal muscle tone.  Skin: no rashes or ulcers. No induration. Positive bruises appreciated (especially right hand and forearm) Neurologic: CN 2-12 grossly intact. Sensation intact, DTR normal. Strength 5/5 in all 4 limbs.  Psychiatric: Normal judgment and insight. Alert and oriented x 3. Normal mood.    Labs on Admission: I have personally reviewed following labs and imaging studies  CBC:  Recent Labs Lab 08/02/16 1016  WBC 4.6  NEUTROABS 3.3  HGB 13.3  HCT 37.3  MCV 81.1  PLT 108*   Basic Metabolic Panel:  Recent Labs Lab 08/02/16 1047  NA 131*  K 2.7*  CL 87*  CO2 36*  GLUCOSE 107*  BUN 11  CREATININE 0.74   CALCIUM 8.3*   GFR: Estimated Creatinine Clearance: 44.1 mL/min (by C-G formula based on SCr of 0.74 mg/dL).   Liver Function Tests:  Recent Labs Lab 08/02/16 1047  AST 19  ALT 13*  ALKPHOS 72  BILITOT 0.6  PROT 6.1*  ALBUMIN 3.4*   Urine analysis:    Component Value Date/Time   COLORURINE YELLOW 07/22/2007 2239   APPEARANCEUR CLEAR 07/22/2007 2239   LABSPEC 1.016 07/22/2007 2239   PHURINE 6.0 07/22/2007 2239   GLUCOSEU NEGATIVE 07/22/2007 2239   HGBUR SMALL (A) 07/22/2007 2239   BILIRUBINUR NEGATIVE 07/22/2007 2239   KETONESUR NEGATIVE 07/22/2007 2239   PROTEINUR NEGATIVE 07/22/2007 2239   UROBILINOGEN 1.0 07/22/2007 2239   NITRITE NEGATIVE 07/22/2007 2239   LEUKOCYTESUR NEGATIVE 07/22/2007 2239   Radiological Exams on Admission: Dg Chest 2 View  Result Date: 08/02/2016 CLINICAL DATA:  As post fall at home 3 days ago striking a dresser with persistent mid back and left lateral rib pain. Currently hypoxic. EXAM: CHEST  2 VIEW COMPARISON:  Chest x-ray of July 02, 2015 FINDINGS: The patient is rotated on the current study. The lungs are mildly hyperinflated. There is no focal infiltrate. There is no pleural effusion or pneumothorax. The cardiac silhouette is mildly enlarged though stable. The pulmonary vascularity is normal. There is calcification in the wall of the thoracic aorta. The thoracic vertebral bodies are preserved in height. The observed ribs are intact. IMPRESSION: Chronic bronchitic changes, stable. Stable mild cardiomegaly without pulmonary edema. No acute cardiopulmonary abnormality. Thoracic aortic atherosclerosis. Electronically Signed   By: David  [place] M.D.   On: 08/02/2016 10:20   Dg Ribs Unilateral Left  Result Date: 08/02/2016 CLINICAL DATA:  Status post fall against a dresser 3 days ago with persistent left lateral ribcage discomfort. EXAM: LEFT RIBS - 2 VIEW COMPARISON:  Chest x-ray of today's date FINDINGS: The left rib detail images exhibit no  acute fracture. There is no pleural effusion or pneumothorax. IMPRESSION: There is no acute rib fracture observed on today's study. Electronically Signed   By: David  [place] M.D.   On: 08/02/2016 10:22   Dg Cervical Spine Complete  Result Date: 08/02/2016 CLINICAL DATA:  Fall. EXAM: CERVICAL SPINE - COMPLETE 4+ VIEW COMPARISON:  No prior. FINDINGS: Diffuse multilevel degenerative change. C5-C6 4 mm anterolisthesis. No evidence of fracture or dislocation. No acute abnormality. Tiny calcified pulmonary nodule right apex consistent with granuloma . IMPRESSION: Diffuse multilevel degenerative  change. C5-C6 4 mm anterolisthesis. No evidence of fracture or dislocation . Electronically Signed   By: Maisie Fushomas  Register   On: 08/02/2016 10:23   Dg Thoracic Spine 2 View  Result Date: 08/02/2016 CLINICAL DATA:  Fall.  Pain. EXAM: THORACIC SPINE 2 VIEWS COMPARISON:  08/02/2016. FINDINGS: Thoracolumbar scoliosis and diffuse degenerative change. No acute bony abnormality identified. No evidence of fracture . IMPRESSION: Thoracolumbar spine scoliosis and diffuse degenerative change. No acute bony abnormality identified. Electronically Signed   By: Maisie Fushomas  Register   On: 08/02/2016 10:21   Ct Angio Chest Pe W/cm &/or Wo Cm  Result Date: 08/02/2016 CLINICAL DATA:  Shortness of breath, elevated D-dimer level. EXAM: CT ANGIOGRAPHY CHEST WITH CONTRAST TECHNIQUE: Multidetector CT imaging of the chest was performed using the standard protocol during bolus administration of intravenous contrast. Multiplanar CT image reconstructions and MIPs were obtained to evaluate the vascular anatomy. CONTRAST:  75 mL of Isovue 370 intravenously. COMPARISON:  Chest radiograph same day. FINDINGS: Cardiovascular: Atherosclerosis of thoracic aorta is noted without aneurysm formation. There is no definite evidence of pulmonary embolus. Coronary artery calcifications are noted. Mediastinum/Nodes: No enlarged mediastinal, hilar, or axillary lymph  nodes. Thyroid gland, trachea, and esophagus demonstrate no significant findings. Lungs/Pleura: No pneumothorax or pleural effusion is noted. Mild bibasilar posterior subsegmental atelectasis is noted. Upper Abdomen: No acute abnormality. Musculoskeletal: No chest wall abnormality. No acute or significant osseous findings. Review of the MIP images confirms the above findings. IMPRESSION: Aortic atherosclerosis. No definite evidence of pulmonary embolus. Coronary artery calcifications are noted suggesting coronary artery disease. Electronically Signed   By: Lupita RaiderJames  Green Jr, M.D.   On: 08/02/2016 12:57    EKG:  No acute ischemic changes, regular rate and rhythm   Assessment/Plan 1-acute bronchitis with hypoxia: patient reports sick contact (husband); endorses subjective fever and general malaise. Also with cough and increase SOB with exertion. -will bring for close observation given hypoxia -will provide oxygen supplementation and PRN nebulizer treatment -will treat with 5 days of doxycycline  -follow clinical response  -of note, patient CT scan with bronchitic changes, but no frank infiltrates and CXR w/o consolidations  -on RA her O2 sat drops into 84% and became tachypneic   2-Hypothyroidism: -will continue synthroid   3-hypokalemia -K 2.7 -most likely due to poor PO intake while not feeling good -IVF's given in ED -will replete potassium, monitor on telemetry  -EKG stable -will check Mg and PO4  4-Hyperlipidemia -will follow heart healthy diet  -not on statins at home  5-Essential hypertension -will resume home antihypertensive regimen  6-Macular degeneration (senile) of retina -continue home meds and outpatient follow up with ophthalmology   7-GERD (gastroesophageal reflux disease) -will continue PPI   DVT prophylaxis: heparin   Code Status: Full Code Family Communication: Son at bedside  Disposition Plan: anticipate discharge in less than 2 midnights; patient admitted  due to hypoxia and concerns for decompensation. Also with hypoxia. Anticipate improvement with interventions and hopefully discharge home once breathing stable. Consults called: none Admission status: observation, LOS < 2 midnights; telemetry bed   Vassie LollMadera, Niall Illes MD [facility] Pager [phone]  If 7PM-7AM, please contact night-coverage [email or web address] Password [email or web address]  08/02/2016, 5:30 PM

## 2016-08-02 NOTE — ED Provider Notes (Signed)
[facility]-EMERGENCY DEPT Provider Note   CSN: [id number] Arrival date & time: 08/02/16  0831     History   Chief Complaint Chief Complaint  Patient presents with  . Back Pain  . Cough    HPI [name] is a 82 y.o. female.  82 year old Caucasian female with a past medical history significant for chronic back pain, GERD, hypertension that presents to the ED today with cough and left lateral back pain. Patient is coming from home by EMS with complaints of a cough that started approximately 2-3 days ago. Patient states that it is a productive cough. She is coughing up thick white sputum. She denies any shortness of breath or chest pain. Patient did have a mechanical fall approximately 3 days ago and fell into the dresser. She was seen at urgent care. At that time she had negative x-rays performed and was given lidocaine patches. Patient states the pain is improved. She does have some mild tenderness with movement but states that the lidocaine patches that helped. Patient states that her husband had the same "virus" last week. She denies any fevers, nausea, emesis. Patient was brought in by EMS and noted to be hypoxic at 84%. She was put on 2 L of oxygen improved to 97%. She endorses mild rhinorrhea but denies any sore throat, otalgia, sinus pressure, chest congestion. Patient denies any headache, vision changes, lightheadedness, dizziness, hitting her head, loc, cp, sob, abd pain, urinary symptoms, change in bowel habits, numbness/tinlging, history of cancer, hx of ivdu. Denies any hormone replacement therapy, recent hospitalization/surgeries, prolonged immobilization, hx of dvt, unilateral leg swelling, calf tenderness.   The history is provided by the patient and the spouse.  Back Pain   Pertinent negatives include no chest pain, no fever, no numbness, no headaches, no abdominal pain, no dysuria and no weakness.  Cough  Associated symptoms include wheezing. Pertinent negatives include no  chest pain, no chills, no ear pain, no headaches, no sore throat and no shortness of breath.    Past Medical History:  Diagnosis Date  . Allergic rhinitis due to pollen   . Anxiety state, unspecified   . Calculus of kidney   . Cataract   . Diaphragmatic hernia without mention of obstruction or gangrene   . Diffuse cystic mastopathy   . Dizziness and giddiness   . GERD (gastroesophageal reflux disease)   . Hiatal hernia   . Insomnia, unspecified   . Insomnia, unspecified   . Lumbago   . Macular degeneration (senile) of retina, unspecified   . Open wound of toe(s), without mention of complication   . Osteoarthrosis, unspecified whether generalized or localized, unspecified site   . Other and unspecified hyperlipidemia   . Personal history of fall   . Sciatica   . Sebaceous cyst   . Senile osteoporosis   . Unspecified constipation   . Unspecified essential hypertension   . Unspecified hereditary and idiopathic peripheral neuropathy   . Unspecified hypothyroidism   . Unspecified tinnitus     Patient Active Problem List   Diagnosis Date Noted  . Lumbar spondylolysis 04/13/2016  . Bilateral knee pain 12/08/2015  . Abnormality of gait 08/11/2015  . Edema 08/11/2015  . Bunion 08/11/2015  . Quadriceps weakness 08/11/2015  . Anxiety state 03/10/2015  . Palpitations 02/18/2015  . GERD (gastroesophageal reflux disease) 11/05/2014  . Hypothyroidism   . Hyperlipidemia   . Essential hypertension   . Macular degeneration (senile) of retina   . Diaphragmatic hernia   .  Insomnia   . Hereditary and idiopathic peripheral neuropathy   . Low back pain     Past Surgical History:  Procedure Laterality Date  . CATARACT EXTRACTION, BILATERAL    . CHOLECYSTECTOMY  07/25/2007  . TONSILLECTOMY AND ADENOIDECTOMY Bilateral 1943    OB History    No data available       Home Medications    Prior to Admission medications   Medication Sig Start Date End Date Taking? Authorizing  Provider  ALPRAZolam (XANAX) 1 MG tablet TAKE 1 TABLET BY MOUTH UP TO 3 TIMES DAILY AS NEEDED FOR ANXIETY OR REST Patient taking differently: take 1.5 mg by mouth at bed time, may also take 1 mg daily if needed for anxiety or rest 07/04/16  Yes Tiffany L Reed, DO  Bevacizumab (AVASTIN) 100 MG/4ML SOLN Inject monthly in right eye every 6 weeks.   Yes Historical Provider, MD  Cyanocobalamin (VITAMIN B-12 PO) Take 1 tablet by mouth daily.   Yes Historical Provider, MD  dextromethorphan (DELSYM) 30 MG/5ML liquid Take 30 mg by mouth every 12 (twelve) hours as needed for cough.   Yes Historical Provider, MD  dextromethorphan-guaiFENesin (MUCINEX DM) 30-600 MG 12hr tablet Take 1 tablet by mouth every 12 (twelve) hours as needed for cough.   Yes Historical Provider, MD  doxazosin (CARDURA) 4 MG tablet Take 1 tablet (4 mg total) by mouth 2 (two) times daily. 07/03/16  Yes Tiffany L Reed, DO  indapamide (LOZOL) 1.25 MG tablet TAKE 1 TABLET BY MOUTH DAILY 04/11/16  Yes Sharon Seller, NP  ketorolac (ACULAR) 0.5 % ophthalmic solution Place 1 drop into both eyes daily as needed (for eye pain).   Yes Historical Provider, MD  levothyroxine (SYNTHROID, LEVOTHROID) 100 MCG tablet One daily for thyroid supplement Patient taking differently: Take 100 mcg by mouth daily before breakfast.  12/08/15  Yes Kimber Relic, MD  loratadine (CLARITIN) 10 MG tablet Take 10 mg by mouth daily.    Yes Historical Provider, MD  losartan (COZAAR) 50 MG tablet TAKE 1 TABLET (50 MG TOTAL) BY MOUTH DAILY. 05/03/16  Yes Kimber Relic, MD  metoprolol succinate (TOPROL-XL) 100 MG 24 hr tablet TAKE 1 TABLET BY MOUTH EVERY DAY WITH A MEAL TO CONTROL BLOOD PRESSURE 04/19/16  Yes Kimber Relic, MD  Multiple Vitamins-Minerals (CENTRUM SILVER) tablet Take 1 tablet by mouth daily.   Yes Historical Provider, MD  pantoprazole (PROTONIX) 40 MG tablet Take one tablet by mouth in the morning 30 minutes before breakfast for stomach Patient taking  differently: Take 40 mg by mouth daily with breakfast.  09/02/15  Yes Kimber Relic, MD  polyethylene glycol (MIRALAX / Ethelene Hal) packet Take 17 g by mouth daily.    Yes Historical Provider, MD  Propylene Glycol (SYSTANE BALANCE) 0.6 % SOLN Place 1 drop into both eyes 2 (two) times daily.   Yes Historical Provider, MD  Ranibizumab (LUCENTIS) 0.3 MG/0.05ML SOLN Inject into left eye every 5 weeks.   Yes Historical Provider, MD  sertraline (ZOLOFT) 50 MG tablet TAKE 1 TABLET (50 MG TOTAL) BY MOUTH DAILY. TO CALM NERVES 05/03/16  Yes Kimber Relic, MD  temazepam (RESTORIL) 30 MG capsule TAKE ONE CAPSULE BY MOUTH AT BEDTIME AS NEEDED FOR SLEEP Patient taking differently: TAKE ONE CAPSULE BY MOUTH AT BEDTIME 06/23/16  Yes Sharon Seller, NP  tobramycin (TOBREX) 0.3 % ophthalmic solution Place 1 drop into both eyes 2 (two) times daily as needed (for dry eyes).   Yes  Historical Provider, MD    Family History Family History  Problem Relation Age of Onset  . Hypertension Brother   . Fibromyalgia Daughter   . Hypertension Son   . Hypertension Brother   . Hyperlipidemia Brother     Social History Social History  Substance Use Topics  . Smoking status: Never Smoker  . Smokeless tobacco: Never Used  . Alcohol use No     Allergies   Ambien [zolpidem tartrate]; Bactrim [sulfamethoxazole-trimethoprim]; Morphine and related; and Zithromax [azithromycin]   Review of Systems Review of Systems  Constitutional: Negative for chills and fever.  HENT: Negative for ear pain and sore throat.   Eyes: Negative for pain and visual disturbance.  Respiratory: Positive for cough and wheezing. Negative for shortness of breath.   Cardiovascular: Negative for chest pain and palpitations.  Gastrointestinal: Negative for abdominal pain, diarrhea, nausea and vomiting.  Genitourinary: Negative for dysuria and hematuria.  Musculoskeletal: Positive for back pain. Negative for arthralgias, neck pain and neck  stiffness.  Skin: Negative for color change and rash.  Neurological: Negative for dizziness, syncope, weakness, light-headedness, numbness and headaches.  All other systems reviewed and are negative.    Physical Exam Updated Vital Signs BP 188/70   Pulse 64   Temp 97.6 F (36.4 C)   Resp 16   SpO2 98% Comment: 2L  Physical Exam  Constitutional: She is oriented to person, place, and time. She appears well-developed and well-nourished. No distress.  HENT:  Head: Normocephalic and atraumatic.  Mouth/Throat: Oropharynx is clear and moist.  Eyes: Conjunctivae are normal. Right eye exhibits no discharge. Left eye exhibits no discharge. No scleral icterus.  Neck: Normal range of motion. Neck supple. No thyromegaly present.  No midline c c spine tenderness. No deformities or step off noted. Full ROM.  Cardiovascular: Normal rate, regular rhythm, normal heart sounds and intact distal pulses.  Exam reveals no gallop and no friction rub.   No murmur heard. Patient is hypertensvie but has not taken her home dose of bp meds this am.  Pulmonary/Chest: Effort normal and breath sounds normal. No respiratory distress. She has no wheezes.  Patient is noted to be hypoxic at 84% on room air. She was placed back on 2 L with improvement in her saturations of 97%. She has not tachepneic at this time. Course sounds noted in the right upper lobe. All other lung field are CTAB. No wheezing noted.  Patient does have some mild tenderness of the left lateral rib cage. No deformity, step off, or crepitus noted.  Abdominal: Soft. Bowel sounds are normal. She exhibits no distension. There is no tenderness. There is no rebound and no guarding.  Musculoskeletal: Normal range of motion.  No midline T spine or L spine tenderness. She has full ROM. Do deformity or step of noted. She has lidocaine patche applied.  Lymphadenopathy:    She has no cervical adenopathy.  Neurological: She is alert and oriented to person,  place, and time.  Strength 5/5 in all extremities. Sensation intact. Cap refill normal. DP pulses are 2+ bilaterally  Skin: Skin is warm and dry. Capillary refill takes less than 2 seconds.  Nursing note and vitals reviewed.    ED Treatments / Results  Labs (all labs ordered are listed, but only abnormal results are displayed) Labs Reviewed  CBC WITH DIFFERENTIAL/PLATELET - Abnormal; Notable for the following:       Result Value   Platelets 108 (*)    Lymphs Abs 0.6 (*)  All other components within normal limits  COMPREHENSIVE METABOLIC PANEL - Abnormal; Notable for the following:    Sodium 131 (*)    Potassium 2.7 (*)    Chloride 87 (*)    CO2 36 (*)    Glucose, Bld 107 (*)    Calcium 8.3 (*)    Total Protein 6.1 (*)    Albumin 3.4 (*)    ALT 13 (*)    All other components within normal limits  D-DIMER, QUANTITATIVE (NOT AT [facility]) - Abnormal; Notable for the following:    D-Dimer, Quant 0.59 (*)    All other components within normal limits  HEPATIC FUNCTION PANEL - Abnormal; Notable for the following:    Total Protein 5.9 (*)    ALT 13 (*)    Bilirubin, Direct <0.1 (*)    All other components within normal limits  MAGNESIUM - Abnormal; Notable for the following:    Magnesium 1.5 (*)    All other components within normal limits  PHOSPHORUS - Abnormal; Notable for the following:    Phosphorus 2.1 (*)    All other components within normal limits  BASIC METABOLIC PANEL - Abnormal; Notable for the following:    Sodium 132 (*)    Potassium 3.4 (*)    Chloride 91 (*)    Calcium 8.1 (*)    All other components within normal limits  CBC - Abnormal; Notable for the following:    WBC 2.9 (*)    Hemoglobin 11.9 (*)    HCT 35.1 (*)    Platelets 113 (*)    All other components within normal limits  BASIC METABOLIC PANEL  I-STAT TROPOININ, ED    EKG  EKG Interpretation None       Radiology Dg Chest 2 View  Result Date: 08/02/2016 CLINICAL DATA:  As post fall at  home 3 days ago striking a dresser with persistent mid back and left lateral rib pain. Currently hypoxic. EXAM: CHEST  2 VIEW COMPARISON:  Chest x-ray of July 02, 2015 FINDINGS: The patient is rotated on the current study. The lungs are mildly hyperinflated. There is no focal infiltrate. There is no pleural effusion or pneumothorax. The cardiac silhouette is mildly enlarged though stable. The pulmonary vascularity is normal. There is calcification in the wall of the thoracic aorta. The thoracic vertebral bodies are preserved in height. The observed ribs are intact. IMPRESSION: Chronic bronchitic changes, stable. Stable mild cardiomegaly without pulmonary edema. No acute cardiopulmonary abnormality. Thoracic aortic atherosclerosis. Electronically Signed   By: David  [place] M.D.   On: 08/02/2016 10:20   Dg Ribs Unilateral Left  Result Date: 08/02/2016 CLINICAL DATA:  Status post fall against a dresser 3 days ago with persistent left lateral ribcage discomfort. EXAM: LEFT RIBS - 2 VIEW COMPARISON:  Chest x-ray of today's date FINDINGS: The left rib detail images exhibit no acute fracture. There is no pleural effusion or pneumothorax. IMPRESSION: There is no acute rib fracture observed on today's study. Electronically Signed   By: David  [place] M.D.   On: 08/02/2016 10:22   Dg Cervical Spine Complete  Result Date: 08/02/2016 CLINICAL DATA:  Fall. EXAM: CERVICAL SPINE - COMPLETE 4+ VIEW COMPARISON:  No prior. FINDINGS: Diffuse multilevel degenerative change. C5-C6 4 mm anterolisthesis. No evidence of fracture or dislocation. No acute abnormality. Tiny calcified pulmonary nodule right apex consistent with granuloma . IMPRESSION: Diffuse multilevel degenerative change. C5-C6 4 mm anterolisthesis. No evidence of fracture or dislocation . Electronically Signed   By: Maisie Fus  Register   On: 08/02/2016 10:23   Dg Thoracic Spine 2 View  Result Date: 08/02/2016 CLINICAL DATA:  Fall.  Pain. EXAM: THORACIC SPINE  2 VIEWS COMPARISON:  08/02/2016. FINDINGS: Thoracolumbar scoliosis and diffuse degenerative change. No acute bony abnormality identified. No evidence of fracture . IMPRESSION: Thoracolumbar spine scoliosis and diffuse degenerative change. No acute bony abnormality identified. Electronically Signed   By: Maisie Fus  Register   On: 08/02/2016 10:21   Ct Angio Chest Pe W/cm &/or Wo Cm  Result Date: 08/02/2016 CLINICAL DATA:  Shortness of breath, elevated D-dimer level. EXAM: CT ANGIOGRAPHY CHEST WITH CONTRAST TECHNIQUE: Multidetector CT imaging of the chest was performed using the standard protocol during bolus administration of intravenous contrast. Multiplanar CT image reconstructions and MIPs were obtained to evaluate the vascular anatomy. CONTRAST:  75 mL of Isovue 370 intravenously. COMPARISON:  Chest radiograph same day. FINDINGS: Cardiovascular: Atherosclerosis of thoracic aorta is noted without aneurysm formation. There is no definite evidence of pulmonary embolus. Coronary artery calcifications are noted. Mediastinum/Nodes: No enlarged mediastinal, hilar, or axillary lymph nodes. Thyroid gland, trachea, and esophagus demonstrate no significant findings. Lungs/Pleura: No pneumothorax or pleural effusion is noted. Mild bibasilar posterior subsegmental atelectasis is noted. Upper Abdomen: No acute abnormality. Musculoskeletal: No chest wall abnormality. No acute or significant osseous findings. Review of the MIP images confirms the above findings. IMPRESSION: Aortic atherosclerosis. No definite evidence of pulmonary embolus. Coronary artery calcifications are noted suggesting coronary artery disease. Electronically Signed   By: Lupita Raider, M.D.   On: 08/02/2016 12:57    Procedures Procedures (including critical care time)  Medications Ordered in ED Medications - No data to display   Initial Impression / Assessment and Plan / ED Course  I have reviewed the triage vital signs and the nursing  notes.  Pertinent labs & imaging results that were available during my care of the patient were reviewed by me and considered in my medical decision making (see chart for details).  Clinical Course   Patient presents to the ED with cough, sob, and back pain from fall 3 days ago. Patient has been seen by UC and evaluated for fall without any acute fractures. She was placed on lidocaine patches. Xray today without any signs of fracture. Patient does have wet sounding cough. Lung sounds diminished but without any wheezes or rhonchi. Patient has been hypoxic on RA at 84%. Chest xray shows bronchitis changes without any infiltrate. Potassium was noted to be 2.7. Will replace k. No leukocytosis. Patient is afebrile and not tachycardic. EKG without any acute changes. Troponin was negative. Patient continued to be hypoxic on RA. D dimer was mildly elevated however after age corrected it was normal. But given patient continued hypoxic CTA was ordered that showed no signs of PE or infiltrate. She was given duoneb. Patient was ambulated and sats remained in the low 80s. Consulted hospital medicine for continued hypoxia. Spoke with Dr. Gwenlyn Perking who agrees to admit patient. Patient is hemodynamically stable at this time and in NAD. PAtient was seen and examined by Dr. Jacqulyn Bath who is agreeable to the above plan.  Final Clinical Impressions(s) / ED Diagnoses   Final diagnoses:  Fall  Cough  Hypoxia    New Prescriptions Current Discharge Medication List       Wallace Keller 08/03/16 2154    Maia Plan, MD 08/04/16 1357

## 2016-08-02 NOTE — ED Notes (Signed)
Patient transported to X-ray 

## 2016-08-03 ENCOUNTER — Ambulatory Visit: Payer: Medicare Other | Admitting: Internal Medicine

## 2016-08-03 DIAGNOSIS — G8929 Other chronic pain: Secondary | ICD-10-CM | POA: Diagnosis present

## 2016-08-03 DIAGNOSIS — J209 Acute bronchitis, unspecified: Secondary | ICD-10-CM | POA: Diagnosis present

## 2016-08-03 DIAGNOSIS — E785 Hyperlipidemia, unspecified: Secondary | ICD-10-CM | POA: Diagnosis present

## 2016-08-03 DIAGNOSIS — R0682 Tachypnea, not elsewhere classified: Secondary | ICD-10-CM | POA: Diagnosis present

## 2016-08-03 DIAGNOSIS — E860 Dehydration: Secondary | ICD-10-CM | POA: Diagnosis present

## 2016-08-03 DIAGNOSIS — K219 Gastro-esophageal reflux disease without esophagitis: Secondary | ICD-10-CM | POA: Diagnosis present

## 2016-08-03 DIAGNOSIS — Z8249 Family history of ischemic heart disease and other diseases of the circulatory system: Secondary | ICD-10-CM | POA: Diagnosis not present

## 2016-08-03 DIAGNOSIS — Z888 Allergy status to other drugs, medicaments and biological substances status: Secondary | ICD-10-CM | POA: Diagnosis not present

## 2016-08-03 DIAGNOSIS — Z79899 Other long term (current) drug therapy: Secondary | ICD-10-CM | POA: Diagnosis not present

## 2016-08-03 DIAGNOSIS — E876 Hypokalemia: Secondary | ICD-10-CM | POA: Diagnosis present

## 2016-08-03 DIAGNOSIS — R0902 Hypoxemia: Secondary | ICD-10-CM

## 2016-08-03 DIAGNOSIS — E039 Hypothyroidism, unspecified: Secondary | ICD-10-CM | POA: Diagnosis present

## 2016-08-03 DIAGNOSIS — H3530 Unspecified macular degeneration: Secondary | ICD-10-CM | POA: Diagnosis present

## 2016-08-03 DIAGNOSIS — I10 Essential (primary) hypertension: Secondary | ICD-10-CM | POA: Diagnosis present

## 2016-08-03 DIAGNOSIS — Z881 Allergy status to other antibiotic agents status: Secondary | ICD-10-CM | POA: Diagnosis not present

## 2016-08-03 DIAGNOSIS — R05 Cough: Secondary | ICD-10-CM | POA: Diagnosis not present

## 2016-08-03 DIAGNOSIS — M545 Low back pain: Secondary | ICD-10-CM | POA: Diagnosis present

## 2016-08-03 LAB — CBC
HCT: 35.1 % — ABNORMAL LOW (ref 36.0–46.0)
Hemoglobin: 11.9 g/dL — ABNORMAL LOW (ref 12.0–15.0)
MCH: 28.9 pg (ref 26.0–34.0)
MCHC: 33.9 g/dL (ref 30.0–36.0)
MCV: 85.2 fL (ref 78.0–100.0)
PLATELETS: 113 10*3/uL — AB (ref 150–400)
RBC: 4.12 MIL/uL (ref 3.87–5.11)
RDW: 13.4 % (ref 11.5–15.5)
WBC: 2.9 10*3/uL — AB (ref 4.0–10.5)

## 2016-08-03 LAB — BASIC METABOLIC PANEL
Anion gap: 9 (ref 5–15)
BUN: 7 mg/dL (ref 6–20)
CO2: 32 mmol/L (ref 22–32)
Calcium: 8.1 mg/dL — ABNORMAL LOW (ref 8.9–10.3)
Chloride: 91 mmol/L — ABNORMAL LOW (ref 101–111)
Creatinine, Ser: 0.53 mg/dL (ref 0.44–1.00)
GFR calc Af Amer: >60 mL/min (ref >60)
GFR calc non Af Amer: >60 mL/min (ref >60)
Glucose, Bld: 92 mg/dL (ref 65–99)
Potassium: 3.4 mmol/L — ABNORMAL LOW (ref 3.5–5.1)
Sodium: 132 mmol/L — ABNORMAL LOW (ref 135–145)

## 2016-08-03 MED ORDER — MENTHOL 3 MG MT LOZG
1.0000 | LOZENGE | OROMUCOSAL | Status: DC | PRN
  Filled 2016-08-03: qty 9

## 2016-08-03 MED ORDER — POTASSIUM CHLORIDE CRYS ER 20 MEQ PO TBCR
20.0000 meq | EXTENDED_RELEASE_TABLET | Freq: Once | ORAL | Status: AC
  Administered 2016-08-03: 20 meq via ORAL
  Filled 2016-08-03: qty 1

## 2016-08-03 MED ORDER — ALBUTEROL SULFATE (2.5 MG/3ML) 0.083% IN NEBU: 2.5000 mg | INHALATION_SOLUTION | RESPIRATORY_TRACT | Status: DC | PRN

## 2016-08-03 MED ORDER — ACETAMINOPHEN 325 MG PO TABS
650.0000 mg | ORAL_TABLET | ORAL | Status: DC | PRN
  Administered 2016-08-03 – 2016-08-04 (×4): 650 mg via ORAL
  Filled 2016-08-03 (×4): qty 2

## 2016-08-03 MED ORDER — BENZONATATE 100 MG PO CAPS
100.0000 mg | ORAL_CAPSULE | Freq: Three times a day (TID) | ORAL | Status: DC | PRN
  Administered 2016-08-03 (×3): 100 mg via ORAL
  Filled 2016-08-03 (×3): qty 1

## 2016-08-03 NOTE — Progress Notes (Signed)
PROGRESS NOTE    [name]  ZOX:[id number] DOB: 1930-12-15 DOA: 08/02/2016 PCP: Murray HodgkinsArthur Green, MD    Brief Narrative: 82 y.o. female with PMH significant for allergic rhinitis, anxiety, GERD, Macular degeneration, hypothyroidism and HTN; who presented to ED secondary to productive cough, SOB and general malaise. Patient reports symptoms present for the last 48 hours and worsening   Assessment & Plan:   Principal Problem:   Hypoxia/Bronchitis - Will wean off of supplemental oxygen. Secondary to bronchitis/RAD - continue current medication regimen  Active Problems:   Hypothyroidism - continue synthroid    Hyperlipidemia - Stable no chest pain reported    Essential hypertension - Stable patient currently on metoprolol, losartan    Macular degeneration (senile) of retina    GERD (gastroesophageal reflux disease) - Stable on PPI, Protonix    Hypokalemia - Mild hypokalemia we'll reassess next a.m.   DVT prophylaxis: Heparin Code Status: Full Family Communication: none Disposition Plan: wean to room air then d/c   Consultants:   None   Procedures: None   Antimicrobials: Doxycycline   Subjective: Pt has no new complaints no acute issues overnight.  Objective: Vitals:   08/02/16 2058 08/03/16 0530 08/03/16 1043 08/03/16 1430  BP: (!) 193/69 (!) 161/67 (!) 161/60 (!) 147/73  Pulse: 64 63 69 67  Resp: 17 18  18   Temp: 98 F (36.7 C) 98 F (36.7 C)  98.2 F (36.8 C)  TempSrc: Oral Oral  Oral  SpO2: 98% 98%  97%  Weight:      Height:        Intake/Output Summary (Last 24 hours) at 08/03/16 1514 Last data filed at 08/02/16 2114  Gross per 24 hour  Intake              460 ml  Output                0 ml  Net              460 ml   Filed Weights   08/02/16 1302 08/02/16 1741  Weight: 59.9 kg (132 lb) 61.4 kg (135 lb 4.8 oz)    Examination:  General exam: Appears calm and comfortable, in nad.  Respiratory system: equal chest rise, no wheezes,  rhales Cardiovascular system: S1 & S2 heard, RRR. No JVD, murmurs, rubs, gallops or clicks. No pedal edema. Gastrointestinal system: Abdomen is nondistended, soft and nontender. No organomegaly or masses felt. Normal bowel sounds heard. Central nervous system: Alert and oriented. No focal neurological deficits. Extremities: Symmetric 5 x 5 power. Skin: No rashes, lesions or ulcers, on limited exam. Psychiatry: Judgement and insight appear normal. Mood & affect appropriate.   Data Reviewed: I have personally reviewed following labs and imaging studies  CBC:  Recent Labs Lab 08/02/16 1016 08/03/16 0520  WBC 4.6 2.9*  NEUTROABS 3.3  --   HGB 13.3 11.9*  HCT 37.3 35.1*  MCV 81.1 85.2  PLT 108* 113*   Basic Metabolic Panel:  Recent Labs Lab 08/02/16 1047 08/02/16 1759 08/03/16 0520  NA 131*  --  132*  K 2.7*  --  3.4*  CL 87*  --  91*  CO2 36*  --  32  GLUCOSE 107*  --  92  BUN 11  --  7  CREATININE 0.74  --  0.53  CALCIUM 8.3*  --  8.1*  MG  --  1.5*  --   PHOS  --  2.1*  --    GFR:  Estimated Creatinine Clearance: 44.1 mL/min (by C-G formula based on SCr of 0.53 mg/dL). Liver Function Tests:  Recent Labs Lab 08/02/16 1047 08/02/16 1759  AST 19 21  ALT 13* 13*  ALKPHOS 72 68  BILITOT 0.6 0.6  PROT 6.1* 5.9*  ALBUMIN 3.4* 3.5   No results for input(s): LIPASE, AMYLASE in the last 168 hours. No results for input(s): AMMONIA in the last 168 hours. Coagulation Profile: No results for input(s): INR, PROTIME in the last 168 hours. Cardiac Enzymes: No results for input(s): CKTOTAL, CKMB, CKMBINDEX, TROPONINI in the last 168 hours. BNP (last 3 results) No results for input(s): PROBNP in the last 8760 hours. HbA1C: No results for input(s): HGBA1C in the last 72 hours. CBG: No results for input(s): GLUCAP in the last 168 hours. Lipid Profile: No results for input(s): CHOL, HDL, LDLCALC, TRIG, CHOLHDL, LDLDIRECT in the last 72 hours. Thyroid Function Tests: No  results for input(s): TSH, T4TOTAL, FREET4, T3FREE, THYROIDAB in the last 72 hours. Anemia Panel: No results for input(s): VITAMINB12, FOLATE, FERRITIN, TIBC, IRON, RETICCTPCT in the last 72 hours. Sepsis Labs: No results for input(s): PROCALCITON, LATICACIDVEN in the last 168 hours.  No results found for this or any previous visit (from the past 240 hour(s)).       Radiology Studies: Dg Chest 2 View  Result Date: 08/02/2016 CLINICAL DATA:  As post fall at home 3 days ago striking a dresser with persistent mid back and left lateral rib pain. Currently hypoxic. EXAM: CHEST  2 VIEW COMPARISON:  Chest x-ray of July 02, 2015 FINDINGS: The patient is rotated on the current study. The lungs are mildly hyperinflated. There is no focal infiltrate. There is no pleural effusion or pneumothorax. The cardiac silhouette is mildly enlarged though stable. The pulmonary vascularity is normal. There is calcification in the wall of the thoracic aorta. The thoracic vertebral bodies are preserved in height. The observed ribs are intact. IMPRESSION: Chronic bronchitic changes, stable. Stable mild cardiomegaly without pulmonary edema. No acute cardiopulmonary abnormality. Thoracic aortic atherosclerosis. Electronically Signed   By: David  [place] M.D.   On: 08/02/2016 10:20   Dg Ribs Unilateral Left  Result Date: 08/02/2016 CLINICAL DATA:  Status post fall against a dresser 3 days ago with persistent left lateral ribcage discomfort. EXAM: LEFT RIBS - 2 VIEW COMPARISON:  Chest x-ray of today's date FINDINGS: The left rib detail images exhibit no acute fracture. There is no pleural effusion or pneumothorax. IMPRESSION: There is no acute rib fracture observed on today's study. Electronically Signed   By: David  [place] M.D.   On: 08/02/2016 10:22   Dg Cervical Spine Complete  Result Date: 08/02/2016 CLINICAL DATA:  Fall. EXAM: CERVICAL SPINE - COMPLETE 4+ VIEW COMPARISON:  No prior. FINDINGS: Diffuse multilevel  degenerative change. C5-C6 4 mm anterolisthesis. No evidence of fracture or dislocation. No acute abnormality. Tiny calcified pulmonary nodule right apex consistent with granuloma . IMPRESSION: Diffuse multilevel degenerative change. C5-C6 4 mm anterolisthesis. No evidence of fracture or dislocation . Electronically Signed   By: Maisie Fus  Register   On: 08/02/2016 10:23   Dg Thoracic Spine 2 View  Result Date: 08/02/2016 CLINICAL DATA:  Fall.  Pain. EXAM: THORACIC SPINE 2 VIEWS COMPARISON:  08/02/2016. FINDINGS: Thoracolumbar scoliosis and diffuse degenerative change. No acute bony abnormality identified. No evidence of fracture . IMPRESSION: Thoracolumbar spine scoliosis and diffuse degenerative change. No acute bony abnormality identified. Electronically Signed   By: Maisie Fus  Register   On: 08/02/2016 10:21  Ct Angio Chest Pe W/cm &/or Wo Cm  Result Date: 08/02/2016 CLINICAL DATA:  Shortness of breath, elevated D-dimer level. EXAM: CT ANGIOGRAPHY CHEST WITH CONTRAST TECHNIQUE: Multidetector CT imaging of the chest was performed using the standard protocol during bolus administration of intravenous contrast. Multiplanar CT image reconstructions and MIPs were obtained to evaluate the vascular anatomy. CONTRAST:  75 mL of Isovue 370 intravenously. COMPARISON:  Chest radiograph same day. FINDINGS: Cardiovascular: Atherosclerosis of thoracic aorta is noted without aneurysm formation. There is no definite evidence of pulmonary embolus. Coronary artery calcifications are noted. Mediastinum/Nodes: No enlarged mediastinal, hilar, or axillary lymph nodes. Thyroid gland, trachea, and esophagus demonstrate no significant findings. Lungs/Pleura: No pneumothorax or pleural effusion is noted. Mild bibasilar posterior subsegmental atelectasis is noted. Upper Abdomen: No acute abnormality. Musculoskeletal: No chest wall abnormality. No acute or significant osseous findings. Review of the MIP images confirms the above  findings. IMPRESSION: Aortic atherosclerosis. No definite evidence of pulmonary embolus. Coronary artery calcifications are noted suggesting coronary artery disease. Electronically Signed   By: Lupita RaiderJames  Green Jr, M.D.   On: 08/02/2016 12:57        Scheduled Meds: . doxazosin  4 mg Oral BID  . doxycycline  100 mg Oral Q12H  . fluticasone  1 spray Each Nare Daily  . heparin  5,000 Units Subcutaneous Q8H  . indapamide  1.25 mg Oral Daily  . levothyroxine  100 mcg Oral QAC breakfast  . loratadine  10 mg Oral Daily  . losartan  50 mg Oral Daily  . metoprolol succinate  100 mg Oral Daily  . multivitamin with minerals  1 tablet Oral Daily  . pantoprazole  40 mg Oral Daily  . polyethylene glycol  17 g Oral Daily  . polyvinyl alcohol  1 drop Both Eyes BID  . sertraline  50 mg Oral Daily  . sodium chloride flush  3 mL Intravenous Q12H   Continuous Infusions:   LOS: 0 days    Time spent: > 35 minutes  Penny PiaVEGA, Elycia Woodside, MD [facility] Pager [phone]  If 7PM-7AM, please contact night-coverage [email or web address] Password [email or web address] 08/03/2016, 3:14 PM

## 2016-08-03 NOTE — Plan of Care (Signed)
Problem: Safety: Goal: Ability to remain free from injury will improve Continue.    Problem: Health Behavior/Discharge Planning: Goal: Ability to manage health-related needs will improve Continue.    Problem: Pain Managment: Goal: General experience of comfort will improve Outcome: Progressing C/o generalized pain and back pain.  Tylenol given around the clock.  Effective.

## 2016-08-03 NOTE — Progress Notes (Signed)
Pt weaned to room air with SpO2 in 90's, then desatted at 1630 to upper 80's, put back on 1L [place].  Will continue to monitor and wean as appropriate.

## 2016-08-04 LAB — BASIC METABOLIC PANEL
Anion gap: 6 (ref 5–15)
BUN: 7 mg/dL (ref 6–20)
CALCIUM: 8.3 mg/dL — AB (ref 8.9–10.3)
CO2: 36 mmol/L — AB (ref 22–32)
Chloride: 87 mmol/L — ABNORMAL LOW (ref 101–111)
Creatinine, Ser: 0.61 mg/dL (ref 0.44–1.00)
GFR calc Af Amer: >60 mL/min (ref >60)
GLUCOSE: 99 mg/dL (ref 65–99)
Potassium: 3.1 mmol/L — ABNORMAL LOW (ref 3.5–5.1)
Sodium: 129 mmol/L — ABNORMAL LOW (ref 135–145)

## 2016-08-04 MED ORDER — POTASSIUM CHLORIDE CRYS ER 20 MEQ PO TBCR
40.0000 meq | EXTENDED_RELEASE_TABLET | Freq: Once | ORAL | Status: AC
  Administered 2016-08-04: 40 meq via ORAL
  Filled 2016-08-04: qty 2

## 2016-08-04 MED ORDER — DOXYCYCLINE HYCLATE 100 MG PO TABS: 100.0000 mg | ORAL_TABLET | Freq: Two times a day (BID) | ORAL | 0 refills | Status: AC

## 2016-08-04 MED ORDER — BENZONATATE 100 MG PO CAPS: 100.0000 mg | ORAL_CAPSULE | Freq: Three times a day (TID) | ORAL | 0 refills | Status: DC | PRN

## 2016-08-04 NOTE — Discharge Summary (Signed)
Physician Discharge Summary  [name] ZOX:[id number] DOB: [birth year] DOA: 08/02/2016  PCP: Murray HodgkinsArthur Green, MD  Admit date: 08/02/2016 Discharge date: 08/04/2016  Time spent: > 35 minutes  Recommendations for Outpatient Follow-up:  1. Monitor K levels  Discharge Diagnoses:  Principal Problem:   Hypoxia Active Problems:   Hypothyroidism   Hyperlipidemia   Essential hypertension   Macular degeneration (senile) of retina   GERD (gastroesophageal reflux disease)   Hypokalemia   Bronchitis   Discharge Condition: Stable  Diet recommendation: Heart healthy   Filed Weights   08/02/16 1302 08/02/16 1741  Weight: 59.9 kg (132 lb) 61.4 kg (135 lb 4.8 oz)    History of present illness:   82 y.o. female with PMH significant for allergic rhinitis, anxiety, GERD, Macular degeneration, hypothyroidism and HTN; who presented to ED secondary to productive cough, SOB and general malaise.   Hospital Course:   Principal Problem:   Hypoxia/Bronchitis -  Secondary to bronchitis/RAD - resolved patient breathing comfortably on room air.  Active Problems:   Hypothyroidism - continue synthroid    Hyperlipidemia - Stable no chest pain reported    Essential hypertension - Stable patient to continue prior to admission medication regimen    Macular degeneration (senile) of retina    GERD (gastroesophageal reflux disease) - stable continue prior to admission medication regimen.    Hypokalemia - replaced prior to discharge   Procedures:  None  Consultations:  None  Discharge Exam: Vitals:   08/04/16 0343 08/04/16 0913  BP: (!) 173/81 (!) 165/80  Pulse: 74 70  Resp: 14   Temp: 98.6 F (37 C)     General: Pt in nad. Alert and awake Cardiovascular: rrr, no rubs Respiratory: no increase wob, no wheezes, equal chest rise.  Discharge Instructions   Discharge Instructions    Call MD for:  severe uncontrolled pain    Complete by:  As directed    Call MD for:   temperature >100.4    Complete by:  As directed    Diet - low sodium heart healthy    Complete by:  As directed    Discharge instructions    Complete by:  As directed    Please discuss with your ophthalmologist if they want to continue your home medication regimen while you are on antibiotics. Follow up with your primary care physician within the next 1-2 weeks or sooner should any new concerns arise.   Increase activity slowly    Complete by:  As directed      Current Discharge Medication List    START taking these medications   Details  benzonatate (TESSALON) 100 MG capsule Take 1 capsule (100 mg total) by mouth 3 (three) times daily as needed for cough. Qty: 20 capsule, Refills: 0    doxycycline (VIBRA-TABS) 100 MG tablet Take 1 tablet (100 mg total) by mouth every 12 (twelve) hours. Qty: 10 tablet, Refills: 0      CONTINUE these medications which have NOT CHANGED   Details  ALPRAZolam (XANAX) 1 MG tablet TAKE 1 TABLET BY MOUTH UP TO 3 TIMES DAILY AS NEEDED FOR ANXIETY OR REST Qty: 90 tablet, Refills: 0    Bevacizumab (AVASTIN) 100 MG/4ML SOLN Inject monthly in right eye every 6 weeks.    Cyanocobalamin (VITAMIN B-12 PO) Take 1 tablet by mouth daily.    dextromethorphan (DELSYM) 30 MG/5ML liquid Take 30 mg by mouth every 12 (twelve) hours as needed for cough.    doxazosin (CARDURA) 4 MG tablet  Take 1 tablet (4 mg total) by mouth 2 (two) times daily. Qty: 90 tablet, Refills: 3    indapamide (LOZOL) 1.25 MG tablet TAKE 1 TABLET BY MOUTH DAILY Qty: 90 tablet, Refills: 1    ketorolac (ACULAR) 0.5 % ophthalmic solution Place 1 drop into both eyes daily as needed (for eye pain).    levothyroxine (SYNTHROID, LEVOTHROID) 100 MCG tablet One daily for thyroid supplement Qty: 90 tablet, Refills: 4    loratadine (CLARITIN) 10 MG tablet Take 10 mg by mouth daily.     losartan (COZAAR) 50 MG tablet TAKE 1 TABLET (50 MG TOTAL) BY MOUTH DAILY. Qty: 90 tablet, Refills: 3    Associated Diagnoses: Essential hypertension    metoprolol succinate (TOPROL-XL) 100 MG 24 hr tablet TAKE 1 TABLET BY MOUTH EVERY DAY WITH A MEAL TO CONTROL BLOOD PRESSURE Qty: 90 tablet, Refills: 1   Associated Diagnoses: Essential hypertension    Multiple Vitamins-Minerals (CENTRUM SILVER) tablet Take 1 tablet by mouth daily.    pantoprazole (PROTONIX) 40 MG tablet Take one tablet by mouth in the morning 30 minutes before breakfast for stomach Qty: 90 tablet, Refills: 3    polyethylene glycol (MIRALAX / GLYCOLAX) packet Take 17 g by mouth daily.     Propylene Glycol (SYSTANE BALANCE) 0.6 % SOLN Place 1 drop into both eyes 2 (two) times daily.    Ranibizumab (LUCENTIS) 0.3 MG/0.05ML SOLN Inject into left eye every 5 weeks.    sertraline (ZOLOFT) 50 MG tablet TAKE 1 TABLET (50 MG TOTAL) BY MOUTH DAILY. TO CALM NERVES Qty: 90 tablet, Refills: 3    tobramycin (TOBREX) 0.3 % ophthalmic solution Place 1 drop into both eyes 2 (two) times daily as needed (for dry eyes).      STOP taking these medications     dextromethorphan-guaiFENesin (MUCINEX DM) 30-600 MG 12hr tablet      temazepam (RESTORIL) 30 MG capsule        Allergies  Allergen Reactions  . Ambien [Zolpidem Tartrate]     hallucination  . Bactrim [Sulfamethoxazole-Trimethoprim]     Unknown reaction   . Morphine And Related Nausea And Vomiting  . Zithromax [Azithromycin]     Not effective        The results of significant diagnostics from this hospitalization (including imaging, microbiology, ancillary and laboratory) are listed below for reference.    Significant Diagnostic Studies: Dg Chest 2 View  Result Date: 08/02/2016 CLINICAL DATA:  As post fall at home 3 days ago striking a dresser with persistent mid back and left lateral rib pain. Currently hypoxic. EXAM: CHEST  2 VIEW COMPARISON:  Chest x-ray of July 02, 2015 FINDINGS: The patient is rotated on the current study. The lungs are mildly hyperinflated.  There is no focal infiltrate. There is no pleural effusion or pneumothorax. The cardiac silhouette is mildly enlarged though stable. The pulmonary vascularity is normal. There is calcification in the wall of the thoracic aorta. The thoracic vertebral bodies are preserved in height. The observed ribs are intact. IMPRESSION: Chronic bronchitic changes, stable. Stable mild cardiomegaly without pulmonary edema. No acute cardiopulmonary abnormality. Thoracic aortic atherosclerosis. Electronically Signed   By: David  [place] M.D.   On: 08/02/2016 10:20   Dg Ribs Unilateral Left  Result Date: 08/02/2016 CLINICAL DATA:  Status post fall against a dresser 3 days ago with persistent left lateral ribcage discomfort. EXAM: LEFT RIBS - 2 VIEW COMPARISON:  Chest x-ray of today's date FINDINGS: The left rib detail images exhibit no acute  fracture. There is no pleural effusion or pneumothorax. IMPRESSION: There is no acute rib fracture observed on today's study. Electronically Signed   By: David  [place] M.D.   On: 08/02/2016 10:22   Dg Cervical Spine Complete  Result Date: 08/02/2016 CLINICAL DATA:  Fall. EXAM: CERVICAL SPINE - COMPLETE 4+ VIEW COMPARISON:  No prior. FINDINGS: Diffuse multilevel degenerative change. C5-C6 4 mm anterolisthesis. No evidence of fracture or dislocation. No acute abnormality. Tiny calcified pulmonary nodule right apex consistent with granuloma . IMPRESSION: Diffuse multilevel degenerative change. C5-C6 4 mm anterolisthesis. No evidence of fracture or dislocation . Electronically Signed   By: Maisie Fushomas  Register   On: 08/02/2016 10:23   Dg Thoracic Spine 2 View  Result Date: 08/02/2016 CLINICAL DATA:  Fall.  Pain. EXAM: THORACIC SPINE 2 VIEWS COMPARISON:  08/02/2016. FINDINGS: Thoracolumbar scoliosis and diffuse degenerative change. No acute bony abnormality identified. No evidence of fracture . IMPRESSION: Thoracolumbar spine scoliosis and diffuse degenerative change. No acute bony  abnormality identified. Electronically Signed   By: Maisie Fushomas  Register   On: 08/02/2016 10:21   Ct Angio Chest Pe W/cm &/or Wo Cm  Result Date: 08/02/2016 CLINICAL DATA:  Shortness of breath, elevated D-dimer level. EXAM: CT ANGIOGRAPHY CHEST WITH CONTRAST TECHNIQUE: Multidetector CT imaging of the chest was performed using the standard protocol during bolus administration of intravenous contrast. Multiplanar CT image reconstructions and MIPs were obtained to evaluate the vascular anatomy. CONTRAST:  75 mL of Isovue 370 intravenously. COMPARISON:  Chest radiograph same day. FINDINGS: Cardiovascular: Atherosclerosis of thoracic aorta is noted without aneurysm formation. There is no definite evidence of pulmonary embolus. Coronary artery calcifications are noted. Mediastinum/Nodes: No enlarged mediastinal, hilar, or axillary lymph nodes. Thyroid gland, trachea, and esophagus demonstrate no significant findings. Lungs/Pleura: No pneumothorax or pleural effusion is noted. Mild bibasilar posterior subsegmental atelectasis is noted. Upper Abdomen: No acute abnormality. Musculoskeletal: No chest wall abnormality. No acute or significant osseous findings. Review of the MIP images confirms the above findings. IMPRESSION: Aortic atherosclerosis. No definite evidence of pulmonary embolus. Coronary artery calcifications are noted suggesting coronary artery disease. Electronically Signed   By: Lupita RaiderJames  Green Jr, M.D.   On: 08/02/2016 12:57    Microbiology: No results found for this or any previous visit (from the past 240 hour(s)).   Labs: Basic Metabolic Panel:  Recent Labs Lab 08/02/16 1047 08/02/16 1759 08/03/16 0520 08/04/16 0451  NA 131*  --  132* 129*  K 2.7*  --  3.4* 3.1*  CL 87*  --  91* 87*  CO2 36*  --  32 36*  GLUCOSE 107*  --  92 99  BUN 11  --  7 7  CREATININE 0.74  --  0.53 0.61  CALCIUM 8.3*  --  8.1* 8.3*  MG  --  1.5*  --   --   PHOS  --  2.1*  --   --    Liver Function  Tests:  Recent Labs Lab 08/02/16 1047 08/02/16 1759  AST 19 21  ALT 13* 13*  ALKPHOS 72 68  BILITOT 0.6 0.6  PROT 6.1* 5.9*  ALBUMIN 3.4* 3.5   No results for input(s): LIPASE, AMYLASE in the last 168 hours. No results for input(s): AMMONIA in the last 168 hours. CBC:  Recent Labs Lab 08/02/16 1016 08/03/16 0520  WBC 4.6 2.9*  NEUTROABS 3.3  --   HGB 13.3 11.9*  HCT 37.3 35.1*  MCV 81.1 85.2  PLT 108* 113*   Cardiac Enzymes: No results  for input(s): CKTOTAL, CKMB, CKMBINDEX, TROPONINI in the last 168 hours. BNP: BNP (last 3 results) No results for input(s): BNP in the last 8760 hours.  ProBNP (last 3 results) No results for input(s): PROBNP in the last 8760 hours.  CBG: No results for input(s): GLUCAP in the last 168 hours.  Signed:  Penny Pia MD.  [facility] 08/04/2016, 12:28 PM

## 2016-08-05 ENCOUNTER — Telehealth: Payer: Self-pay

## 2016-08-05 NOTE — Telephone Encounter (Signed)
Transition Care Management Follow-Up Telephone Call   Date discharged and where: [facility]; 08/04/16  How have you been since you were released from the hospital? Spoke with Pt's son, Clarene CritchleySteve Bugarin. He voices that pt is currently resting and has been resting since being released from the hospital. She has been using her walker in the home. Pt is still fairly sore from her fall (trying to help her husband up off the floor when he fell; she fell into a dresser and hit her back) but it seems to be getting better.   Any patient concerns? Son stated that when pt was discharged, they were told someone from Home Health would be coming by to evaluate pt in the home. They have not heard from anyone as of today (08/05/16).   Items Reviewed:   Meds: Y  Allergies: Y  Dietary Changes Reviewed: Y  Functional Questionnaire:  Independent-I Dependent-D  ADLs:   Dressing- I    Eating- I   Maintaining continence- I   Transferring- I   Transportation- D   Meal Prep- D   Managing Meds- D  Confirmed importance and Date/Time of follow-up visits scheduled: Yes; TOC F/U with Areatha KeasJ. Eubanks on 08/16/2016   Confirmed with patient if condition worsens to call PCP or go to the Emergency Dept. Patient was given office number and encouraged to call back with questions or concerns: YJeannie Fend

## 2016-08-06 DIAGNOSIS — H3530 Unspecified macular degeneration: Secondary | ICD-10-CM | POA: Diagnosis not present

## 2016-08-06 DIAGNOSIS — G609 Hereditary and idiopathic neuropathy, unspecified: Secondary | ICD-10-CM | POA: Diagnosis not present

## 2016-08-06 DIAGNOSIS — J209 Acute bronchitis, unspecified: Secondary | ICD-10-CM | POA: Diagnosis not present

## 2016-08-06 DIAGNOSIS — M1990 Unspecified osteoarthritis, unspecified site: Secondary | ICD-10-CM | POA: Diagnosis not present

## 2016-08-06 DIAGNOSIS — I10 Essential (primary) hypertension: Secondary | ICD-10-CM | POA: Diagnosis not present

## 2016-08-06 DIAGNOSIS — F419 Anxiety disorder, unspecified: Secondary | ICD-10-CM | POA: Diagnosis not present

## 2016-08-06 DIAGNOSIS — M810 Age-related osteoporosis without current pathological fracture: Secondary | ICD-10-CM | POA: Diagnosis not present

## 2016-08-11 DIAGNOSIS — J209 Acute bronchitis, unspecified: Secondary | ICD-10-CM | POA: Diagnosis not present

## 2016-08-11 DIAGNOSIS — H3530 Unspecified macular degeneration: Secondary | ICD-10-CM | POA: Diagnosis not present

## 2016-08-11 DIAGNOSIS — M810 Age-related osteoporosis without current pathological fracture: Secondary | ICD-10-CM | POA: Diagnosis not present

## 2016-08-11 DIAGNOSIS — I10 Essential (primary) hypertension: Secondary | ICD-10-CM | POA: Diagnosis not present

## 2016-08-11 DIAGNOSIS — M1990 Unspecified osteoarthritis, unspecified site: Secondary | ICD-10-CM | POA: Diagnosis not present

## 2016-08-11 DIAGNOSIS — G609 Hereditary and idiopathic neuropathy, unspecified: Secondary | ICD-10-CM | POA: Diagnosis not present

## 2016-08-12 ENCOUNTER — Telehealth: Payer: Self-pay

## 2016-08-12 MED ORDER — ALPRAZOLAM 1 MG PO TABS: ORAL_TABLET | ORAL | 0 refills | Status: DC

## 2016-08-12 NOTE — Telephone Encounter (Signed)
Needs referral on her Xanax, takes one at bedtime up to 3 daily, last 07/04/16. Per Wyatt Portelaebbie Green, NP will only give #[phone]. Faxed Rx to [facility] [place] . Has appt 08/16/16 with Abbey ChattersJessica Eubanks.

## 2016-08-13 DIAGNOSIS — H3530 Unspecified macular degeneration: Secondary | ICD-10-CM | POA: Diagnosis not present

## 2016-08-13 DIAGNOSIS — J209 Acute bronchitis, unspecified: Secondary | ICD-10-CM | POA: Diagnosis not present

## 2016-08-13 DIAGNOSIS — M1990 Unspecified osteoarthritis, unspecified site: Secondary | ICD-10-CM | POA: Diagnosis not present

## 2016-08-13 DIAGNOSIS — G609 Hereditary and idiopathic neuropathy, unspecified: Secondary | ICD-10-CM | POA: Diagnosis not present

## 2016-08-13 DIAGNOSIS — I10 Essential (primary) hypertension: Secondary | ICD-10-CM | POA: Diagnosis not present

## 2016-08-13 DIAGNOSIS — M810 Age-related osteoporosis without current pathological fracture: Secondary | ICD-10-CM | POA: Diagnosis not present

## 2016-08-16 ENCOUNTER — Ambulatory Visit (INDEPENDENT_AMBULATORY_CARE_PROVIDER_SITE_OTHER): Payer: Medicare Other | Admitting: Nurse Practitioner

## 2016-08-16 ENCOUNTER — Encounter: Payer: Self-pay | Admitting: Nurse Practitioner

## 2016-08-16 VITALS — BP 182/74 | HR 62 | Temp 97.3°F | Resp 17 | Ht 63.0 in | Wt 130.8 lb

## 2016-08-16 DIAGNOSIS — E876 Hypokalemia: Secondary | ICD-10-CM | POA: Diagnosis not present

## 2016-08-16 DIAGNOSIS — M545 Low back pain, unspecified: Secondary | ICD-10-CM

## 2016-08-16 DIAGNOSIS — J209 Acute bronchitis, unspecified: Secondary | ICD-10-CM | POA: Diagnosis not present

## 2016-08-16 DIAGNOSIS — I10 Essential (primary) hypertension: Secondary | ICD-10-CM | POA: Diagnosis not present

## 2016-08-16 MED ORDER — LOSARTAN POTASSIUM 100 MG PO TABS: 100.0000 mg | ORAL_TABLET | Freq: Every day | ORAL | 0 refills | Status: DC

## 2016-08-16 MED ORDER — ASPERCREME LIDOCAINE 4 % EX PTCH: 1.0000 | MEDICATED_PATCH | Freq: Two times a day (BID) | CUTANEOUS | 3 refills | Status: DC

## 2016-08-16 NOTE — Progress Notes (Signed)
Careteam: Patient Care Team: Estill Dooms, MD as PCP - General (Internal Medicine) Hurman Horn, MD as Consulting Physician (Ophthalmology) Jarome Matin, MD as Consulting Physician (Dermatology) Lafayette Dragon, MD (Inactive) as Consulting Physician (Gastroenterology)  Advanced Directive information Does Patient Have a Medical Advance Directive?: Yes, Type of Advance Directive: Living will  Allergies  Allergen Reactions  . Ambien [Zolpidem Tartrate]     hallucination  . Bactrim [Sulfamethoxazole-Trimethoprim]     Unknown reaction   . Morphine And Related Nausea And Vomiting  . Zithromax [Azithromycin]     Not effective      Chief Complaint  Patient presents with  . [facility] stay 08/02/16 until 08/04/16 for bronchitis     HPI: Patient is a 83 y.o. female seen in the office today for a follow-up from hospital admission 12/19 - 12/21 due to bronchitis. The patient presented to the hospital with hypoxia which was resolved upon discharge. At discharge she was started on Tessalon 100 mg PRN and Doxycycline 100 mg BID x5 days. Hypokalemia in the hospital - replaced prior to discharge.   Patient reports her bronchitis has resolved. She completed the doxycycline and used Tessalon and breathing treatments as needed. She has continued doing breathing exercises every hour as she reports her O2 sats had continued to be low. She has a home health nurse that came last week - the patients O2 sat was 88% at that time. O2 sat this morning is 95%. Denies any further shortness of breath, cough, or congestion.   BP not controlled. Patient checked BP at the drug store and her sys BP was 189 - patient doubled up on her Cardura to 8 mg. She has continued to take the 4 mg  Twice daily and her BP has remained high. The home health nurse that came last week got a sys BP of 179, the PT that came a different day last week got a sys BP of 180. Denies chest pain, shortness of breath,  lightheadedness, dizziness, or headache. She has had palpitations at times but reports this is not new.   Patient had a fall against the dresser on 12/17 when trying to get her husband off the floor. She reports she "flew through the air into the drawers". She went to urgent care that day and had x-rays completed. She was told nothing was broke and they prescribed her Lidocaine patchs. Since that time the patient has been taking Tylenol 500 mg Q4H, 0.5 mg of Xanax BID, and Xanax 1 mg QHS for back pain. Also uses a heating pad "all day long". Currently working with PT in the home. She reports she can tolerate the pain with her current treatment. She rates her pain a 3/10 now.   Denies leg cramps.   Review of Systems:  Review of Systems  Constitutional: Negative for unexpected weight change.  HENT: Negative for congestion, postnasal drip, rhinorrhea, sinus pain, sinus pressure, sneezing, sore throat and trouble swallowing.   Respiratory: Negative for shortness of breath and wheezing.   Cardiovascular: Positive for palpitations. Negative for chest pain and leg swelling.  Gastrointestinal: Positive for constipation. Negative for abdominal pain, diarrhea (chronic controlled with Miralax), nausea and vomiting.  Musculoskeletal: Positive for back pain. Negative for arthralgias, joint swelling and myalgias.  Neurological: Negative for dizziness, weakness, light-headedness and headaches.    Past Medical History:  Diagnosis Date  . Allergic rhinitis due to pollen   . Anxiety state,  unspecified   . Calculus of kidney   . Cataract   . Diaphragmatic hernia without mention of obstruction or gangrene   . Diffuse cystic mastopathy   . Dizziness and giddiness   . GERD (gastroesophageal reflux disease)   . Hiatal hernia   . Insomnia, unspecified   . Insomnia, unspecified   . Lumbago   . Macular degeneration (senile) of retina, unspecified   . Open wound of toe(s), without mention of complication   .  Osteoarthrosis, unspecified whether generalized or localized, unspecified site   . Other and unspecified hyperlipidemia   . Personal history of fall   . Sciatica   . Sebaceous cyst   . Senile osteoporosis   . Unspecified constipation   . Unspecified essential hypertension   . Unspecified hereditary and idiopathic peripheral neuropathy   . Unspecified hypothyroidism   . Unspecified tinnitus    Past Surgical History:  Procedure Laterality Date  . CATARACT EXTRACTION, BILATERAL    . CHOLECYSTECTOMY  07/25/2007  . TONSILLECTOMY AND ADENOIDECTOMY Bilateral 1943   Social History:   reports that she has never smoked. She has never used smokeless tobacco. She reports that she does not drink alcohol or use drugs.  Family History  Problem Relation Age of Onset  . Hypertension Brother   . Fibromyalgia Daughter   . Hypertension Son   . Hypertension Brother   . Hyperlipidemia Brother     Medications: Patient's Medications  New Prescriptions   No medications on file  Previous Medications   ALPRAZOLAM (XANAX) 1 MG TABLET    Take one tablet up to 3 tablets daily for anxiety or rest   ASPERCREME LIDOCAINE 4 % PTCH    APPLY ONE PATCH TWICE DAILY AS NEEDED   BENZONATATE (TESSALON) 100 MG CAPSULE    Take 1 capsule (100 mg total) by mouth 3 (three) times daily as needed for cough.   BEVACIZUMAB (AVASTIN) 100 MG/4ML SOLN    Inject monthly in right eye every 6 weeks.   CYANOCOBALAMIN (VITAMIN B-12 PO)    Take 1 tablet by mouth daily.   DEXTROMETHORPHAN (DELSYM) 30 MG/5ML LIQUID    Take 30 mg by mouth every 12 (twelve) hours as needed for cough.   DOXAZOSIN (CARDURA) 4 MG TABLET    Take 1 tablet (4 mg total) by mouth 2 (two) times daily.   INDAPAMIDE (LOZOL) 1.25 MG TABLET    TAKE 1 TABLET BY MOUTH DAILY   KETOROLAC (ACULAR) 0.5 % OPHTHALMIC SOLUTION    Place 1 drop into both eyes daily as needed (for eye pain).   LEVOTHYROXINE (SYNTHROID, LEVOTHROID) 100 MCG TABLET    One daily for thyroid  supplement   LORATADINE (CLARITIN) 10 MG TABLET    Take 10 mg by mouth daily.    LOSARTAN (COZAAR) 50 MG TABLET    TAKE 1 TABLET (50 MG TOTAL) BY MOUTH DAILY.   METOPROLOL SUCCINATE (TOPROL-XL) 100 MG 24 HR TABLET    TAKE 1 TABLET BY MOUTH EVERY DAY WITH A MEAL TO CONTROL BLOOD PRESSURE   MULTIPLE VITAMINS-MINERALS (CENTRUM SILVER) TABLET    Take 1 tablet by mouth daily.   PANTOPRAZOLE (PROTONIX) 40 MG TABLET    Take one tablet by mouth in the morning 30 minutes before breakfast for stomach   POLYETHYLENE GLYCOL (MIRALAX / GLYCOLAX) PACKET    Take 17 g by mouth daily.    PROPYLENE GLYCOL (SYSTANE BALANCE) 0.6 % SOLN    Place 1 drop into both eyes 2 (two)  times daily.   RANIBIZUMAB (LUCENTIS) 0.3 MG/0.05ML SOLN    Inject into left eye every 5 weeks.   SERTRALINE (ZOLOFT) 50 MG TABLET    TAKE 1 TABLET (50 MG TOTAL) BY MOUTH DAILY. TO CALM NERVES   TEMAZEPAM (RESTORIL) 30 MG CAPSULE    Take 30 mg by mouth at bedtime as needed. for sleep   TOBRAMYCIN (TOBREX) 0.3 % OPHTHALMIC SOLUTION    Place 1 drop into both eyes 2 (two) times daily as needed (for dry eyes).  Modified Medications   No medications on file  Discontinued Medications   No medications on file     Physical Exam:  Vitals:   08/16/16 1039  BP: (!) 182/74  Pulse: 62  Resp: 17  Temp: 97.3 F (36.3 C)  TempSrc: Oral  SpO2: 95%  Weight: 130 lb 12.8 oz (59.3 kg)  Height: _0  (1.6 m)   Body mass index is 23.17 kg/m.  Physical Exam  Constitutional: She is oriented to person, place, and time. She appears well-developed and well-nourished. No distress.  HENT:  Head: Normocephalic and atraumatic.  Mouth/Throat: No oropharyngeal exudate.  Eyes: Pupils are equal, round, and reactive to light.  Neck: Normal range of motion. Neck supple. No thyromegaly present.  Cardiovascular: Normal rate, regular rhythm and normal heart sounds.   Pulmonary/Chest: Effort normal. She has decreased breath sounds.  Abdominal: Soft. Bowel sounds  are normal.  Lymphadenopathy:    She has no cervical adenopathy.  Neurological: She is alert and oriented to person, place, and time.  Skin: Skin is warm and dry. She is not diaphoretic.  Psychiatric: She has a normal mood and affect. Her behavior is normal. Judgment and thought content normal.    Labs reviewed: Basic Metabolic Panel:  Recent Labs  12/04/15 1008  04/11/16 1011 08/02/16 1047 08/02/16 1759 08/03/16 0520 08/04/16 0451  NA 135  < > 137 131*  --  132* 129*  K 3.7  --  4.1 2.7*  --  3.4* 3.1*  CL 90*  --  94* 87*  --  91* 87*  CO2 30*  --  33* 36*  --  32 36*  GLUCOSE 82  < > 88 107*  --  92 99  BUN 9  < > 12 11  --  7 7  CREATININE 0.67  --  0.74 0.74  --  0.53 0.61  CALCIUM 9.3  --  9.5 8.3*  --  8.1* 8.3*  MG  --   --   --   --  1.5*  --   --   PHOS  --   --   --   --  2.1*  --   --   TSH 11.460*  --  3.39  --   --   --   --   < > = values in this interval not displayed. Liver Function Tests:  Recent Labs  04/11/16 1011 08/02/16 1047 08/02/16 1759  AST _1 ALT 12 13* 13*  ALKPHOS 79 72 68  BILITOT 0.4 0.6 0.6  PROT 6.1 6.1* 5.9*  ALBUMIN 3.8 3.4* 3.5   No results for input(s): LIPASE, AMYLASE in the last 8760 hours. No results for input(s): AMMONIA in the last 8760 hours. CBC:  Recent Labs  08/02/16 1016 08/03/16 0520  WBC 4.6 2.9*  NEUTROABS 3.3  --   HGB 13.3 11.9*  HCT 37.3 35.1*  MCV 81.1 85.2  PLT 108* 113*   Lipid Panel:  Recent Labs  04/11/16 1037  CHOL 193  HDL 49  LDLCALC 113  TRIG 155*  CHOLHDL 3.9   TSH:  Recent Labs  12/04/15 1008 04/11/16 1011  TSH 11.460* 3.39   A1C: Lab Results  Component Value Date   HGBA1C  07/23/2007    5.7 (NOTE)   The ADA recommends the following therapeutic goals for glycemic   control related to Hgb A1C measurement:   Goal of Therapy:   < 7.0% Hgb A1C   Action Suggested:  > 8.0% Hgb A1C   Ref:  Diabetes Care, 22, Suppl. 1, 1999     Assessment/Plan 1. Essential  hypertension - Uncontrolled on Losartan 50 mg daily, Toprol XL 100 mg daily, and Cardura 4 mg twice daily. - Education provided on the DASH eating plan.  -to increase losartan to 100 mg daily - losartan (COZAAR) 100 MG tablet; Take 1 tablet (100 mg total) by mouth daily.  Dispense: 30 tablet; Refill: 0 - BMP with eGFR  2. Acute bronchitis, unspecified organism - Resolved. Doxycycline completed.   3. Hypokalemia - K 3.1 on discharge from the hospital (12/21), will re-check today. Denies leg cramps, weakness, nausea, and dizziness.   4. Low back pain without sciatica, unspecified back pain laterality, unspecified chronicity - Stable with Lidocaine patches, Tylenol, and heat. Currently working with PT in the home.  - ASPERCREME LIDOCAINE 4 % PTCH; Apply 1 patch topically 2 (two) times daily.  Dispense: 30 patch; Refill: 3   Follow-up in 2 weeks on BP  Migdalia Olejniczak K. Harle Battiest  [facility] [phone] 8 am - 5 pm) [phone] (after hours)

## 2016-08-16 NOTE — Patient Instructions (Addendum)
Increase losartan to 100 mg daily-- new prescription called in, may use 2 of the 50s until complete  Use the least amount of Xanax daily  Follow up in 2 weeks on blood pressure  DASH Eating Plan DASH stands for "Dietary Approaches to Stop Hypertension." The DASH eating plan is a healthy eating plan that has been shown to reduce high blood pressure (hypertension). Additional health benefits may include reducing the risk of type 2 diabetes mellitus, heart disease, and stroke. The DASH eating plan may also help with weight loss. What do I need to know about the DASH eating plan? For the DASH eating plan, you will follow these general guidelines:  Choose foods with less than 150 milligrams of sodium per serving (as listed on the food label).  Use salt-free seasonings or herbs instead of table salt or sea salt.  Check with your health care provider or pharmacist before using salt substitutes.  Eat lower-sodium products. These are often labeled as "low-sodium" or "no salt added."  Eat fresh foods. Avoid eating a lot of canned foods.  Eat more vegetables, fruits, and low-fat dairy products.  Choose whole grains. Look for the word "whole" as the first word in the ingredient list.  Choose fish and skinless chicken or [place] more often than red meat. Limit fish, poultry, and meat to 6 oz (170 g) each day.  Limit sweets, desserts, sugars, and sugary drinks.  Choose heart-healthy fats.  Eat more home-cooked food and less restaurant, buffet, and fast food.  Limit fried foods.  Do not fry foods. Cook foods using methods such as baking, boiling, grilling, and broiling instead.  When eating at a restaurant, ask that your food be prepared with less salt, or no salt if possible. What foods can I eat? Seek help from a dietitian for individual calorie needs. Grains  Whole grain or whole wheat bread. Brown rice. Whole grain or whole wheat pasta. Quinoa, bulgur, and whole grain cereals. Low-sodium  cereals. Corn or whole wheat flour tortillas. Whole grain cornbread. Whole grain crackers. Low-sodium crackers. Vegetables  Fresh or frozen vegetables (raw, steamed, roasted, or grilled). Low-sodium or reduced-sodium tomato and vegetable juices. Low-sodium or reduced-sodium tomato sauce and paste. Low-sodium or reduced-sodium canned vegetables. Fruits  All fresh, canned (in natural juice), or frozen fruits. Meat and Other Protein Products  Ground beef (85% or leaner), grass-fed beef, or beef trimmed of fat. Skinless chicken or [place]. Ground chicken or [place]. Pork trimmed of fat. All fish and seafood. Eggs. Dried beans, peas, or lentils. Unsalted nuts and seeds. Unsalted canned beans. Dairy  Low-fat dairy products, such as skim or 1% milk, 2% or reduced-fat cheeses, low-fat ricotta or cottage cheese, or plain low-fat yogurt. Low-sodium or reduced-sodium cheeses. Fats and Oils  Tub margarines without trans fats. Light or reduced-fat mayonnaise and salad dressings (reduced sodium). Avocado. Safflower, olive, or canola oils. Natural peanut or almond butter. Other  Unsalted popcorn and pretzels. The items listed above may not be a complete list of recommended foods or beverages. Contact your dietitian for more options.  What foods are not recommended? Grains  White bread. White pasta. White rice. Refined cornbread. Bagels and croissants. Crackers that contain trans fat. Vegetables  Creamed or fried vegetables. Vegetables in a cheese sauce. Regular canned vegetables. Regular canned tomato sauce and paste. Regular tomato and vegetable juices. Fruits  Canned fruit in light or heavy syrup. Fruit juice. Meat and Other Protein Products  Fatty cuts of meat. Ribs, chicken wings, bacon, sausage,  bologna, salami, chitterlings, fatback, hot dogs, bratwurst, and packaged luncheon meats. Salted nuts and seeds. Canned beans with salt. Dairy  Whole or 2% milk, cream, half-and-half, and cream cheese. Whole-fat  or sweetened yogurt. Full-fat cheeses or blue cheese. Nondairy creamers and whipped toppings. Processed cheese, cheese spreads, or cheese curds. Condiments  Onion and garlic salt, seasoned salt, table salt, and sea salt. Canned and packaged gravies. Worcestershire sauce. Tartar sauce. Barbecue sauce. Teriyaki sauce. Soy sauce, including reduced sodium. Steak sauce. Fish sauce. Oyster sauce. Cocktail sauce. Horseradish. Ketchup and mustard. Meat flavorings and tenderizers. Bouillon cubes. Hot sauce. Tabasco sauce. Marinades. Taco seasonings. Relishes. Fats and Oils  Butter, stick margarine, lard, shortening, ghee, and bacon fat. Coconut, palm kernel, or palm oils. Regular salad dressings. Other  Pickles and olives. Salted popcorn and pretzels. The items listed above may not be a complete list of foods and beverages to avoid. Contact your dietitian for more information.  Where can I find more information? National Heart, Lung, and Blood Institute: [employer] This information is not intended to replace advice given to you by your health care provider. Make sure you discuss any questions you have with your health care provider. Document Released: 07/21/2011 Document Revised: 01/07/2016 Document Reviewed: 06/05/2013 Elsevier Interactive Patient Education  2017 [employer].

## 2016-08-17 DIAGNOSIS — G609 Hereditary and idiopathic neuropathy, unspecified: Secondary | ICD-10-CM | POA: Diagnosis not present

## 2016-08-17 DIAGNOSIS — I10 Essential (primary) hypertension: Secondary | ICD-10-CM | POA: Diagnosis not present

## 2016-08-17 DIAGNOSIS — M1990 Unspecified osteoarthritis, unspecified site: Secondary | ICD-10-CM | POA: Diagnosis not present

## 2016-08-17 DIAGNOSIS — H3530 Unspecified macular degeneration: Secondary | ICD-10-CM | POA: Diagnosis not present

## 2016-08-17 DIAGNOSIS — M810 Age-related osteoporosis without current pathological fracture: Secondary | ICD-10-CM | POA: Diagnosis not present

## 2016-08-17 DIAGNOSIS — J209 Acute bronchitis, unspecified: Secondary | ICD-10-CM | POA: Diagnosis not present

## 2016-08-17 LAB — BASIC METABOLIC PANEL WITH GFR
BUN: 14 mg/dL (ref 7–25)
CALCIUM: 9.7 mg/dL (ref 8.6–10.4)
CO2: 35 mmol/L — ABNORMAL HIGH (ref 20–31)
Chloride: 90 mmol/L — ABNORMAL LOW (ref 98–110)
Creat: 0.72 mg/dL (ref 0.60–0.88)
GFR, Est Non African American: 78 mL/min (ref >=60)
GLUCOSE: 91 mg/dL (ref 65–99)
POTASSIUM: 3.9 mmol/L (ref 3.5–5.3)
Sodium: 131 mmol/L — ABNORMAL LOW (ref 135–146)

## 2016-08-18 ENCOUNTER — Other Ambulatory Visit: Payer: Self-pay | Admitting: Internal Medicine

## 2016-08-18 ENCOUNTER — Other Ambulatory Visit: Payer: Self-pay

## 2016-08-18 DIAGNOSIS — M810 Age-related osteoporosis without current pathological fracture: Secondary | ICD-10-CM | POA: Diagnosis not present

## 2016-08-18 DIAGNOSIS — H3530 Unspecified macular degeneration: Secondary | ICD-10-CM | POA: Diagnosis not present

## 2016-08-18 DIAGNOSIS — M1990 Unspecified osteoarthritis, unspecified site: Secondary | ICD-10-CM | POA: Diagnosis not present

## 2016-08-18 DIAGNOSIS — G609 Hereditary and idiopathic neuropathy, unspecified: Secondary | ICD-10-CM | POA: Diagnosis not present

## 2016-08-18 DIAGNOSIS — I10 Essential (primary) hypertension: Secondary | ICD-10-CM | POA: Diagnosis not present

## 2016-08-18 DIAGNOSIS — J209 Acute bronchitis, unspecified: Secondary | ICD-10-CM | POA: Diagnosis not present

## 2016-08-18 MED ORDER — DOXAZOSIN MESYLATE 8 MG PO TABS: 8.0000 mg | ORAL_TABLET | Freq: Every day | ORAL | 1 refills | Status: DC

## 2016-08-18 MED ORDER — DOXAZOSIN MESYLATE 4 MG PO TABS: 4.0000 mg | ORAL_TABLET | Freq: Two times a day (BID) | ORAL | 3 refills | Status: DC

## 2016-08-18 NOTE — Telephone Encounter (Signed)
Message left on clinical intake voicemail: please change cardura to 8 mg daily vs 4 mg twice daily. Send new rx to pharmacy.  Request completed

## 2016-08-22 ENCOUNTER — Ambulatory Visit (INDEPENDENT_AMBULATORY_CARE_PROVIDER_SITE_OTHER): Payer: Medicare Other | Admitting: Nurse Practitioner

## 2016-08-22 ENCOUNTER — Encounter: Payer: Self-pay | Admitting: Nurse Practitioner

## 2016-08-22 VITALS — BP 143/84 | HR 62 | Temp 97.5°F | Resp 17 | Ht 63.0 in | Wt 131.2 lb

## 2016-08-22 DIAGNOSIS — N3281 Overactive bladder: Secondary | ICD-10-CM

## 2016-08-22 DIAGNOSIS — I10 Essential (primary) hypertension: Secondary | ICD-10-CM

## 2016-08-22 MED ORDER — MIRABEGRON ER 25 MG PO TB24: 25.0000 mg | ORAL_TABLET | Freq: Every day | ORAL | Status: DC

## 2016-08-22 NOTE — Patient Instructions (Addendum)
Cancel next weeks appointment and reschedule for 4 weeks with blood work prior to visit  To start Myrbetriq 25 mg daily for overactive bladder    Overactive Bladder, Adult Introduction Overactive bladder is a group of urinary symptoms. With overactive bladder, you may suddenly feel the need to pass urine (urinate) right away. After feeling this sudden urge, you might also leak urine if you cannot get to the bathroom fast enough (urinary incontinence). These symptoms might interfere with your daily work or social activities. Overactive bladder symptoms may also wake you up at night. Overactive bladder affects the nerve signals between your bladder and your brain. Your bladder may get the signal to empty before it is full. Very sensitive muscles can also make your bladder squeeze too soon. What are the causes? Many things can cause an overactive bladder. Possible causes include:  Urinary tract infection.  Infection of nearby tissues, such as the prostate.  Prostate enlargement.  Being pregnant with twins or more (multiples).  Surgery on the uterus or urethra.  Bladder stones, inflammation, or tumors.  Drinking too much caffeine or alcohol.  Certain medicines, especially those that you take to help your body get rid of extra fluid (diuretics) by increasing urine production.  Muscle or nerve weakness, especially from:  A spinal cord injury.  Stroke.  Multiple sclerosis.  Parkinson disease.  Diabetes. This can cause a high urine volume that fills the bladder so quickly that the normal urge to urinate is triggered very strongly.  Constipation. A buildup of too much stool can put pressure on your bladder. What increases the risk? You may be at greater risk for overactive bladder if you:  Are an older adult.  Smoke.  Are going through menopause.  Have prostate problems.  Have a neurological disease, such as stroke, dementia, Parkinson disease, or multiple sclerosis  (MS).  Eat or drink things that irritate the bladder. These include alcohol, spicy food, and caffeine.  Are overweight or obese. What are the signs or symptoms? The signs and symptoms of an overactive bladder include:  Sudden, strong urges to urinate.  Leaking urine.  Urinating eight or more times per day.  Waking up to urinate two or more times per night. How is this diagnosed? Your health care provider may suspect overactive bladder based on your symptoms. The health care provider will do a physical exam and take your medical history. Blood or urine tests may also be done. For example, you might need to have a bladder function test to check how well you can hold your urine. You might also need to see a health care provider who specializes in the urinary tract (urologist). How is this treated? Treatment for overactive bladder depends on the cause of your condition and whether it is mild or severe. Certain treatments can be done in your health care provider's office or clinic. You can also make lifestyle changes at home. Options include: Behavioral Treatments  Biofeedback. A specialist uses sensors to help you become aware of your body's signals.  Keeping a daily log of when you need to urinate and what happens after the urge. This may help you manage your condition.  Bladder training. This helps you learn to control the urge to urinate by following a schedule that directs you to urinate at regular intervals (timed voiding). At first, you might have to wait a few minutes after feeling the urge. In time, you should be able to schedule bathroom visits an hour or more apart.  Kegel exercises. These are exercises to strengthen the pelvic floor muscles, which support the bladder. Toning these muscles can help you control urination, even if your bladder muscles are overactive. A specialist will teach you how to do these exercises correctly. They require daily practice.  Weight loss. If you are  obese or overweight, losing weight might relieve your symptoms of overactive bladder. Talk to your health care provider about losing weight and whether there is a specific program or method that would work best for you.  Diet change. This might help if constipation is making your overactive bladder worse. Your health care provider or a dietitian can explain ways to change what you eat to ease constipation. You might also need to consume less alcohol and caffeine or drink other fluids at different times of the day.  Stopping smoking.  Wearing pads to absorb leakage while you wait for other treatments to take effect. Physical Treatments  Electrical stimulation. Electrodes send gentle pulses of electricity to strengthen the nerves or muscles that help to control the bladder. Sometimes, the electrodes are placed outside of the body. In other cases, they might be placed inside the body (implanted). This treatment can take several months to have an effect.  Supportive devices. Women may need a plastic device that fits into the vagina and supports the bladder (pessary). Medicines  Several medicines can help treat overactive bladder and are usually used along with other treatments. Some are injected into the muscles involved in urination. Others come in pill form. Your health care provider may prescribe:  Antispasmodics. These medicines block the signals that the nerves send to the bladder. This keeps the bladder from releasing urine at the wrong time.  Tricyclic antidepressants. These types of antidepressants also relax bladder muscles. Surgery  You may have a device implanted to help manage the nerve signals that indicate when you need to urinate.  You may have surgery to implant electrodes for electrical stimulation.  Sometimes, very severe cases of overactive bladder require surgery to change the shape of the bladder. Follow these instructions at home:  Take medicines only as directed by your  health care provider.  Use any implants or a pessary as directed by your health care provider.  Make any diet or lifestyle changes that are recommended by your health care provider. These might include:  Drinking less fluid or drinking at different times of the day. If you need to urinate often during the night, you may need to stop drinking fluids early in the evening.  Cutting down on caffeine or alcohol. Both can make an overactive bladder worse. Caffeine is found in coffee, tea, and sodas.  Doing Kegel exercises to strengthen muscles.  Losing weight if you need to.  Eating a healthy and balanced diet to prevent constipation.  Keep a journal or log to track how much and when you drink and also when you feel the need to urinate. This will help your health care provider to monitor your condition. Contact a health care provider if:  Your symptoms do not get better after treatment.  Your pain and discomfort are getting worse.  You have more frequent urges to urinate.  You have a fever. Get help right away if: You are not able to control your bladder at all. This information is not intended to replace advice given to you by your health care provider. Make sure you discuss any questions you have with your health care provider. Document Released: 05/28/2009 Document Revised: 01/07/2016 Document  Reviewed: 12/25/2013  2017 Elsevier

## 2016-08-22 NOTE — Progress Notes (Signed)
Careteam: Patient Care Team: Estill Dooms, MD as PCP - General (Internal Medicine) Hurman Horn, MD as Consulting Physician (Ophthalmology) Jarome Matin, MD as Consulting Physician (Dermatology) Lafayette Dragon, MD (Inactive) as Consulting Physician (Gastroenterology)  Advanced Directive information Does Patient Have a Medical Advance Directive?: Yes, Type of Advance Directive: Living will  Allergies  Allergen Reactions  . Ambien [Zolpidem Tartrate]     hallucination  . Bactrim [Sulfamethoxazole-Trimethoprim]     Unknown reaction   . Morphine And Related Nausea And Vomiting  . Zithromax [Azithromycin]     Not effective      Chief Complaint  Patient presents with  . Acute Visit    Cannot control bladder x 6 weeks or so, wearing depends; having accidents at night; sudden urges during the day.     HPI: Patient is a 83 y.o. female seen in the office today due to increased incontinence x6 weeks. Patient reports she gets the sudden urge to urinate but by the time she gets to the bathroom she has already soaked her pad and depend. She has only wet herself one time while she was sleeping without knowing.  Up 3 times per night. Drinks ~6 oz of water with dinner and then does not drink much after dinner. Takes Lozol in the a.m. Denies pain, burning, or odor. Does feel like she goes to the bathroom at least every 2 hours and does not have a decreased amount of urine. Has a bowel movement every day.   Reports nurse and PT that come into the home check her blood pressure for her and are keeping a log at her house. Overall better controlled.   Review of Systems:  Review of Systems  Constitutional: Positive for fatigue. Negative for unexpected weight change.  HENT: Negative for congestion, postnasal drip, rhinorrhea, sinus pain, sinus pressure, sneezing, sore throat and trouble swallowing.   Respiratory: Negative for shortness of breath and wheezing.   Cardiovascular: Positive for  palpitations. Negative for chest pain and leg swelling.  Gastrointestinal: Positive for constipation. Negative for abdominal pain, diarrhea (chronic controlled with Miralax), nausea and vomiting.  Genitourinary: Positive for urgency. Negative for decreased urine volume, difficulty urinating, dysuria, flank pain, frequency and hematuria.  Musculoskeletal: Positive for back pain. Negative for arthralgias, joint swelling and myalgias.  Neurological: Positive for weakness. Negative for dizziness, light-headedness and headaches.    Past Medical History:  Diagnosis Date  . Allergic rhinitis due to pollen   . Anxiety state, unspecified   . Calculus of kidney   . Cataract   . Diaphragmatic hernia without mention of obstruction or gangrene   . Diffuse cystic mastopathy   . Dizziness and giddiness   . GERD (gastroesophageal reflux disease)   . Hiatal hernia   . Insomnia, unspecified   . Insomnia, unspecified   . Lumbago   . Macular degeneration (senile) of retina, unspecified   . Open wound of toe(s), without mention of complication   . Osteoarthrosis, unspecified whether generalized or localized, unspecified site   . Other and unspecified hyperlipidemia   . Personal history of fall   . Sciatica   . Sebaceous cyst   . Senile osteoporosis   . Unspecified constipation   . Unspecified essential hypertension   . Unspecified hereditary and idiopathic peripheral neuropathy   . Unspecified hypothyroidism   . Unspecified tinnitus    Past Surgical History:  Procedure Laterality Date  . CATARACT EXTRACTION, BILATERAL    . CHOLECYSTECTOMY  07/25/2007  .  TONSILLECTOMY AND ADENOIDECTOMY Bilateral 1943   Social History:   reports that she has never smoked. She has never used smokeless tobacco. She reports that she does not drink alcohol or use drugs.  Family History  Problem Relation Age of Onset  . Hypertension Brother   . Fibromyalgia Daughter   . Hypertension Son   . Hypertension Brother    . Hyperlipidemia Brother     Medications: Patient's Medications  New Prescriptions   No medications on file  Previous Medications   ACETAMINOPHEN (TYLENOL) 500 MG TABLET    Take 500 mg by mouth every 4 (four) hours as needed.   ALPRAZOLAM (XANAX) 1 MG TABLET    Take one tablet up to 3 tablets daily for anxiety or rest   ASPERCREME LIDOCAINE 4 % PTCH    Apply 1 patch topically 2 (two) times daily.   BENZONATATE (TESSALON) 100 MG CAPSULE    Take 1 capsule (100 mg total) by mouth 3 (three) times daily as needed for cough.   BEVACIZUMAB (AVASTIN) 100 MG/4ML SOLN    Inject monthly in right eye every 6 weeks.   CYANOCOBALAMIN (VITAMIN B-12 PO)    Take 1 tablet by mouth daily.   DOXAZOSIN (CARDURA) 8 MG TABLET    Take 1 tablet (8 mg total) by mouth daily.   INDAPAMIDE (LOZOL) 1.25 MG TABLET    TAKE 1 TABLET BY MOUTH DAILY   KETOROLAC (ACULAR) 0.5 % OPHTHALMIC SOLUTION    Place 1 drop into both eyes daily as needed (for eye pain).   LEVOTHYROXINE (SYNTHROID, LEVOTHROID) 100 MCG TABLET    One daily for thyroid supplement   LORATADINE (CLARITIN) 10 MG TABLET    Take 10 mg by mouth daily.    LOSARTAN (COZAAR) 100 MG TABLET    Take 1 tablet (100 mg total) by mouth daily.   METOPROLOL SUCCINATE (TOPROL-XL) 100 MG 24 HR TABLET    TAKE 1 TABLET BY MOUTH EVERY DAY WITH A MEAL TO CONTROL BLOOD PRESSURE   MULTIPLE VITAMINS-MINERALS (CENTRUM SILVER) TABLET    Take 1 tablet by mouth daily.   PANTOPRAZOLE (PROTONIX) 40 MG TABLET    Take one tablet by mouth in the morning 30 minutes before breakfast for stomach   POLYETHYLENE GLYCOL (MIRALAX / GLYCOLAX) PACKET    Take 17 g by mouth daily.    PROPYLENE GLYCOL (SYSTANE BALANCE) 0.6 % SOLN    Place 1 drop into both eyes 2 (two) times daily.   RANIBIZUMAB (LUCENTIS) 0.3 MG/0.05ML SOLN    Inject into left eye every 5 weeks.   SERTRALINE (ZOLOFT) 50 MG TABLET    TAKE 1 TABLET (50 MG TOTAL) BY MOUTH DAILY. TO CALM NERVES   TEMAZEPAM (RESTORIL) 30 MG CAPSULE    Take  30 mg by mouth at bedtime as needed. for sleep   TOBRAMYCIN (TOBREX) 0.3 % OPHTHALMIC SOLUTION    Place 1 drop into both eyes 2 (two) times daily as needed (for dry eyes).  Modified Medications   No medications on file  Discontinued Medications   DEXTROMETHORPHAN (DELSYM) 30 MG/5ML LIQUID    Take 30 mg by mouth every 12 (twelve) hours as needed for cough.     Physical Exam:  Vitals:   08/22/16 1039  BP: (!) 143/84  Pulse: 62  Resp: 17  Temp: 97.5 F (36.4 C)  TempSrc: Oral  SpO2: 95%  Weight: 131 lb 3.2 oz (59.5 kg)  Height: 5' 3" (1.6 m)   Body mass index is  23.24 kg/m.  Physical Exam  Constitutional: She is oriented to person, place, and time. She appears well-developed and well-nourished. No distress.  HENT:  Head: Normocephalic and atraumatic.  Mouth/Throat: No oropharyngeal exudate.  Eyes: Pupils are equal, round, and reactive to light.  Neck: Normal range of motion. Neck supple. No thyromegaly present.  Cardiovascular: Normal rate, regular rhythm and normal heart sounds.   Pulmonary/Chest: Effort normal and breath sounds normal.  Abdominal: Soft. Bowel sounds are normal.  Musculoskeletal: She exhibits no edema.  Lymphadenopathy:    She has no cervical adenopathy.  Neurological: She is alert and oriented to person, place, and time.  Skin: Skin is warm and dry. She is not diaphoretic.  Psychiatric: She has a normal mood and affect. Her behavior is normal. Judgment and thought content normal.    Labs reviewed: Basic Metabolic Panel:  Recent Labs  12/04/15 1008 04/11/16 1011  08/02/16 1759 08/03/16 0520 08/04/16 0451 08/16/16 1145  NA 135 137  < >  --  132* 129* 131*  K 3.7 4.1  < >  --  3.4* 3.1* 3.9  CL 90* 94*  < >  --  91* 87* 90*  CO2 30* 33*  < >  --  32 36* 35*  GLUCOSE 82 88  < >  --  92 99 91  BUN 9 12  < >  --  _0 CREATININE 0.67 0.74  < >  --  0.53 0.61 0.72  CALCIUM 9.3 9.5  < >  --  8.1* 8.3* 9.7  MG  --   --   --  1.5*  --   --   --    PHOS  --   --   --  2.1*  --   --   --   TSH 11.460* 3.39  --   --   --   --   --   < > = values in this interval not displayed. Liver Function Tests:  Recent Labs  04/11/16 1011 08/02/16 1047 08/02/16 1759  AST _1 ALT 12 13* 13*  ALKPHOS 79 72 68  BILITOT 0.4 0.6 0.6  PROT 6.1 6.1* 5.9*  ALBUMIN 3.8 3.4* 3.5   No results for input(s): LIPASE, AMYLASE in the last 8760 hours. No results for input(s): AMMONIA in the last 8760 hours. CBC:  Recent Labs  08/02/16 1016 08/03/16 0520  WBC 4.6 2.9*  NEUTROABS 3.3  --   HGB 13.3 11.9*  HCT 37.3 35.1*  MCV 81.1 85.2  PLT 108* 113*   Lipid Panel:  Recent Labs  04/11/16 1037  CHOL 193  HDL 49  LDLCALC 113  TRIG 155*  CHOLHDL 3.9   TSH:  Recent Labs  12/04/15 1008 04/11/16 1011  TSH 11.460* 3.39   A1C: Lab Results  Component Value Date   HGBA1C  07/23/2007    5.7 (NOTE)   The ADA recommends the following therapeutic goals for glycemic   control related to Hgb A1C measurement:   Goal of Therapy:   < 7.0% Hgb A1C   Action Suggested:  > 8.0% Hgb A1C   Ref:  Diabetes Care, 22, Suppl. 1, 1999     Assessment/Plan 1. Overactive bladder - Lozol a contributing factor but not able to D/C at this time due uncontrolled HTN. Will consider stopping at future office visit if blood pressure with improved control. - Education provided on overactive bladder, toileting schedule, etc. - mirabegron ER (MYRBETRIQ) 25 MG  TB24 tablet; Take 1 tablet (25 mg total) by mouth daily.   2. Essential hypertension - Improving on increased Losartan in addition to Metoprolol, Cardura, and Lozol.  - BMP with eGFR; Future - Bring the log of blood pressure readings with you to your next appointment    Follow-up in 4 weeks on incontinence and BP.  Janett Billow K. Harle Battiest  [facility] [phone] 8 am - 5 pm) [phone] (after hours)

## 2016-08-23 DIAGNOSIS — M1990 Unspecified osteoarthritis, unspecified site: Secondary | ICD-10-CM | POA: Diagnosis not present

## 2016-08-23 DIAGNOSIS — H3530 Unspecified macular degeneration: Secondary | ICD-10-CM | POA: Diagnosis not present

## 2016-08-23 DIAGNOSIS — M810 Age-related osteoporosis without current pathological fracture: Secondary | ICD-10-CM | POA: Diagnosis not present

## 2016-08-23 DIAGNOSIS — G609 Hereditary and idiopathic neuropathy, unspecified: Secondary | ICD-10-CM | POA: Diagnosis not present

## 2016-08-23 DIAGNOSIS — J209 Acute bronchitis, unspecified: Secondary | ICD-10-CM | POA: Diagnosis not present

## 2016-08-23 DIAGNOSIS — I10 Essential (primary) hypertension: Secondary | ICD-10-CM | POA: Diagnosis not present

## 2016-08-24 ENCOUNTER — Other Ambulatory Visit: Payer: Self-pay | Admitting: Internal Medicine

## 2016-08-24 DIAGNOSIS — Z1231 Encounter for screening mammogram for malignant neoplasm of breast: Secondary | ICD-10-CM

## 2016-08-25 ENCOUNTER — Other Ambulatory Visit: Payer: Self-pay | Admitting: Nurse Practitioner

## 2016-08-25 DIAGNOSIS — G609 Hereditary and idiopathic neuropathy, unspecified: Secondary | ICD-10-CM | POA: Diagnosis not present

## 2016-08-25 DIAGNOSIS — J209 Acute bronchitis, unspecified: Secondary | ICD-10-CM | POA: Diagnosis not present

## 2016-08-25 DIAGNOSIS — I10 Essential (primary) hypertension: Secondary | ICD-10-CM | POA: Diagnosis not present

## 2016-08-25 DIAGNOSIS — M1990 Unspecified osteoarthritis, unspecified site: Secondary | ICD-10-CM | POA: Diagnosis not present

## 2016-08-25 DIAGNOSIS — H3530 Unspecified macular degeneration: Secondary | ICD-10-CM | POA: Diagnosis not present

## 2016-08-25 DIAGNOSIS — M810 Age-related osteoporosis without current pathological fracture: Secondary | ICD-10-CM | POA: Diagnosis not present

## 2016-08-29 ENCOUNTER — Ambulatory Visit: Payer: Medicare Other | Admitting: Nurse Practitioner

## 2016-09-05 ENCOUNTER — Telehealth: Payer: Self-pay | Admitting: *Deleted

## 2016-09-05 NOTE — Telephone Encounter (Signed)
Patient stated that her Potassium has been running low and she has been feeling fatigued. Patient is wanting to know if she can start Potassium. Thinks it will give her a "pick up". Wonders if she can come in for a nurse visit also for BP check. Please Advise.

## 2016-09-06 ENCOUNTER — Ambulatory Visit: Payer: Self-pay

## 2016-09-06 ENCOUNTER — Other Ambulatory Visit: Payer: Medicare Other

## 2016-09-06 ENCOUNTER — Other Ambulatory Visit: Payer: Self-pay

## 2016-09-06 DIAGNOSIS — H353221 Exudative age-related macular degeneration, left eye, with active choroidal neovascularization: Secondary | ICD-10-CM | POA: Diagnosis not present

## 2016-09-06 DIAGNOSIS — I10 Essential (primary) hypertension: Secondary | ICD-10-CM

## 2016-09-06 LAB — BASIC METABOLIC PANEL WITH GFR
BUN: 14 mg/dL (ref 7–25)
CALCIUM: 9.4 mg/dL (ref 8.6–10.4)
CO2: 34 mmol/L — ABNORMAL HIGH (ref 20–31)
CREATININE: 0.68 mg/dL (ref 0.60–0.88)
Chloride: 93 mmol/L — ABNORMAL LOW (ref 98–110)
GFR, Est African American: >89 mL/min (ref >=60)
GFR, Est Non African American: 81 mL/min (ref >=60)
Glucose, Bld: 118 mg/dL — ABNORMAL HIGH (ref 65–99)
Potassium: 4 mmol/L (ref 3.5–5.3)
SODIUM: 133 mmol/L — AB (ref 135–146)

## 2016-09-06 MED ORDER — POTASSIUM CHLORIDE CRYS ER 20 MEQ PO TBCR: 20.0000 meq | EXTENDED_RELEASE_TABLET | Freq: Every day | ORAL | 0 refills | Status: DC

## 2016-09-06 NOTE — Telephone Encounter (Signed)
Potassium was normal 3 weeks ago. She should come to the lab for BMP. Send in Rx for KCl 20 mEq (30) One daily for potassium supplement. Nurse check BP when she comes for lab.

## 2016-09-06 NOTE — Telephone Encounter (Signed)
Patient notified. Faxed Rx to pharmacy. Scheduled lab and nurse visit today.

## 2016-09-07 ENCOUNTER — Other Ambulatory Visit: Payer: Self-pay

## 2016-09-10 ENCOUNTER — Other Ambulatory Visit: Payer: Self-pay | Admitting: Internal Medicine

## 2016-09-12 ENCOUNTER — Other Ambulatory Visit: Payer: Self-pay | Admitting: Adult Health

## 2016-09-13 DIAGNOSIS — H3530 Unspecified macular degeneration: Secondary | ICD-10-CM | POA: Diagnosis not present

## 2016-09-13 DIAGNOSIS — M810 Age-related osteoporosis without current pathological fracture: Secondary | ICD-10-CM | POA: Diagnosis not present

## 2016-09-13 DIAGNOSIS — M1990 Unspecified osteoarthritis, unspecified site: Secondary | ICD-10-CM | POA: Diagnosis not present

## 2016-09-13 DIAGNOSIS — J209 Acute bronchitis, unspecified: Secondary | ICD-10-CM | POA: Diagnosis not present

## 2016-09-13 DIAGNOSIS — G609 Hereditary and idiopathic neuropathy, unspecified: Secondary | ICD-10-CM | POA: Diagnosis not present

## 2016-09-13 DIAGNOSIS — I10 Essential (primary) hypertension: Secondary | ICD-10-CM | POA: Diagnosis not present

## 2016-09-19 DIAGNOSIS — G609 Hereditary and idiopathic neuropathy, unspecified: Secondary | ICD-10-CM | POA: Diagnosis not present

## 2016-09-19 DIAGNOSIS — H3530 Unspecified macular degeneration: Secondary | ICD-10-CM | POA: Diagnosis not present

## 2016-09-19 DIAGNOSIS — J209 Acute bronchitis, unspecified: Secondary | ICD-10-CM | POA: Diagnosis not present

## 2016-09-19 DIAGNOSIS — M1990 Unspecified osteoarthritis, unspecified site: Secondary | ICD-10-CM | POA: Diagnosis not present

## 2016-09-19 DIAGNOSIS — M810 Age-related osteoporosis without current pathological fracture: Secondary | ICD-10-CM | POA: Diagnosis not present

## 2016-09-19 DIAGNOSIS — I10 Essential (primary) hypertension: Secondary | ICD-10-CM | POA: Diagnosis not present

## 2016-09-22 ENCOUNTER — Ambulatory Visit (INDEPENDENT_AMBULATORY_CARE_PROVIDER_SITE_OTHER): Payer: Medicare Other | Admitting: Nurse Practitioner

## 2016-09-22 ENCOUNTER — Encounter: Payer: Self-pay | Admitting: Nurse Practitioner

## 2016-09-22 VITALS — BP 138/82 | HR 62 | Temp 97.5°F | Resp 18 | Ht 63.0 in | Wt 128.0 lb

## 2016-09-22 DIAGNOSIS — N3281 Overactive bladder: Secondary | ICD-10-CM | POA: Diagnosis not present

## 2016-09-22 DIAGNOSIS — I10 Essential (primary) hypertension: Secondary | ICD-10-CM | POA: Diagnosis not present

## 2016-09-22 DIAGNOSIS — R32 Unspecified urinary incontinence: Secondary | ICD-10-CM | POA: Insufficient documentation

## 2016-09-22 NOTE — Progress Notes (Signed)
Careteam: Patient Care Team: Kimber RelicArthur G Green, MD as PCP - General (Internal Medicine) Edmon CrapeGary A Rankin, MD as Consulting Physician (Ophthalmology) Donzetta Starchrew Jones, MD as Consulting Physician (Dermatology) Hart Carwinora M Brodie, MD (Inactive) as Consulting Physician (Gastroenterology)  Advanced Directive information Does Patient Have a Medical Advance Directive?: Yes, Type of Advance Directive: Living will  Allergies  Allergen Reactions  . Ambien [Zolpidem Tartrate]     hallucination  . Bactrim [Sulfamethoxazole-Trimethoprim]     Unknown reaction   . Morphine And Related Nausea And Vomiting  . Zithromax [Azithromycin]     Not effective      Chief Complaint  Patient presents with  . Medical Management of Chronic Issues    2 week follow up.      HPI: Patient is a 83 y.o. female seen in the office today for 2 week follow up for elevated Bp. Pt states that she has been feeling better, stronger, more stable on her feet. She is being seen by home health and PT post discharge from the hospital for bronchitis once per week. Still uses cane when ambulating. Also has home rolling walker with brakes at home. She has been doing exercises on her own as well to increase strength.   BP's have been reported as 130's-140's/60's-80's to 110's-120's/ 60-80's. She has home health check these when they are there. She does not have a home BP machine. Otherwise checks it when she is at the pharmacy. She has had one reading as low as 118 SBP and reports that she has intermittently been skipping and decreasing her Cardura form 8mg  to 4 mg. She is anxious about becoming hypotensive.   She has c/o frequency and urgency. She has been doing Kegel exercises and planning toileting schedules with more success. Not taking myrbetriq due to possible side effects    Review of Systems:  Review of Systems  Constitutional: Negative for activity change, appetite change, chills, fatigue and fever.  HENT: Negative for congestion,  rhinorrhea, sinus pain, sinus pressure, sneezing and sore throat.   Respiratory: Negative for cough, chest tightness, shortness of breath and wheezing.   Cardiovascular: Negative for chest pain, palpitations and leg swelling.  Gastrointestinal: Negative for abdominal distention, abdominal pain, diarrhea, nausea and vomiting.  Genitourinary: Positive for frequency and urgency.       Has been doing Kegels  Neurological: Negative for dizziness, syncope, light-headedness and headaches.  Psychiatric/Behavioral: Negative.       Past Medical History:  Diagnosis Date  . Allergic rhinitis due to pollen   . Anxiety state, unspecified   . Calculus of kidney   . Cataract   . Diaphragmatic hernia without mention of obstruction or gangrene   . Diffuse cystic mastopathy   . Dizziness and giddiness   . GERD (gastroesophageal reflux disease)   . Hiatal hernia   . Insomnia, unspecified   . Insomnia, unspecified   . Lumbago   . Macular degeneration (senile) of retina, unspecified   . Open wound of toe(s), without mention of complication   . Osteoarthrosis, unspecified whether generalized or localized, unspecified site   . Other and unspecified hyperlipidemia   . Personal history of fall   . Sciatica   . Sebaceous cyst   . Senile osteoporosis   . Unspecified constipation   . Unspecified essential hypertension   . Unspecified hereditary and idiopathic peripheral neuropathy   . Unspecified hypothyroidism   . Unspecified tinnitus    Past Surgical History:  Procedure Laterality Date  . CATARACT  EXTRACTION, BILATERAL    . CHOLECYSTECTOMY  07/25/2007  . TONSILLECTOMY AND ADENOIDECTOMY Bilateral 1943   Social History:   reports that she has never smoked. She has never used smokeless tobacco. She reports that she does not drink alcohol or use drugs.  Family History  Problem Relation Age of Onset  . Hypertension Brother   . Fibromyalgia Daughter   . Hypertension Son   . Hypertension Brother     . Hyperlipidemia Brother     Medications: Patient's Medications  New Prescriptions   No medications on file  Previous Medications   ACETAMINOPHEN (TYLENOL) 500 MG TABLET    Take 500 mg by mouth every 4 (four) hours as needed.   ALPRAZOLAM (XANAX) 1 MG TABLET    TAKE 1 TABLET UP TO 3 TIMES DAILY FOR ANXIETY OR REST   ASPERCREME LIDOCAINE 4 % PTCH    Apply 1 patch topically 2 (two) times daily.   BENZONATATE (TESSALON) 100 MG CAPSULE    Take 1 capsule (100 mg total) by mouth 3 (three) times daily as needed for cough.   BEVACIZUMAB (AVASTIN) 100 MG/4ML SOLN    Inject monthly in right eye every 6 weeks.   CYANOCOBALAMIN (VITAMIN B-12 PO)    Take 1 tablet by mouth daily.   DOXAZOSIN (CARDURA) 8 MG TABLET    Take 1 tablet (8 mg total) by mouth daily.   INDAPAMIDE (LOZOL) 1.25 MG TABLET    TAKE 1 TABLET BY MOUTH DAILY   KETOROLAC (ACULAR) 0.5 % OPHTHALMIC SOLUTION    Place 1 drop into both eyes daily as needed (for eye pain).   LEVOTHYROXINE (SYNTHROID, LEVOTHROID) 100 MCG TABLET    One daily for thyroid supplement   LORATADINE (CLARITIN) 10 MG TABLET    Take 10 mg by mouth daily.    LOSARTAN (COZAAR) 100 MG TABLET    Take 1 tablet (100 mg total) by mouth daily.   METOPROLOL SUCCINATE (TOPROL-XL) 100 MG 24 HR TABLET    TAKE 1 TABLET BY MOUTH EVERY DAY WITH A MEAL TO CONTROL BLOOD PRESSURE   MULTIPLE VITAMINS-MINERALS (CENTRUM SILVER) TABLET    Take 1 tablet by mouth daily.   PANTOPRAZOLE (PROTONIX) 40 MG TABLET    TAKE ONE TABLET BY MOUTH IN THE MORNING 30 MINUTES BEFORE BREAKFAST FOR STOMACH   POLYETHYLENE GLYCOL (MIRALAX / GLYCOLAX) PACKET    Take 17 g by mouth daily.    POTASSIUM CHLORIDE SA (K-DUR,KLOR-CON) 20 MEQ TABLET    Take 1 tablet (20 mEq total) by mouth daily. For potassium supplement   PROPYLENE GLYCOL (SYSTANE BALANCE) 0.6 % SOLN    Place 1 drop into both eyes 2 (two) times daily.   RANIBIZUMAB (LUCENTIS) 0.3 MG/0.05ML SOLN    Inject into left eye every 5 weeks.   SERTRALINE  (ZOLOFT) 50 MG TABLET    TAKE 1 TABLET (50 MG TOTAL) BY MOUTH DAILY. TO CALM NERVES   TEMAZEPAM (RESTORIL) 30 MG CAPSULE    TAKE ONE CAPSULE BY MOUTH AT BEDTIME AS NEEDED FOR SLEEP   TOBRAMYCIN (TOBREX) 0.3 % OPHTHALMIC SOLUTION    Place 1 drop into both eyes 2 (two) times daily as needed (for dry eyes).  Modified Medications   No medications on file  Discontinued Medications   MIRABEGRON ER (MYRBETRIQ) 25 MG TB24 TABLET    Take 1 tablet (25 mg total) by mouth daily.     Physical Exam Physical Exam  Constitutional: She is oriented to person, place, and time. She appears  well-developed and well-nourished. No distress.  HENT:  Head: Atraumatic.  Eyes: Conjunctivae and EOM are normal. Pupils are equal, round, and reactive to light.  Cardiovascular: Normal rate, regular rhythm and normal heart sounds.   No murmur heard. Pulmonary/Chest: Effort normal and breath sounds normal. No respiratory distress. She has no wheezes. She exhibits no tenderness.  Musculoskeletal: She exhibits no edema or tenderness.  Mild low back pain  Neurological: She is alert and oriented to person, place, and time.  Skin: Skin is warm and dry.  Psychiatric: She has a normal mood and affect. Her behavior is normal. Judgment and thought content normal.    Labs reviewed: Basic Metabolic Panel:  Recent Labs  16/10/96 1008 04/11/16 1011  08/02/16 1759  08/04/16 0451 08/16/16 1145 09/06/16 1557  NA 135 137  < >  --   < > 129* 131* 133*  K 3.7 4.1  < >  --   < > 3.1* 3.9 4.0  CL 90* 94*  < >  --   < > 87* 90* 93*  CO2 30* 33*  < >  --   < > 36* 35* 34*  GLUCOSE 82 88  < >  --   < > 99 91 118*  BUN 9 12  < >  --   < > 7 14 14   CREATININE 0.67 0.74  < >  --   < > 0.61 0.72 0.68  CALCIUM 9.3 9.5  < >  --   < > 8.3* 9.7 9.4  MG  --   --   --  1.5*  --   --   --   --   PHOS  --   --   --  2.1*  --   --   --   --   TSH 11.460* 3.39  --   --   --   --   --   --   < > = values in this interval not  displayed. Liver Function Tests:  Recent Labs  04/11/16 1011 08/02/16 1047 08/02/16 1759  AST 17 19 21   ALT 12 13* 13*  ALKPHOS 79 72 68  BILITOT 0.4 0.6 0.6  PROT 6.1 6.1* 5.9*  ALBUMIN 3.8 3.4* 3.5   No results for input(s): LIPASE, AMYLASE in the last 8760 hours. No results for input(s): AMMONIA in the last 8760 hours. CBC:  Recent Labs  08/02/16 1016 08/03/16 0520  WBC 4.6 2.9*  NEUTROABS 3.3  --   HGB 13.3 11.9*  HCT 37.3 35.1*  MCV 81.1 85.2  PLT 108* 113*   Lipid Panel:  Recent Labs  04/11/16 1037  CHOL 193  HDL 49  LDLCALC 113  TRIG 045*  CHOLHDL 3.9   TSH:  Recent Labs  12/04/15 1008 04/11/16 1011  TSH 11.460* 3.39   A1C: Lab Results  Component Value Date   HGBA1C  07/23/2007    5.7 (NOTE)   The ADA recommends the following therapeutic goals for glycemic   control related to Hgb A1C measurement:   Goal of Therapy:   < 7.0% Hgb A1C   Action Suggested:  > 8.0% Hgb A1C   Ref:  Diabetes Care, 22, Suppl. 1, 1999     Assessment/Plan 1. Essential hypertension -encouraged to take cardura as prescribed at 8 mg daily, blood pressures in good range with medication at that dose, to notify if becoming symptomatic with BP in the 110s. Call if blood pressure becomes <100/80 -Follow up with Dr.  Green in 2 weeks.  -Monitor BP at home. Notify office if BP less than 100 sbp or pt symptomatic including dizziness or lightheadedness  2. OAB -Continue with Kegels exercises -Bladder schedule -Will follow with next appointment, sooner if needed   Dayani Winbush K. Biagio Borg  [facility] [phone] 8 am - 5 pm) [phone] (after hours)

## 2016-09-22 NOTE — Patient Instructions (Signed)
-  Continue taking 8mg  of Cardura daily -Continue all blood pressure medications at this time -Will re-evaluate with Dr. Chilton SiGreen appointment in a few weeks

## 2016-09-26 DIAGNOSIS — H3530 Unspecified macular degeneration: Secondary | ICD-10-CM | POA: Diagnosis not present

## 2016-09-26 DIAGNOSIS — G609 Hereditary and idiopathic neuropathy, unspecified: Secondary | ICD-10-CM | POA: Diagnosis not present

## 2016-09-26 DIAGNOSIS — M810 Age-related osteoporosis without current pathological fracture: Secondary | ICD-10-CM | POA: Diagnosis not present

## 2016-09-26 DIAGNOSIS — J209 Acute bronchitis, unspecified: Secondary | ICD-10-CM | POA: Diagnosis not present

## 2016-09-26 DIAGNOSIS — M1990 Unspecified osteoarthritis, unspecified site: Secondary | ICD-10-CM | POA: Diagnosis not present

## 2016-09-26 DIAGNOSIS — I10 Essential (primary) hypertension: Secondary | ICD-10-CM | POA: Diagnosis not present

## 2016-09-29 ENCOUNTER — Ambulatory Visit: Payer: Medicare Other

## 2016-10-05 ENCOUNTER — Other Ambulatory Visit: Payer: Medicare Other

## 2016-10-05 ENCOUNTER — Other Ambulatory Visit: Payer: Self-pay | Admitting: Internal Medicine

## 2016-10-05 ENCOUNTER — Ambulatory Visit (INDEPENDENT_AMBULATORY_CARE_PROVIDER_SITE_OTHER): Payer: Medicare Other

## 2016-10-05 VITALS — BP 160/82 | HR 59 | Temp 97.3°F | Ht 63.0 in | Wt 128.4 lb

## 2016-10-05 DIAGNOSIS — Z0000 Encounter for general adult medical examination without abnormal findings: Secondary | ICD-10-CM

## 2016-10-05 DIAGNOSIS — I10 Essential (primary) hypertension: Secondary | ICD-10-CM | POA: Diagnosis not present

## 2016-10-05 DIAGNOSIS — E785 Hyperlipidemia, unspecified: Secondary | ICD-10-CM

## 2016-10-05 DIAGNOSIS — E039 Hypothyroidism, unspecified: Secondary | ICD-10-CM | POA: Diagnosis not present

## 2016-10-05 LAB — CBC WITH DIFFERENTIAL/PLATELET
Basophils Absolute: 0 cells/uL (ref 0–200)
Basophils Relative: 0 %
Eosinophils Absolute: 43 cells/uL (ref 15–500)
Eosinophils Relative: 1 %
HEMATOCRIT: 37.9 % (ref 35.0–45.0)
Hemoglobin: 12.6 g/dL (ref 11.7–15.5)
Lymphocytes Relative: 18 %
Lymphs Abs: 774 cells/uL — ABNORMAL LOW (ref 850–3900)
MCH: 28.4 pg (ref 27.0–33.0)
MCHC: 33.2 g/dL (ref 32.0–36.0)
MCV: 85.4 fL (ref 80.0–100.0)
MONO ABS: 258 {cells}/uL (ref 200–950)
MPV: 9.1 fL (ref 7.5–12.5)
Monocytes Relative: 6 %
Neutro Abs: 3225 cells/uL (ref 1500–7800)
Neutrophils Relative %: 75 %
Platelets: 146 10*3/uL (ref 140–400)
RBC: 4.44 MIL/uL (ref 3.80–5.10)
RDW: 14.6 % (ref 11.0–15.0)
WBC: 4.3 10*3/uL (ref 3.8–10.8)

## 2016-10-05 LAB — LIPID PANEL
CHOL/HDL RATIO: 3.4 ratio (ref <5.0)
CHOLESTEROL: 189 mg/dL (ref <200)
HDL: 56 mg/dL (ref >50)
LDL Cholesterol: 109 mg/dL — ABNORMAL HIGH (ref <100)
TRIGLYCERIDES: 118 mg/dL (ref <150)
VLDL: 24 mg/dL (ref <30)

## 2016-10-05 LAB — BASIC METABOLIC PANEL
BUN: 10 mg/dL (ref 7–25)
CHLORIDE: 90 mmol/L — AB (ref 98–110)
CO2: 34 mmol/L — ABNORMAL HIGH (ref 20–31)
CREATININE: 0.73 mg/dL (ref 0.60–0.88)
Calcium: 9.2 mg/dL (ref 8.6–10.4)
Glucose, Bld: 93 mg/dL (ref 65–99)
Potassium: 3.7 mmol/L (ref 3.5–5.3)
Sodium: 132 mmol/L — ABNORMAL LOW (ref 135–146)

## 2016-10-05 LAB — TSH: TSH: 6.18 m[IU]/L — AB

## 2016-10-05 NOTE — Progress Notes (Signed)
Quick Notes   Health Maintenance:  None    Abnormal Screen: MMSE 29/30 Passed Clock tets    Patient Concerns:  Pt would like to discuss her potassium deficiency and her elevated BP. Pt had a recent fall, where she hurt her back and ended up in the hospital (with bronchial infection as well). She went through rehab for her walking and she is working on her exercises daily.       Nurse Concerns:  Elevated Bp: 160/82 Recheck: 150/80  I have reviewed the information entered by the Health Advisor. I was present in the office during the time of patient interaction and was available for consultation. I agree with the documentation and advice.  Lenon CurtArthur G. Chilton SiGreen, MD

## 2016-10-05 NOTE — Patient Instructions (Addendum)
Ms. [name] , Thank you for taking time to come for your Medicare Wellness Visit. I appreciate your ongoing commitment to your health goals. Please review the following plan we discussed and let me know if I can assist you in the future.   These are the goals we discussed: Goals    . <enter goal here>          Starting 10/05/16, I will maintain my current lifestyle; I will also attempt to increase my walking daily.        This is a list of the screening recommended for you and due dates:  Health Maintenance  Topic Date Due  . Tetanus Vaccine  12/08/2015  . Flu Shot  Completed  . DEXA scan (bone density measurement)  Completed  . Pneumonia vaccines  Completed  Preventive Care for Adults  A healthy lifestyle and preventive care can promote health and wellness. Preventive health guidelines for adults include the following key practices.  . A routine yearly physical is a good way to check with your health care provider about your health and preventive screening. It is a chance to share any concerns and updates on your health and to receive a thorough exam.  . Visit your dentist for a routine exam and preventive care every 6 months. Brush your teeth twice a day and floss once a day. Good oral hygiene prevents tooth decay and gum disease.  . The frequency of eye exams is based on your age, health, family medical history, use  of contact lenses, and other factors. Follow your health care provider's ecommendations for frequency of eye exams.  . Eat a healthy diet. Foods like vegetables, fruits, whole grains, low-fat dairy products, and lean protein foods contain the nutrients you need without too many calories. Decrease your intake of foods high in solid fats, added sugars, and salt. Eat the right amount of calories for you. Get information about a proper diet from your health care provider, if necessary.  . Regular physical exercise is one of the most important things you can do for your  health. Most adults should get at least 150 minutes of moderate-intensity exercise (any activity that increases your heart rate and causes you to sweat) each week. In addition, most adults need muscle-strengthening exercises on 2 or more days a week.  Silver Sneakers may be a benefit available to you. To determine eligibility, you may visit the website: www.silversneakers.com or contact program at [phone] Mon-Fri between 8AM-8PM.   . Maintain a healthy weight. The body mass index (BMI) is a screening tool to identify possible weight problems. It provides an estimate of body fat based on height and weight. Your health care provider can find your BMI and can help you achieve or maintain a healthy weight.   For adults 20 years and older: ? A BMI below 18.5 is considered underweight. ? A BMI of 18.5 to 24.9 is normal. ? A BMI of 25 to 29.9 is considered overweight. ? A BMI of 30 and above is considered obese.   . Maintain normal blood lipids and cholesterol levels by exercising and minimizing your intake of saturated fat. Eat a balanced diet with plenty of fruit and vegetables. Blood tests for lipids and cholesterol should begin at age 81 and be repeated every 5 years. If your lipid or cholesterol levels are high, you are over 50, or you are at high risk for heart disease, you may need your cholesterol levels checked more frequently. Ongoing high lipid  and cholesterol levels should be treated with medicines if diet and exercise are not working.  . If you smoke, find out from your health care provider how to quit. If you do not use tobacco, please do not start.  . If you choose to drink alcohol, please do not consume more than 2 drinks per day. One drink is considered to be 12 ounces (355 mL) of beer, 5 ounces (148 mL) of wine, or 1.5 ounces (44 mL) of liquor.  . If you are 54-101 years old, ask your health care provider if you should take aspirin to prevent strokes.  . Use sunscreen. Apply  sunscreen liberally and repeatedly throughout the day. You should seek shade when your shadow is shorter than you. Protect yourself by wearing long sleeves, pants, a wide-brimmed hat, and sunglasses year round, whenever you are outdoors.  . Once a month, do a whole body skin exam, using a mirror to look at the skin on your back. Tell your health care provider of new moles, moles that have irregular borders, moles that are larger than a pencil eraser, or moles that have changed in shape or color.

## 2016-10-05 NOTE — Progress Notes (Signed)
Subjective:   [name] is a 83 y.o. female who presents for an Initial Medicare Annual Wellness Visit.  Review of Systems     Cardiac Risk Factors include: advanced age (>48men, >34 women);dyslipidemia;hypertension     Objective:    Today's Vitals   10/05/16 1033  BP: (!) 160/82  Pulse: (!) 59  Temp: 97.3 F (36.3 C)  TempSrc: Oral  SpO2: 95%  Weight: 128 lb 6.4 oz (58.2 kg)  Height: 5\' 3"  (1.6 m)   Body mass index is 22.75 kg/m.   Current Medications (verified) Outpatient Encounter Prescriptions as of 10/05/2016  Medication Sig  . acetaminophen (TYLENOL) 500 MG tablet Take 500 mg by mouth every 4 (four) hours as needed.  . ALPRAZolam (XANAX) 1 MG tablet TAKE 1 TABLET UP TO 3 TIMES DAILY FOR ANXIETY OR REST  . ASPERCREME LIDOCAINE 4 % PTCH Apply 1 patch topically 2 (two) times daily.  . Bevacizumab (AVASTIN) 100 MG/4ML SOLN Inject monthly in right eye every 6 weeks.  . Cyanocobalamin (VITAMIN B-12 PO) Take 1 tablet by mouth daily.  Marland Kitchen doxazosin (CARDURA) 8 MG tablet Take 1 tablet (8 mg total) by mouth daily.  . indapamide (LOZOL) 1.25 MG tablet TAKE 1 TABLET BY MOUTH DAILY  . ketorolac (ACULAR) 0.5 % ophthalmic solution Place 1 drop into both eyes daily as needed (for eye pain).  Marland Kitchen KLOR-CON M20 20 MEQ tablet TAKE 1 TABLET (20 MEQ TOTAL) BY MOUTH DAILY. FOR POTASSIUM SUPPLEMENT  . levothyroxine (SYNTHROID, LEVOTHROID) 100 MCG tablet One daily for thyroid supplement  . loratadine (CLARITIN) 10 MG tablet Take 10 mg by mouth daily.   Marland Kitchen losartan (COZAAR) 100 MG tablet Take 1 tablet (100 mg total) by mouth daily.  . metoprolol succinate (TOPROL-XL) 100 MG 24 hr tablet TAKE 1 TABLET BY MOUTH EVERY DAY WITH A MEAL TO CONTROL BLOOD PRESSURE  . Multiple Vitamins-Minerals (CENTRUM SILVER) tablet Take 1 tablet by mouth daily.  . pantoprazole (PROTONIX) 40 MG tablet TAKE ONE TABLET BY MOUTH IN THE MORNING 30 MINUTES BEFORE BREAKFAST FOR STOMACH  . polyethylene glycol (MIRALAX /  GLYCOLAX) packet Take 17 g by mouth daily.   Marland Kitchen Propylene Glycol (SYSTANE BALANCE) 0.6 % SOLN Place 1 drop into both eyes 2 (two) times daily.  . Ranibizumab (LUCENTIS) 0.3 MG/0.05ML SOLN Inject into left eye every 5 weeks.  . sertraline (ZOLOFT) 50 MG tablet TAKE 1 TABLET (50 MG TOTAL) BY MOUTH DAILY. TO CALM NERVES  . temazepam (RESTORIL) 30 MG capsule TAKE ONE CAPSULE BY MOUTH AT BEDTIME AS NEEDED FOR SLEEP  . [DISCONTINUED] benzonatate (TESSALON) 100 MG capsule Take 1 capsule (100 mg total) by mouth 3 (three) times daily as needed for cough.  . [DISCONTINUED] potassium chloride SA (K-DUR,KLOR-CON) 20 MEQ tablet Take 1 tablet (20 mEq total) by mouth daily. For potassium supplement  . [DISCONTINUED] tobramycin (TOBREX) 0.3 % ophthalmic solution Place 1 drop into both eyes 2 (two) times daily as needed (for dry eyes).   No facility-administered encounter medications on file as of 10/05/2016.     Allergies (verified) Ambien [zolpidem tartrate]; Bactrim [sulfamethoxazole-trimethoprim]; Morphine and related; and Zithromax [azithromycin]   History: Past Medical History:  Diagnosis Date  . Allergic rhinitis due to pollen   . Anxiety state, unspecified   . Calculus of kidney   . Cataract   . Diaphragmatic hernia without mention of obstruction or gangrene   . Diffuse cystic mastopathy   . Dizziness and giddiness   . GERD (gastroesophageal  reflux disease)   . Hiatal hernia   . Insomnia, unspecified   . Insomnia, unspecified   . Lumbago   . Macular degeneration (senile) of retina, unspecified   . Open wound of toe(s), without mention of complication   . Osteoarthrosis, unspecified whether generalized or localized, unspecified site   . Other and unspecified hyperlipidemia   . Personal history of fall   . Sciatica   . Sebaceous cyst   . Senile osteoporosis   . Unspecified constipation   . Unspecified essential hypertension   . Unspecified hereditary and idiopathic peripheral neuropathy     . Unspecified hypothyroidism   . Unspecified tinnitus    Past Surgical History:  Procedure Laterality Date  . CATARACT EXTRACTION, BILATERAL    . CHOLECYSTECTOMY  07/25/2007  . TONSILLECTOMY AND ADENOIDECTOMY Bilateral 1943   Family History  Problem Relation Age of Onset  . Hypertension Brother   . Fibromyalgia Daughter   . Hypertension Son   . Hypertension Brother   . Hyperlipidemia Brother    Social History   Occupational History  . retired    Social History Main Topics  . Smoking status: Never Smoker  . Smokeless tobacco: Never Used  . Alcohol use No  . Drug use: No  . Sexual activity: No    Tobacco Counseling Counseling given: No   Activities of Daily Living In your present state of health, do you have any difficulty performing the following activities: 10/05/2016 08/02/2016  Hearing? N -  Vision? Y -  Difficulty concentrating or making decisions? N -  Walking or climbing stairs? Y -  Dressing or bathing? N -  Doing errands, shopping? Malvin Johns  Preparing Food and eating ? N -  Using the Toilet? N -  In the past six months, have you accidently leaked urine? Y -  Do you have problems with loss of bowel control? N -  Managing your Medications? N -  Managing your Finances? N -  Housekeeping or managing your Housekeeping? Y -  Some recent data might be hidden    Immunizations and Health Maintenance Immunization History  Administered Date(s) Administered  . Influenza,inj,Quad PF,36+ Mos 05/27/2013, 04/30/2014, 05/18/2015, 05/16/2016  . Influenza-Unspecified 05/04/2011  . Pneumococcal Conjugate-13 08/11/2015  . Pneumococcal-Unspecified 08/16/1999  . Td 12/07/2005  . Zoster 08/14/2015   Health Maintenance Due  Topic Date Due  . Janet Berlin  12/08/2015    Patient Care Team: Kimber Relic, MD as PCP - General (Internal Medicine) Edmon Crape, MD as Consulting Physician (Ophthalmology) Donzetta Starch, MD as Consulting Physician (Dermatology) Hart Carwin,  MD (Inactive) as Consulting Physician (Gastroenterology)  Indicate any recent Medical Services you may have received from other than [facility] providers in the past year (date may be approximate).     Assessment:   This is a routine wellness examination for [place].   Hearing/Vision screen Hearing Screening Comments: Last hearing screen was done a few years ago. Denies any hearing loss at this time. Pt does have tinnitus.  Vision Screening Comments: Last eye exam was done several years ago. Pt sees Dr. Luciana Axe (retina specialist) every 4 weeks for Macular Degeneration. Pt gets shots in both eyes.   Dietary issues and exercise activities discussed: Current Exercise Habits: Home exercise routine, Type of exercise: stretching;walking, Time (Minutes): 15, Frequency (Times/Week): 6, Weekly Exercise (Minutes/Week): 90, Intensity: Mild, Exercise limited by: neurologic condition(s);orthopedic condition(s)  Goals    . <enter goal here>  Starting 10/05/16, I will maintain my current lifestyle; I will also attempt to increase my walking daily.       Depression Screen PHQ 2/9 Scores 10/05/2016 04/13/2016 04/21/2015 02/18/2015 10/28/2014 04/30/2014  PHQ - 2 Score 0 0 0 0 1 0    Fall Risk Fall Risk  10/05/2016 09/22/2016 08/22/2016 08/16/2016 04/13/2016  Falls in the past year? Yes Yes Yes Yes No  Number falls in past yr: 2 or more 1 1 1  -  Injury with Fall? Yes Yes Yes Yes -  Risk Factor Category  High Fall Risk - - - -  Risk for fall due to : Impaired balance/gait;History of fall(s);Impaired mobility;Impaired vision - - - -  Follow up Falls prevention discussed;Education provided - - - -    Cognitive Function: MMSE - Mini Mental State Exam 10/05/2016 10/28/2014  Orientation to time 5 5  Orientation to Place 5 5  Registration 3 3  Attention/ Calculation 5 5  Recall 3 2  Language- name 2 objects 2 2  Language- repeat 1 1  Language- follow 3 step command 3 3  Language- read & follow direction 1 1  Write  a sentence 1 1  Copy design 0 1  Total score 29 29        Screening Tests Health Maintenance  Topic Date Due  . TETANUS/TDAP  12/08/2015  . INFLUENZA VACCINE  Completed  . DEXA SCAN  Completed  . PNA vac Low Risk Adult  Completed      Plan:    I have personally reviewed and addressed the Medicare Annual Wellness questionnaire and have noted the following in the patient's chart:  A. Medical and social history B. Use of alcohol, tobacco or illicit drugs  C. Current medications and supplements D. Functional ability and status E.  Nutritional status F.  Physical activity G. Advance directives H. List of other physicians I.  Hospitalizations, surgeries, and ER visits in previous 12 months J.  Vitals K. Screenings to include hearing, vision, cognitive, depression L. Referrals and appointments - none  In addition, I have reviewed and discussed with patient certain preventive protocols, quality metrics, and best practice recommendations. A written personalized care plan for preventive services as well as general preventive health recommendations were provided to patient.  See attached scanned questionnaire for additional information.   Signed,   Nilda CalamityAlisa McCutcheon, LPN Health Advisor  I have reviewed the information entered by the Health Advisor. I was present in the office during the time of patient interaction and was available for consultation. I agree with the documentation and advice.  Lenon CurtArthur G. Chilton SiGreen, MD

## 2016-10-10 ENCOUNTER — Ambulatory Visit: Payer: Medicare Other

## 2016-10-10 DIAGNOSIS — I952 Hypotension due to drugs: Secondary | ICD-10-CM | POA: Diagnosis not present

## 2016-10-10 DIAGNOSIS — H353124 Nonexudative age-related macular degeneration, left eye, advanced atrophic with subfoveal involvement: Secondary | ICD-10-CM | POA: Diagnosis not present

## 2016-10-10 DIAGNOSIS — H353113 Nonexudative age-related macular degeneration, right eye, advanced atrophic without subfoveal involvement: Secondary | ICD-10-CM | POA: Diagnosis not present

## 2016-10-10 DIAGNOSIS — H353221 Exudative age-related macular degeneration, left eye, with active choroidal neovascularization: Secondary | ICD-10-CM | POA: Diagnosis not present

## 2016-10-11 ENCOUNTER — Ambulatory Visit (INDEPENDENT_AMBULATORY_CARE_PROVIDER_SITE_OTHER): Payer: Medicare Other | Admitting: Internal Medicine

## 2016-10-11 ENCOUNTER — Encounter: Payer: Self-pay | Admitting: Internal Medicine

## 2016-10-11 VITALS — BP 100/68 | HR 60 | Temp 97.6°F | Ht 63.0 in | Wt 131.0 lb

## 2016-10-11 DIAGNOSIS — E039 Hypothyroidism, unspecified: Secondary | ICD-10-CM

## 2016-10-11 DIAGNOSIS — I10 Essential (primary) hypertension: Secondary | ICD-10-CM

## 2016-10-11 DIAGNOSIS — E785 Hyperlipidemia, unspecified: Secondary | ICD-10-CM | POA: Diagnosis not present

## 2016-10-11 MED ORDER — LOSARTAN POTASSIUM 50 MG PO TABS: ORAL_TABLET | ORAL | 3 refills | Status: DC

## 2016-10-11 MED ORDER — DOXAZOSIN MESYLATE 4 MG PO TABS: ORAL_TABLET | ORAL | 5 refills | Status: DC

## 2016-10-11 NOTE — Patient Instructions (Signed)
Stop indapamide and potassium. Reduce Losartan to 50 mg.

## 2016-10-11 NOTE — Progress Notes (Signed)
Facility  [facility]    Place of Service:   OFFICE    Allergies  Allergen Reactions  . Ambien [Zolpidem Tartrate]     hallucination  . Bactrim [Sulfamethoxazole-Trimethoprim]     Unknown reaction   . Morphine And Related Nausea And Vomiting  . Zithromax [Azithromycin]     Not effective      Chief Complaint  Patient presents with  . Medical Management of Chronic Issues    medication management blood pressure, thyroid, review labs  . eye    pressure too low per doctor  . potassium    wants to talk about the medication    HPI:  Nearly passed out at ophth. BP then was 89/56. She wants to cut back on some of her medications.  Less anxious since on sertraline. Still using some Xanax each day.  Lipids adequately controlled.  TSH high at 6.18. Normal on 04/11/17.  Medications: Patient's Medications  New Prescriptions   No medications on file  Previous Medications   ACETAMINOPHEN (TYLENOL) 500 MG TABLET    Take 500 mg by mouth every 4 (four) hours as needed.   ALPRAZOLAM (XANAX) 1 MG TABLET    TAKE 1 TABLET UP TO 3 TIMES DAILY FOR ANXIETY OR REST   ASPERCREME LIDOCAINE 4 % PTCH    Apply 1 patch topically 2 (two) times daily.   BEVACIZUMAB (AVASTIN) 100 MG/4ML SOLN    Inject monthly in right eye every 6 weeks.   CYANOCOBALAMIN (VITAMIN B-12 PO)    Take 1 tablet by mouth daily.   DOXAZOSIN (CARDURA) 8 MG TABLET    Take 1 tablet (8 mg total) by mouth daily.   INDAPAMIDE (LOZOL) 1.25 MG TABLET    TAKE 1 TABLET BY MOUTH DAILY   KETOROLAC (ACULAR) 0.5 % OPHTHALMIC SOLUTION    Place 1 drop into both eyes daily as needed (for eye pain).   KLOR-CON M20 20 MEQ TABLET    TAKE 1 TABLET (20 MEQ TOTAL) BY MOUTH DAILY. FOR POTASSIUM SUPPLEMENT   LEVOTHYROXINE (SYNTHROID, LEVOTHROID) 100 MCG TABLET    One daily for thyroid supplement   LORATADINE (CLARITIN) 10 MG TABLET    Take 10 mg by mouth daily.    LOSARTAN (COZAAR) 100 MG TABLET    Take 1 tablet (100 mg total) by mouth daily.   METOPROLOL SUCCINATE (TOPROL-XL) 100 MG 24 HR TABLET    TAKE 1 TABLET BY MOUTH EVERY DAY WITH A MEAL TO CONTROL BLOOD PRESSURE   MULTIPLE VITAMINS-MINERALS (CENTRUM SILVER) TABLET    Take 1 tablet by mouth daily.   PANTOPRAZOLE (PROTONIX) 40 MG TABLET    TAKE ONE TABLET BY MOUTH IN THE MORNING 30 MINUTES BEFORE BREAKFAST FOR STOMACH   POLYETHYLENE GLYCOL (MIRALAX / GLYCOLAX) PACKET    Take 17 g by mouth daily.    PROPYLENE GLYCOL (SYSTANE BALANCE) 0.6 % SOLN    Place 1 drop into both eyes 2 (two) times daily.   RANIBIZUMAB (LUCENTIS) 0.3 MG/0.05ML SOLN    Inject into left eye every 5 weeks.   SERTRALINE (ZOLOFT) 50 MG TABLET    TAKE 1 TABLET (50 MG TOTAL) BY MOUTH DAILY. TO CALM NERVES   TEMAZEPAM (RESTORIL) 30 MG CAPSULE    TAKE ONE CAPSULE BY MOUTH AT BEDTIME AS NEEDED FOR SLEEP  Modified Medications   No medications on file  Discontinued Medications   No medications on file    Review of Systems  Constitutional: Negative.  Negative for activity change.  Eyes: Positive for visual disturbance (Macular degeneration.).  Respiratory: Negative for cough, chest tightness, shortness of breath and wheezing.        History of asthma.  Cardiovascular: Negative for chest pain, palpitations and leg swelling.  Gastrointestinal: Positive for constipation.       History of hemorrhoids. Abdominal bloating, gas, and rumbling have improved on pantoprazole.  Endocrine: Positive for polyuria.       History hypothyroidism  Genitourinary: Positive for menstrual problem. Negative for flank pain, frequency, hematuria and urgency.       History kidney stones at age 33. Doing Kegel's exercises.  Musculoskeletal: Positive for arthralgias, back pain and gait problem. Negative for myalgias.  Skin: Negative.   Allergic/Immunologic: Negative.   Neurological: Negative for dizziness, light-headedness and headaches.       Tingling and numbness in feet. Has had episodes of numbness in the right fifth finger and in the  right hand.  Hematological: Negative.   Psychiatric/Behavioral: Positive for sleep disturbance. Negative for confusion, decreased concentration and dysphoric mood. The patient is nervous/anxious.     Vitals:   10/11/16 1339  BP: 100/68  Pulse: 60  Temp: 97.6 F (36.4 C)  TempSrc: Oral  SpO2: 95%  Weight: 131 lb (59.4 kg)  Height: 5' 3"  (1.6 m)   Body mass index is 23.21 kg/m. Wt Readings from Last 3 Encounters:  10/11/16 131 lb (59.4 kg)  10/05/16 128 lb 6.4 oz (58.2 kg)  09/22/16 128 lb (58.1 kg)      Physical Exam  Constitutional: She is oriented to person, place, and time. She appears well-nourished. No distress.  HENT:  Head: Normocephalic and atraumatic.  Right Ear: External ear normal.  Left Ear: External ear normal.  Nose: Nose normal.  Mouth/Throat: Oropharynx is clear and moist.  Partial hearing loss in both ears.  Eyes: Conjunctivae and EOM are normal. Pupils are equal, round, and reactive to light.  Corrective lenses.  Cardiovascular: Normal rate, regular rhythm, normal heart sounds and intact distal pulses.  Exam reveals no gallop and no friction rub.   No murmur heard. Pulmonary/Chest: No respiratory distress. She has no wheezes. She has no rales.  Abdominal: Bowel sounds are normal. She exhibits no distension and no mass. There is no tenderness.  Musculoskeletal: Normal range of motion. She exhibits tenderness (lower back and knees). She exhibits no edema.  Neurological: She is alert and oriented to person, place, and time. She has normal reflexes. No cranial nerve deficit. Coordination normal.  10/28/14 MMSE 29/30. Passed clock drawing. Numbness in the feet has progressed from the toes to about halfway up the foot. She thinks this may impair her gait.  Skin: No rash noted. No erythema. No pallor.  Multiple seborrheic keratoses.  Psychiatric: Her behavior is normal. Judgment and thought content normal.  Anxious and possibly depressed.    Labs  reviewed: Lab Summary Latest Ref Rng & Units 10/05/2016 09/06/2016 08/16/2016 08/04/2016 08/03/2016  Hemoglobin 11.7 - 15.5 g/dL 12.6 (None) (None) (None) 11.9(L)  Hematocrit 35.0 - 45.0 % 37.9 (None) (None) (None) 35.1(L)  White count 3.8 - 10.8 K/uL 4.3 (None) (None) (None) 2.9(L)  Platelet count 140 - 400 K/uL 146 (None) (None) (None) 113(L)  Sodium 135 - 146 mmol/L 132(L) 133(L) 131(L) 129(L) 132(L)  Potassium 3.5 - 5.3 mmol/L 3.7 4.0 3.9 3.1(L) 3.4(L)  Calcium 8.6 - 10.4 mg/dL 9.2 9.4 9.7 8.3(L) 8.1(L)  Phosphorus - (None) (None) (None) (None) (None)  Creatinine 0.60 - 0.88 mg/dL 0.73 0.68 0.72 0.61  0.53  AST - (None) (None) (None) (None) (None)  Alk Phos - (None) (None) (None) (None) (None)  Bilirubin - (None) (None) (None) (None) (None)  Glucose 65 - 99 mg/dL 93 118(H) 91 99 92  Cholesterol <200 mg/dL 189 (None) (None) (None) (None)  HDL cholesterol >50 mg/dL 56 (None) (None) (None) (None)  Triglycerides <150 mg/dL 118 (None) (None) (None) (None)  LDL Direct - (None) (None) (None) (None) (None)  LDL Calc <100 mg/dL 109(H) (None) (None) (None) (None)  Total protein - (None) (None) (None) (None) (None)  Albumin - (None) (None) (None) (None) (None)  Some recent data might be hidden   Lab Results  Component Value Date   TSH 6.18 (H) 10/05/2016   TSH 3.39 04/11/2016   TSH 11.460 (H) 12/04/2015   T4TOTAL 9.2 04/29/2013   Lab Results  Component Value Date   BUN 10 10/05/2016   BUN 14 09/06/2016   BUN 14 08/16/2016   Lab Results  Component Value Date   HGBA1C  07/23/2007    5.7 (NOTE)   The ADA recommends the following therapeutic goals for glycemic   control related to Hgb A1C measurement:   Goal of Therapy:   < 7.0% Hgb A1C   Action Suggested:  > 8.0% Hgb A1C   Ref:  Diabetes Care, 22, Suppl. 1, 1999    Assessment/Plan 1. Essential hypertension Stop indapamide and KCl.  Continue metoprolol - doxazosin (CARDURA) 4 MG tablet; One daily to help control BP  Dispense: 30  tablet; Refill: 5 - losartan (COZAAR) 50 MG tablet; One daily to control BP  Dispense: 90 tablet; Refill: 3  2. Hypothyroidism, unspecified type Continue current medicaton  3. Hyperlipidemia, unspecified hyperlipidemia type Adequately controlled

## 2016-10-12 ENCOUNTER — Other Ambulatory Visit: Payer: Self-pay | Admitting: Internal Medicine

## 2016-10-12 DIAGNOSIS — I10 Essential (primary) hypertension: Secondary | ICD-10-CM

## 2016-10-12 MED ORDER — LOSARTAN POTASSIUM 50 MG PO TABS: ORAL_TABLET | ORAL | 3 refills | Status: DC

## 2016-10-14 ENCOUNTER — Other Ambulatory Visit: Payer: Self-pay | Admitting: Nurse Practitioner

## 2016-10-14 ENCOUNTER — Other Ambulatory Visit: Payer: Self-pay | Admitting: Internal Medicine

## 2016-10-14 DIAGNOSIS — I10 Essential (primary) hypertension: Secondary | ICD-10-CM

## 2016-10-16 ENCOUNTER — Other Ambulatory Visit: Payer: Self-pay | Admitting: Internal Medicine

## 2016-10-24 ENCOUNTER — Other Ambulatory Visit: Payer: Self-pay | Admitting: Internal Medicine

## 2016-10-25 DIAGNOSIS — H353221 Exudative age-related macular degeneration, left eye, with active choroidal neovascularization: Secondary | ICD-10-CM | POA: Diagnosis not present

## 2016-11-03 ENCOUNTER — Ambulatory Visit (INDEPENDENT_AMBULATORY_CARE_PROVIDER_SITE_OTHER): Payer: Medicare Other | Admitting: Nurse Practitioner

## 2016-11-03 VITALS — BP 170/76 | HR 62 | Temp 97.6°F

## 2016-11-03 DIAGNOSIS — I10 Essential (primary) hypertension: Secondary | ICD-10-CM

## 2016-11-03 MED ORDER — LOSARTAN POTASSIUM 100 MG PO TABS: ORAL_TABLET | ORAL | Status: DC

## 2016-11-03 NOTE — Progress Notes (Signed)
    Patient is being seen for nurse visit to check blood pressure. Patient stated that Dr. Chilton SiGreen recently lowered most of patient's blood pressure medications and patient states that her blood pressure has been higher than normal the last few days with home blood pressure readings of 150/75 to 170/82. Patient's blood pressure today was 170/76. Shanda BumpsJessica was informed of blood pressure reading.

## 2016-11-03 NOTE — Progress Notes (Signed)
Careteam: Patient Care Team: Kimber RelicArthur G Green, MD as PCP - General (Internal Medicine) Edmon CrapeGary A Rankin, MD as Consulting Physician (Ophthalmology) Donzetta Starchrew Jones, MD as Consulting Physician (Dermatology) Hart Carwinora M Brodie, MD (Inactive) as Consulting Physician (Gastroenterology)  Advanced Directive information Does Patient Have a Medical Advance Directive?: Yes, Type of Advance Directive: Living will  Allergies  Allergen Reactions  . Ambien [Zolpidem Tartrate]     hallucination  . Bactrim [Sulfamethoxazole-Trimethoprim]     Unknown reaction   . Morphine And Related Nausea And Vomiting  . Zithromax [Azithromycin]     Not effective      Chief Complaint  Patient presents with  . Follow-up    Pt is here for nurse visit to check BP     HPI: Patient is a 83 y.o. female seen in the office today for blood pressure check. [name] was seen on 2/27 and had hypotension by Dr Chilton SiGreen, indapamide and KCL was stopped, cozaar was decreased to 50 mg daily and cardura was decreased to 4 mg daily Taking blood pressure at home it has been similar to blood pressure in office today. No side effects from blood pressure being elevated.   Review of Systems:  Review of Systems  Constitutional: Negative.  Negative for activity change.  Eyes: Positive for visual disturbance (Macular degeneration.).  Respiratory: Negative for cough, chest tightness, shortness of breath and wheezing.        History of asthma.  Cardiovascular: Negative for chest pain, palpitations and leg swelling.  Gastrointestinal:       History of hemorrhoids.  Endocrine: Positive for polyuria.       History hypothyroidism  Skin: Negative.   Allergic/Immunologic: Negative.   Neurological: Negative for dizziness, light-headedness and headaches.  Hematological: Negative.   Psychiatric/Behavioral: Negative for confusion, decreased concentration and dysphoric mood. The patient is nervous/anxious.     Past Medical History:  Diagnosis Date   . Allergic rhinitis due to pollen   . Anxiety state, unspecified   . Calculus of kidney   . Cataract   . Diaphragmatic hernia without mention of obstruction or gangrene   . Diffuse cystic mastopathy   . Dizziness and giddiness   . GERD (gastroesophageal reflux disease)   . Hiatal hernia   . Insomnia, unspecified   . Insomnia, unspecified   . Lumbago   . Macular degeneration (senile) of retina, unspecified   . Open wound of toe(s), without mention of complication   . Osteoarthrosis, unspecified whether generalized or localized, unspecified site   . Other and unspecified hyperlipidemia   . Personal history of fall   . Sciatica   . Sebaceous cyst   . Senile osteoporosis   . Unspecified constipation   . Unspecified essential hypertension   . Unspecified hereditary and idiopathic peripheral neuropathy   . Unspecified hypothyroidism   . Unspecified tinnitus    Past Surgical History:  Procedure Laterality Date  . CATARACT EXTRACTION, BILATERAL    . CHOLECYSTECTOMY  07/25/2007  . TONSILLECTOMY AND ADENOIDECTOMY Bilateral 1943   Social History:   reports that she has never smoked. She has never used smokeless tobacco. She reports that she does not drink alcohol or use drugs.  Family History  Problem Relation Age of Onset  . Hypertension Brother   . Fibromyalgia Daughter   . Hypertension Son   . Hypertension Brother   . Hyperlipidemia Brother     Medications: Patient's Medications  New Prescriptions   No medications on file  Previous Medications  ACETAMINOPHEN (TYLENOL) 500 MG TABLET    Take 500 mg by mouth every 4 (four) hours as needed.   ALPRAZOLAM (XANAX) 1 MG TABLET    TAKE 1 TABLET UP TO 3 TIMES DAILY FOR ANXIETY OR REST   BEVACIZUMAB (AVASTIN) 100 MG/4ML SOLN    Inject monthly in left  eye every 6 weeks.   CYANOCOBALAMIN (VITAMIN B-12 PO)    Take 1 tablet by mouth daily.   DOXAZOSIN (CARDURA) 4 MG TABLET    One daily to help control BP   KETOROLAC (ACULAR) 0.5 %  OPHTHALMIC SOLUTION    Place 1 drop into both eyes daily as needed (for eye pain).   LEVOTHYROXINE (SYNTHROID, LEVOTHROID) 100 MCG TABLET    One daily for thyroid supplement   LORATADINE (CLARITIN) 10 MG TABLET    Take 10 mg by mouth daily.    LOSARTAN (COZAAR) 50 MG TABLET    One daily to control BP   METOPROLOL SUCCINATE (TOPROL-XL) 100 MG 24 HR TABLET    TAKE 1 TABLET BY MOUTH EVERY DAY WITH A MEAL TO CONTROL BLOOD PRESSURE   MULTIPLE VITAMINS-MINERALS (CENTRUM SILVER) TABLET    Take 1 tablet by mouth daily.   PANTOPRAZOLE (PROTONIX) 40 MG TABLET    TAKE ONE TABLET BY MOUTH IN THE MORNING 30 MINUTES BEFORE BREAKFAST FOR STOMACH   POLYETHYLENE GLYCOL (MIRALAX / GLYCOLAX) PACKET    Take 17 g by mouth daily.    PROPYLENE GLYCOL (SYSTANE BALANCE) 0.6 % SOLN    Place 1 drop into both eyes 2 (two) times daily.   RANIBIZUMAB (LUCENTIS) 0.3 MG/0.05ML SOLN    Inject into left eye every 5 weeks.   SERTRALINE (ZOLOFT) 50 MG TABLET    TAKE 1 TABLET (50 MG TOTAL) BY MOUTH DAILY. TO CALM NERVES   TEMAZEPAM (RESTORIL) 30 MG CAPSULE    TAKE ONE CAPSULE BY MOUTH AT BEDTIME AS NEEDED FOR SLEEP  Modified Medications   No medications on file  Discontinued Medications   ASPERCREME LIDOCAINE 4 % PTCH    Apply 1 patch topically 2 (two) times daily.     Physical Exam:  Vitals:   11/03/16 1016  BP: (!) 170/76  Pulse: 62  Temp: 97.6 F (36.4 C)  TempSrc: Oral   There is no height or weight on file to calculate BMI.  Physical Exam  Constitutional: She is oriented to person, place, and time. She appears well-nourished. No distress.  HENT:  Head: Normocephalic and atraumatic.  Right Ear: External ear normal.  Left Ear: External ear normal.  Nose: Nose normal.  Mouth/Throat: Oropharynx is clear and moist.  Partial hearing loss in both ears.  Eyes: Conjunctivae and EOM are normal. Pupils are equal, round, and reactive to light.  Corrective lenses.  Cardiovascular: Normal rate, regular rhythm and normal  heart sounds.   Pulmonary/Chest: Effort normal and breath sounds normal.  Abdominal: Soft. Bowel sounds are normal.  Musculoskeletal: Normal range of motion. She exhibits no edema.  Neurological: She is alert and oriented to person, place, and time.   Labs reviewed: Basic Metabolic Panel:  Recent Labs  40/98/11 1008  04/11/16 1011  08/02/16 1759  08/16/16 1145 09/06/16 1557 10/05/16 0957  NA 135  --  137  < >  --   < > 131* 133* 132*  K 3.7  --  4.1  < >  --   < > 3.9 4.0 3.7  CL 90*  --  94*  < >  --   < >  90* 93* 90*  CO2 30*  --  33*  < >  --   < > 35* 34* 34*  GLUCOSE 82  --  88  < >  --   < > 91 118* 93  BUN 9  --  12  < >  --   < > 14 14 10   CREATININE 0.67  < > 0.74  < >  --   < > 0.72 0.68 0.73  CALCIUM 9.3  --  9.5  < >  --   < > 9.7 9.4 9.2  MG  --   --   --   --  1.5*  --   --   --   --   PHOS  --   --   --   --  2.1*  --   --   --   --   TSH 11.460*  --  3.39  --   --   --   --   --  6.18*  < > = values in this interval not displayed. Liver Function Tests:  Recent Labs  04/11/16 1011 08/02/16 1047 08/02/16 1759  AST 17 19 21   ALT 12 13* 13*  ALKPHOS 79 72 68  BILITOT 0.4 0.6 0.6  PROT 6.1 6.1* 5.9*  ALBUMIN 3.8 3.4* 3.5   No results for input(s): LIPASE, AMYLASE in the last 8760 hours. No results for input(s): AMMONIA in the last 8760 hours. CBC:  Recent Labs  08/02/16 1016 08/03/16 0520 10/05/16 0957  WBC 4.6 2.9* 4.3  NEUTROABS 3.3  --  3,225  HGB 13.3 11.9* 12.6  HCT 37.3 35.1* 37.9  MCV 81.1 85.2 85.4  PLT 108* 113* 146   Lipid Panel:  Recent Labs  04/11/16 1037 10/05/16 0957  CHOL 193 189  HDL 49 56  LDLCALC 113 109*  TRIG 155* 118  CHOLHDL 3.9 3.4   TSH:  Recent Labs  12/04/15 1008 04/11/16 1011 10/05/16 0957  TSH 11.460* 3.39 6.18*   A1C: Lab Results  Component Value Date   HGBA1C  07/23/2007    5.7 (NOTE)   The ADA recommends the following therapeutic goals for glycemic   control related to Hgb A1C measurement:    Goal of Therapy:   < 7.0% Hgb A1C   Action Suggested:  > 8.0% Hgb A1C   Ref:  Diabetes Care, 22, Suppl. 1, 1999     Assessment/Plan 1. Essential hypertension Blood pressure not controlled, home blood pressures similar to bp today -to cont cardura 4 mg, lopressor 100 mg daily and to increase cozaar to 100 mg daily at this time.  - losartan (COZAAR) 100 MG tablet; One daily to control BP  To keep follow up with DR Chilton Si next week.     [name]. [name]  [facility] [phone] 8 am - 5 pm) [phone] (after hours)

## 2016-11-03 NOTE — Patient Instructions (Signed)
Increase losartan to 100 mg daily-- keep all other medication as you are taking And follow up with Dr Chilton SiGreen as scheduled.

## 2016-11-09 ENCOUNTER — Ambulatory Visit (INDEPENDENT_AMBULATORY_CARE_PROVIDER_SITE_OTHER): Payer: Medicare Other | Admitting: Internal Medicine

## 2016-11-09 ENCOUNTER — Encounter: Payer: Self-pay | Admitting: Internal Medicine

## 2016-11-09 VITALS — BP 154/82 | HR 56 | Temp 97.7°F | Ht 63.0 in | Wt 129.0 lb

## 2016-11-09 DIAGNOSIS — G4700 Insomnia, unspecified: Secondary | ICD-10-CM | POA: Diagnosis not present

## 2016-11-09 DIAGNOSIS — Q670 Congenital facial asymmetry: Secondary | ICD-10-CM

## 2016-11-09 DIAGNOSIS — I10 Essential (primary) hypertension: Secondary | ICD-10-CM

## 2016-11-09 MED ORDER — DOXAZOSIN MESYLATE 8 MG PO TABS: ORAL_TABLET | ORAL | 4 refills | Status: DC

## 2016-11-09 MED ORDER — LOSARTAN POTASSIUM 100 MG PO TABS: ORAL_TABLET | ORAL | 5 refills | Status: DC

## 2016-11-09 MED ORDER — TEMAZEPAM 30 MG PO CAPS: 30.0000 mg | ORAL_CAPSULE | Freq: Every evening | ORAL | 1 refills | Status: DC | PRN

## 2016-11-09 NOTE — Progress Notes (Signed)
Facility  [facility]    Place of Service:   OFFICE    Allergies  Allergen Reactions  . Ambien [Zolpidem Tartrate]     hallucination  . Bactrim [Sulfamethoxazole-Trimethoprim]     Unknown reaction   . Morphine And Related Nausea And Vomiting  . Zithromax [Azithromycin]     Not effective      Chief Complaint  Patient presents with  . Medical Management of Chronic Issues    medication management of blood pressure, review medications    HPI:  Seen acutely on 11/03/16 for HTN by J. Eubanks. Recommended increase in Losartan to 100 mg qd. Had previously had lower BP so on 10/06/16, I had stopped indapamide and KCl, and reduced Cardura to 4 mg and reduced losartan to 50 mg.   Notes new facial asymmetry with wrinkles on the right and a smoothe cheek on the left.  Medications: Patient's Medications  New Prescriptions   No medications on file  Previous Medications   ACETAMINOPHEN (TYLENOL) 500 MG TABLET    Take 500 mg by mouth every 4 (four) hours as needed.   ALPRAZOLAM (XANAX) 1 MG TABLET    TAKE 1 TABLET UP TO 3 TIMES DAILY FOR ANXIETY OR REST   BEVACIZUMAB (AVASTIN) 100 MG/4ML SOLN    Inject monthly in left  eye every 6 weeks.   CYANOCOBALAMIN (VITAMIN B-12 PO)    Take 1 tablet by mouth daily.   DOXAZOSIN (CARDURA) 4 MG TABLET    One daily to help control BP   KETOROLAC (ACULAR) 0.5 % OPHTHALMIC SOLUTION    Place 1 drop into both eyes daily as needed (for eye pain).   LEVOTHYROXINE (SYNTHROID, LEVOTHROID) 100 MCG TABLET    One daily for thyroid supplement   LORATADINE (CLARITIN) 10 MG TABLET    Take 10 mg by mouth daily.    LOSARTAN (COZAAR) 100 MG TABLET    One daily to control BP   METOPROLOL SUCCINATE (TOPROL-XL) 100 MG 24 HR TABLET    TAKE 1 TABLET BY MOUTH EVERY DAY WITH A MEAL TO CONTROL BLOOD PRESSURE   MULTIPLE VITAMINS-MINERALS (CENTRUM SILVER) TABLET    Take 1 tablet by mouth daily.   PANTOPRAZOLE (PROTONIX) 40 MG TABLET    TAKE ONE TABLET BY MOUTH IN THE MORNING 30  MINUTES BEFORE BREAKFAST FOR STOMACH   POLYETHYLENE GLYCOL (MIRALAX / GLYCOLAX) PACKET    Take 17 g by mouth daily.    PROPYLENE GLYCOL (SYSTANE BALANCE) 0.6 % SOLN    Place 1 drop into both eyes 2 (two) times daily.   RANIBIZUMAB (LUCENTIS) 0.3 MG/0.05ML SOLN    Inject into left eye every 5 weeks.   SERTRALINE (ZOLOFT) 50 MG TABLET    TAKE 1 TABLET (50 MG TOTAL) BY MOUTH DAILY. TO CALM NERVES   TEMAZEPAM (RESTORIL) 30 MG CAPSULE    TAKE ONE CAPSULE BY MOUTH AT BEDTIME AS NEEDED FOR SLEEP  Modified Medications   No medications on file  Discontinued Medications   No medications on file    Review of Systems  Constitutional: Negative.  Negative for activity change.  Eyes: Positive for visual disturbance (Macular degeneration.).  Respiratory: Negative for cough, chest tightness, shortness of breath and wheezing.        History of asthma.  Cardiovascular: Negative for chest pain, palpitations and leg swelling.  Gastrointestinal: Positive for constipation.       History of hemorrhoids. Abdominal bloating, gas, and rumbling have improved on pantoprazole.  Endocrine: Positive  for polyuria.       History hypothyroidism  Genitourinary: Positive for menstrual problem. Negative for flank pain, frequency, hematuria and urgency.       History kidney stones at age 39. Doing Kegel's exercises.  Musculoskeletal: Positive for arthralgias, back pain and gait problem. Negative for myalgias.  Skin: Negative.   Allergic/Immunologic: Negative.   Neurological: Negative for dizziness, light-headedness and headaches.       Tingling and numbness in feet. Has had episodes of numbness in the right fifth finger and in the right hand.  Hematological: Negative.   Psychiatric/Behavioral: Positive for sleep disturbance. Negative for confusion, decreased concentration and dysphoric mood. The patient is nervous/anxious.     Vitals:   11/09/16 1109  BP: (!) 154/82  Pulse: (!) 56  Temp: 97.7 F (36.5 C)  TempSrc:  Oral  SpO2: 96%  Weight: 129 lb (58.5 kg)  Height: 5' 3"  (1.6 m)   Body mass index is 22.85 kg/m. Wt Readings from Last 3 Encounters:  11/09/16 129 lb (58.5 kg)  10/11/16 131 lb (59.4 kg)  10/05/16 128 lb 6.4 oz (58.2 kg)      Physical Exam  Constitutional: She is oriented to person, place, and time. She appears well-nourished. No distress.  HENT:  Head: Normocephalic and atraumatic.  Right Ear: External ear normal.  Left Ear: External ear normal.  Nose: Nose normal.  Mouth/Throat: Oropharynx is clear and moist.  Partial hearing loss in both ears. Facial asymmetry with wrinkles on the right cheek.  Eyes: Conjunctivae and EOM are normal. Pupils are equal, round, and reactive to light.  Corrective lenses.  Cardiovascular: Normal rate, regular rhythm, normal heart sounds and intact distal pulses.  Exam reveals no gallop and no friction rub.   No murmur heard. Pulmonary/Chest: No respiratory distress. She has no wheezes. She has no rales.  Abdominal: Bowel sounds are normal. She exhibits no distension and no mass. There is no tenderness.  Musculoskeletal: Normal range of motion. She exhibits tenderness (lower back and knees). She exhibits no edema.  Neurological: She is alert and oriented to person, place, and time. She has normal reflexes. No cranial nerve deficit. Coordination normal.  10/28/14 MMSE 29/30. Passed clock drawing. Numbness in the feet has progressed from the toes to about halfway up the foot. She thinks this may impair her gait.  Skin: No rash noted. No erythema. No pallor.  Multiple seborrheic keratoses.  Psychiatric: Her behavior is normal. Judgment and thought content normal.  Anxious and possibly depressed.    Labs reviewed: Lab Summary Latest Ref Rng & Units 10/05/2016 09/06/2016 08/16/2016 08/04/2016 08/03/2016  Hemoglobin 11.7 - 15.5 g/dL 12.6 (None) (None) (None) 11.9(L)  Hematocrit 35.0 - 45.0 % 37.9 (None) (None) (None) 35.1(L)  White count 3.8 - 10.8  K/uL 4.3 (None) (None) (None) 2.9(L)  Platelet count 140 - 400 K/uL 146 (None) (None) (None) 113(L)  Sodium 135 - 146 mmol/L 132(L) 133(L) 131(L) 129(L) 132(L)  Potassium 3.5 - 5.3 mmol/L 3.7 4.0 3.9 3.1(L) 3.4(L)  Calcium 8.6 - 10.4 mg/dL 9.2 9.4 9.7 8.3(L) 8.1(L)  Phosphorus - (None) (None) (None) (None) (None)  Creatinine 0.60 - 0.88 mg/dL 0.73 0.68 0.72 0.61 0.53  AST - (None) (None) (None) (None) (None)  Alk Phos - (None) (None) (None) (None) (None)  Bilirubin - (None) (None) (None) (None) (None)  Glucose 65 - 99 mg/dL 93 118(H) 91 99 92  Cholesterol <200 mg/dL 189 (None) (None) (None) (None)  HDL cholesterol >50 mg/dL 56 (None) (None) (None) (  None)  Triglycerides <150 mg/dL 118 (None) (None) (None) (None)  LDL Direct - (None) (None) (None) (None) (None)  LDL Calc <100 mg/dL 109(H) (None) (None) (None) (None)  Total protein - (None) (None) (None) (None) (None)  Albumin - (None) (None) (None) (None) (None)  Some recent data might be hidden   Lab Results  Component Value Date   TSH 6.18 (H) 10/05/2016   TSH 3.39 04/11/2016   TSH 11.460 (H) 12/04/2015   T4TOTAL 9.2 04/29/2013   Lab Results  Component Value Date   BUN 10 10/05/2016   BUN 14 09/06/2016   BUN 14 08/16/2016   Lab Results  Component Value Date   HGBA1C  07/23/2007    5.7 (NOTE)   The ADA recommends the following therapeutic goals for glycemic   control related to Hgb A1C measurement:   Goal of Therapy:   < 7.0% Hgb A1C   Action Suggested:  > 8.0% Hgb A1C   Ref:  Diabetes Care, 22, Suppl. 1, 1999    Assessment/Plan  1. Essential hypertension Continue metoprolol - Continue losartan (COZAAR) 100 MG tablet; One daily to control BP  Dispense: 90 tablet; Refill: 5 - Increase doxazosin (CARDURA) 8 MG tablet; One daily to control BP  Dispense: 90 tablet; Refill: 4  2. Insomnia, unspecified type - temazepam (RESTORIL) 30 MG capsule; Take 1 capsule (30 mg total) by mouth at bedtime as needed. for sleep  Dispense:  90 capsule; Refill: 1

## 2016-11-17 ENCOUNTER — Other Ambulatory Visit: Payer: Self-pay | Admitting: Internal Medicine

## 2016-11-17 ENCOUNTER — Telehealth: Payer: Self-pay | Admitting: Internal Medicine

## 2016-11-17 NOTE — Telephone Encounter (Signed)
Patient came into office with a medication question about Indapamide 1.25 mg . She wanted to know if she should be placed back on it and that she only has four pills left. Her blood pressure keeps going up without it.  Her pharmacy is [facility] in [place]   She has had no symptoms from her blood pressure.   She stated a few of her blood pressure readings; her last reading yesterday (11/16/16) was 197/105 .Today when she woke up it was 188/90, at 1:30pm it was 138/88 (after taking Indapamide) and at 3:01 it was 163/88

## 2016-11-18 ENCOUNTER — Other Ambulatory Visit: Payer: Self-pay | Admitting: Internal Medicine

## 2016-11-18 DIAGNOSIS — I10 Essential (primary) hypertension: Secondary | ICD-10-CM

## 2016-11-18 MED ORDER — INDAPAMIDE 1.25 MG PO TABS: ORAL_TABLET | ORAL | 3 refills | Status: DC

## 2016-11-18 NOTE — Telephone Encounter (Signed)
Patient's husband aware rx sent

## 2016-11-18 NOTE — Telephone Encounter (Signed)
I sent the prescription to resume indapamide. Please call her.

## 2016-11-21 ENCOUNTER — Ambulatory Visit
Admission: RE | Admit: 2016-11-21 | Discharge: 2016-11-21 | Disposition: A | Discharge status: Home / Self Care | Payer: Medicare Other | Source: Ambulatory Visit | Attending: Internal Medicine | Admitting: Internal Medicine

## 2016-11-21 DIAGNOSIS — Z1231 Encounter for screening mammogram for malignant neoplasm of breast: Secondary | ICD-10-CM | POA: Diagnosis not present

## 2016-11-22 ENCOUNTER — Telehealth: Payer: Self-pay | Admitting: *Deleted

## 2016-11-22 NOTE — Telephone Encounter (Signed)
Noted.  No new orders

## 2016-11-22 NOTE — Telephone Encounter (Signed)
Patient called and just wanted to let you know that she thinks she had a TIA Friday Morning due to slurred speech and sight not good and confusion. Patient stated that it didn't last long but she knew that this is what it was because it happened to her 20 years ago.  Patient stated that her BP is 138/74. But last Thursday it was high at [facility], took her medicine and it came down and been fine since. No headaches.

## 2016-11-29 DIAGNOSIS — H353124 Nonexudative age-related macular degeneration, left eye, advanced atrophic with subfoveal involvement: Secondary | ICD-10-CM | POA: Diagnosis not present

## 2016-11-29 DIAGNOSIS — H353221 Exudative age-related macular degeneration, left eye, with active choroidal neovascularization: Secondary | ICD-10-CM | POA: Diagnosis not present

## 2016-11-29 DIAGNOSIS — H353113 Nonexudative age-related macular degeneration, right eye, advanced atrophic without subfoveal involvement: Secondary | ICD-10-CM | POA: Diagnosis not present

## 2016-12-07 ENCOUNTER — Ambulatory Visit (INDEPENDENT_AMBULATORY_CARE_PROVIDER_SITE_OTHER): Payer: Medicare Other | Admitting: Internal Medicine

## 2016-12-07 ENCOUNTER — Encounter: Payer: Self-pay | Admitting: Internal Medicine

## 2016-12-07 VITALS — BP 140/76 | HR 57 | Temp 98.2°F | Ht 63.0 in | Wt 129.0 lb

## 2016-12-07 DIAGNOSIS — I10 Essential (primary) hypertension: Secondary | ICD-10-CM

## 2016-12-07 NOTE — Progress Notes (Signed)
Facility  [place]    Place of Service:   OFFICE    Allergies  Allergen Reactions  . Ambien [Zolpidem Tartrate]     hallucination  . Bactrim [Sulfamethoxazole-Trimethoprim]     Unknown reaction   . Morphine And Related Nausea And Vomiting  . Zithromax [Azithromycin]     Not effective      Chief Complaint  Patient presents with  . Medical Management of Chronic Issues    4 week follow up on blood pressure. Review labs    HPI:  Seen 11/03/16 and 11/09/16 for HTN. I increased the Cardura to 8 mg qd on 11/09/16. Continued on losartan 100 mg qd and metoprolol 100 mg qd. She resumed the indapamide.  When BP was high she was eating at [employer] and speech was slurred. Had some trouble remembering words. Had a similar episode abut 30 years ago. Had visual disturbance at that time. She thinks she should go back on ASA 81 mg qd.  Medications: Patient's Medications  New Prescriptions   No medications on file  Previous Medications   ACETAMINOPHEN (TYLENOL) 500 MG TABLET    Take 500 mg by mouth every 4 (four) hours as needed.   ALPRAZOLAM (XANAX) 1 MG TABLET    TAKE 1 TABLET BY MOUTH UP TO 3 TIMES DAILY FOR ANXIETY/REST   BEVACIZUMAB (AVASTIN) 100 MG/4ML SOLN    Inject monthly in left  eye every 6 weeks.   CYANOCOBALAMIN (VITAMIN B-12 PO)    Take 1 tablet by mouth daily. Vitamin B-12   DOXAZOSIN (CARDURA) 8 MG TABLET    One daily to control BP   INDAPAMIDE (LOZOL) 1.25 MG TABLET    One daily to help control BP   KETOROLAC (ACULAR) 0.5 % OPHTHALMIC SOLUTION    Place 1 drop into both eyes daily as needed (for eye pain).   LEVOTHYROXINE (SYNTHROID, LEVOTHROID) 100 MCG TABLET    One daily for thyroid supplement   LORATADINE (CLARITIN) 10 MG TABLET    Take 10 mg by mouth daily.    LOSARTAN (COZAAR) 100 MG TABLET    One daily to control BP   METOPROLOL SUCCINATE (TOPROL-XL) 100 MG 24 HR TABLET    TAKE 1 TABLET BY MOUTH EVERY DAY WITH A MEAL TO CONTROL BLOOD PRESSURE   MULTIPLE  VITAMINS-MINERALS (CENTRUM SILVER) TABLET    Take 1 tablet by mouth daily.   PANTOPRAZOLE (PROTONIX) 40 MG TABLET    TAKE ONE TABLET BY MOUTH IN THE MORNING 30 MINUTES BEFORE BREAKFAST FOR STOMACH   POLYETHYLENE GLYCOL (MIRALAX / GLYCOLAX) PACKET    Take 17 g by mouth daily.    PROPYLENE GLYCOL (SYSTANE BALANCE) 0.6 % SOLN    Place 1 drop into both eyes 2 (two) times daily.   RANIBIZUMAB (LUCENTIS) 0.3 MG/0.05ML SOLN    Inject into left eye every 5 weeks.   SERTRALINE (ZOLOFT) 50 MG TABLET    TAKE 1 TABLET (50 MG TOTAL) BY MOUTH DAILY. TO CALM NERVES   TEMAZEPAM (RESTORIL) 30 MG CAPSULE    Take 1 capsule (30 mg total) by mouth at bedtime as needed. for sleep  Modified Medications   No medications on file  Discontinued Medications   No medications on file    Review of Systems  Constitutional: Negative.  Negative for activity change.  Eyes: Positive for visual disturbance (Macular degeneration.).  Respiratory: Negative for cough, chest tightness, shortness of breath and wheezing.        History of asthma.  Cardiovascular: Positive for palpitations. Negative for chest pain and leg swelling.  Gastrointestinal: Positive for constipation.       History of hemorrhoids. Abdominal bloating, gas, and rumbling have improved on pantoprazole.  Endocrine: Positive for polyuria.       History hypothyroidism  Genitourinary: Positive for menstrual problem. Negative for flank pain, frequency, hematuria and urgency.       History kidney stones at age 28. Doing Kegel's exercises.  Musculoskeletal: Positive for arthralgias, back pain and gait problem. Negative for myalgias.  Skin: Negative.   Allergic/Immunologic: Negative.   Neurological: Negative for dizziness, light-headedness and headaches.       Tingling and numbness in feet. Has had episodes of numbness in the right fifth finger and in the right hand.  Hematological: Negative.   Psychiatric/Behavioral: Positive for sleep disturbance. Negative for  confusion, decreased concentration and dysphoric mood. The patient is nervous/anxious.     Vitals:   12/07/16 1341  BP: 140/76  Pulse: (!) 57  Temp: 98.2 F (36.8 C)  TempSrc: Oral  SpO2: 96%  Weight: 129 lb (58.5 kg)  Height: _0  (1.6 m)   Body mass index is 22.85 kg/m. Wt Readings from Last 3 Encounters:  12/07/16 129 lb (58.5 kg)  11/09/16 129 lb (58.5 kg)  10/11/16 131 lb (59.4 kg)      Physical Exam  Constitutional: She is oriented to person, place, and time. She appears well-nourished. No distress.  HENT:  Head: Normocephalic and atraumatic.  Right Ear: External ear normal.  Left Ear: External ear normal.  Nose: Nose normal.  Mouth/Throat: Oropharynx is clear and moist.  Partial hearing loss in both ears. Facial asymmetry with wrinkles on the right cheek.  Eyes: Conjunctivae and EOM are normal. Pupils are equal, round, and reactive to light.  Corrective lenses.  Cardiovascular: Normal rate, regular rhythm, normal heart sounds and intact distal pulses.  Exam reveals no gallop and no friction rub.   No murmur heard. Pulmonary/Chest: No respiratory distress. She has no wheezes. She has no rales.  Abdominal: Bowel sounds are normal. She exhibits no distension and no mass. There is no tenderness.  Musculoskeletal: Normal range of motion. She exhibits tenderness (lower back and knees). She exhibits no edema.  Neurological: She is alert and oriented to person, place, and time. She has normal reflexes. No cranial nerve deficit. Coordination normal.  10/28/14 MMSE 29/30. Passed clock drawing. Numbness in the feet has progressed from the toes to about halfway up the foot. She thinks this may impair her gait.  Skin: No rash noted. No erythema. No pallor.  Multiple seborrheic keratoses.  Psychiatric: Her behavior is normal. Judgment and thought content normal.  Anxious and possibly depressed.    Labs reviewed: Lab Summary Latest Ref Rng & Units 10/05/2016 09/06/2016  08/16/2016 08/04/2016 08/03/2016  Hemoglobin 11.7 - 15.5 g/dL 12.6 (None) (None) (None) 11.9(L)  Hematocrit 35.0 - 45.0 % 37.9 (None) (None) (None) 35.1(L)  White count 3.8 - 10.8 K/uL 4.3 (None) (None) (None) 2.9(L)  Platelet count 140 - 400 K/uL 146 (None) (None) (None) 113(L)  Sodium 135 - 146 mmol/L 132(L) 133(L) 131(L) 129(L) 132(L)  Potassium 3.5 - 5.3 mmol/L 3.7 4.0 3.9 3.1(L) 3.4(L)  Calcium 8.6 - 10.4 mg/dL 9.2 9.4 9.7 8.3(L) 8.1(L)  Phosphorus - (None) (None) (None) (None) (None)  Creatinine 0.60 - 0.88 mg/dL 0.73 0.68 0.72 0.61 0.53  AST - (None) (None) (None) (None) (None)  Alk Phos - (None) (None) (None) (None) (None)  Bilirubin - (  None) (None) (None) (None) (None)  Glucose 65 - 99 mg/dL 93 118(H) 91 99 92  Cholesterol <200 mg/dL 189 (None) (None) (None) (None)  HDL cholesterol >50 mg/dL 56 (None) (None) (None) (None)  Triglycerides <150 mg/dL 118 (None) (None) (None) (None)  LDL Direct - (None) (None) (None) (None) (None)  LDL Calc <100 mg/dL 109(H) (None) (None) (None) (None)  Total protein - (None) (None) (None) (None) (None)  Albumin - (None) (None) (None) (None) (None)  Some recent data might be hidden   Lab Results  Component Value Date   TSH 6.18 (H) 10/05/2016   TSH 3.39 04/11/2016   TSH 11.460 (H) 12/04/2015   T4TOTAL 9.2 04/29/2013   Lab Results  Component Value Date   BUN 10 10/05/2016   BUN 14 09/06/2016   BUN 14 08/16/2016   Lab Results  Component Value Date   HGBA1C  07/23/2007    5.7 (NOTE)   The ADA recommends the following therapeutic goals for glycemic   control related to Hgb A1C measurement:   Goal of Therapy:   < 7.0% Hgb A1C   Action Suggested:  > 8.0% Hgb A1C   Ref:  Diabetes Care, 22, Suppl. 1, 1999    Assessment/Plan  1. Essential hypertension Continue all current medcations

## 2016-12-27 ENCOUNTER — Other Ambulatory Visit: Payer: Self-pay | Admitting: Internal Medicine

## 2017-01-03 DIAGNOSIS — H353222 Exudative age-related macular degeneration, left eye, with inactive choroidal neovascularization: Secondary | ICD-10-CM | POA: Diagnosis not present

## 2017-01-03 DIAGNOSIS — H353124 Nonexudative age-related macular degeneration, left eye, advanced atrophic with subfoveal involvement: Secondary | ICD-10-CM | POA: Diagnosis not present

## 2017-02-01 DIAGNOSIS — H353124 Nonexudative age-related macular degeneration, left eye, advanced atrophic with subfoveal involvement: Secondary | ICD-10-CM | POA: Diagnosis not present

## 2017-02-01 DIAGNOSIS — R443 Hallucinations, unspecified: Secondary | ICD-10-CM | POA: Diagnosis not present

## 2017-02-01 DIAGNOSIS — H353222 Exudative age-related macular degeneration, left eye, with inactive choroidal neovascularization: Secondary | ICD-10-CM | POA: Diagnosis not present

## 2017-02-01 DIAGNOSIS — H5316 Psychophysical visual disturbances: Secondary | ICD-10-CM | POA: Diagnosis not present

## 2017-02-10 ENCOUNTER — Encounter: Payer: Self-pay | Admitting: Internal Medicine

## 2017-02-10 ENCOUNTER — Ambulatory Visit (INDEPENDENT_AMBULATORY_CARE_PROVIDER_SITE_OTHER): Payer: Medicare Other | Admitting: Internal Medicine

## 2017-02-10 VITALS — BP 138/70 | HR 60 | Temp 97.7°F | Wt 125.0 lb

## 2017-02-10 DIAGNOSIS — E039 Hypothyroidism, unspecified: Secondary | ICD-10-CM | POA: Diagnosis not present

## 2017-02-10 DIAGNOSIS — F411 Generalized anxiety disorder: Secondary | ICD-10-CM

## 2017-02-10 DIAGNOSIS — E876 Hypokalemia: Secondary | ICD-10-CM | POA: Diagnosis not present

## 2017-02-10 DIAGNOSIS — I10 Essential (primary) hypertension: Secondary | ICD-10-CM

## 2017-02-10 DIAGNOSIS — H5316 Psychophysical visual disturbances: Secondary | ICD-10-CM

## 2017-02-10 DIAGNOSIS — D508 Other iron deficiency anemias: Secondary | ICD-10-CM | POA: Diagnosis not present

## 2017-02-10 LAB — CBC WITH DIFFERENTIAL/PLATELET
Basophils Absolute: 0 cells/uL (ref 0–200)
Basophils Relative: 0 %
Eosinophils Absolute: 46 cells/uL (ref 15–500)
Eosinophils Relative: 1 %
HCT: 40 % (ref 35.0–45.0)
Hemoglobin: 13.2 g/dL (ref 11.7–15.5)
Lymphocytes Relative: 21 %
Lymphs Abs: 966 cells/uL (ref 850–3900)
MCH: 28.7 pg (ref 27.0–33.0)
MCHC: 33 g/dL (ref 32.0–36.0)
MCV: 87 fL (ref 80.0–100.0)
MPV: 8.8 fL (ref 7.5–12.5)
Monocytes Absolute: 322 cells/uL (ref 200–950)
Monocytes Relative: 7 %
Neutro Abs: 3266 cells/uL (ref 1500–7800)
Neutrophils Relative %: 71 %
Platelets: 151 10*3/uL (ref 140–400)
RBC: 4.6 MIL/uL (ref 3.80–5.10)
RDW: 13.3 % (ref 11.0–15.0)
WBC: 4.6 10*3/uL (ref 3.8–10.8)

## 2017-02-10 NOTE — Progress Notes (Signed)
Location:  [facility] clinic Provider:  Mariam Helbert L. Renato Gails, D.O., C.M.D.  Code Status: DNR Goals of Care:  Advanced Directives 02/10/2017  Does Patient Have a Medical Advance Directive? Yes  Type of Advance Directive Living will  Does patient want to make changes to medical advance directive? -  Copy of Healthcare Power of Attorney in Chart? -   Chief Complaint  Patient presents with  . Medical Management of Chronic Issues    follow-up    HPI: Patient is a 83 y.o. female seen today for medical management of chronic diseases.  She is becoming my patient after many years of following with Dr. Chilton Si.     Maureen Ralphs:  Roses and wreaths are seen due to her vision.  It's a brain condition she reports.  Her vision has gotten much worse.  She saw her face in the mirror with the wreath around it.  Saw the globe, then a splash of royal blue.  Last night the roses reappeared and the blue came.  Comes in spurts.  HTN:  bp great today here.  Tends to be ok in the am.  High in the evening when she checks it at night. Yesterday it was 129/52 at 2pm. Around there 3 days in a row.  In the afternoon, it might be 170/67.  She's going to quit taking it so much.  She takes losartan 100mg , toprol 100mg , 8mg  cardura, and indapamide 1.25mg .  She takes it all at once in the am.    GERD:  Controlled now.   Taking protonix daily before breakfast.  Hypothyroid:  TSH has run high.  She says she was taking the protonix with the thyroid.  She has stopped that now.  Started to take the synthroid at first when she goes to the restroom in the morning.    Neuropathy:  Uses cane to ambulate between this and her knees.  Can take longer strides with a walker so uses that at home and does all of her exercises.  Has a different walker for outside and walks in the driveway.  Gets help with housecleaning.  Cooks a little bit.  Cannot see well in the kitchen and back gets tired.  They go out most of the time.  She takes a 1/2  nerve pill in the afternoon and at bedtime for anxiety.  It started when she had esophagitis.    Hgb has been low.  Last was 9.7.  Is taking iron.  Normally hgb runs 05/26/11.  Stopped eating red meat after gallbladder disease.  Might eat 1/2 filet once a week or split a lean hamburger.   Eats healthy meals well balanced.    Hyperlipidemia:  Off cholesterol meds due to pain in muscles around knees. Dr. Cleophas Dunker felt that it was a side effect of the statin (lipitor).  Had hypokalemia last year.  Takes potassium at times though it was now normal.  Takes with applesauce.  Right hand and arm go numb due to her cervical spine arthritis.  Pts aspercreme and lidocaine on the right hand.  Has a gabapentin compound for both feet.    Past Medical History:  Diagnosis Date  . Allergic rhinitis due to pollen   . Anxiety state, unspecified   . Calculus of kidney   . Cataract   . Diaphragmatic hernia without mention of obstruction or gangrene   . Diffuse cystic mastopathy   . Dizziness and giddiness   . GERD (gastroesophageal reflux disease)   .  Hiatal hernia   . Insomnia, unspecified   . Insomnia, unspecified   . Lumbago   . Macular degeneration (senile) of retina, unspecified   . Open wound of toe(s), without mention of complication   . Osteoarthrosis, unspecified whether generalized or localized, unspecified site   . Other and unspecified hyperlipidemia   . Personal history of fall   . Sciatica   . Sebaceous cyst   . Senile osteoporosis   . Unspecified constipation   . Unspecified essential hypertension   . Unspecified hereditary and idiopathic peripheral neuropathy   . Unspecified hypothyroidism   . Unspecified tinnitus     Past Surgical History:  Procedure Laterality Date  . CATARACT EXTRACTION, BILATERAL    . CHOLECYSTECTOMY  07/25/2007  . TONSILLECTOMY AND ADENOIDECTOMY Bilateral 1943    Allergies  Allergen Reactions  . Ambien [Zolpidem Tartrate]     hallucination  .  Bactrim [Sulfamethoxazole-Trimethoprim]     Unknown reaction   . Morphine And Related Nausea And Vomiting  . Zithromax [Azithromycin]     Not effective      Allergies as of 02/10/2017      Reactions   Ambien [zolpidem Tartrate]    hallucination   Bactrim [sulfamethoxazole-trimethoprim]    Unknown reaction    Morphine And Related Nausea And Vomiting   Zithromax [azithromycin]    Not effective        Medication List       Accurate as of 02/10/17 11:43 AM. Always use your most recent med list.          acetaminophen 500 MG tablet Commonly known as:  TYLENOL Take 500 mg by mouth every 4 (four) hours as needed.   ALPRAZolam 1 MG tablet Commonly known as:  XANAX TAKE 1 TABLET BY MOUTH UP TO 3 TIMES A DAY AS NEEDED FOR ANXIETY AND REST   AVASTIN 100 MG/4ML Soln Generic drug:  Bevacizumab Inject monthly in left  eye every 6 weeks.   CENTRUM SILVER tablet Take 1 tablet by mouth daily.   CLARITIN 10 MG tablet Generic drug:  loratadine Take 10 mg by mouth daily.   doxazosin 8 MG tablet Commonly known as:  CARDURA One daily to control BP   indapamide 1.25 MG tablet Commonly known as:  LOZOL One daily to help control BP   ketorolac 0.5 % ophthalmic solution Commonly known as:  ACULAR Place 1 drop into both eyes daily as needed (for eye pain).   levothyroxine 100 MCG tablet Commonly known as:  SYNTHROID, LEVOTHROID One daily for thyroid supplement   losartan 100 MG tablet Commonly known as:  COZAAR One daily to control BP   LUCENTIS 0.3 MG/0.05ML Soln Generic drug:  Ranibizumab Inject into left eye every 5 weeks.   metoprolol succinate 100 MG 24 hr tablet Commonly known as:  TOPROL-XL TAKE 1 TABLET BY MOUTH EVERY DAY WITH A MEAL TO CONTROL BLOOD PRESSURE   pantoprazole 40 MG tablet Commonly known as:  PROTONIX TAKE ONE TABLET BY MOUTH IN THE MORNING 30 MINUTES BEFORE BREAKFAST FOR STOMACH   polyethylene glycol packet Commonly known as:  MIRALAX /  GLYCOLAX Take 17 g by mouth daily.   sertraline 50 MG tablet Commonly known as:  ZOLOFT TAKE 1 TABLET (50 MG TOTAL) BY MOUTH DAILY. TO CALM NERVES   SYSTANE BALANCE 0.6 % Soln Generic drug:  Propylene Glycol Place 1 drop into both eyes 2 (two) times daily.   temazepam 30 MG capsule Commonly known as:  RESTORIL Take  1 capsule (30 mg total) by mouth at bedtime as needed. for sleep   VITAMIN B-12 PO Take 1 tablet by mouth daily. Vitamin B-12       Review of Systems:  Review of Systems  Constitutional: Positive for weight loss. Negative for chills, fever and malaise/fatigue.       Down 4 lbs  HENT: Positive for hearing loss. Negative for congestion.   Eyes: Positive for blurred vision.       See hpi  Respiratory: Negative for cough and shortness of breath.   Cardiovascular: Negative for chest pain, palpitations and leg swelling.  Gastrointestinal: Negative for abdominal pain, blood in stool, constipation, diarrhea, heartburn, melena, nausea and vomiting.  Genitourinary: Negative for dysuria.  Musculoskeletal: Positive for neck pain. Negative for falls and myalgias.  Skin: Negative for itching and rash.  Neurological: Positive for tingling and sensory change. Negative for dizziness, loss of consciousness and weakness.  Endo/Heme/Allergies: Does not bruise/bleed easily.  Psychiatric/Behavioral: Negative for depression and memory loss. The patient is nervous/anxious.     Health Maintenance  Topic Date Due  . TETANUS/TDAP  12/08/2015  . INFLUENZA VACCINE  03/15/2017  . DEXA SCAN  Completed  . PNA vac Low Risk Adult  Completed    Physical Exam: Vitals:   02/10/17 1130  BP: 138/70  Pulse: 60  Temp: 97.7 F (36.5 C)  TempSrc: Oral  SpO2: 94%  Weight: 125 lb (56.7 kg)   Body mass index is 22.14 kg/m. Physical Exam  Constitutional: She is oriented to person, place, and time. She appears well-developed and well-nourished. No distress.  Thin white female  HENT:  Head:  Normocephalic and atraumatic.  Eyes:  Glasses, low vision  Cardiovascular: Normal rate, regular rhythm, normal heart sounds and intact distal pulses.   Pulmonary/Chest: Effort normal and breath sounds normal. No respiratory distress.  Abdominal: Bowel sounds are normal.  Musculoskeletal: Normal range of motion.  Did not have cane or walker today  Neurological: She is alert and oriented to person, place, and time. A sensory deficit is present. No cranial nerve deficit.  Bilateral feet and legs and right hand  Skin: Skin is warm and dry. Capillary refill takes less than 2 seconds.  Psychiatric: She has a normal mood and affect.  anxious    Labs reviewed: Basic Metabolic Panel:  Recent Labs  16/10/96 1011  08/02/16 1759  08/16/16 1145 09/06/16 1557 10/05/16 0957  NA 137  < >  --   < > 131* 133* 132*  K 4.1  < >  --   < > 3.9 4.0 3.7  CL 94*  < >  --   < > 90* 93* 90*  CO2 33*  < >  --   < > 35* 34* 34*  GLUCOSE 88  < >  --   < > 91 118* 93  BUN 12  < >  --   < > 14 14 10   CREATININE 0.74  < >  --   < > 0.72 0.68 0.73  CALCIUM 9.5  < >  --   < > 9.7 9.4 9.2  MG  --   --  1.5*  --   --   --   --   PHOS  --   --  2.1*  --   --   --   --   TSH 3.39  --   --   --   --   --  6.18*  < > =  values in this interval not displayed. Liver Function Tests:  Recent Labs  04/11/16 1011 08/02/16 1047 08/02/16 1759  AST 17 19 21   ALT 12 13* 13*  ALKPHOS 79 72 68  BILITOT 0.4 0.6 0.6  PROT 6.1 6.1* 5.9*  ALBUMIN 3.8 3.4* 3.5   No results for input(s): LIPASE, AMYLASE in the last 8760 hours. No results for input(s): AMMONIA in the last 8760 hours. CBC:  Recent Labs  08/02/16 1016 08/03/16 0520 10/05/16 0957  WBC 4.6 2.9* 4.3  NEUTROABS 3.3  --  3,225  HGB 13.3 11.9* 12.6  HCT 37.3 35.1* 37.9  MCV 81.1 85.2 85.4  PLT 108* 113* 146   Lipid Panel:  Recent Labs  04/11/16 1037 10/05/16 0957  CHOL 193 189  HDL 49 56  LDLCALC 113 109*  TRIG 155* 118  CHOLHDL 3.9 3.4    Lab Results  Component Value Date   HGBA1C  07/23/2007    5.7 (NOTE)   The ADA recommends the following therapeutic goals for glycemic   control related to Hgb A1C measurement:   Goal of Therapy:   < 7.0% Hgb A1C   Action Suggested:  > 8.0% Hgb A1C   Ref:  Diabetes Care, 22, Suppl. 1, 1999    Assessment/Plan 1. Iron deficiency anemia secondary to inadequate dietary iron intake - cont increased dietary iron and f/u lab today - CBC with Differential/Platelet  2. Essential hypertension -bp seems to generally be well controlled, she checks it often with the home cuff and then worries about it -advised to just check in the am about an hour after pills, otherwise only if she has headache or new symptoms of concern -cont current regimen and monitor  3. Hypokalemia -low historically, recently normal, pt may be reading her printout wrong as she thinks it's still low, but also has still been taking some potassium - Basic metabolic panel  4. Hypothyroidism, adult -cont current levothyroxine and f/u lab - TSH  5. Anxiety state -continues to take temazepam at hs for sleep and xanax up to three times a day for anxiety so she can rest in the afternoon typically--reports this has helped her tremendously (noted due to being on beer's list and these two are not recommended in her age group unless safer meds ineffective)  6. Maureen Ralphsharles Bonnet syndrome -cont f/u with ophtho, had been getting avastin injections for her macular degeneration, this is a new diagnosis recently related to her macular/severe visual impairment which limits her function now  Labs/tests ordered:   Orders Placed This Encounter  Procedures  . CBC with Differential/Platelet  . Basic metabolic panel    Order Specific Question:   Has the patient fasted?    Answer:   Yes  . TSH    Next appt:  05/04/2017 med mgt   Maxime Beckner L. Pradeep Beaubrun, D.O. Geriatrics [facility] [address] [id number] Cell Phone (Mon-Fri 8am-5pm):  [phone] On Call:  [phone] & follow prompts after 5pm & weekends Office Phone:  [phone] Office Fax:  [phone]

## 2017-02-10 NOTE — Patient Instructions (Signed)
Iron-Rich Diet Iron is a mineral that helps your body to produce hemoglobin. Hemoglobin is a protein in your red blood cells that carries oxygen to your body's tissues. Eating too little iron may cause you to feel weak and tired, and it can increase your risk for infection. Eating enough iron is necessary for your body's metabolism, muscle function, and nervous system. Iron is naturally found in many foods. It can also be added to foods or fortified in foods. There are two types of dietary iron:  Heme iron. Heme iron is absorbed by the body more easily than nonheme iron. Heme iron is found in meat, poultry, and fish.  Nonheme iron. Nonheme iron is found in dietary supplements, iron-fortified grains, beans, and vegetables.  You may need to follow an iron-rich diet if:  You have been diagnosed with iron deficiency or iron-deficiency anemia.  You have a condition that prevents you from absorbing dietary iron, such as: ? Infection in your intestines. ? Celiac disease. This involves long-lasting (chronic) inflammation of your intestines.  You do not eat enough iron.  You eat a diet that is high in foods that impair iron absorption.  You have lost a lot of blood.  You have heavy bleeding during your menstrual cycle.  You are pregnant.  What is my plan? Your health care provider may help you to determine how much iron you need per day based on your condition. Generally, when a person consumes sufficient amounts of iron in the diet, the following iron needs are met:  Men. ? 14-18 years old: 11 mg per day. ? 19-50 years old: 8 mg per day.  Women. ? 14-18 years old: 15 mg per day. ? 19-50 years old: 18 mg per day. ? Over 50 years old: 8 mg per day. ? Pregnant women: 27 mg per day. ? Breastfeeding women: 9 mg per day.  What do I need to know about an iron-rich diet?  Eat fresh fruits and vegetables that are high in vitamin C along with foods that are high in iron. This will help  increase the amount of iron that your body absorbs from food, especially with foods containing nonheme iron. Foods that are high in vitamin C include oranges, peppers, tomatoes, and mango.  Take iron supplements only as directed by your health care provider. Overdose of iron can be life-threatening. If you were prescribed iron supplements, take them with orange juice or a vitamin C supplement.  Cook foods in pots and pans that are made from iron.  Eat nonheme iron-containing foods alongside foods that are high in heme iron. This helps to improve your iron absorption.  Certain foods and drinks contain compounds that impair iron absorption. Avoid eating these foods in the same meal as iron-rich foods or with iron supplements. These include: ? Coffee, black tea, and red wine. ? Milk, dairy products, and foods that are high in calcium. ? Beans, soybeans, and peas. ? Whole grains.  When eating foods that contain both nonheme iron and compounds that impair iron absorption, follow these tips to absorb iron better. ? Soak beans overnight before cooking. ? Soak whole grains overnight and drain them before using. ? Ferment flours before baking, such as using yeast in bread dough. What foods can I eat? Grains Iron-fortified breakfast cereal. Iron-fortified whole-wheat bread. Enriched rice. Sprouted grains. Vegetables Spinach. Potatoes with skin. Green peas. Broccoli. Red and green bell peppers. Fermented vegetables. Fruits Prunes. Raisins. Oranges. Strawberries. Mango. Grapefruit. Meats and Other Protein Sources   Beef liver. Oysters. Beef. Shrimp. [place]. Chicken. [place]. Sardines. Chickpeas. Nuts. Tofu. Beverages Tomato juice. Fresh orange juice. Prune juice. Hibiscus tea. Fortified instant breakfast shakes. Condiments Tahini. Fermented soy sauce. Sweets and Desserts Black-strap molasses. Other Wheat germ. The items listed above may not be a complete list of recommended foods or beverages.  Contact your dietitian for more options. What foods are not recommended? Grains Whole grains. Bran cereal. Bran flour. Oats. Vegetables Artichokes. Brussels sprouts. Kale. Fruits Blueberries. Raspberries. Strawberries. Figs. Meats and Other Protein Sources Soybeans. Products made from soy protein. Dairy Milk. Cream. Cheese. Yogurt. Cottage cheese. Beverages Coffee. Black tea. Red wine. Sweets and Desserts Cocoa. Chocolate. Ice cream. Other Basil. Oregano. Parsley. The items listed above may not be a complete list of foods and beverages to avoid. Contact your dietitian for more information. This information is not intended to replace advice given to you by your health care provider. Make sure you discuss any questions you have with your health care provider. Document Released: 03/15/2005 Document Revised: 02/19/2016 Document Reviewed: 02/26/2014 Elsevier Interactive Patient Education  [employer].

## 2017-02-11 LAB — BASIC METABOLIC PANEL
BUN: 13 mg/dL (ref 7–25)
CO2: 32 mmol/L — ABNORMAL HIGH (ref 20–31)
Calcium: 9.3 mg/dL (ref 8.6–10.4)
Chloride: 88 mmol/L — ABNORMAL LOW (ref 98–110)
Creat: 0.76 mg/dL (ref 0.60–0.88)
Glucose, Bld: 79 mg/dL (ref 65–99)
Potassium: 4.3 mmol/L (ref 3.5–5.3)
Sodium: 131 mmol/L — ABNORMAL LOW (ref 135–146)

## 2017-02-11 LAB — TSH: TSH: 3.59 mIU/L

## 2017-02-13 ENCOUNTER — Encounter: Payer: Self-pay | Admitting: *Deleted

## 2017-02-14 ENCOUNTER — Other Ambulatory Visit: Payer: Self-pay | Admitting: *Deleted

## 2017-02-14 ENCOUNTER — Other Ambulatory Visit: Payer: Self-pay | Admitting: Internal Medicine

## 2017-02-14 ENCOUNTER — Other Ambulatory Visit: Payer: Self-pay | Admitting: Nurse Practitioner

## 2017-02-14 MED ORDER — POTASSIUM CHLORIDE ER 10 MEQ PO TBCR: 10.0000 meq | EXTENDED_RELEASE_TABLET | Freq: Every day | ORAL | 1 refills | Status: DC

## 2017-02-28 DIAGNOSIS — H353124 Nonexudative age-related macular degeneration, left eye, advanced atrophic with subfoveal involvement: Secondary | ICD-10-CM | POA: Diagnosis not present

## 2017-02-28 DIAGNOSIS — H353212 Exudative age-related macular degeneration, right eye, with inactive choroidal neovascularization: Secondary | ICD-10-CM | POA: Diagnosis not present

## 2017-02-28 DIAGNOSIS — H353222 Exudative age-related macular degeneration, left eye, with inactive choroidal neovascularization: Secondary | ICD-10-CM | POA: Diagnosis not present

## 2017-02-28 DIAGNOSIS — H353113 Nonexudative age-related macular degeneration, right eye, advanced atrophic without subfoveal involvement: Secondary | ICD-10-CM | POA: Diagnosis not present

## 2017-04-03 ENCOUNTER — Other Ambulatory Visit: Payer: Self-pay | Admitting: Internal Medicine

## 2017-04-08 ENCOUNTER — Other Ambulatory Visit: Payer: Self-pay | Admitting: Internal Medicine

## 2017-04-08 DIAGNOSIS — I10 Essential (primary) hypertension: Secondary | ICD-10-CM

## 2017-04-23 ENCOUNTER — Other Ambulatory Visit: Payer: Self-pay | Admitting: Internal Medicine

## 2017-05-04 ENCOUNTER — Ambulatory Visit (INDEPENDENT_AMBULATORY_CARE_PROVIDER_SITE_OTHER): Payer: Medicare Other | Admitting: Internal Medicine

## 2017-05-04 ENCOUNTER — Encounter: Payer: Self-pay | Admitting: Internal Medicine

## 2017-05-04 VITALS — BP 138/70 | HR 62 | Temp 97.7°F | Wt 126.0 lb

## 2017-05-04 DIAGNOSIS — G609 Hereditary and idiopathic neuropathy, unspecified: Secondary | ICD-10-CM

## 2017-05-04 DIAGNOSIS — K219 Gastro-esophageal reflux disease without esophagitis: Secondary | ICD-10-CM | POA: Diagnosis not present

## 2017-05-04 DIAGNOSIS — Z23 Encounter for immunization: Secondary | ICD-10-CM | POA: Diagnosis not present

## 2017-05-04 DIAGNOSIS — I10 Essential (primary) hypertension: Secondary | ICD-10-CM

## 2017-05-04 DIAGNOSIS — E871 Hypo-osmolality and hyponatremia: Secondary | ICD-10-CM

## 2017-05-04 DIAGNOSIS — E039 Hypothyroidism, unspecified: Secondary | ICD-10-CM

## 2017-05-04 DIAGNOSIS — G4700 Insomnia, unspecified: Secondary | ICD-10-CM | POA: Diagnosis not present

## 2017-05-04 DIAGNOSIS — E785 Hyperlipidemia, unspecified: Secondary | ICD-10-CM | POA: Diagnosis not present

## 2017-05-04 NOTE — Progress Notes (Signed)
Location:  [facility] clinic Provider:  Joanathan Affeldt L. Renato Gails, D.O., C.M.D.  Code Status: DNR Goals of Care:  Advanced Directives 02/10/2017  Does Patient Have a Medical Advance Directive? Yes  Type of Advance Directive Living will  Does patient want to make changes to medical advance directive? -  Copy of Healthcare Power of Attorney in Chart? -   Chief Complaint  Patient presents with  . Medical Management of Chronic Issues    follow-up    HPI: Patient is a 83 y.o. female seen today for medical management of chronic diseases.    HTN:  BP at goal with current regimen.  No lightheadedness or dizzy.  Says her bp is still up at times.  Takes all meds and has breakfast.  Comes back home and checks with her meter and it's usually in the 130s/60s.  If she waits to take the doxazosin in the afternoon, it will be up at 149 or 150.  Has had high BP since age 18 yo.  Losartan is newer and toprol dose is higher.    She thought she might have had a mini stroke when she jumbled up her words.  Dr. Chilton Si thought she just had a memory lapse.  She said this recurred the other night when they were out to dinner. She just could not think of words, but speech itself was normal  She says she had an episode 20 years ago where she was at a hotel and couldn't see herself in the mirror and her doctor then told her it was a mini stroke.    GERD:  On protonix.  Doing great.    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 Now right is crusted around the macula so the shots are not going to help the wet macular now. Had not had any charles-bonnet for 2 months, but had it the past three days--saw new patterns this time.  Saw flowers on a page when reading.    Hypothyroidism:  Taking as prescribed first thin in the morning before her yogurt Lab Results  Component Value Date   TSH 3.59 02/10/2017   Hyperlipidemia:  LDL is ok at 109.  No known CAD.  Hyponatremia:  Chronic.  Na in lower 130s.  Back and walking still  not as good.  Uses her cane.  Does her exercises.  They get out to [place] each am.  She says they're trying to stay home another year.  There may be plans to eventually move to [place].    Bowels ok with miralax.  Gets nervous gut in the afternoon and xanax helps it.  Also takes it with her sleeping pill at night.  Has slight essential tremor.    Requests flu shot.    Past Medical History:  Diagnosis Date  . Allergic rhinitis due to pollen   . Anxiety state, unspecified   . Calculus of kidney   . Cataract   . Diaphragmatic hernia without mention of obstruction or gangrene   . Diffuse cystic mastopathy   . Dizziness and giddiness   . GERD (gastroesophageal reflux disease)   . Hiatal hernia   . Insomnia, unspecified   . Insomnia, unspecified   . Lumbago   . Macular degeneration (senile) of retina, unspecified   . Open wound of toe(s), without mention of complication   . Osteoarthrosis, unspecified whether generalized or localized, unspecified site   . Other and unspecified hyperlipidemia   . Personal history of fall   . Sciatica   .  Sebaceous cyst   . Senile osteoporosis   . Unspecified constipation   . Unspecified essential hypertension   . Unspecified hereditary and idiopathic peripheral neuropathy   . Unspecified hypothyroidism   . Unspecified tinnitus     Past Surgical History:  Procedure Laterality Date  . CATARACT EXTRACTION, BILATERAL    . CHOLECYSTECTOMY  07/25/2007  . TONSILLECTOMY AND ADENOIDECTOMY Bilateral 1943    Allergies  Allergen Reactions  . Ambien [Zolpidem Tartrate]     hallucination  . Bactrim [Sulfamethoxazole-Trimethoprim]     Unknown reaction   . Morphine And Related Nausea And Vomiting  . Zithromax [Azithromycin]     Not effective      Outpatient Encounter Prescriptions as of 05/04/2017  Medication Sig  . acetaminophen (TYLENOL) 500 MG tablet Take 500 mg by mouth every 4 (four) hours as needed.  . ALPRAZolam (XANAX) 1 MG tablet  TAKE 1 TABLET BY MOUTH UP TO 3 TIMES DAILY AS NEEDED FOR ANXIETY & REST  . Bevacizumab (AVASTIN) 100 MG/4ML SOLN Inject monthly in left  eye every 6 weeks.  . Cyanocobalamin (VITAMIN B-12 PO) Take 1 tablet by mouth daily. Vitamin B-12  . doxazosin (CARDURA) 8 MG tablet One daily to control BP  . indapamide (LOZOL) 1.25 MG tablet One daily to help control BP  . ketorolac (ACULAR) 0.5 % ophthalmic solution Place 1 drop into both eyes daily as needed (for eye pain).  Marland Kitchen levothyroxine (SYNTHROID, LEVOTHROID) 100 MCG tablet Take 1 tablet (100 mcg total) by mouth daily before breakfast.  . loratadine (CLARITIN) 10 MG tablet Take 10 mg by mouth daily.   Marland Kitchen losartan (COZAAR) 100 MG tablet One daily to control BP  . metoprolol succinate (TOPROL-XL) 100 MG 24 hr tablet TAKE 1 TABLET BY MOUTH EVERY DAY WITH A MEAL TO CONTROL BLOOD PRESSURE  . Multiple Vitamins-Minerals (CENTRUM SILVER) tablet Take 1 tablet by mouth daily.  . pantoprazole (PROTONIX) 40 MG tablet TAKE ONE TABLET BY MOUTH IN THE MORNING 30 MINUTES BEFORE BREAKFAST FOR STOMACH  . polyethylene glycol (MIRALAX / GLYCOLAX) packet Take 17 g by mouth daily.   . potassium chloride (KLOR-CON 10) 10 MEQ tablet Take 1 tablet (10 mEq total) by mouth daily.  Marland Kitchen Propylene Glycol (SYSTANE BALANCE) 0.6 % SOLN Place 1 drop into both eyes 2 (two) times daily.  . Ranibizumab (LUCENTIS) 0.3 MG/0.05ML SOLN Inject into left eye every 5 weeks.  . sertraline (ZOLOFT) 50 MG tablet TAKE 1 TABLET BY MOUTH EVERY DAY TO CALM NERVES  . temazepam (RESTORIL) 30 MG capsule Take 1 capsule (30 mg total) by mouth at bedtime as needed. for sleep   No facility-administered encounter medications on file as of 05/04/2017.     Review of Systems:  Review of Systems  Constitutional: Negative for chills, fever and malaise/fatigue.  HENT: Negative for congestion and hearing loss.   Eyes: Positive for blurred vision. Negative for redness.       Macular degeneration, glasses, visions  related  Respiratory: Negative for shortness of breath.   Cardiovascular: Negative for chest pain, palpitations and leg swelling.  Gastrointestinal: Negative for abdominal pain, blood in stool, constipation and melena.  Genitourinary: Negative for dysuria.  Musculoskeletal: Negative for falls and myalgias.  Skin: Negative for itching and rash.  Neurological: Positive for tingling and sensory change. Negative for dizziness, loss of consciousness and weakness.  Psychiatric/Behavioral: Negative for depression and memory loss. The patient is nervous/anxious and has insomnia.     Health Maintenance  Topic Date  Due  . TETANUS/TDAP  12/08/2015  . INFLUENZA VACCINE  03/15/2017  . DEXA SCAN  Completed  . PNA vac Low Risk Adult  Completed    Physical Exam: Vitals:   05/04/17 1510  BP: 138/70  Pulse: 62  Temp: 97.7 F (36.5 C)  TempSrc: Oral  SpO2: 94%  Weight: 126 lb (57.2 kg)   Body mass index is 22.32 kg/m. Physical Exam  Constitutional: She is oriented to person, place, and time. She appears well-developed and well-nourished.  HENT:  Head: Normocephalic and atraumatic.  Cardiovascular: Normal rate, regular rhythm, normal heart sounds and intact distal pulses.   Pulmonary/Chest: Effort normal and breath sounds normal. No respiratory distress.  Abdominal: Bowel sounds are normal.  Musculoskeletal: Normal range of motion.  Neurological: She is alert and oriented to person, place, and time.  Skin: Skin is warm and dry. Capillary refill takes less than 2 seconds.  Psychiatric: She has a normal mood and affect.    Labs reviewed: Basic Metabolic Panel:  Recent Labs  44/01/02 1759  09/06/16 1557 10/05/16 0957 02/10/17 1219  NA  --   < > 133* 132* 131*  K  --   < > 4.0 3.7 4.3  CL  --   < > 93* 90* 88*  CO2  --   < > 34* 34* 32*  GLUCOSE  --   < > 118* 93 79  BUN  --   < > CREATININE  --   < > 0.68 0.73 0.76  CALCIUM  --   < > 9.4 9.2 9.3  MG 1.5*  --   --   --    --   PHOS 2.1*  --   --   --   --   TSH  --   --   --  6.18* 3.59  < > = values in this interval not displayed. Liver Function Tests:  Recent Labs  08/02/16 1047 08/02/16 1759  AST 19 21  ALT 13* 13*  ALKPHOS 72 68  BILITOT 0.6 0.6  PROT 6.1* 5.9*  ALBUMIN 3.4* 3.5   No results for input(s): LIPASE, AMYLASE in the last 8760 hours. No results for input(s): AMMONIA in the last 8760 hours. CBC:  Recent Labs  08/02/16 1016 08/03/16 0520 10/05/16 0957 02/10/17 1219  WBC 4.6 2.9* 4.3 4.6  NEUTROABS 3.3  --  3,225 3,266  HGB 13.3 11.9* 12.6 13.2  HCT 37.3 35.1* 37.9 40.0  MCV 81.1 85.2 85.4 87.0  PLT 108* 113* 146 151   Lipid Panel:  Recent Labs  10/05/16 0957  CHOL 189  HDL 56  LDLCALC 109*  TRIG 118  CHOLHDL 3.4   Lab Results  Component Value Date   HGBA1C  07/23/2007    5.7 (NOTE)   The ADA recommends the following therapeutic goals for glycemic   control related to Hgb A1C measurement:   Goal of Therapy:   < 7.0% Hgb A1C   Action Suggested:  > 8.0% Hgb A1C   Ref:  Diabetes Care, 22, Suppl. 1, 1999    Assessment/Plan 1. Essential hypertension -bp at goal with losartan, toprol xl, doxazosin - CBC with Differential/Platelet; Future - COMPLETE METABOLIC PANEL WITH GFR; Future  2. Gastroesophageal reflux disease, esophagitis presence not specified -controlled with protonix  3. Hypothyroidism, adult - cont levothyroxine and monitor - TSH; Future  4. Hereditary and idiopathic peripheral neuropathy -stable, involves hands and feet  5. Hyperlipidemia, unspecified hyperlipidemia type - not  on medication and levels have been satisfactory - Lipid panel; Future  6. Insomnia, unspecified type -is used to taking her temazepam after many years, as well as her xanax for anxiety  7. Hyponatremia -Na stable in lower 130s  8. Need for influenza vaccination -flu shot given  Labs/tests ordered:   Orders Placed This Encounter  Procedures  . CBC with  Differential/Platelet    Standing Status:   Future    Standing Expiration Date:   01/01/2018  . COMPLETE METABOLIC PANEL WITH GFR    Standing Status:   Future    Standing Expiration Date:   01/01/2018  . Lipid panel    Standing Status:   Future    Standing Expiration Date:   01/01/2018  . TSH    Standing Status:   Future    Standing Expiration Date:   01/01/2018   Next appt:  10/10/2017 for AWV, see me in Jan with labs before   Mike Berntsen L. Lacrecia Delval, D.O. Geriatrics [facility] [address], [place] [address] Cell Phone (Mon-Fri 8am-5pm):  [phone] On Call:  [phone] & follow prompts after 5pm & weekends Office Phone:  [phone] Office Fax:  [phone]

## 2017-05-04 NOTE — Addendum Note (Signed)
Addended by: Sueanne Margarita on: 05/04/2017 04:00 PM   Modules accepted: Orders

## 2017-05-22 ENCOUNTER — Other Ambulatory Visit: Payer: Self-pay | Admitting: Internal Medicine

## 2017-05-22 DIAGNOSIS — G4700 Insomnia, unspecified: Secondary | ICD-10-CM

## 2017-05-22 NOTE — Telephone Encounter (Signed)
Ok to fill ? Last refilled 11/09/2016

## 2017-05-22 NOTE — Telephone Encounter (Signed)
Ok to fill? Last refill 04/03/2017 #90

## 2017-05-22 NOTE — Telephone Encounter (Signed)
Yes.  She's quite anxious.  I don't think we'll get her off this for sleep.

## 2017-05-22 NOTE — Telephone Encounter (Signed)
rx called into pharmacy

## 2017-05-23 NOTE — Telephone Encounter (Signed)
rx called into pharmacy

## 2017-05-30 DIAGNOSIS — H353222 Exudative age-related macular degeneration, left eye, with inactive choroidal neovascularization: Secondary | ICD-10-CM | POA: Diagnosis not present

## 2017-05-30 DIAGNOSIS — H353212 Exudative age-related macular degeneration, right eye, with inactive choroidal neovascularization: Secondary | ICD-10-CM | POA: Diagnosis not present

## 2017-05-30 DIAGNOSIS — H353124 Nonexudative age-related macular degeneration, left eye, advanced atrophic with subfoveal involvement: Secondary | ICD-10-CM | POA: Diagnosis not present

## 2017-05-30 DIAGNOSIS — H353113 Nonexudative age-related macular degeneration, right eye, advanced atrophic without subfoveal involvement: Secondary | ICD-10-CM | POA: Diagnosis not present

## 2017-06-15 ENCOUNTER — Encounter (HOSPITAL_COMMUNITY): Payer: Self-pay

## 2017-06-15 ENCOUNTER — Emergency Department (HOSPITAL_COMMUNITY): Payer: Medicare Other

## 2017-06-15 ENCOUNTER — Emergency Department (HOSPITAL_COMMUNITY)
Admission: EM | Admit: 2017-06-15 | Discharge: 2017-06-15 | Disposition: A | Discharge status: Home / Self Care | Payer: Medicare Other | Attending: Emergency Medicine | Admitting: Emergency Medicine

## 2017-06-15 DIAGNOSIS — S01441A Puncture wound with foreign body of right cheek and temporomandibular area, initial encounter: Secondary | ICD-10-CM | POA: Insufficient documentation

## 2017-06-15 DIAGNOSIS — Z23 Encounter for immunization: Secondary | ICD-10-CM | POA: Insufficient documentation

## 2017-06-15 DIAGNOSIS — W010XXA Fall on same level from slipping, tripping and stumbling without subsequent striking against object, initial encounter: Secondary | ICD-10-CM | POA: Diagnosis not present

## 2017-06-15 DIAGNOSIS — S01111A Laceration without foreign body of right eyelid and periocular area, initial encounter: Secondary | ICD-10-CM | POA: Insufficient documentation

## 2017-06-15 DIAGNOSIS — S0993XA Unspecified injury of face, initial encounter: Secondary | ICD-10-CM | POA: Diagnosis not present

## 2017-06-15 DIAGNOSIS — Y999 Unspecified external cause status: Secondary | ICD-10-CM | POA: Insufficient documentation

## 2017-06-15 DIAGNOSIS — S0083XA Contusion of other part of head, initial encounter: Secondary | ICD-10-CM

## 2017-06-15 DIAGNOSIS — Y9301 Activity, walking, marching and hiking: Secondary | ICD-10-CM | POA: Diagnosis not present

## 2017-06-15 DIAGNOSIS — S0990XA Unspecified injury of head, initial encounter: Secondary | ICD-10-CM | POA: Diagnosis not present

## 2017-06-15 DIAGNOSIS — S0182XA Laceration with foreign body of other part of head, initial encounter: Secondary | ICD-10-CM | POA: Diagnosis not present

## 2017-06-15 DIAGNOSIS — W19XXXA Unspecified fall, initial encounter: Secondary | ICD-10-CM

## 2017-06-15 DIAGNOSIS — S01411A Laceration without foreign body of right cheek and temporomandibular area, initial encounter: Secondary | ICD-10-CM | POA: Diagnosis not present

## 2017-06-15 DIAGNOSIS — Y929 Unspecified place or not applicable: Secondary | ICD-10-CM | POA: Insufficient documentation

## 2017-06-15 DIAGNOSIS — S0085XA Superficial foreign body of other part of head, initial encounter: Secondary | ICD-10-CM | POA: Diagnosis not present

## 2017-06-15 DIAGNOSIS — S098XXA Other specified injuries of head, initial encounter: Secondary | ICD-10-CM | POA: Diagnosis not present

## 2017-06-15 DIAGNOSIS — S0101XA Laceration without foreign body of scalp, initial encounter: Secondary | ICD-10-CM

## 2017-06-15 MED ORDER — TETANUS-DIPHTH-ACELL PERTUSSIS 5-2.5-18.5 LF-MCG/0.5 IM SUSP
0.5000 mL | Freq: Once | INTRAMUSCULAR | Status: AC
  Administered 2017-06-15: 0.5 mL via INTRAMUSCULAR
  Filled 2017-06-15: qty 0.5

## 2017-06-15 MED ORDER — LIDOCAINE-EPINEPHRINE (PF) 2 %-1:200000 IJ SOLN
10.0000 mL | Freq: Once | INTRAMUSCULAR | Status: AC
  Administered 2017-06-15: 10 mL
  Filled 2017-06-15: qty 20

## 2017-06-15 NOTE — ED Notes (Signed)
Pt stable, ambulatory, states understanding of discharge instructions 

## 2017-06-15 NOTE — ED Triage Notes (Signed)
[place] [facility] [place].  Pt hit right side of face and her glasses frame has in a way fish-hooked through her cheek.

## 2017-06-15 NOTE — ED Provider Notes (Signed)
.Foreign Body Removal Date/Time: 06/15/2017 11:37 PM Performed by: Melene PlanFLOYD, Obrien Huskins Authorized by: Melene PlanFLOYD, Kamy Poinsett  Consent: Verbal consent obtained. Risks and benefits: risks, benefits and alternatives were discussed Consent given by: patient Patient understanding: patient states understanding of the procedure being performed Patient identity confirmed: verbally with patient Time out: Immediately prior to procedure a "time out" was called to verify the correct patient, procedure, equipment, support staff and site/side marked as required. Body area: skin General location: head/neck Location details: face Anesthesia: local infiltration  Anesthesia: Local Anesthetic: lidocaine 2% with epinephrine Anesthetic total: 5 mL  Sedation: Patient sedated: no Patient restrained: no Patient cooperative: yes Complexity: simple 1 objects recovered. Objects recovered: glasses frame Post-procedure assessment: foreign body removed Patient tolerance: Patient tolerated the procedure well with no immediate complications .Marland Kitchen.Laceration Repair Date/Time: 06/15/2017 11:37 PM Performed by: Adela LankFLOYD, Halina Asano Authorized by: Melene PlanFLOYD, Netanel Yannuzzi   Consent:    Consent obtained:  Verbal   Consent given by:  Patient   Risks discussed:  Infection, pain, poor cosmetic result and poor wound healing   Alternatives discussed:  No treatment and delayed treatment Anesthesia (see MAR for exact dosages):    Anesthesia method:  Local infiltration   Local anesthetic:  Lidocaine 2% WITH epi Laceration details:    Location:  Face   Face location:  R cheek   Length (cm):  0.5 Repair type:    Repair type:  Simple Pre-procedure details:    Preparation:  Patient was prepped and draped in usual sterile fashion Exploration:    Hemostasis achieved with:  Direct pressure   Wound exploration: wound explored through full range of motion   Treatment:    Wound cleansed with: Chlorhexidine.   Irrigation solution:  Sterile saline   Irrigation volume:   20   Irrigation method:  Pressure wash   Visualized foreign bodies/material removed: yes   Skin repair:    Repair method:  Sutures   Suture size:  5-0   Suture material:  Fast-absorbing gut   Suture technique:  Simple interrupted   Number of sutures:  1 Approximation:    Approximation:  Close Post-procedure details:    Dressing:  Open (no dressing) .Marland Kitchen.Laceration Repair Date/Time: 06/15/2017 11:38 PM Performed by: Adela LankFLOYD, Shantika Bermea Authorized by: Melene PlanFLOYD, Aaden Buckman   Consent:    Consent obtained:  Verbal   Consent given by:  Patient   Risks discussed:  Infection, pain, poor cosmetic result and poor wound healing   Alternatives discussed:  No treatment, delayed treatment and observation Anesthesia (see MAR for exact dosages):    Anesthesia method:  Local infiltration   Local anesthetic:  Lidocaine 2% WITH epi Laceration details:    Location:  Face   Face location:  R eyebrow   Length (cm):  2.7 Repair type:    Repair type:  Simple Exploration:    Contaminated: no   Treatment:    Wound cleansed with: Chlorhexidine.   Amount of cleaning:  Standard   Irrigation solution:  Sterile saline   Irrigation volume:  20   Irrigation method:  Pressure wash   Visualized foreign bodies/material removed: no   Skin repair:    Repair method:  Sutures   Suture size:  5-0   Suture material:  Fast-absorbing gut   Suture technique:  Simple interrupted   Number of sutures:  2 Approximation:    Approximation:  Close   Vermilion border: well-aligned   Post-procedure details:    Dressing:  Open (no dressing)   Patient tolerance of procedure:  Tolerated well, no immediate complications      Melene Plan, DO 06/15/17 2339

## 2017-06-15 NOTE — ED Provider Notes (Signed)
[facility] EMERGENCY DEPARTMENT Provider Note   CSN: [id number] Arrival date & time: 06/15/17  2011     History   Chief Complaint Chief Complaint  Patient presents with  . Fall    HPI [name] is a 83 y.o. female.  Patient with a history of GERD, Hiatal hernia, dizziness, cataract, macular degeneration, and fall [place] [employer] [place] her [place] fishhooked into her right cheek. Patient was [place] [employer] and was about to step onto the curb with the assistance of her caine, but she took a wrong step and fell. She hit her right cheek and forehead on the concrete and her glasses were imbedded into her right cheek. She and her family deny any confusion, visual changes, hearing changes following the fall. She states she has had a couple of fall in the last few months attributed to her decreasing vision secondary to macular degeneration and her neuropathy secondary to her lumbar spondylopathy.      Past Medical History:  Diagnosis Date  . Allergic rhinitis due to pollen   . Anxiety state, unspecified   . Calculus of kidney   . Cataract   . Diaphragmatic hernia without mention of obstruction or gangrene   . Diffuse cystic mastopathy   . Dizziness and giddiness   . GERD (gastroesophageal reflux disease)   . Hiatal hernia   . Insomnia, unspecified   . Insomnia, unspecified   . Lumbago   . Macular degeneration (senile) of retina, unspecified   . Open wound of toe(s), without mention of complication   . Osteoarthrosis, unspecified whether generalized or localized, unspecified site   . Other and unspecified hyperlipidemia   . Personal history of fall   . Sciatica   . Sebaceous cyst   . Senile osteoporosis   . Unspecified constipation   . Unspecified essential hypertension   . Unspecified hereditary and idiopathic peripheral neuropathy   . Unspecified hypothyroidism   . Unspecified tinnitus     Patient Active  Problem List   Diagnosis Date Noted  . 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 Iron deficiency anemia secondary to inadequate dietary iron intake 02/10/2017  . Facial asymmetry 11/09/2016  . Incontinence 09/22/2016  . Hypoxia 08/02/2016  . Hypokalemia 08/02/2016  . Lumbar spondylolysis 04/13/2016  . Bilateral knee pain 12/08/2015  . Abnormality of gait 08/11/2015  . Edema 08/11/2015  . Bunion 08/11/2015  . Quadriceps weakness 08/11/2015  . Anxiety state 03/10/2015  . Palpitations 02/18/2015  . GERD (gastroesophageal reflux disease) 11/05/2014  . Hypothyroidism, adult   . Hyperlipidemia   . Essential hypertension   . Macular degeneration (senile) of retina   . Diaphragmatic hernia   . Insomnia   . Hereditary and idiopathic peripheral neuropathy   . Low back pain     Past Surgical History:  Procedure Laterality Date  . CATARACT EXTRACTION, BILATERAL    . CHOLECYSTECTOMY  07/25/2007  . TONSILLECTOMY AND ADENOIDECTOMY Bilateral 1943    OB History    No data available       Home Medications    Prior to Admission medications   Medication Sig Start Date End Date Taking? Authorizing Provider  acetaminophen (TYLENOL) 500 MG tablet Take 500 mg by mouth every 4 (four) hours as needed.   Yes [provider]  ALPRAZolam Prudy Feeler) 1 MG tablet Take 1 tablet (1 mg total) by mouth 3 (three) times daily as needed for anxiety. 05/22/17  Yes Reed,  Tiffany L, DO  Cyanocobalamin (VITAMIN B-12 PO) Take 1 tablet by mouth daily. Vitamin B-12   Yes [provider]  doxazosin (CARDURA) 8 MG tablet One daily to control BP 11/09/16  Yes Kimber Relic, MD  indapamide (LOZOL) 1.25 MG tablet One daily to help control BP 11/18/16  Yes Kimber Relic, MD  levothyroxine (SYNTHROID, LEVOTHROID) 100 MCG tablet Take 1 tablet (100 mcg total) by mouth daily before breakfast. 02/14/17  Yes Reed, Tiffany L, DO  loratadine (CLARITIN) 10 MG tablet Take 10 mg by mouth daily.    Yes [provider]  losartan (COZAAR) 100 MG tablet One daily to control BP 11/09/16  Yes Kimber Relic, MD  metoprolol succinate (TOPROL-XL) 100 MG 24 hr tablet TAKE 1 TABLET BY MOUTH EVERY DAY WITH A MEAL TO CONTROL BLOOD PRESSURE 04/10/17  Yes Reed, Tiffany L, DO  Multiple Vitamins-Minerals (CENTRUM SILVER) tablet Take 1 tablet by mouth daily.   Yes [provider]  pantoprazole (PROTONIX) 40 MG tablet TAKE ONE TABLET BY MOUTH IN THE MORNING 30 MINUTES BEFORE BREAKFAST FOR STOMACH 09/12/16  Yes Kimber Relic, MD  polyethylene glycol [facility] / GLYCOLAX) packet Take 17 g by mouth daily.    Yes [provider]  potassium chloride (KLOR-CON 10) 10 MEQ tablet Take 1 tablet (10 mEq total) by mouth daily. 02/14/17  Yes Reed, Tiffany L, DO  Propylene Glycol (SYSTANE BALANCE) 0.6 % SOLN Place 1 drop into both eyes 2 (two) times daily.   Yes [provider]  sertraline (ZOLOFT) 50 MG tablet TAKE 1 TABLET BY MOUTH EVERY DAY TO CALM NERVES 04/24/17  Yes Reed, Tiffany L, DO  temazepam (RESTORIL) 30 MG capsule TAKE ONE CAPSULE BY MOUTH AT BEDTIME AS NEEDED FOR SLEEP Patient taking differently: TAKE ONE CAPSULE BY MOUTH AT BEDTIME 05/23/17  Yes Reed, Tiffany L, DO    Family History Family History  Problem Relation Age of Onset  . Hypertension Brother   . Fibromyalgia Daughter   . Hypertension Son   . Hypertension Brother   . Hyperlipidemia Brother   . Breast cancer Neg Hx     Social History Social History  Substance Use Topics  . Smoking status: Never Smoker  . Smokeless tobacco: Never Used  . Alcohol use No     Allergies   Ambien [zolpidem tartrate]; Bactrim [sulfamethoxazole-trimethoprim]; Morphine and related; and Zithromax [azithromycin]   Review of Systems Review of Systems  Constitutional: Negative for chills and fever.  HENT: Positive for facial swelling. Negative for hearing loss.   Eyes: Negative for discharge and visual disturbance.  Respiratory: Negative  for shortness of breath and wheezing.   Cardiovascular: Negative for chest pain and leg swelling.  Gastrointestinal: Negative for abdominal distention and abdominal pain.  Neurological: Negative for dizziness and light-headedness.     Physical Exam Updated Vital Signs BP (!) 187/82 (BP Location: Right Arm)   Pulse 66   Temp 98.3 F (36.8 C) (Oral)   Resp 17   Ht 5\' 3"  (1.6 m)   Wt 57.2 kg (126 lb)   SpO2 99%   BMI 22.32 kg/m   Physical Exam  Constitutional: She is oriented to person, place, and time. She appears well-developed and well-nourished. No distress.  HENT:  Contusion of right lateral brow Contusion of right cheek with eyeglasses embedded  Eyes: EOM are normal. Right eye exhibits no discharge. Left eye exhibits no discharge.  Cardiovascular: Normal rate, regular rhythm and normal heart sounds.  Pulmonary/Chest: Effort normal and breath sounds normal. No respiratory distress. She has no wheezes.  Abdominal: Soft. Bowel sounds are normal. She exhibits no distension. There is no tenderness.  Musculoskeletal: She exhibits no edema or deformity.  Neurological: She is alert and oriented to person, place, and time.  Skin: Skin is warm and dry.  Psychiatric: She has a normal mood and affect.     ED Treatments / Results  Labs (all labs ordered are listed, but only abnormal results are displayed) Labs Reviewed - No data to display  EKG  EKG Interpretation None       Radiology Ct Head Wo Contrast  Result Date: 06/15/2017 CLINICAL DATA:  Fall with facial trauma EXAM: CT HEAD WITHOUT CONTRAST CT MAXILLOFACIAL WITHOUT CONTRAST TECHNIQUE: Multidetector CT imaging of the head and maxillofacial structures were performed using the standard protocol without intravenous contrast. Multiplanar CT image reconstructions of the maxillofacial structures were also generated. COMPARISON:  None. FINDINGS: CT HEAD FINDINGS Brain: No mass lesion, intraparenchymal hemorrhage or  extra-axial collection. No evidence of acute cortical infarct. There is periventricular hypoattenuation compatible with chronic microvascular disease. Vascular: No hyperdense vessel or unexpected calcification. Skull:  No fracture. CT MAXILLOFACIAL FINDINGS Osseous: --Complex facial fracture types: No LeFort, zygomaticomaxillary complex or nasoorbitoethmoidal fracture. --Simple fracture types: None. --Mandible, hard palate and teeth: No acute abnormality. Orbits: The globes appear intact. Normal appearance of the intra- and extraconal fat. Symmetric extraocular muscles. Sinuses: No fluid levels or advanced mucosal thickening. Soft tissues: There is a metallic eye glasses segment piercing the right infraorbital subcutaneous tissues. The foreign body does not approach any vascular structure or important facial tissue. IMPRESSION: 1. Findings of chronic microvascular ischemia without acute intracranial abnormality. 2. Metallic segment of eye glasses frame piercing the right infraorbital facial soft tissue. Foreign body passes only through subcutaneous fat without injury to any major vessel or important facial structure. 3. No facial fracture or skull fracture. Electronically Signed   By: Deatra RobinsonKevin  Herman M.D.   On: 06/15/2017 22:52   Ct Maxillofacial Wo Contrast  Result Date: 06/15/2017 CLINICAL DATA:  Fall with facial trauma EXAM: CT HEAD WITHOUT CONTRAST CT MAXILLOFACIAL WITHOUT CONTRAST TECHNIQUE: Multidetector CT imaging of the head and maxillofacial structures were performed using the standard protocol without intravenous contrast. Multiplanar CT image reconstructions of the maxillofacial structures were also generated. COMPARISON:  None. FINDINGS: CT HEAD FINDINGS Brain: No mass lesion, intraparenchymal hemorrhage or extra-axial collection. No evidence of acute cortical infarct. There is periventricular hypoattenuation compatible with chronic microvascular disease. Vascular: No hyperdense vessel or unexpected  calcification. Skull:  No fracture. CT MAXILLOFACIAL FINDINGS Osseous: --Complex facial fracture types: No LeFort, zygomaticomaxillary complex or nasoorbitoethmoidal fracture. --Simple fracture types: None. --Mandible, hard palate and teeth: No acute abnormality. Orbits: The globes appear intact. Normal appearance of the intra- and extraconal fat. Symmetric extraocular muscles. Sinuses: No fluid levels or advanced mucosal thickening. Soft tissues: There is a metallic eye glasses segment piercing the right infraorbital subcutaneous tissues. The foreign body does not approach any vascular structure or important facial tissue. IMPRESSION: 1. Findings of chronic microvascular ischemia without acute intracranial abnormality. 2. Metallic segment of eye glasses frame piercing the right infraorbital facial soft tissue. Foreign body passes only through subcutaneous fat without injury to any major vessel or important facial structure. 3. No facial fracture or skull fracture. Electronically Signed   By: Deatra RobinsonKevin  Herman M.D.   On: 06/15/2017 22:52    Procedures Procedures (including critical care time)  Medications Ordered in ED  Medications  Tdap (BOOSTRIX) injection 0.5 mL (0.5 mLs Intramuscular Given 06/15/17 2134)  lidocaine-EPINEPHrine (XYLOCAINE W/EPI) 2 %-1:200000 (PF) injection 10 mL (10 mLs Other Given by Other 06/15/17 2134)     Initial Impression / Assessment and Plan / ED Course  I have reviewed the triage vital signs and the nursing notes.  Pertinent labs & imaging results that were available during my care of the patient were reviewed by me and considered in my medical decision making (see chart for details).    Patient presenting following fall where she hit her head and embedded her glasses into her right cheek. Patient is without neurologic deficits. - CT Head and Face: No acute intracranial abnormality; No facial or skull fracture; Metallic segment of eye glasses frame piercing the right  infraorbital facial soft tissue. Foreign body passes only through subcutaneous fat without injury to any major vessel or important facial structure.   Glasses were removed and wounds were sutured - Patient was discharged with wound care instructions and instruction to follow up with her PCP  Final Clinical Impressions(s) / ED Diagnoses   Final diagnoses:  Fall, initial encounter  Contusion of face, initial encounter  Laceration of scalp, initial encounter    New Prescriptions New Prescriptions   No medications on file     Beola Cord, MD 06/15/17 2333    Beola Cord, MD 06/15/17 2334    Melene Plan, DO 06/15/17 2336

## 2017-06-15 NOTE — Discharge Instructions (Signed)
Thank you for allowing us to care for you  Your imaging showed no signs of bleeding in your brain or fracture of your skull.  You wounds were cleaned and sutured - Care for your wounds and sutures as instructed.  Follow up with your primary care provider.   Contact a medical professional if you experience severe symptoms.

## 2017-06-15 NOTE — ED Notes (Signed)
Patient transported to CT 

## 2017-06-16 ENCOUNTER — Ambulatory Visit: Payer: Medicare Other | Admitting: Nurse Practitioner

## 2017-06-20 ENCOUNTER — Encounter: Payer: Self-pay | Admitting: Nurse Practitioner

## 2017-06-20 ENCOUNTER — Ambulatory Visit (INDEPENDENT_AMBULATORY_CARE_PROVIDER_SITE_OTHER): Payer: Medicare Other | Admitting: Nurse Practitioner

## 2017-06-20 VITALS — BP 124/76 | HR 84 | Temp 98.0°F | Resp 17 | Ht 63.0 in | Wt 125.6 lb

## 2017-06-20 DIAGNOSIS — M4306 Spondylolysis, lumbar region: Secondary | ICD-10-CM | POA: Diagnosis not present

## 2017-06-20 DIAGNOSIS — K219 Gastro-esophageal reflux disease without esophagitis: Secondary | ICD-10-CM

## 2017-06-20 DIAGNOSIS — M810 Age-related osteoporosis without current pathological fracture: Secondary | ICD-10-CM

## 2017-06-20 DIAGNOSIS — G609 Hereditary and idiopathic neuropathy, unspecified: Secondary | ICD-10-CM

## 2017-06-20 DIAGNOSIS — Z111 Encounter for screening for respiratory tuberculosis: Secondary | ICD-10-CM

## 2017-06-20 DIAGNOSIS — I10 Essential (primary) hypertension: Secondary | ICD-10-CM | POA: Diagnosis not present

## 2017-06-20 DIAGNOSIS — F411 Generalized anxiety disorder: Secondary | ICD-10-CM | POA: Diagnosis not present

## 2017-06-20 DIAGNOSIS — E785 Hyperlipidemia, unspecified: Secondary | ICD-10-CM

## 2017-06-20 DIAGNOSIS — H3530 Unspecified macular degeneration: Secondary | ICD-10-CM

## 2017-06-20 DIAGNOSIS — E039 Hypothyroidism, unspecified: Secondary | ICD-10-CM

## 2017-06-20 NOTE — Patient Instructions (Signed)
Recommended to take caltrate with D 600/400 twice a day due to low bone mass/osteoporis

## 2017-06-20 NOTE — Progress Notes (Signed)
Careteam: Patient Care Team: Kermit Balo, DO as PCP - General (Geriatric Medicine) Luciana Axe Alford Highland, MD as Consulting Physician (Ophthalmology) Donzetta Starch, MD as Consulting Physician (Dermatology) Hart Carwin, MD (Inactive) as Consulting Physician (Gastroenterology)  Advanced Directive information Does Patient Have a Medical Advance Directive?: Yes, Type of Advance Directive: Living will, Does patient want to make changes to medical advance directive?: No - Patient declined  Allergies  Allergen Reactions  . Ambien [Zolpidem Tartrate]     hallucination  . Bactrim [Sulfamethoxazole-Trimethoprim]     Unknown reaction   . Morphine And Related Nausea And Vomiting  . Zithromax [Azithromycin]     Not effective      Chief Complaint  Patient presents with  . Follow-up    Pt is being seen for a ED follow up. Pt was seen at [facility] on 06/15/17 due ot a fall; pt has 2 stitches in right eyebrow and 2 stitches on right cheek bone.   . Forms    Pt needs admission paperwork from [facility] completed.     HPI: Patient is a 83 y.o. female seen in the office today for form completion and follow up on ED visit. Pt went to ED due to [place] [employer]. Has macular degeneration and could not see step. Per ED notes: She was wearing glasses and they got impaled into her cheek.  The lens popped out and so she does not think there was lens involvement.  No small broken pieces of metal as far as they know.  Exam in ED with frame of glasses impaled into the right cheek.  Small amount of metallic foreign body palpated into the soft tissues.  2.7 cm laceration to the right brow.  Foreign body was removed and sutured at bedside.  CT scans are negative.  Discharge home with follow up today.  Pain free. No increase in brusing, no changes in vision, no further falls.  visually impaired due to macular degeneration. On left eye she can only see peripherally. Right eye does not have deficit.  Can  read larger print.    Pt and husband plan to move to [facility]. They have assisted living there if needed. Very excited about move. She states they offer a lot of assistance that they can benefit from.  Last year when they were in the hospital their son put both their names on the list for an apartment, there was a year wait at the time and now their names have come up. They had 24 hours to decide if they wanted to apartment. It is a 2 bedroom on the first floor which is what they wanted.   GERD- has HH, under control with protonix. Also have a nervous stomach.   Constipation- uses miralax which controls this.   Neuropathy- uses asper cream and support hose which helps. Saw podiatrist uses a compound cream with lidocaine that she uses in the morning which helps her walking, toes do not feel numb.   Review of Systems:  Review of Systems  Constitutional: Negative for chills, fever and malaise/fatigue.  HENT: Negative for congestion and hearing loss.   Eyes: Positive for blurred vision. Negative for redness.       Macular degeneration, glasses, visions related  Respiratory: Negative for shortness of breath.   Cardiovascular: Negative for chest pain, palpitations and leg swelling.  Gastrointestinal: Negative for abdominal pain, blood in stool, constipation and melena.  Genitourinary: Negative for dysuria.  Musculoskeletal:  Negative for falls and myalgias.  Skin: Negative for itching and rash.  Neurological: Positive for tingling and sensory change. Negative for dizziness, loss of consciousness and weakness.  Psychiatric/Behavioral: Negative for depression and memory loss. The patient is nervous/anxious and has insomnia.     Past Medical History:  Diagnosis Date  . Allergic rhinitis due to pollen   . Anxiety state, unspecified   . Calculus of kidney   . Cataract   . Diaphragmatic hernia without mention of obstruction or gangrene   . Diffuse cystic mastopathy   .  Dizziness and giddiness   . GERD (gastroesophageal reflux disease)   . Hiatal hernia   . Insomnia, unspecified   . Insomnia, unspecified   . Lumbago   . Macular degeneration (senile) of retina, unspecified   . Open wound of toe(s), without mention of complication   . Osteoarthrosis, unspecified whether generalized or localized, unspecified site   . Other and unspecified hyperlipidemia   . Personal history of fall   . Sciatica   . Sebaceous cyst   . Senile osteoporosis   . Unspecified constipation   . Unspecified essential hypertension   . Unspecified hereditary and idiopathic peripheral neuropathy   . Unspecified hypothyroidism   . Unspecified tinnitus    Past Surgical History:  Procedure Laterality Date  . CATARACT EXTRACTION, BILATERAL    . CHOLECYSTECTOMY  07/25/2007  . TONSILLECTOMY AND ADENOIDECTOMY Bilateral 1943   Social History:   reports that  has never smoked. she has never used smokeless tobacco. She reports that she does not drink alcohol or use drugs.  Family History  Problem Relation Age of Onset  . Hypertension Brother   . Fibromyalgia Daughter   . Hypertension Son   . Hypertension Brother   . Hyperlipidemia Brother   . Breast cancer Neg Hx     Medications:   Medication List        Accurate as of 06/20/17 10:47 AM. Always use your most recent med list.          acetaminophen 500 MG tablet Commonly known as:  TYLENOL   ALPRAZolam 1 MG tablet Commonly known as:  XANAX Take 1 tablet (1 mg total) by mouth 3 (three) times daily as needed for anxiety.   CENTRUM SILVER tablet   CLARITIN 10 MG tablet Generic drug:  loratadine   doxazosin 8 MG tablet Commonly known as:  CARDURA One daily to control BP   indapamide 1.25 MG tablet Commonly known as:  LOZOL One daily to help control BP   levothyroxine 100 MCG tablet Commonly known as:  SYNTHROID, LEVOTHROID Take 1 tablet (100 mcg total) by mouth daily before breakfast.   losartan 100 MG  tablet Commonly known as:  COZAAR One daily to control BP   metoprolol succinate 100 MG 24 hr tablet Commonly known as:  TOPROL-XL TAKE 1 TABLET BY MOUTH EVERY DAY WITH A MEAL TO CONTROL BLOOD PRESSURE   pantoprazole 40 MG tablet Commonly known as:  PROTONIX TAKE ONE TABLET BY MOUTH IN THE MORNING 30 MINUTES BEFORE BREAKFAST FOR STOMACH   polyethylene glycol packet Commonly known as:  MIRALAX / GLYCOLAX   potassium chloride 10 MEQ tablet Commonly known as:  KLOR-CON 10 Take 1 tablet (10 mEq total) by mouth daily.   sertraline 50 MG tablet Commonly known as:  ZOLOFT TAKE 1 TABLET BY MOUTH EVERY DAY TO CALM NERVES   SYSTANE BALANCE 0.6 % Soln Generic drug:  Propylene Glycol   temazepam 30 MG  capsule Commonly known as:  RESTORIL TAKE ONE CAPSULE BY MOUTH AT BEDTIME AS NEEDED FOR SLEEP   VITAMIN B-12 PO        Physical Exam:  Vitals:   06/20/17 0958  BP: 124/76  Pulse: 84  Resp: 17  Temp: 98 F (36.7 C)  TempSrc: Oral  SpO2: 96%  Weight: 125 lb 9.6 oz (57 kg)  Height: 5\' 3"  (1.6 m)   Body mass index is 22.25 kg/m.  Physical Exam  Constitutional: She is oriented to person, place, and time. She appears well-developed and well-nourished.  HENT:  Head: Normocephalic and atraumatic.  Cardiovascular: Normal rate, regular rhythm and normal heart sounds.  Pulmonary/Chest: Effort normal and breath sounds normal. No respiratory distress.  Abdominal: Soft. Bowel sounds are normal.  Musculoskeletal: Normal range of motion.  Neurological: She is alert and oriented to person, place, and time.  Skin: Skin is warm and dry.  ecchymosis to right eye with sutures noted to right upper orbit and right cheek   Psychiatric: She has a normal mood and affect.    Labs reviewed: Basic Metabolic Panel: Recent Labs    08/02/16 1759  09/06/16 1557 10/05/16 0957 02/10/17 1219  NA  --    < > 133* 132* 131*  K  --    < > 4.0 3.7 4.3  CL  --    < > 93* 90* 88*  CO2  --    < >  34* 34* 32*  GLUCOSE  --    < > 118* 93 79  BUN  --    < > 14 10 13   CREATININE  --    < > 0.68 0.73 0.76  CALCIUM  --    < > 9.4 9.2 9.3  MG 1.5*  --   --   --   --   PHOS 2.1*  --   --   --   --   TSH  --   --   --  6.18* 3.59   < > = values in this interval not displayed.   Liver Function Tests: Recent Labs    08/02/16 1047 08/02/16 1759  AST 19 21  ALT 13* 13*  ALKPHOS 72 68  BILITOT 0.6 0.6  PROT 6.1* 5.9*  ALBUMIN 3.4* 3.5   No results for input(s): LIPASE, AMYLASE in the last 8760 hours. No results for input(s): AMMONIA in the last 8760 hours. CBC: Recent Labs    08/02/16 1016 08/03/16 0520 10/05/16 0957 02/10/17 1219  WBC 4.6 2.9* 4.3 4.6  NEUTROABS 3.3  --  3,225 3,266  HGB 13.3 11.9* 12.6 13.2  HCT 37.3 35.1* 37.9 40.0  MCV 81.1 85.2 85.4 87.0  PLT 108* 113* 146 151   Lipid Panel: Recent Labs    10/05/16 0957  CHOL 189  HDL 56  LDLCALC 109*  TRIG 118  CHOLHDL 3.4   TSH: Recent Labs    10/05/16 0957 02/10/17 1219  TSH 6.18* 3.59   A1C: Lab Results  Component Value Date   HGBA1C  07/23/2007    5.7 (NOTE)   The ADA recommends the following therapeutic goals for glycemic   control related to Hgb A1C measurement:   Goal of Therapy:   < 7.0% Hgb A1C   Action Suggested:  > 8.0% Hgb A1C   Ref:  Diabetes Care, 22, Suppl. 1, 1999     Assessment/Plan 1. Osteoporosis, unspecified osteoporosis type, unspecified pathological fracture presence -previously on Actonel and Fosamax for about  20 years has been off medication for ~4 years.  Recommended to take caltrate with D 600/400 twice a day.  - DG Bone Density; Future  2. Essential hypertension -controlled on current regimen  3. Gastroesophageal reflux disease, esophagitis presence not specified Controlled on protonix  4. Hypothyroidism, adult TSH stable in June, conts on levothyroxine 100 mcg  5. Lumbar spondylolysis - stable, occasionally feels weak. Uses tylenol once daily if needed  6.  Anxiety state Stable on zoloft.   7. Macular degeneration (senile) of retina Following with ophthalmology, contributing to recent fall.   8. Hyperlipidemia, unspecified hyperlipidemia type Not currently on medication due to myalgias assisted with statin.   9. Hereditary and idiopathic peripheral neuropathy Controlled with creams.   Next appt: as scheduled.  Janene HarveyJessica K. Biagio BorgEubanks, AGNP  [facility] [phone](Monday-Friday 8 am - 5 pm) [phone] (after hours)

## 2017-06-22 ENCOUNTER — Ambulatory Visit (INDEPENDENT_AMBULATORY_CARE_PROVIDER_SITE_OTHER): Payer: Medicare Other | Admitting: Nurse Practitioner

## 2017-06-22 VITALS — BP 124/70 | HR 60 | Temp 98.0°F

## 2017-06-22 DIAGNOSIS — Z111 Encounter for screening for respiratory tuberculosis: Secondary | ICD-10-CM | POA: Diagnosis not present

## 2017-06-22 LAB — TB SKIN TEST
INDURATION: 0 mm
TB SKIN TEST: NEGATIVE

## 2017-06-22 NOTE — Progress Notes (Signed)
Patient ID: [name], female   DOB: 1930-08-19, 83 y.o.   MRN: [id number] PPD Reading Note PPD read and results entered in EpicCare. Result: 0 mm induration. Interpretation: NEGATIVE If test not read within 48-72 hours of initial placement, patient advised to repeat in other arm 1-3 weeks after this test. Allergic reaction: no

## 2017-07-04 ENCOUNTER — Telehealth: Payer: Self-pay

## 2017-07-04 NOTE — Telephone Encounter (Signed)
I spoke with patient and she stated that she would come by the office to sign medical release forms so that her and her husbands records can be sent to [facility].

## 2017-07-04 NOTE — Telephone Encounter (Signed)
Patient called stating that her assisted living facility never received her medical records.   I left a message asking patient to call the office to discuss her concerns and she if she completed a medical release form.

## 2017-07-10 ENCOUNTER — Other Ambulatory Visit: Payer: Self-pay | Admitting: Internal Medicine

## 2017-07-10 NOTE — Telephone Encounter (Signed)
rx called into pharmacy

## 2017-07-11 DIAGNOSIS — H1851 Endothelial corneal dystrophy: Secondary | ICD-10-CM | POA: Diagnosis not present

## 2017-07-11 DIAGNOSIS — H353223 Exudative age-related macular degeneration, left eye, with inactive scar: Secondary | ICD-10-CM | POA: Diagnosis not present

## 2017-07-11 DIAGNOSIS — Z961 Presence of intraocular lens: Secondary | ICD-10-CM | POA: Diagnosis not present

## 2017-07-11 DIAGNOSIS — H353113 Nonexudative age-related macular degeneration, right eye, advanced atrophic without subfoveal involvement: Secondary | ICD-10-CM | POA: Diagnosis not present

## 2017-07-11 DIAGNOSIS — H524 Presbyopia: Secondary | ICD-10-CM | POA: Diagnosis not present

## 2017-07-11 DIAGNOSIS — H52223 Regular astigmatism, bilateral: Secondary | ICD-10-CM | POA: Diagnosis not present

## 2017-07-20 DIAGNOSIS — H353222 Exudative age-related macular degeneration, left eye, with inactive choroidal neovascularization: Secondary | ICD-10-CM | POA: Diagnosis not present

## 2017-07-20 DIAGNOSIS — H353124 Nonexudative age-related macular degeneration, left eye, advanced atrophic with subfoveal involvement: Secondary | ICD-10-CM | POA: Diagnosis not present

## 2017-07-20 DIAGNOSIS — H353113 Nonexudative age-related macular degeneration, right eye, advanced atrophic without subfoveal involvement: Secondary | ICD-10-CM | POA: Diagnosis not present

## 2017-07-20 DIAGNOSIS — H353212 Exudative age-related macular degeneration, right eye, with inactive choroidal neovascularization: Secondary | ICD-10-CM | POA: Diagnosis not present

## 2017-07-21 ENCOUNTER — Other Ambulatory Visit: Payer: Self-pay | Admitting: Internal Medicine

## 2017-07-27 ENCOUNTER — Other Ambulatory Visit: Payer: Self-pay | Admitting: Internal Medicine

## 2017-08-20 ENCOUNTER — Other Ambulatory Visit: Payer: Self-pay | Admitting: Internal Medicine

## 2017-08-21 ENCOUNTER — Other Ambulatory Visit: Payer: Self-pay | Admitting: *Deleted

## 2017-08-21 DIAGNOSIS — G4700 Insomnia, unspecified: Secondary | ICD-10-CM

## 2017-08-21 MED ORDER — LEVOTHYROXINE SODIUM 100 MCG PO TABS: 100.0000 ug | ORAL_TABLET | Freq: Every day | ORAL | 1 refills | Status: DC

## 2017-08-21 MED ORDER — TEMAZEPAM 30 MG PO CAPS: 30.0000 mg | ORAL_CAPSULE | Freq: Every evening | ORAL | 0 refills | Status: DC | PRN

## 2017-08-21 NOTE — Telephone Encounter (Signed)
[facility] 

## 2017-08-21 NOTE — Telephone Encounter (Signed)
A refill request was received for alprazolam 1 mg tablets. Rx was called in to pharmacy after verifying last fill date, provider, and quantity on [place]MP [place] database.  

## 2017-08-29 ENCOUNTER — Other Ambulatory Visit: Payer: Medicare Other

## 2017-08-29 DIAGNOSIS — E039 Hypothyroidism, unspecified: Secondary | ICD-10-CM

## 2017-08-29 DIAGNOSIS — E785 Hyperlipidemia, unspecified: Secondary | ICD-10-CM

## 2017-08-29 DIAGNOSIS — I10 Essential (primary) hypertension: Secondary | ICD-10-CM

## 2017-08-29 LAB — CBC WITH DIFFERENTIAL/PLATELET
Basophils Absolute: 12 cells/uL (ref 0–200)
Basophils Relative: 0.3 %
Eosinophils Absolute: 31 cells/uL (ref 15–500)
Eosinophils Relative: 0.8 %
HCT: 39.5 % (ref 35.0–45.0)
Hemoglobin: 12.9 g/dL (ref 11.7–15.5)
Lymphs Abs: 850 cells/uL (ref 850–3900)
MCH: 28 pg (ref 27.0–33.0)
MCHC: 32.7 g/dL (ref 32.0–36.0)
MCV: 85.7 fL (ref 80.0–100.0)
MPV: 9.5 fL (ref 7.5–12.5)
Monocytes Relative: 8.2 %
Neutro Abs: 2687 cells/uL (ref 1500–7800)
Neutrophils Relative %: 68.9 %
Platelets: 150 10*3/uL (ref 140–400)
RBC: 4.61 10*6/uL (ref 3.80–5.10)
RDW: 12.8 % (ref 11.0–15.0)
Total Lymphocyte: 21.8 %
WBC mixed population: 320 cells/uL (ref 200–950)
WBC: 3.9 10*3/uL (ref 3.8–10.8)

## 2017-08-29 LAB — COMPLETE METABOLIC PANEL WITH GFR
AG Ratio: 1.7 (calc) (ref 1.0–2.5)
ALT: 9 U/L (ref 6–29)
AST: 14 U/L (ref 10–35)
Albumin: 4 g/dL (ref 3.6–5.1)
Alkaline phosphatase (APISO): 64 U/L (ref 33–130)
BUN: 16 mg/dL (ref 7–25)
CO2: 37 mmol/L — ABNORMAL HIGH (ref 20–32)
Calcium: 9.8 mg/dL (ref 8.6–10.4)
Chloride: 92 mmol/L — ABNORMAL LOW (ref 98–110)
Creat: 0.84 mg/dL (ref 0.60–0.88)
GFR, Est African American: 74 mL/min/{1.73_m2} (ref >=60)
GFR, Est Non African American: 64 mL/min/{1.73_m2} (ref >=60)
Globulin: 2.3 g/dL (calc) (ref 1.9–3.7)
Glucose, Bld: 100 mg/dL — ABNORMAL HIGH (ref 65–99)
Potassium: 3.6 mmol/L (ref 3.5–5.3)
Sodium: 136 mmol/L (ref 135–146)
Total Bilirubin: 0.6 mg/dL (ref 0.2–1.2)
Total Protein: 6.3 g/dL (ref 6.1–8.1)

## 2017-08-29 LAB — LIPID PANEL
Cholesterol: 198 mg/dL (ref <200)
HDL: 61 mg/dL (ref >50)
LDL Cholesterol (Calc): 115 mg/dL (calc) — ABNORMAL HIGH
Non-HDL Cholesterol (Calc): 137 mg/dL (calc) — ABNORMAL HIGH (ref <130)
Total CHOL/HDL Ratio: 3.2 (calc) (ref <5.0)
Triglycerides: 113 mg/dL (ref <150)

## 2017-08-29 LAB — TSH: TSH: 5.63 mIU/L — ABNORMAL HIGH (ref 0.40–4.50)

## 2017-08-31 ENCOUNTER — Ambulatory Visit (INDEPENDENT_AMBULATORY_CARE_PROVIDER_SITE_OTHER): Payer: Medicare Other | Admitting: Internal Medicine

## 2017-08-31 ENCOUNTER — Encounter: Payer: Self-pay | Admitting: Internal Medicine

## 2017-08-31 VITALS — BP 148/70 | HR 60 | Temp 98.0°F | Wt 123.0 lb

## 2017-08-31 DIAGNOSIS — I10 Essential (primary) hypertension: Secondary | ICD-10-CM | POA: Diagnosis not present

## 2017-08-31 DIAGNOSIS — H3530 Unspecified macular degeneration: Secondary | ICD-10-CM | POA: Diagnosis not present

## 2017-08-31 DIAGNOSIS — E785 Hyperlipidemia, unspecified: Secondary | ICD-10-CM | POA: Diagnosis not present

## 2017-08-31 DIAGNOSIS — G4700 Insomnia, unspecified: Secondary | ICD-10-CM | POA: Diagnosis not present

## 2017-08-31 DIAGNOSIS — F411 Generalized anxiety disorder: Secondary | ICD-10-CM | POA: Diagnosis not present

## 2017-08-31 DIAGNOSIS — E039 Hypothyroidism, unspecified: Secondary | ICD-10-CM | POA: Diagnosis not present

## 2017-08-31 DIAGNOSIS — G609 Hereditary and idiopathic neuropathy, unspecified: Secondary | ICD-10-CM | POA: Diagnosis not present

## 2017-08-31 MED ORDER — PANTOPRAZOLE SODIUM 40 MG PO TBEC: 40.0000 mg | DELAYED_RELEASE_TABLET | Freq: Every day | ORAL | 3 refills | Status: DC

## 2017-08-31 NOTE — Progress Notes (Signed)
Location:  [facility] clinic Provider:  Zorina Mallin L. Renato Gails, D.O., C.M.D.  Code Status: DNR Goals of Care:  Advanced Directives 08/31/2017  Does Patient Have a Medical Advance Directive? Yes  Type of Advance Directive Living will;Out of facility DNR (pink MOST or yellow form)  Does patient want to make changes to medical advance directive? No - Patient declined  Copy of Healthcare Power of Attorney in Chart? -  Would patient like information on creating a medical advance directive? -  Pre-existing out of facility DNR order (yellow form or pink MOST form) Yellow form placed in chart (order not valid for inpatient use)    Chief Complaint  Patient presents with  . Medical Management of Chronic Issues    follow-up, discuss labs    HPI: Patient is a 84 y.o. female seen today for medical management of chronic diseases and f/u on labs.  She's surviving [place].  She's having to help Noralyn Pick to dress b/c of his rotator cuff.  He's getting therapy.    She's doing alright. Her vision is worse which is hard to deal with. Her walking is not as good as it was.  She uses a walker long distances, then her cane short distances. Her neuropathy bothers her.  She is using a compound cream with lidocaine and gabapentin on the feet and knees in the day, then aspercreme instead at bedtime.  Still doing her exercises for her feet and legs.  She gets plenty of walking b/c they're at the opposite end from the dining room.      Uses her sleeping pill and xanax to sleep and sometimes also takes a gasx or tums if her stomach bothers her and a tylenol if her back is tired and she can't hold up her shoulders more on the left.    Yesterday, she went by one of the other ladies' apts and heard help and she called for help for a lady who was on the floor and could not get up.    Cholesterol just a little elevated.  Eats a lot of eggs, but not much breakfast meat, also eats at [employer]. Can't tolerate grease.  Ice  cream does not agree with her.  Might have a little dessert here and there.     Past Medical History:  Diagnosis Date  . Allergic rhinitis due to pollen   . Anxiety state, unspecified   . Calculus of kidney   . Cataract   . Diaphragmatic hernia without mention of obstruction or gangrene   . Diffuse cystic mastopathy   . Dizziness and giddiness   . GERD (gastroesophageal reflux disease)   . Hiatal hernia   . Insomnia, unspecified   . Insomnia, unspecified   . Lumbago   . Macular degeneration (senile) of retina, unspecified   . Open wound of toe(s), without mention of complication   . Osteoarthrosis, unspecified whether generalized or localized, unspecified site   . Other and unspecified hyperlipidemia   . Personal history of fall   . Sciatica   . Sebaceous cyst   . Senile osteoporosis   . Unspecified constipation   . Unspecified essential hypertension   . Unspecified hereditary and idiopathic peripheral neuropathy   . Unspecified hypothyroidism   . Unspecified tinnitus     Past Surgical History:  Procedure Laterality Date  . CATARACT EXTRACTION, BILATERAL    . CHOLECYSTECTOMY  07/25/2007  . TONSILLECTOMY AND ADENOIDECTOMY Bilateral 1943    Allergies  Allergen Reactions  . Ambien [Zolpidem  Tartrate]     hallucination  . Bactrim [Sulfamethoxazole-Trimethoprim]     Unknown reaction   . Morphine And Related Nausea And Vomiting  . Zithromax [Azithromycin]     Not effective      Outpatient Encounter Medications as of 08/31/2017  Medication Sig  . acetaminophen (TYLENOL) 500 MG tablet Take 500 mg by mouth every 4 (four) hours as needed.  . ALPRAZolam (XANAX) 1 MG tablet TAKE 1 TABLET BY MOUTH THREE TIMES A DAY AS NEEDED FOR ANXIETY  . Cyanocobalamin (VITAMIN B-12 PO) Take 1 tablet by mouth daily. Vitamin B-12  . doxazosin (CARDURA) 8 MG tablet One daily to control BP  . indapamide (LOZOL) 1.25 MG tablet One daily to help control BP  . KLOR-CON 10 10 MEQ tablet TAKE 1  TABLET BY MOUTH EVERY DAY  . levothyroxine (SYNTHROID, LEVOTHROID) 100 MCG tablet Take 1 tablet (100 mcg total) by mouth daily before breakfast.  . loratadine (CLARITIN) 10 MG tablet Take 10 mg by mouth daily.   Marland Kitchen. losartan (COZAAR) 100 MG tablet One daily to control BP  . metoprolol succinate (TOPROL-XL) 100 MG 24 hr tablet TAKE 1 TABLET BY MOUTH EVERY DAY WITH A MEAL TO CONTROL BLOOD PRESSURE  . Multiple Vitamins-Minerals (CENTRUM SILVER) tablet Take 1 tablet by mouth daily.  . pantoprazole (PROTONIX) 40 MG tablet Take 1 tablet (40 mg total) by mouth daily.  . polyethylene glycol (MIRALAX / GLYCOLAX) packet Take 17 g by mouth daily.   Marland Kitchen. Propylene Glycol (SYSTANE BALANCE) 0.6 % SOLN Place 1 drop into both eyes 2 (two) times daily.  . sertraline (ZOLOFT) 50 MG tablet TAKE 1 TABLET BY MOUTH EVERY DAY TO CALM NERVES  . temazepam (RESTORIL) 30 MG capsule Take 1 capsule (30 mg total) by mouth at bedtime as needed. for sleep  . [DISCONTINUED] pantoprazole (PROTONIX) 40 MG tablet TAKE ONE TABLET BY MOUTH IN THE MORNING 30 MINUTES BEFORE BREAKFAST FOR STOMACH   No facility-administered encounter medications on file as of 08/31/2017.     Review of Systems:  Review of Systems  Constitutional: Negative for chills, fever and malaise/fatigue.  HENT: Positive for hearing loss. Negative for congestion.   Eyes: Positive for blurred vision.  Respiratory: Negative for cough and shortness of breath.   Cardiovascular: Negative for chest pain, palpitations and leg swelling.  Gastrointestinal: Negative for abdominal pain, blood in stool and melena.  Genitourinary: Negative for dysuria.  Musculoskeletal: Positive for back pain. Negative for falls.  Skin: Negative for itching and rash.  Neurological: Negative for dizziness, loss of consciousness and weakness.  Psychiatric/Behavioral: Negative for depression, hallucinations and memory loss. The patient is nervous/anxious.     Health Maintenance  Topic Date Due   . TETANUS/TDAP  06/16/2027  . INFLUENZA VACCINE  Completed  . DEXA SCAN  Completed  . PNA vac Low Risk Adult  Completed    Physical Exam: Vitals:   08/31/17 1428  BP: (!) 148/70  Pulse: 60  Temp: 98 F (36.7 C)  TempSrc: Oral  SpO2: 95%  Weight: 123 lb (55.8 kg)   Body mass index is 21.79 kg/m. Physical Exam  Constitutional: She is oriented to person, place, and time. She appears well-developed and well-nourished. No distress.  HENT:  Head: Normocephalic and atraumatic.  Eyes:  glasses  Cardiovascular: Normal rate, regular rhythm, normal heart sounds and intact distal pulses.  Pulmonary/Chest: Effort normal and breath sounds normal. No respiratory distress.  Abdominal: Soft. Bowel sounds are normal. She exhibits no distension. There  is no tenderness.  Musculoskeletal: Normal range of motion.  Neurological: She is alert and oriented to person, place, and time.  Skin: Skin is warm and dry. Capillary refill takes less than 2 seconds.  Psychiatric: She has a normal mood and affect.    Labs reviewed: Basic Metabolic Panel: Recent Labs    10/05/16 0957 02/10/17 1219 08/29/17 1009  NA 132* 131* 136  K 3.7 4.3 3.6  CL 90* 88* 92*  CO2 34* 32* 37*  GLUCOSE 93 79 100*  BUN 10 13 16   CREATININE 0.73 0.76 0.84  CALCIUM 9.2 9.3 9.8  TSH 6.18* 3.59 5.63*   Liver Function Tests: Recent Labs    08/29/17 1009  AST 14  ALT 9  BILITOT 0.6  PROT 6.3   No results for input(s): LIPASE, AMYLASE in the last 8760 hours. No results for input(s): AMMONIA in the last 8760 hours. CBC: Recent Labs    10/05/16 0957 02/10/17 1219 08/29/17 1009  WBC 4.3 4.6 3.9  NEUTROABS 3,225 3,266 2,687  HGB 12.6 13.2 12.9  HCT 37.9 40.0 39.5  MCV 85.4 87.0 85.7  PLT 146 151 150   Lipid Panel: Recent Labs    10/05/16 0957 08/29/17 1009  CHOL 189 198  HDL 56 61  LDLCALC 109*  --   TRIG 118 113  CHOLHDL 3.4 3.2   Lab Results  Component Value Date   HGBA1C  07/23/2007     5.7 (NOTE)   The ADA recommends the following therapeutic goals for glycemic   control related to Hgb A1C measurement:   Goal of Therapy:   < 7.0% Hgb A1C   Action Suggested:  > 8.0% Hgb A1C   Ref:  Diabetes Care, 22, Suppl. 1, 1999    Assessment/Plan 1. Essential hypertension -bp satisfactorily controlled, cont same regimen - Basic metabolic panel; Future  2. Hypothyroidism, adult -cont current levothyroxine - TSH; Future  3. Hereditary and idiopathic peripheral neuropathy -encouraged use of cane for support -might benefit from gabapentin or lyrica therapy  4. Macular degeneration (senile) of retina -followed closely with ophthalmology, advance stages  5. Hyperlipidemia, unspecified hyperlipidemia type -has not tolerated statin therapy  6. Anxiety state -continues on zoloft but also on xanax and restoril from previous PCP for this which does not help her balance and fall risk  7. Insomnia, unspecified type -insists upon staying on restoril--would favor trying trazodone or melatonin  Labs/tests ordered:   Orders Placed This Encounter  Procedures  . TSH    Standing Status:   Future    Standing Expiration Date:   05/01/2018  . Basic metabolic panel    Standing Status:   Future    Standing Expiration Date:   05/01/2018    Order Specific Question:   Has the patient fasted?    Answer:   Yes    Next appt:  10/10/2017   Kennon Encinas L. Nereyda Bowler, D.O. Geriatrics [facility] [address] [place] [address] Cell Phone (Mon-Fri 8am-5pm):  [phone] On Call:  [phone] & follow prompts after 5pm & weekends Office Phone:  [phone] Office Fax:  [phone]

## 2017-09-05 ENCOUNTER — Encounter: Payer: Self-pay | Admitting: Nurse Practitioner

## 2017-09-05 ENCOUNTER — Ambulatory Visit (INDEPENDENT_AMBULATORY_CARE_PROVIDER_SITE_OTHER): Payer: Medicare Other | Admitting: Nurse Practitioner

## 2017-09-05 VITALS — BP 112/72 | HR 76 | Temp 98.4°F | Ht 63.0 in | Wt 122.6 lb

## 2017-09-05 DIAGNOSIS — I10 Essential (primary) hypertension: Secondary | ICD-10-CM | POA: Diagnosis not present

## 2017-09-05 DIAGNOSIS — K5900 Constipation, unspecified: Secondary | ICD-10-CM

## 2017-09-05 MED ORDER — DOXAZOSIN MESYLATE 8 MG PO TABS: 4.0000 mg | ORAL_TABLET | Freq: Every day | ORAL | 4 refills | Status: DC

## 2017-09-05 NOTE — Progress Notes (Signed)
Careteam: Patient Care Team: Kermit Baloeed, Tiffany L, DO as PCP - General (Geriatric Medicine) Edmon Crapeankin, Gary A, MD as Consulting Physician (Ophthalmology) Donzetta StarchJones, Drew, MD as Consulting Physician (Dermatology) Hart CarwinBrodie, Dora M, MD (Inactive) as Consulting Physician (Gastroenterology)  Advanced Directive information    Allergies  Allergen Reactions  . Ambien [Zolpidem Tartrate]     hallucination  . Bactrim [Sulfamethoxazole-Trimethoprim]     Unknown reaction   . Morphine And Related Nausea And Vomiting  . Zithromax [Azithromycin]     Not effective      Chief Complaint  Patient presents with  . Acute Visit    Patient bieng seen due to feeling lightheaded,weak, B/P was 102/64     HPI: Patient is a 84 y.o. female seen in the office today due to feeling weak and light headed.  When she was at breakfast (11 am) she was feeling weak and her legs buckled and had to get help sitting down. She had been taking miralax for several days and had large bowel movement prior to leaving the house then had another episode at breakfast. Blood pressure was low at home.  Has felt weak before when taking all her blood pressure medication at one time in the morning.  Has not been taking her blood pressure at home but has been normal and last week blood pressure was 148/70. Has taken metoprolol succinate, losartan and doxazosin today already. Was waiting to take indapamide in the evening.  Blood pressure is variable.   Feels weak when she takes miralax every day but when she takes a half of cap it is not effective.   Feeling better now. Has been drinking and eating this afternoon which helped. No palpitations, chest pains or shortness of breath  Review of Systems:  Review of Systems  Constitutional: Negative for chills, fever and malaise/fatigue.  HENT: Negative for congestion and hearing loss.   Eyes: Positive for blurred vision. Negative for redness.       Macular degeneration, glasses, visions  related  Respiratory: Negative for shortness of breath.   Cardiovascular: Negative for chest pain, palpitations and leg swelling.  Gastrointestinal: Positive for diarrhea (2 episodes this morning). Negative for abdominal pain, blood in stool, constipation and melena.  Genitourinary: Negative for dysuria.  Musculoskeletal: Negative for falls and myalgias.  Skin: Negative for itching and rash.  Neurological: Positive for weakness (has improved). Negative for dizziness, loss of consciousness and headaches.  Psychiatric/Behavioral: Negative for depression and memory loss. The patient is nervous/anxious and has insomnia.     Past Medical History:  Diagnosis Date  . Allergic rhinitis due to pollen   . Anxiety state, unspecified   . Calculus of kidney   . Cataract   . Diaphragmatic hernia without mention of obstruction or gangrene   . Diffuse cystic mastopathy   . Dizziness and giddiness   . GERD (gastroesophageal reflux disease)   . Hiatal hernia   . Insomnia, unspecified   . Insomnia, unspecified   . Lumbago   . Macular degeneration (senile) of retina, unspecified   . Open wound of toe(s), without mention of complication   . Osteoarthrosis, unspecified whether generalized or localized, unspecified site   . Other and unspecified hyperlipidemia   . Personal history of fall   . Sciatica   . Sebaceous cyst   . Senile osteoporosis   . Unspecified constipation   . Unspecified essential hypertension   . Unspecified hereditary and idiopathic peripheral neuropathy   . Unspecified hypothyroidism   .  Unspecified tinnitus    Past Surgical History:  Procedure Laterality Date  . CATARACT EXTRACTION, BILATERAL    . CHOLECYSTECTOMY  07/25/2007  . TONSILLECTOMY AND ADENOIDECTOMY Bilateral 1943   Social History:   reports that  has never smoked. she has never used smokeless tobacco. She reports that she does not drink alcohol or use drugs.  Family History  Problem Relation Age of Onset  .  Hypertension Brother   . Fibromyalgia Daughter   . Hypertension Son   . Hypertension Brother   . Hyperlipidemia Brother   . Breast cancer Neg Hx     Medications: Patient's Medications  New Prescriptions   No medications on file  Previous Medications   ACETAMINOPHEN (TYLENOL) 500 MG TABLET    Take 500 mg by mouth every 4 (four) hours as needed.   ALPRAZOLAM (XANAX) 1 MG TABLET    TAKE 1 TABLET BY MOUTH THREE TIMES A DAY AS NEEDED FOR ANXIETY   CYANOCOBALAMIN (VITAMIN B-12 PO)    Take 1 tablet by mouth daily. Vitamin B-12   DOXAZOSIN (CARDURA) 8 MG TABLET    One daily to control BP   INDAPAMIDE (LOZOL) 1.25 MG TABLET    One daily to help control BP   KLOR-CON 10 10 MEQ TABLET    TAKE 1 TABLET BY MOUTH EVERY DAY   LEVOTHYROXINE (SYNTHROID, LEVOTHROID) 100 MCG TABLET    Take 1 tablet (100 mcg total) by mouth daily before breakfast.   LORATADINE (CLARITIN) 10 MG TABLET    Take 10 mg by mouth daily.    LOSARTAN (COZAAR) 100 MG TABLET    One daily to control BP   METOPROLOL SUCCINATE (TOPROL-XL) 100 MG 24 HR TABLET    TAKE 1 TABLET BY MOUTH EVERY DAY WITH A MEAL TO CONTROL BLOOD PRESSURE   MULTIPLE VITAMINS-MINERALS (CENTRUM SILVER) TABLET    Take 1 tablet by mouth daily.   PANTOPRAZOLE (PROTONIX) 40 MG TABLET    Take 1 tablet (40 mg total) by mouth daily.   POLYETHYLENE GLYCOL (MIRALAX / GLYCOLAX) PACKET    Take 17 g by mouth daily.    PROPYLENE GLYCOL (SYSTANE BALANCE) 0.6 % SOLN    Place 1 drop into both eyes 2 (two) times daily.   SERTRALINE (ZOLOFT) 50 MG TABLET    TAKE 1 TABLET BY MOUTH EVERY DAY TO CALM NERVES   TEMAZEPAM (RESTORIL) 30 MG CAPSULE    Take 1 capsule (30 mg total) by mouth at bedtime as needed. for sleep  Modified Medications   No medications on file  Discontinued Medications   No medications on file     Physical Exam:  Vitals:   09/05/17 1443  BP: 112/72  Pulse: 76  Temp: 98.4 F (36.9 C)  TempSrc: Oral  SpO2: 97%  Weight: 122 lb 9.6 oz (55.6 kg)  Height:  5\' 3"  (1.6 m)   Body mass index is 21.72 kg/m.  Physical Exam  Constitutional: She is oriented to person, place, and time. She appears well-developed and well-nourished.  HENT:  Head: Normocephalic and atraumatic.  Mouth/Throat: Oropharynx is clear and moist. No oropharyngeal exudate.  Eyes: Pupils are equal, round, and reactive to light.  Neck: Normal range of motion. Neck supple.  Cardiovascular: Normal rate, regular rhythm and normal heart sounds.  Pulmonary/Chest: Effort normal and breath sounds normal. No respiratory distress.  Abdominal: Soft. Bowel sounds are normal. She exhibits no distension. There is no tenderness.  Musculoskeletal: Normal range of motion.  Neurological: She is alert  and oriented to person, place, and time. She displays normal reflexes. No cranial nerve deficit. Coordination normal.  Skin: Skin is warm and dry. Capillary refill takes less than 2 seconds.  Psychiatric: She has a normal mood and affect.    Labs reviewed: Basic Metabolic Panel: Recent Labs    10/05/16 0957 02/10/17 1219 08/29/17 1009  NA 132* 131* 136  K 3.7 4.3 3.6  CL 90* 88* 92*  CO2 34* 32* 37*  GLUCOSE 93 79 100*  BUN 10 13 16   CREATININE 0.73 0.76 0.84  CALCIUM 9.2 9.3 9.8  TSH 6.18* 3.59 5.63*   Liver Function Tests: Recent Labs    08/29/17 1009  AST 14  ALT 9  BILITOT 0.6  PROT 6.3   No results for input(s): LIPASE, AMYLASE in the last 8760 hours. No results for input(s): AMMONIA in the last 8760 hours. CBC: Recent Labs    10/05/16 0957 02/10/17 1219 08/29/17 1009  WBC 4.3 4.6 3.9  NEUTROABS 3,225 3,266 2,687  HGB 12.6 13.2 12.9  HCT 37.9 40.0 39.5  MCV 85.4 87.0 85.7  PLT 146 151 150   Lipid Panel: Recent Labs    10/05/16 0957 08/29/17 1009  CHOL 189 198  HDL 56 61  LDLCALC 109*  --   TRIG 118 113  CHOLHDL 3.4 3.2   TSH: Recent Labs    10/05/16 0957 02/10/17 1219 08/29/17 1009  TSH 6.18* 3.59 5.63*   A1C: Lab Results  Component Value  Date   HGBA1C  07/23/2007    5.7 (NOTE)   The ADA recommends the following therapeutic goals for glycemic   control related to Hgb A1C measurement:   Goal of Therapy:   < 7.0% Hgb A1C   Action Suggested:  > 8.0% Hgb A1C   Ref:  Diabetes Care, 22, Suppl. 1, 1999     Assessment/Plan 1. Essential hypertension -to hold indapamide today -encouraged to increase fluids this afternoon (gatorade due to diarrhea) -discussed taking losartan and metoprolol in the am and taking carduara and indapamide in the evening starting tomorrow - doxazosin (CARDURA) 8 MG tablet; Take 0.5 tablets (4 mg total) by mouth daily. One daily to control BP  Dispense: 90 tablet; Refill: 4  2. Constipation, unspecified constipation type -discussed using miralax every other day to avoid diarrhea.  -to increase fluids   Next appt: follow up blood pressure 09/08/2017  Emeline Simpson K. Biagio Borg  [facility] [phone] 8 am - 5 pm) [phone] (after hours)

## 2017-09-05 NOTE — Patient Instructions (Addendum)
HOLD indapamide today  Starting tomorrow take losartan, metoprolol- in the morning- and then take indapamide and Cardura 4 mg (half tablet) in the evening  Follow up on Friday for blood pressure with Yamilka Lopiccolo   Increase fluids this evening and tomorrow- Gatorade for electrolytes

## 2017-09-08 ENCOUNTER — Ambulatory Visit (INDEPENDENT_AMBULATORY_CARE_PROVIDER_SITE_OTHER): Payer: Medicare Other | Admitting: Nurse Practitioner

## 2017-09-08 ENCOUNTER — Encounter: Payer: Self-pay | Admitting: Nurse Practitioner

## 2017-09-08 VITALS — BP 134/84 | HR 61 | Temp 98.4°F | Ht 63.0 in | Wt 121.0 lb

## 2017-09-08 DIAGNOSIS — I10 Essential (primary) hypertension: Secondary | ICD-10-CM | POA: Diagnosis not present

## 2017-09-08 MED ORDER — DOXAZOSIN MESYLATE 4 MG PO TABS: 4.0000 mg | ORAL_TABLET | Freq: Every day | ORAL | Status: DC

## 2017-09-08 NOTE — Progress Notes (Signed)
Careteam: Patient Care Team: Kermit Balo, DO as PCP - General (Geriatric Medicine) Edmon Crape, MD as Consulting Physician (Ophthalmology) Donzetta Starch, MD as Consulting Physician (Dermatology) Hart Carwin, MD (Inactive) as Consulting Physician (Gastroenterology)  Advanced Directive information    Allergies  Allergen Reactions  . Ambien [Zolpidem Tartrate]     hallucination  . Bactrim [Sulfamethoxazole-Trimethoprim]     Unknown reaction   . Morphine And Related Nausea And Vomiting  . Zithromax [Azithromycin]     Not effective      Chief Complaint  Patient presents with  . Follow-up    Pt is being seen for a follow up on blood pressure. Pt reports that she is feeling better.      HPI: Patient is a 84 y.o. female seen in the office today to follow up blood pressure.  Pt was seen 09/05/17 due to not feeling well and noted to have low blood pressure. She has also had variable blood pressure in the past where her blood pressure will get very high occasionally. Blood pressure medications were divided to morning and afternoon and cardura was decreased to 4 mg daily from 8 mg.  Does not take blood pressure at home because they are not accurate and it works her up.  Overall feeling better- rested the next day after her visit and drank Gatorade. Went and had spaghetti last night   Has been drinking more apple juice and eating apples and trying to increase fiber in diet to help with constipation.  Had not had a BM since last OV.  Using miralax daily is too much.   Review of Systems:  Review of Systems  Constitutional: Negative for chills, fever and malaise/fatigue.  HENT: Negative for congestion and hearing loss.   Eyes: Positive for blurred vision. Negative for redness.       Macular degeneration, glasses, visions related  Respiratory: Negative for shortness of breath.   Cardiovascular: Negative for chest pain, palpitations and leg swelling.  Gastrointestinal:  Negative for abdominal pain, blood in stool, constipation, diarrhea and melena.  Genitourinary: Negative for dysuria.  Musculoskeletal: Negative for falls and myalgias.  Skin: Negative for itching and rash.  Neurological: Negative for dizziness, loss of consciousness, weakness and headaches.  Psychiatric/Behavioral: Negative for depression and memory loss. The patient is nervous/anxious and has insomnia.    Past Medical History:  Diagnosis Date  . Allergic rhinitis due to pollen   . Anxiety state, unspecified   . Calculus of kidney   . Cataract   . Diaphragmatic hernia without mention of obstruction or gangrene   . Diffuse cystic mastopathy   . Dizziness and giddiness   . GERD (gastroesophageal reflux disease)   . Hiatal hernia   . Insomnia, unspecified   . Insomnia, unspecified   . Lumbago   . Macular degeneration (senile) of retina, unspecified   . Open wound of toe(s), without mention of complication   . Osteoarthrosis, unspecified whether generalized or localized, unspecified site   . Other and unspecified hyperlipidemia   . Personal history of fall   . Sciatica   . Sebaceous cyst   . Senile osteoporosis   . Unspecified constipation   . Unspecified essential hypertension   . Unspecified hereditary and idiopathic peripheral neuropathy   . Unspecified hypothyroidism   . Unspecified tinnitus    Past Surgical History:  Procedure Laterality Date  . CATARACT EXTRACTION, BILATERAL    . CHOLECYSTECTOMY  07/25/2007  . TONSILLECTOMY AND ADENOIDECTOMY Bilateral  1943   Social History:   reports that  has never smoked. she has never used smokeless tobacco. She reports that she does not drink alcohol or use drugs.  Family History  Problem Relation Age of Onset  . Hypertension Brother   . Fibromyalgia Daughter   . Hypertension Son   . Hypertension Brother   . Hyperlipidemia Brother   . Breast cancer Neg Hx     Medications: Patient's Medications  New Prescriptions   No  medications on file  Previous Medications   ACETAMINOPHEN (TYLENOL) 500 MG TABLET    Take 500 mg by mouth every 4 (four) hours as needed.   ALPRAZOLAM (XANAX) 1 MG TABLET    TAKE 1 TABLET BY MOUTH THREE TIMES A DAY AS NEEDED FOR ANXIETY   CYANOCOBALAMIN (VITAMIN B-12 PO)    Take 1 tablet by mouth daily. Vitamin B-12   DOXAZOSIN (CARDURA) 8 MG TABLET    Take 0.5 tablets (4 mg total) by mouth daily. One daily to control BP   INDAPAMIDE (LOZOL) 1.25 MG TABLET    One daily to help control BP   KLOR-CON 10 10 MEQ TABLET    TAKE 1 TABLET BY MOUTH EVERY DAY   LEVOTHYROXINE (SYNTHROID, LEVOTHROID) 100 MCG TABLET    Take 1 tablet (100 mcg total) by mouth daily before breakfast.   LORATADINE (CLARITIN) 10 MG TABLET    Take 10 mg by mouth daily.    LOSARTAN (COZAAR) 100 MG TABLET    One daily to control BP   METOPROLOL SUCCINATE (TOPROL-XL) 100 MG 24 HR TABLET    TAKE 1 TABLET BY MOUTH EVERY DAY WITH A MEAL TO CONTROL BLOOD PRESSURE   MULTIPLE VITAMINS-MINERALS (CENTRUM SILVER) TABLET    Take 1 tablet by mouth daily.   PANTOPRAZOLE (PROTONIX) 40 MG TABLET    Take 1 tablet (40 mg total) by mouth daily.   POLYETHYLENE GLYCOL (MIRALAX / GLYCOLAX) PACKET    Take 17 g by mouth daily.    PROPYLENE GLYCOL (SYSTANE BALANCE) 0.6 % SOLN    Place 1 drop into both eyes 2 (two) times daily.   SERTRALINE (ZOLOFT) 50 MG TABLET    TAKE 1 TABLET BY MOUTH EVERY DAY TO CALM NERVES   TEMAZEPAM (RESTORIL) 30 MG CAPSULE    Take 1 capsule (30 mg total) by mouth at bedtime as needed. for sleep  Modified Medications   No medications on file  Discontinued Medications   No medications on file     Physical Exam:  Vitals:   09/08/17 1040  BP: 134/84  Pulse: 61  Temp: 98.4 F (36.9 C)  TempSrc: Oral  SpO2: 97%  Weight: 121 lb (54.9 kg)  Height: 5\' 3"  (1.6 m)   Body mass index is 21.43 kg/m.  Physical Exam  Constitutional: She is oriented to person, place, and time. She appears well-developed and well-nourished.    HENT:  Head: Normocephalic and atraumatic.  Mouth/Throat: Oropharynx is clear and moist. No oropharyngeal exudate.  Eyes: Pupils are equal, round, and reactive to light.  Neck: Normal range of motion. Neck supple.  Cardiovascular: Normal rate, regular rhythm and normal heart sounds.  Pulmonary/Chest: Effort normal and breath sounds normal. No respiratory distress.  Abdominal: Soft. Bowel sounds are normal. She exhibits no distension. There is no tenderness.  Musculoskeletal: Normal range of motion.  Neurological: She is alert and oriented to person, place, and time. She displays normal reflexes. No cranial nerve deficit. Coordination normal.  Skin: Skin is warm  and dry. Capillary refill takes less than 2 seconds.  Psychiatric: She has a normal mood and affect.    Labs reviewed: Basic Metabolic Panel: Recent Labs    10/05/16 0957 02/10/17 1219 08/29/17 1009  NA 132* 131* 136  K 3.7 4.3 3.6  CL 90* 88* 92*  CO2 34* 32* 37*  GLUCOSE 93 79 100*  BUN 10 13 16   CREATININE 0.73 0.76 0.84  CALCIUM 9.2 9.3 9.8  TSH 6.18* 3.59 5.63*   Liver Function Tests: Recent Labs    08/29/17 1009  AST 14  ALT 9  BILITOT 0.6  PROT 6.3   No results for input(s): LIPASE, AMYLASE in the last 8760 hours. No results for input(s): AMMONIA in the last 8760 hours. CBC: Recent Labs    10/05/16 0957 02/10/17 1219 08/29/17 1009  WBC 4.3 4.6 3.9  NEUTROABS 3,225 3,266 2,687  HGB 12.6 13.2 12.9  HCT 37.9 40.0 39.5  MCV 85.4 87.0 85.7  PLT 146 151 150   Lipid Panel: Recent Labs    10/05/16 0957 08/29/17 1009  CHOL 189 198  HDL 56 61  LDLCALC 109*  --   TRIG 118 113  CHOLHDL 3.4 3.2   TSH: Recent Labs    10/05/16 0957 02/10/17 1219 08/29/17 1009  TSH 6.18* 3.59 5.63*   A1C: Lab Results  Component Value Date   HGBA1C  07/23/2007    5.7 (NOTE)   The ADA recommends the following therapeutic goals for glycemic   control related to Hgb A1C measurement:   Goal of Therapy:   <  7.0% Hgb A1C   Action Suggested:  > 8.0% Hgb A1C   Ref:  Diabetes Care, 22, Suppl. 1, 1999     Assessment/Plan 1. Essential hypertension -blood pressure stable, will cont current regimen with indapamide, metoprolol, losartan and Cardura.  - doxazosin (CARDURA) 4 MG tablet; Take 1 tablet (4 mg total) by mouth daily. One daily to control BP  Next appt: 1 month for blood pressure recheck.  Janene HarveyJessica K. Biagio BorgEubanks, AGNP  [facility] [phone](Monday-Friday 8 am - 5 pm) [phone] (after hours)

## 2017-09-08 NOTE — Patient Instructions (Addendum)
Cont all medication as prescribed.   Cut miralax back- 1 capful every other day or 1/2 capful every day

## 2017-09-11 ENCOUNTER — Other Ambulatory Visit: Payer: Self-pay

## 2017-09-11 DIAGNOSIS — I10 Essential (primary) hypertension: Secondary | ICD-10-CM

## 2017-09-11 MED ORDER — DOXAZOSIN MESYLATE 4 MG PO TABS: 4.0000 mg | ORAL_TABLET | Freq: Every day | ORAL | 1 refills | Status: DC

## 2017-09-11 NOTE — Telephone Encounter (Signed)
Patient requested rx for Cardura, 8 mg tablet is not scored and hard to cut in half.  RX sent to [facility]

## 2017-10-06 ENCOUNTER — Other Ambulatory Visit: Payer: Self-pay | Admitting: Internal Medicine

## 2017-10-06 DIAGNOSIS — I10 Essential (primary) hypertension: Secondary | ICD-10-CM

## 2017-10-10 ENCOUNTER — Ambulatory Visit: Payer: Medicare Other

## 2017-10-11 ENCOUNTER — Encounter: Payer: Self-pay | Admitting: Nurse Practitioner

## 2017-10-11 ENCOUNTER — Ambulatory Visit (INDEPENDENT_AMBULATORY_CARE_PROVIDER_SITE_OTHER): Payer: Medicare Other | Admitting: Nurse Practitioner

## 2017-10-11 VITALS — BP 132/78 | HR 76 | Temp 98.2°F | Ht 63.0 in | Wt 120.0 lb

## 2017-10-11 DIAGNOSIS — F411 Generalized anxiety disorder: Secondary | ICD-10-CM

## 2017-10-11 DIAGNOSIS — G4700 Insomnia, unspecified: Secondary | ICD-10-CM | POA: Diagnosis not present

## 2017-10-11 DIAGNOSIS — I10 Essential (primary) hypertension: Secondary | ICD-10-CM | POA: Diagnosis not present

## 2017-10-11 MED ORDER — ALPRAZOLAM 0.5 MG PO TABS
0.5000 mg | ORAL_TABLET | Freq: Two times a day (BID) | ORAL | 0 refills | Status: DC | PRN
Start: 2017-10-11 — End: 2017-11-06

## 2017-10-11 NOTE — Patient Instructions (Signed)
Decreasing xanax to 0.5 mg tablet twice daily as needed anxiety  To use melatonin 3 mg by mouth at bedtime for sleep- can increase to 2 tablets if needed   Continue all blood pressure medication as you are taking

## 2017-10-11 NOTE — Progress Notes (Signed)
Careteam: Patient Care Team: Kermit Baloeed, Tiffany L, DO as PCP - General (Geriatric Medicine) Edmon Crapeankin, Gary A, MD as Consulting Physician (Ophthalmology) Donzetta StarchJones, Drew, MD as Consulting Physician (Dermatology) Hart CarwinBrodie, Dora M, MD (Inactive) as Consulting Physician (Gastroenterology)  Advanced Directive information    Allergies  Allergen Reactions  . Ambien [Zolpidem Tartrate]     hallucination  . Bactrim [Sulfamethoxazole-Trimethoprim]     Unknown reaction   . Morphine And Related Nausea And Vomiting  . Zithromax [Azithromycin]     Not effective      Chief Complaint  Patient presents with  . Follow-up    Pt is being seen for a follow up on blood pressure.   . Medication Refill    Rx pended for alprazolam. [place] [facility] has been verified.      HPI: Patient is a 84 y.o. female seen in the office today to follow up blood pressure.  Last month had an episode of feeling faint and low blood pressure after episode of diarrhea x 2.  cardura was decreased to 4 mg daily from 8 mg and blood pressure improved. Pt with hx of labile blood pressure and she does not take at home because it makes her anxious therefore blood pressure goes up so she wanted to come in today to make sure blood pressure was remaining in appropriate range.   Review of Systems:  Review of Systems  Constitutional: Negative for chills, fever and malaise/fatigue.  HENT: Negative for congestion and hearing loss.   Eyes: Positive for blurred vision. Negative for redness.       Macular degeneration, wears glasses  Respiratory: Negative for shortness of breath.   Cardiovascular: Negative for chest pain, palpitations and leg swelling.  Gastrointestinal: Negative for abdominal pain, constipation and diarrhea.  Genitourinary: Negative for dysuria.  Musculoskeletal: Negative for falls and myalgias.  Skin: Negative for itching and rash.  Neurological: Negative for dizziness, loss of consciousness, weakness and headaches.    Psychiatric/Behavioral: Negative for depression and memory loss. The patient is nervous/anxious and has insomnia.     Past Medical History:  Diagnosis Date  . Allergic rhinitis due to pollen   . Anxiety state, unspecified   . Calculus of kidney   . Cataract   . Diaphragmatic hernia without mention of obstruction or gangrene   . Diffuse cystic mastopathy   . Dizziness and giddiness   . GERD (gastroesophageal reflux disease)   . Hiatal hernia   . Insomnia, unspecified   . Insomnia, unspecified   . Lumbago   . Macular degeneration (senile) of retina, unspecified   . Open wound of toe(s), without mention of complication   . Osteoarthrosis, unspecified whether generalized or localized, unspecified site   . Other and unspecified hyperlipidemia   . Personal history of fall   . Sciatica   . Sebaceous cyst   . Senile osteoporosis   . Unspecified constipation   . Unspecified essential hypertension   . Unspecified hereditary and idiopathic peripheral neuropathy   . Unspecified hypothyroidism   . Unspecified tinnitus    Past Surgical History:  Procedure Laterality Date  . CATARACT EXTRACTION, BILATERAL    . CHOLECYSTECTOMY  07/25/2007  . TONSILLECTOMY AND ADENOIDECTOMY Bilateral 1943   Social History:   reports that  has never smoked. she has never used smokeless tobacco. She reports that she does not drink alcohol or use drugs.  Family History  Problem Relation Age of Onset  . Hypertension Brother   . Fibromyalgia Daughter   .  Hypertension Son   . Hypertension Brother   . Hyperlipidemia Brother   . Breast cancer Neg Hx     Medications: Patient's Medications  New Prescriptions   No medications on file  Previous Medications   ACETAMINOPHEN (TYLENOL) 500 MG TABLET    Take 500 mg by mouth every 4 (four) hours as needed.   ALPRAZOLAM (XANAX) 1 MG TABLET    TAKE 1 TABLET BY MOUTH THREE TIMES A DAY AS NEEDED FOR ANXIETY   CYANOCOBALAMIN (VITAMIN B-12 PO)    Take 1 tablet by  mouth daily. Vitamin B-12   DOXAZOSIN (CARDURA) 4 MG TABLET    Take 1 tablet (4 mg total) by mouth daily. One daily to control BP   INDAPAMIDE (LOZOL) 1.25 MG TABLET    One daily to help control BP   KLOR-CON 10 10 MEQ TABLET    TAKE 1 TABLET BY MOUTH EVERY DAY   LEVOTHYROXINE (SYNTHROID, LEVOTHROID) 100 MCG TABLET    Take 1 tablet (100 mcg total) by mouth daily before breakfast.   LORATADINE (CLARITIN) 10 MG TABLET    Take 10 mg by mouth daily.    LOSARTAN (COZAAR) 100 MG TABLET    One daily to control BP   METOPROLOL SUCCINATE (TOPROL-XL) 100 MG 24 HR TABLET    TAKE 1 TABLET BY MOUTH EVERY DAY WITH A MEAL TO CONTROL BLOOD PRESSURE   MULTIPLE VITAMINS-MINERALS (CENTRUM SILVER) TABLET    Take 1 tablet by mouth daily.   PANTOPRAZOLE (PROTONIX) 40 MG TABLET    Take 1 tablet (40 mg total) by mouth daily.   POLYETHYLENE GLYCOL (MIRALAX / GLYCOLAX) PACKET    Take 17 g by mouth daily.    PROPYLENE GLYCOL (SYSTANE BALANCE) 0.6 % SOLN    Place 1 drop into both eyes 2 (two) times daily.   SERTRALINE (ZOLOFT) 50 MG TABLET    TAKE 1 TABLET BY MOUTH EVERY DAY TO CALM NERVES   TEMAZEPAM (RESTORIL) 30 MG CAPSULE    Take 1 capsule (30 mg total) by mouth at bedtime as needed. for sleep  Modified Medications   No medications on file  Discontinued Medications   No medications on file     Physical Exam:  Vitals:   10/11/17 1004  BP: 132/78  Pulse: 76  Temp: 98.2 F (36.8 C)  TempSrc: Oral  SpO2: 99%  Weight: 120 lb (54.4 kg)  Height: 5\' 3"  (1.6 m)   Body mass index is 21.26 kg/m.  Physical Exam  Constitutional: She is oriented to person, place, and time. She appears well-developed and well-nourished.  HENT:  Head: Normocephalic and atraumatic.  Neck: No JVD present.  Cardiovascular: Normal rate, regular rhythm and normal heart sounds.  Pulmonary/Chest: Effort normal and breath sounds normal. No respiratory distress.  Neurological: She is alert and oriented to person, place, and time.  Skin:  Skin is warm and dry.  Psychiatric: She has a normal mood and affect.    Labs reviewed: Basic Metabolic Panel: Recent Labs    02/10/17 1219 08/29/17 1009  NA 131* 136  K 4.3 3.6  CL 88* 92*  CO2 32* 37*  GLUCOSE 79 100*  BUN 13 16  CREATININE 0.76 0.84  CALCIUM 9.3 9.8  TSH 3.59 5.63*   Liver Function Tests: Recent Labs    08/29/17 1009  AST 14  ALT 9  BILITOT 0.6  PROT 6.3   No results for input(s): LIPASE, AMYLASE in the last 8760 hours. No results for input(s): AMMONIA  in the last 8760 hours. CBC: Recent Labs    02/10/17 1219 08/29/17 1009  WBC 4.6 3.9  NEUTROABS 3,266 2,687  HGB 13.2 12.9  HCT 40.0 39.5  MCV 87.0 85.7  PLT 151 150   Lipid Panel: Recent Labs    08/29/17 1009  CHOL 198  HDL 61  TRIG 113  CHOLHDL 3.2   TSH: Recent Labs    02/10/17 1219 08/29/17 1009  TSH 3.59 5.63*   A1C: Lab Results  Component Value Date   HGBA1C  07/23/2007    5.7 (NOTE)   The ADA recommends the following therapeutic goals for glycemic   control related to Hgb A1C measurement:   Goal of Therapy:   < 7.0% Hgb A1C   Action Suggested:  > 8.0% Hgb A1C   Ref:  Diabetes Care, 22, Suppl. 1, 1999     Assessment/Plan 1. Essential hypertension Controlled on current regimen.   2. Insomnia, unspecified type continues on temazepam 30 mg nightly for sleep also using xanax 1 mg to help with sleep, recommended to reduce xanax to 0.5 mg by mouth at bedtime and to use melatonin 3 mg qhs to help   3. Anxiety state -ongoing, continues on zoloft 50 mg and xanax PRN -she is taking xanax twice daily- taking 1/2 tablet of the 1 mg tablet during the day and 1 tablet at night to help sleep. Will reduce xanax to 0.5 mg tablet twice daily PRN  - ALPRAZolam (XANAX) 0.5 MG tablet; Take 1 tablet (0.5 mg total) by mouth 2 (two) times daily as needed for anxiety.  Dispense: 60 tablet; Refill: 0  Next appt: as scheduled with DR Reeed Janene Harvey. Biagio Borg  [facility] [phone] 8 am - 5 pm) [phone] (after hours)

## 2017-10-19 DIAGNOSIS — H353212 Exudative age-related macular degeneration, right eye, with inactive choroidal neovascularization: Secondary | ICD-10-CM | POA: Diagnosis not present

## 2017-10-19 DIAGNOSIS — H353113 Nonexudative age-related macular degeneration, right eye, advanced atrophic without subfoveal involvement: Secondary | ICD-10-CM | POA: Diagnosis not present

## 2017-10-19 DIAGNOSIS — H353222 Exudative age-related macular degeneration, left eye, with inactive choroidal neovascularization: Secondary | ICD-10-CM | POA: Diagnosis not present

## 2017-10-19 DIAGNOSIS — H353124 Nonexudative age-related macular degeneration, left eye, advanced atrophic with subfoveal involvement: Secondary | ICD-10-CM | POA: Diagnosis not present

## 2017-11-06 ENCOUNTER — Encounter: Payer: Self-pay | Admitting: Internal Medicine

## 2017-11-06 ENCOUNTER — Ambulatory Visit (INDEPENDENT_AMBULATORY_CARE_PROVIDER_SITE_OTHER): Payer: Medicare Other | Admitting: Internal Medicine

## 2017-11-06 VITALS — BP 120/74 | HR 60 | Temp 97.7°F | Ht 63.0 in | Wt 118.0 lb

## 2017-11-06 DIAGNOSIS — F411 Generalized anxiety disorder: Secondary | ICD-10-CM | POA: Diagnosis not present

## 2017-11-06 DIAGNOSIS — G4700 Insomnia, unspecified: Secondary | ICD-10-CM

## 2017-11-06 DIAGNOSIS — R634 Abnormal weight loss: Secondary | ICD-10-CM | POA: Diagnosis not present

## 2017-11-06 DIAGNOSIS — I10 Essential (primary) hypertension: Secondary | ICD-10-CM

## 2017-11-06 DIAGNOSIS — E782 Mixed hyperlipidemia: Secondary | ICD-10-CM

## 2017-11-06 MED ORDER — ALPRAZOLAM 0.5 MG PO TABS: 0.5000 mg | ORAL_TABLET | Freq: Three times a day (TID) | ORAL | 0 refills | Status: DC | PRN

## 2017-11-06 MED ORDER — SERTRALINE HCL 50 MG PO TABS: ORAL_TABLET | ORAL | 3 refills | Status: DC

## 2017-11-06 NOTE — Progress Notes (Signed)
Location:  [facility] Provider:  Tiffany L. Renato Gails, D.O., C.M.D.  Code Status: Full Code Goals of Care:  Advanced Directives 08/31/2017  Does Patient Have a Medical Advance Directive? Yes  Type of Advance Directive Living will;Out of facility DNR (pink MOST or yellow form)  Does patient want to make changes to medical advance directive? No - Patient declined  Copy of Healthcare Power of Attorney in Chart? -  Would patient like information on creating a medical advance directive? -  Pre-existing out of facility DNR order (yellow form or pink MOST form) Yellow form placed in chart (order not valid for inpatient use)     Chief Complaint  Patient presents with  . Follow-up    Follow-up on Xanax, discuss changing instructions. Patient would like more than what is currently prescribed. Patient also c/o weight loss   . Advance Care Planning    DNR and Living Will   . Medication Refill    Refill Zoloft     HPI: Patient is a 84 y.o. female seen today for medical management of chronic diseases.    She reports increase in gas and bloating. She has stopped miralax. She started using beano, which has helped with gas. Gallbladder removal creates a hard time with food choices. She avoids raw fresh veggies. She also stopped with cheese to help with gas. She has had a nervous stomach since she was 84 years old. She is worried due to reduction in diet about her weight loss. She knows she is not eating as much, and tries to eat more in afternoon like banana or apple. She is thinking she needs more.  Eye sight is worsening. This is stressful to her. She is worried about how this is impacting her daily life. She is not cooking anymore. She has been taking Xanax As per Dr green and she was taking 1 mg up to three times a day as needed. Jessica, reduce mg to 0.5 and ordered it twice a day. But she needs more as she still is waking up at night. She wakes up 3-5 times a month and this is when she takes the  extra half. She also reports having issues with her stomach and when she has diarrhea she needs help getting back to sleep.   Past Medical History:  Diagnosis Date  . Allergic rhinitis due to pollen   . Anxiety state, unspecified   . Calculus of kidney   . Cataract   . Diaphragmatic hernia without mention of obstruction or gangrene   . Diffuse cystic mastopathy   . Dizziness and giddiness   . GERD (gastroesophageal reflux disease)   . Hiatal hernia   . Insomnia, unspecified   . Insomnia, unspecified   . Lumbago   . Macular degeneration (senile) of retina, unspecified   . Open wound of toe(s), without mention of complication   . Osteoarthrosis, unspecified whether generalized or localized, unspecified site   . Other and unspecified hyperlipidemia   . Personal history of fall   . Sciatica   . Sebaceous cyst   . Senile osteoporosis   . Unspecified constipation   . Unspecified essential hypertension   . Unspecified hereditary and idiopathic peripheral neuropathy   . Unspecified hypothyroidism   . Unspecified tinnitus     Past Surgical History:  Procedure Laterality Date  . CATARACT EXTRACTION, BILATERAL    . CHOLECYSTECTOMY  07/25/2007  . TONSILLECTOMY AND ADENOIDECTOMY Bilateral 1943    Allergies  Allergen Reactions  . Ambien [  Zolpidem Tartrate]     hallucination  . Bactrim [Sulfamethoxazole-Trimethoprim]     Unknown reaction   . Morphine And Related Nausea And Vomiting  . Zithromax [Azithromycin]     Not effective      Outpatient Encounter Medications as of 11/06/2017  Medication Sig  . acetaminophen (TYLENOL) 500 MG tablet Take 500 mg by mouth every 4 (four) hours as needed.  . Alpha-D-Galactosidase (BEANO) TABS Take by mouth. Take daily before meals  . ALPRAZolam (XANAX) 0.5 MG tablet Take 1 tablet (0.5 mg total) by mouth 2 (two) times daily as needed for anxiety.  . Cyanocobalamin (VITAMIN B-12 PO) Take 1 tablet by mouth daily. Vitamin B-12  . doxazosin  (CARDURA) 4 MG tablet Take 1 tablet (4 mg total) by mouth daily. One daily to control BP  . indapamide (LOZOL) 1.25 MG tablet One daily to help control BP  . KLOR-CON 10 10 MEQ tablet TAKE 1 TABLET BY MOUTH EVERY DAY  . levothyroxine (SYNTHROID, LEVOTHROID) 100 MCG tablet Take 1 tablet (100 mcg total) by mouth daily before breakfast.  . loratadine (CLARITIN) 10 MG tablet Take 10 mg by mouth daily.   Marland Kitchen losartan (COZAAR) 100 MG tablet One daily to control BP  . metoprolol succinate (TOPROL-XL) 100 MG 24 hr tablet TAKE 1 TABLET BY MOUTH EVERY DAY WITH A MEAL TO CONTROL BLOOD PRESSURE  . Multiple Vitamins-Minerals (CENTRUM SILVER) tablet Take 1 tablet by mouth daily.  . pantoprazole (PROTONIX) 40 MG tablet Take 1 tablet (40 mg total) by mouth daily.  . polyethylene glycol (MIRALAX / GLYCOLAX) packet Take 17 g by mouth daily.   Marland Kitchen Propylene Glycol (SYSTANE BALANCE) 0.6 % SOLN Place 1 drop into both eyes 2 (two) times daily.  . sertraline (ZOLOFT) 50 MG tablet TAKE 1 TABLET BY MOUTH EVERY DAY TO CALM NERVES  . temazepam (RESTORIL) 30 MG capsule Take 1 capsule (30 mg total) by mouth at bedtime as needed. for sleep   No facility-administered encounter medications on file as of 11/06/2017.     Review of Systems:  Review of Systems  Constitutional: Negative for chills, fever and malaise/fatigue.  Eyes:       Glasses  Respiratory: Negative for cough and shortness of breath.   Cardiovascular: Negative for chest pain, palpitations and leg swelling.  Gastrointestinal: Positive for abdominal pain, constipation and diarrhea. Negative for blood in stool, heartburn, nausea and vomiting.  Genitourinary: Negative for frequency and urgency.  Musculoskeletal: Positive for back pain and joint pain. Negative for falls.       Cane   Neurological: Negative for dizziness and headaches.  Psychiatric/Behavioral: Negative for depression and memory loss. The patient is nervous/anxious and has insomnia.     Health  Maintenance  Topic Date Due  . TETANUS/TDAP  06/16/2027  . INFLUENZA VACCINE  Completed  . DEXA SCAN  Completed  . PNA vac Low Risk Adult  Completed    Physical Exam: Vitals:   11/06/17 1054  BP: 120/74  Pulse: 60  Temp: 97.7 F (36.5 C)  TempSrc: Oral  SpO2: 95%  Weight: 118 lb (53.5 kg)  Height: 5\' 3"  (1.6 m)   Body mass index is 20.9 kg/m. Physical Exam  Constitutional: She is oriented to person, place, and time. She appears well-developed and well-nourished.  Cardiovascular: Normal rate, regular rhythm, normal heart sounds and intact distal pulses.  Pulmonary/Chest: Effort normal and breath sounds normal.  Abdominal: Soft. Bowel sounds are normal.  Musculoskeletal: Normal range of motion.  Neurological:  She is alert and oriented to person, place, and time.  Skin: Skin is warm. Capillary refill takes less than 2 seconds.  Psychiatric: She has a normal mood and affect. Her behavior is normal. Judgment and thought content normal.  Vitals reviewed.   Labs reviewed: Basic Metabolic Panel: Recent Labs    02/10/17 1219 08/29/17 1009  NA 131* 136  K 4.3 3.6  CL 88* 92*  CO2 32* 37*  GLUCOSE 79 100*  BUN 13 16  CREATININE 0.76 0.84  CALCIUM 9.3 9.8  TSH 3.59 5.63*   Liver Function Tests: Recent Labs    08/29/17 1009  AST 14  ALT 9  BILITOT 0.6  PROT 6.3   No results for input(s): LIPASE, AMYLASE in the last 8760 hours. No results for input(s): AMMONIA in the last 8760 hours. CBC: Recent Labs    02/10/17 1219 08/29/17 1009  WBC 4.6 3.9  NEUTROABS 3,266 2,687  HGB 13.2 12.9  HCT 40.0 39.5  MCV 87.0 85.7  PLT 151 150   Lipid Panel: Recent Labs    08/29/17 1009  CHOL 198  HDL 61  LDLCALC 115*  TRIG 113  CHOLHDL 3.2   Lab Results  Component Value Date   HGBA1C  07/23/2007    5.7 (NOTE)   The ADA recommends the following therapeutic goals for glycemic   control related to Hgb A1C measurement:   Goal of Therapy:   < 7.0% Hgb A1C   Action  Suggested:  > 8.0% Hgb A1C   Ref:  Diabetes Care, 22, Suppl. 1, 1999    Procedures since last visit: No results found.  Assessment/Plan 1. Insomnia, unspecified type She reports sleeping well overall, but she uses  Melatonin as well as xanax when need to help get back to sleep after stomach trouble. This has been on going and she has used xanax since she was a patient of Dr green.   - sertraline (ZOLOFT) 50 MG tablet; TAKE 1 TABLET BY MOUTH EVERY DAY TO CALM NERVES  Dispense: 90 tablet; Refill: 3  2. Anxiety state She has done well on the reduction of mg with xanax but states she needs it to be ordered three times daily to help incase she wakes up after her stomach gives her trouble. It seems she has a form of IBS and this causes her great discuss.  - sertraline (ZOLOFT) 50 MG tablet; TAKE 1 TABLET BY MOUTH EVERY DAY TO CALM NERVES  Dispense: 90 tablet; Refill: 3 - ALPRAZolam (XANAX) 0.5 MG tablet; Take 1 tablet (0.5 mg total) by mouth 3 (three) times daily as needed for anxiety.  Dispense: 90 tablet; Refill: 0  3. Essential hypertension Her BP is good, she reports changing two of her medications in the afternoon. This is making her feel better and not lightheaded like she was. Encouraged her to continue this.   4. Weight loss She has lost two pounds and is worried. As detailed in HPI she has not been able to eat as she was when she lived at home. She is limited on choices at [facility] as well as foods her system can tolerate. Encourage the use of boost or ensure. Provider her samples today in office.  5. Mixed hyperlipidemia Does not tolerate statin therapy. Encourage diet control. Will assess in future labs.  - Lipid panel; Future  Labs/tests ordered:   Orders Placed This Encounter  Procedures  . CBC with Differential/Platelet    Standing Status:   Future  Standing Expiration Date:   07/09/2018  . COMPLETE METABOLIC PANEL WITH GFR    Standing Status:   Future    Standing  Expiration Date:   07/09/2018  . Lipid panel    Standing Status:   Future    Standing Expiration Date:   07/09/2018     Next appt:  12/27/2017   Rosine Door, RN, AGPCNP, DNP Student Geriatrics [facility] [phone], [place] [address] Cell Phone (Mon-Fri 8am-5pm):  [phone] On Call:  [phone] & follow prompts after 5pm & weekends Office Phone:  [phone] Office Fax:  [phone]

## 2017-11-06 NOTE — Patient Instructions (Signed)
Start drinking a protein supplement-ensure or boost.

## 2017-11-07 ENCOUNTER — Other Ambulatory Visit: Payer: Self-pay | Admitting: Internal Medicine

## 2017-11-07 DIAGNOSIS — I10 Essential (primary) hypertension: Secondary | ICD-10-CM

## 2017-11-20 ENCOUNTER — Encounter: Payer: Self-pay | Admitting: Nurse Practitioner

## 2017-11-20 ENCOUNTER — Ambulatory Visit (INDEPENDENT_AMBULATORY_CARE_PROVIDER_SITE_OTHER): Payer: Medicare Other | Admitting: Nurse Practitioner

## 2017-11-20 VITALS — BP 154/80 | HR 73 | Temp 98.5°F | Ht 63.0 in | Wt 119.0 lb

## 2017-11-20 DIAGNOSIS — R634 Abnormal weight loss: Secondary | ICD-10-CM

## 2017-11-20 DIAGNOSIS — I10 Essential (primary) hypertension: Secondary | ICD-10-CM

## 2017-11-20 NOTE — Progress Notes (Signed)
Careteam: Patient Care Team: Kermit Balo, DO as PCP - General (Geriatric Medicine) Edmon Crape, MD as Consulting Physician (Ophthalmology) Donzetta Starch, MD as Consulting Physician (Dermatology) Hart Carwin, MD (Inactive) as Consulting Physician (Gastroenterology)  Advanced Directive information Does Patient Have a Medical Advance Directive?: Yes, Type of Advance Directive: Living will;Out of facility DNR (pink MOST or yellow form);Healthcare Power of Attorney, Pre-existing out of facility DNR order (yellow form or pink MOST form): Yellow form placed in chart (order not valid for inpatient use), Does patient want to make changes to medical advance directive?: No - Patient declined  Allergies  Allergen Reactions  . Ambien [Zolpidem Tartrate]     hallucination  . Bactrim [Sulfamethoxazole-Trimethoprim]     Unknown reaction   . Morphine And Related Nausea And Vomiting  . Zithromax [Azithromycin]     Not effective      Chief Complaint  Patient presents with  . Acute Visit    Pt is being seen for a blood pressure check and weight check. Pt questions if she really needs to take 100 mg losartan and metoprolol daily.    . ACP    copy of HCPOA requested     HPI: Patient is a 84 y.o. female seen in the office today to follow up blood pressure and weight. Noted ongoing weight loss at last OV, she is now at [facility] and reports limited food choices. Reports they actually have a lot of food but due to her diet restrictions due to stomach she is limited. Has also altered diet due to gas/bloating. Using probiotic. Reports not able to eat a lot of meat there because it gives her diarrhea.  Beef stew, spaghetti and one of the chicken casserole she was not able to tolerate.   Had gallbladder removed and had a sensitive stomach since then.   Weight up 1 lb since last visit, was recommended to take protein supplement/boost. Boost went right through her Daughter found a lactose  free protein shake that she is trying and yogurt   Reports she only took losartan 50 mg daily because she questions if she needs the whole 100 mg dose.   Review of Systems:  Review of Systems  Constitutional: Negative for chills and fever.  Respiratory: Negative for shortness of breath.   Cardiovascular: Negative for chest pain.  Gastrointestinal: Negative for abdominal pain, constipation, diarrhea and heartburn.  Neurological: Negative for dizziness and headaches.    Past Medical History:  Diagnosis Date  . Allergic rhinitis due to pollen   . Anxiety state, unspecified   . Calculus of kidney   . Cataract   . Diaphragmatic hernia without mention of obstruction or gangrene   . Diffuse cystic mastopathy   . Dizziness and giddiness   . GERD (gastroesophageal reflux disease)   . Hiatal hernia   . Insomnia, unspecified   . Insomnia, unspecified   . Lumbago   . Macular degeneration (senile) of retina, unspecified   . Open wound of toe(s), without mention of complication   . Osteoarthrosis, unspecified whether generalized or localized, unspecified site   . Other and unspecified hyperlipidemia   . Personal history of fall   . Sciatica   . Sebaceous cyst   . Senile osteoporosis   . Unspecified constipation   . Unspecified essential hypertension   . Unspecified hereditary and idiopathic peripheral neuropathy   . Unspecified hypothyroidism   . Unspecified tinnitus    Past Surgical History:  Procedure Laterality  Date  . CATARACT EXTRACTION, BILATERAL    . CHOLECYSTECTOMY  07/25/2007  . TONSILLECTOMY AND ADENOIDECTOMY Bilateral 1943   Social History:   reports that she has never smoked. She has never used smokeless tobacco. She reports that she does not drink alcohol or use drugs.  Family History  Problem Relation Age of Onset  . Hypertension Brother   . Fibromyalgia Daughter   . Hypertension Son   . Hypertension Brother   . Hyperlipidemia Brother   . Breast cancer Neg Hx      Medications: Patient's Medications  New Prescriptions   No medications on file  Previous Medications   ACETAMINOPHEN (TYLENOL) 500 MG TABLET    Take 500 mg by mouth every 4 (four) hours as needed.   ALPHA-D-GALACTOSIDASE (BEANO) TABS    Take by mouth. Take daily before meals   ALPRAZOLAM (XANAX) 0.5 MG TABLET    Take 1 tablet (0.5 mg total) by mouth 3 (three) times daily as needed for anxiety.   CYANOCOBALAMIN (VITAMIN B-12 PO)    Take 1 tablet by mouth daily. Vitamin B-12   DOXAZOSIN (CARDURA) 4 MG TABLET    Take 1 tablet (4 mg total) by mouth daily. One daily to control BP   INDAPAMIDE (LOZOL) 1.25 MG TABLET    One daily to help control BP   KLOR-CON 10 10 MEQ TABLET    TAKE 1 TABLET BY MOUTH EVERY DAY   LEVOTHYROXINE (SYNTHROID, LEVOTHROID) 100 MCG TABLET    Take 1 tablet (100 mcg total) by mouth daily before breakfast.   LORATADINE (CLARITIN) 10 MG TABLET    Take 10 mg by mouth daily.    LOSARTAN (COZAAR) 100 MG TABLET    TAKE 1 TABLET BY MOUTH EVERY DAY TO CONTROL BLOOD PRESSURE   METOPROLOL SUCCINATE (TOPROL-XL) 100 MG 24 HR TABLET    TAKE 1 TABLET BY MOUTH EVERY DAY WITH A MEAL TO CONTROL BLOOD PRESSURE   MULTIPLE VITAMINS-MINERALS (CENTRUM SILVER) TABLET    Take 1 tablet by mouth daily.   PANTOPRAZOLE (PROTONIX) 40 MG TABLET    Take 1 tablet (40 mg total) by mouth daily.   POLYETHYLENE GLYCOL (MIRALAX / GLYCOLAX) PACKET    Take 17 g by mouth daily.    PROPYLENE GLYCOL (SYSTANE BALANCE) 0.6 % SOLN    Place 1 drop into both eyes 2 (two) times daily.   SERTRALINE (ZOLOFT) 50 MG TABLET    TAKE 1 TABLET BY MOUTH EVERY DAY TO CALM NERVES   TEMAZEPAM (RESTORIL) 30 MG CAPSULE    Take 1 capsule (30 mg total) by mouth at bedtime as needed. for sleep  Modified Medications   No medications on file  Discontinued Medications   No medications on file     Physical Exam:  Vitals:   11/20/17 1019  BP: (!) 154/80  Pulse: 73  Temp: 98.5 F (36.9 C)  TempSrc: Oral  SpO2: 99%  Weight:  119 lb (54 kg)  Height: 5\' 3"  (1.6 m)   Body mass index is 21.08 kg/m.  Physical Exam  Constitutional: She is oriented to person, place, and time. She appears well-developed and well-nourished.  HENT:  Head: Normocephalic and atraumatic.  Neck: No JVD present.  Cardiovascular: Normal rate, regular rhythm and normal heart sounds.  Pulmonary/Chest: Effort normal and breath sounds normal. No respiratory distress.  Neurological: She is alert and oriented to person, place, and time.  Skin: Skin is warm and dry.  Psychiatric: She has a normal mood and affect.  Labs reviewed: Basic Metabolic Panel: Recent Labs    02/10/17 1219 08/29/17 1009  NA 131* 136  K 4.3 3.6  CL 88* 92*  CO2 32* 37*  GLUCOSE 79 100*  BUN 13 16  CREATININE 0.76 0.84  CALCIUM 9.3 9.8  TSH 3.59 5.63*   Liver Function Tests: Recent Labs    08/29/17 1009  AST 14  ALT 9  BILITOT 0.6  PROT 6.3   No results for input(s): LIPASE, AMYLASE in the last 8760 hours. No results for input(s): AMMONIA in the last 8760 hours. CBC: Recent Labs    02/10/17 1219 08/29/17 1009  WBC 4.6 3.9  NEUTROABS 3,266 2,687  HGB 13.2 12.9  HCT 40.0 39.5  MCV 87.0 85.7  PLT 151 150   Lipid Panel: Recent Labs    08/29/17 1009  CHOL 198  HDL 61  LDLCALC 115*  TRIG 113  CHOLHDL 3.2   TSH: Recent Labs    02/10/17 1219 08/29/17 1009  TSH 3.59 5.63*   A1C: Lab Results  Component Value Date   HGBA1C  07/23/2007    5.7 (NOTE)   The ADA recommends the following therapeutic goals for glycemic   control related to Hgb A1C measurement:   Goal of Therapy:   < 7.0% Hgb A1C   Action Suggested:  > 8.0% Hgb A1C   Ref:  Diabetes Care, 22, Suppl. 1, 1999     Assessment/Plan 1. Essential hypertension -blood pressure elevated on losartan 50 mg daily, to continue to losartan 100 mg daily along with Cardura, metoprolol succinate, and indapamide.   2. Weight loss -weight stable, pt limited at [facility] due to dietary  restrictions after gallbladder removed. To continue on supplements and to find snacks/alternate meals high in protein.   Next appt: 12/27/2017 Janene Harvey. Biagio Borg  [facility] [phone]

## 2017-11-20 NOTE — Patient Instructions (Signed)
Continue to look for high protein snacks, continue supplements.   Continue all blood pressure medication as prescribed

## 2017-11-28 ENCOUNTER — Other Ambulatory Visit: Payer: Self-pay | Admitting: Internal Medicine

## 2017-12-19 ENCOUNTER — Other Ambulatory Visit: Payer: Self-pay | Admitting: Internal Medicine

## 2017-12-19 DIAGNOSIS — F411 Generalized anxiety disorder: Secondary | ICD-10-CM

## 2017-12-27 ENCOUNTER — Ambulatory Visit (INDEPENDENT_AMBULATORY_CARE_PROVIDER_SITE_OTHER): Payer: Medicare Other

## 2017-12-27 ENCOUNTER — Other Ambulatory Visit: Payer: Medicare Other

## 2017-12-27 VITALS — BP 168/80 | HR 63 | Temp 97.8°F | Ht 63.0 in | Wt 116.0 lb

## 2017-12-27 DIAGNOSIS — E039 Hypothyroidism, unspecified: Secondary | ICD-10-CM | POA: Diagnosis not present

## 2017-12-27 DIAGNOSIS — I10 Essential (primary) hypertension: Secondary | ICD-10-CM | POA: Diagnosis not present

## 2017-12-27 DIAGNOSIS — Z0000 Encounter for general adult medical examination without abnormal findings: Secondary | ICD-10-CM | POA: Diagnosis not present

## 2017-12-27 LAB — BASIC METABOLIC PANEL
BUN: 16 mg/dL (ref 7–25)
CO2: 37 mmol/L — ABNORMAL HIGH (ref 20–32)
Calcium: 9.9 mg/dL (ref 8.6–10.4)
Chloride: 88 mmol/L — ABNORMAL LOW (ref 98–110)
Creat: 0.72 mg/dL (ref 0.60–0.88)
Glucose, Bld: 109 mg/dL — ABNORMAL HIGH (ref 65–99)
Potassium: 3.5 mmol/L (ref 3.5–5.3)
Sodium: 132 mmol/L — ABNORMAL LOW (ref 135–146)

## 2017-12-27 LAB — TSH: TSH: 5.59 mIU/L — ABNORMAL HIGH (ref 0.40–4.50)

## 2017-12-27 MED ORDER — ZOSTER VAC RECOMB ADJUVANTED 50 MCG/0.5ML IM SUSR: 0.5000 mL | Freq: Once | INTRAMUSCULAR | 1 refills | Status: AC

## 2017-12-27 NOTE — Patient Instructions (Addendum)
[name] , Thank you for taking time to come for your Medicare Wellness Visit. I appreciate your ongoing commitment to your health goals. Please review the following plan we discussed and let me know if I can assist you in the future.   Screening recommendations/referrals: Colonoscopy excluded, over age 82 Mammogram excluded, over age 39 Bone Density up to date Recommended yearly ophthalmology/optometry visit for glaucoma screening and checkup Recommended yearly dental visit for hygiene and checkup  Vaccinations: Influenza vaccine up to date, due 2019 fall season Pneumococcal vaccine up to date, completed Tdap vaccine up to date, due 06/16/2027 Shingles vaccine due, ordered to pharmacy    Advanced directives: In chart  Conditions/risks identified: none  Next appointment: Dr. Renato Gails 01/01/2018 @ 10:30am            Tyron Russell, RN 12/31/2018 @ 9:15am   Preventive Care 65 Years and Older, Female Preventive care refers to lifestyle choices and visits with your health care provider that can promote health and wellness. What does preventive care include?  A yearly physical exam. This is also called an annual well check.  Dental exams once or twice a year.  Routine eye exams. Ask your health care provider how often you should have your eyes checked.  Personal lifestyle choices, including:  Daily care of your teeth and gums.  Regular physical activity.  Eating a healthy diet.  Avoiding tobacco and drug use.  Limiting alcohol use.  Practicing safe sex.  Taking low-dose aspirin every day.  Taking vitamin and mineral supplements as recommended by your health care provider. What happens during an annual well check? The services and screenings done by your health care provider during your annual well check will depend on your age, overall health, lifestyle risk factors, and family history of disease. Counseling  Your health care provider may ask you questions about  your:  Alcohol use.  Tobacco use.  Drug use.  Emotional well-being.  Home and relationship well-being.  Sexual activity.  Eating habits.  History of falls.  Memory and ability to understand (cognition).  Work and work Astronomer.  Reproductive health. Screening  You may have the following tests or measurements:  Height, weight, and BMI.  Blood pressure.  Lipid and cholesterol levels. These may be checked every 5 years, or more frequently if you are over 87 years old.  Skin check.  Lung cancer screening. You may have this screening every year starting at age 92 if you have a 30-pack-year history of smoking and currently smoke or have quit within the past 15 years.  Fecal occult blood test (FOBT) of the stool. You may have this test every year starting at age 54.  Flexible sigmoidoscopy or colonoscopy. You may have a sigmoidoscopy every 5 years or a colonoscopy every 10 years starting at age 4.  Hepatitis C blood test.  Hepatitis B blood test.  Sexually transmitted disease (STD) testing.  Diabetes screening. This is done by checking your blood sugar (glucose) after you have not eaten for a while (fasting). You may have this done every 1-3 years.  Bone density scan. This is done to screen for osteoporosis. You may have this done starting at age 54.  Mammogram. This may be done every 1-2 years. Talk to your health care provider about how often you should have regular mammograms. Talk with your health care provider about your test results, treatment options, and if necessary, the need for more tests. Vaccines  Your health care provider may recommend certain  vaccines, such as:  Influenza vaccine. This is recommended every year.  Tetanus, diphtheria, and acellular pertussis (Tdap, Td) vaccine. You may need a Td booster every 10 years.  Zoster vaccine. You may need this after age 31.  Pneumococcal 13-valent conjugate (PCV13) vaccine. One dose is recommended  after age 42.  Pneumococcal polysaccharide (PPSV23) vaccine. One dose is recommended after age 57. Talk to your health care provider about which screenings and vaccines you need and how often you need them. This information is not intended to replace advice given to you by your health care provider. Make sure you discuss any questions you have with your health care provider. Document Released: 08/28/2015 Document Revised: 04/20/2016 Document Reviewed: 06/02/2015 Elsevier Interactive Patient Education  2017 [place] Prevention in the Home Falls can cause injuries. They can happen to people of all ages. There are many things you can do to make your home safe and to help prevent falls. What can I do on the outside of my home?  Regularly fix the edges of walkways and driveways and fix any cracks.  Remove anything that might make you trip as you walk through a door, such as a raised step or threshold.  Trim any bushes or trees on the path to your home.  Use bright outdoor lighting.  Clear any walking paths of anything that might make someone trip, such as rocks or tools.  Regularly check to see if handrails are loose or broken. Make sure that both sides of any steps have handrails.  Any raised decks and porches should have guardrails on the edges.  Have any leaves, snow, or ice cleared regularly.  Use sand or salt on walking paths during winter.  Clean up any spills in your garage right away. This includes oil or grease spills. What can I do in the bathroom?  Use night lights.  Install grab bars by the toilet and in the tub and shower. Do not use towel bars as grab bars.  Use non-skid mats or decals in the tub or shower.  If you need to sit down in the shower, use a plastic, non-slip stool.  Keep the floor dry. Clean up any water that spills on the floor as soon as it happens.  Remove soap buildup in the tub or shower regularly.  Attach bath mats securely with  double-sided non-slip rug tape.  Do not have throw rugs and other things on the floor that can make you trip. What can I do in the bedroom?  Use night lights.  Make sure that you have a light by your bed that is easy to reach.  Do not use any sheets or blankets that are too big for your bed. They should not hang down onto the floor.  Have a firm chair that has side arms. You can use this for support while you get dressed.  Do not have throw rugs and other things on the floor that can make you trip. What can I do in the kitchen?  Clean up any spills right away.  Avoid walking on wet floors.  Keep items that you use a lot in easy-to-reach places.  If you need to reach something above you, use a strong step stool that has a grab bar.  Keep electrical cords out of the way.  Do not use floor polish or wax that makes floors slippery. If you must use wax, use non-skid floor wax.  Do not have throw rugs and other things  on the floor that can make you trip. What can I do with my stairs?  Do not leave any items on the stairs.  Make sure that there are handrails on both sides of the stairs and use them. Fix handrails that are broken or loose. Make sure that handrails are as long as the stairways.  Check any carpeting to make sure that it is firmly attached to the stairs. Fix any carpet that is loose or worn.  Avoid having throw rugs at the top or bottom of the stairs. If you do have throw rugs, attach them to the floor with carpet tape.  Make sure that you have a light switch at the top of the stairs and the bottom of the stairs. If you do not have them, ask someone to add them for you. What else can I do to help prevent falls?  Wear shoes that:  Do not have high heels.  Have rubber bottoms.  Are comfortable and fit you well.  Are closed at the toe. Do not wear sandals.  If you use a stepladder:  Make sure that it is fully opened. Do not climb a closed stepladder.  Make  sure that both sides of the stepladder are locked into place.  Ask someone to hold it for you, if possible.  Clearly mark and make sure that you can see:  Any grab bars or handrails.  First and last steps.  Where the edge of each step is.  Use tools that help you move around (mobility aids) if they are needed. These include:  Canes.  Walkers.  Scooters.  Crutches.  Turn on the lights when you go into a dark area. Replace any light bulbs as soon as they burn out.  Set up your furniture so you have a clear path. Avoid moving your furniture around.  If any of your floors are uneven, fix them.  If there are any pets around you, be aware of where they are.  Review your medicines with your doctor. Some medicines can make you feel dizzy. This can increase your chance of falling. Ask your doctor what other things that you can do to help prevent falls. This information is not intended to replace advice given to you by your health care provider. Make sure you discuss any questions you have with your health care provider. Document Released: 05/28/2009 Document Revised: 01/07/2016 Document Reviewed: 09/05/2014 Elsevier Interactive Patient Education  2017 [employer].

## 2017-12-27 NOTE — Progress Notes (Addendum)
Subjective:   OVIE EASTEP is a 84 y.o. female who presents for Medicare Annual (Subsequent) preventive examination.  Last AWV-10/05/2016       Objective:     Vitals: BP (!) 168/80 (BP Location: Left Arm, Patient Position: Sitting)   Pulse 63   Temp 97.8 F (36.6 C) (Oral)   Ht  (1.6 m)   Wt 116 lb (52.6 kg)   SpO2 93%   BMI 20.55 kg/m   Body mass index is 20.55 kg/m.  Patient just took BP medication  Advanced Directives 12/27/2017 11/20/2017 08/31/2017 06/20/2017 06/15/2017 02/10/2017 12/07/2016  Does Patient Have a Medical Advance Directive? Yes Yes Yes Yes No Yes -  Type of Estate agent of [place];Living will;Out of facility DNR (pink MOST or yellow form) Living will;Out of facility DNR (pink MOST or yellow form);Healthcare Power of Attorney Living will;Out of facility DNR (pink MOST or yellow form) Living will - Living will Living will  Does patient want to make changes to medical advance directive? No - Patient declined No - Patient declined No - Patient declined No - Patient declined - - -  Copy of Healthcare Power of Attorney in Chart? Yes No - copy requested - - - - -  Would patient like information on creating a medical advance directive? - - - - No - Patient declined - -  Pre-existing out of facility DNR order (yellow form or pink MOST form) Yellow form placed in chart (order not valid for inpatient use) Yellow form placed in chart (order not valid for inpatient use) Yellow form placed in chart (order not valid for inpatient use) - - - -    Tobacco Social History   Tobacco Use  Smoking Status Never Smoker  Smokeless Tobacco Never Used     Counseling given: Not Answered   Clinical Intake:  Pre-visit preparation completed: No  Pain : No/denies pain     Nutritional Risks: None Diabetes: No  How often do you need to have someone help you when you read instructions, pamphlets, or other written materials from your doctor or pharmacy?: 1  - Never  Interpreter Needed?: No  Information entered by :: Tyron Russell, RN  Past Medical History:  Diagnosis Date  . Allergic rhinitis due to pollen   . Anxiety state, unspecified   . Calculus of kidney   . Cataract   . Diaphragmatic hernia without mention of obstruction or gangrene   . Diffuse cystic mastopathy   . Dizziness and giddiness   . GERD (gastroesophageal reflux disease)   . Hiatal hernia   . Insomnia, unspecified   . Insomnia, unspecified   . Lumbago   . Macular degeneration (senile) of retina, unspecified   . Open wound of toe(s), without mention of complication   . Osteoarthrosis, unspecified whether generalized or localized, unspecified site   . Other and unspecified hyperlipidemia   . Personal history of fall   . Sciatica   . Sebaceous cyst   . Senile osteoporosis   . Unspecified constipation   . Unspecified essential hypertension   . Unspecified hereditary and idiopathic peripheral neuropathy   . Unspecified hypothyroidism   . Unspecified tinnitus    Past Surgical History:  Procedure Laterality Date  . CATARACT EXTRACTION, BILATERAL    . CHOLECYSTECTOMY  07/25/2007  . TONSILLECTOMY AND ADENOIDECTOMY Bilateral 1943   Family History  Problem Relation Age of Onset  . Hypertension Brother   . Fibromyalgia Daughter   . Hypertension Son   .  Hypertension Brother   . Hyperlipidemia Brother   . Breast cancer Neg Hx    Social History   Socioeconomic History  . Marital status: Married    Spouse name: Not on file  . Number of children: 2  . Years of education: Not on file  . Highest education level: Not on file  Occupational History  . Occupation: retired  Engineer, production  . Financial resource strain: Not hard at all  . Food insecurity:    Worry: Never true    Inability: Never true  . Transportation needs:    Medical: No    Non-medical: No  Tobacco Use  . Smoking status: Never Smoker  . Smokeless tobacco: Never Used  Substance and Sexual  Activity  . Alcohol use: No    Alcohol/week: 0.0 oz  . Drug use: No  . Sexual activity: Never  Lifestyle  . Physical activity:    Days per week: 7 days    Minutes per session: 30 min  . Stress: Only a little  Relationships  . Social connections:    Talks on phone: More than three times a week    Gets together: More than three times a week    Attends religious service: Never    Active member of club or organization: No    Attends meetings of clubs or organizations: Never    Relationship status: Married  Other Topics Concern  . Not on file  Social History Narrative  . Not on file    Outpatient Encounter Medications as of 12/27/2017  Medication Sig  . acetaminophen (TYLENOL) 500 MG tablet Take 500 mg by mouth every 4 (four) hours as needed.  . Alpha-D-Galactosidase (BEANO) TABS Take by mouth. Take daily before meals  . ALPRAZolam (XANAX) 0.5 MG tablet TAKE 1 TABLET 3 TIMES A DAY AS NEEDED FOR ANXIETY  . Cyanocobalamin (VITAMIN B-12 PO) Take 1 tablet by mouth daily. Vitamin B-12  . doxazosin (CARDURA) 4 MG tablet Take 1 tablet (4 mg total) by mouth daily. One daily to control BP  . indapamide (LOZOL) 1.25 MG tablet TAKE 1 TABLET BY MOUTH EVERY DAY TO HELP CONTROL BLOOD PRESSURE  . KLOR-CON 10 10 MEQ tablet TAKE 1 TABLET BY MOUTH EVERY DAY  . levothyroxine (SYNTHROID, LEVOTHROID) 100 MCG tablet Take 1 tablet (100 mcg total) by mouth daily before breakfast.  . loratadine (CLARITIN) 10 MG tablet Take 10 mg by mouth daily.   Marland Kitchen losartan (COZAAR) 100 MG tablet TAKE 1 TABLET BY MOUTH EVERY DAY TO CONTROL BLOOD PRESSURE  . metoprolol succinate (TOPROL-XL) 100 MG 24 hr tablet TAKE 1 TABLET BY MOUTH EVERY DAY WITH A MEAL TO CONTROL BLOOD PRESSURE  . Multiple Vitamins-Minerals (CENTRUM SILVER) tablet Take 1 tablet by mouth daily.  . pantoprazole (PROTONIX) 40 MG tablet Take 1 tablet (40 mg total) by mouth daily.  . polyethylene glycol (MIRALAX / GLYCOLAX) packet Take 17 g by mouth daily.   Marland Kitchen  Propylene Glycol (SYSTANE BALANCE) 0.6 % SOLN Place 1 drop into both eyes 2 (two) times daily.  . sertraline (ZOLOFT) 50 MG tablet TAKE 1 TABLET BY MOUTH EVERY DAY TO CALM NERVES  . temazepam (RESTORIL) 30 MG capsule Take 1 capsule (30 mg total) by mouth at bedtime as needed. for sleep  . Zoster Vaccine Adjuvanted [facility]) injection Inject 0.5 mLs into the muscle once for 1 dose.  . [DISCONTINUED] Zoster Vaccine Adjuvanted [facility]) injection Inject 0.5 mLs into the muscle once.   No facility-administered encounter medications  on file as of 12/27/2017.     Activities of Daily Living In your present state of health, do you have any difficulty performing the following activities: 12/27/2017  Hearing? N  Vision? Y  Comment macular degeneration  Difficulty concentrating or making decisions? N  Walking or climbing stairs? N  Dressing or bathing? N  Doing errands, shopping? N  Preparing Food and eating ? N  Using the Toilet? N  In the past six months, have you accidently leaked urine? N  Do you have problems with loss of bowel control? Y  Managing your Medications? N  Managing your Finances? N  Housekeeping or managing your Housekeeping? N  Some recent data might be hidden    Patient Care Team: Kermit Balo, DO as PCP - General (Geriatric Medicine) Luciana Axe Alford Highland, MD as Consulting Physician (Ophthalmology) Donzetta Starch, MD as Consulting Physician (Dermatology) Hart Carwin, MD (Inactive) as Consulting Physician (Gastroenterology)    Assessment:   This is a routine wellness examination for Reedy.  Exercise Activities and Dietary recommendations Current Exercise Habits: Home exercise routine, Type of exercise: exercise ball;walking;strength training/weights, Time (Minutes): 30, Frequency (Times/Week): 7, Weekly Exercise (Minutes/Week): 210, Exercise limited by: None identified  Goals    None      Fall Risk Fall Risk  12/27/2017 11/20/2017 11/06/2017 10/11/2017 09/08/2017  Falls  in the past year? Yes Yes No Yes Yes  Number falls in past yr: 2 or more 1 - 1 1  Comment - - - - -  Injury with Fall? No Yes - Yes Yes  Comment - - - - -  Risk Factor Category  - - - - -  Risk for fall due to : - - - - -  Follow up - - - - -   Is the patient's home free of loose throw rugs in walkways, pet beds, electrical cords, etc?   yes      Grab bars in the bathroom? yes      Handrails on the stairs?   yes      Adequate lighting?   yes  Timed Get Up and Go performed: 28 seconds, fall risk  Depression Screen PHQ 2/9 Scores 12/27/2017 08/31/2017 02/10/2017 10/05/2016  PHQ - 2 Score 0 0 0 0     Cognitive Function MMSE - Mini Mental State Exam 12/27/2017 10/05/2016 10/28/2014  Not completed: (No Data) - -  Orientation to time Orientation to Place Registration Attention/ Calculation Recall Language- name 2 objects Language- repeat Language- follow 3 step command Language- read & follow direction Write a sentence Copy design 0 0 1  Total score Immunization History  Administered Date(s) Administered  . Influenza, High Dose Seasonal PF 05/04/2017  . Influenza,inj,Quad PF,6+ Mos 05/27/2013, 04/30/2014, 05/18/2015, 05/16/2016  . Influenza-Unspecified 05/04/2011  . PPD Test 06/20/2017  . Pneumococcal Conjugate-13 08/11/2015  . Pneumococcal-Unspecified 08/16/1999  . Td 12/07/2005  . Tdap 06/15/2017  . Zoster 08/14/2015    Qualifies for Shingles Vaccine? Yes, educated and prescription sent to pharmacy  Screening Tests Health Maintenance  Topic Date Due  . INFLUENZA VACCINE  03/15/2018  . TETANUS/TDAP  06/16/2027  . DEXA SCAN  Completed  . PNA vac Low Risk  Adult  Completed    Cancer Screenings: Lung: Low Dose CT Chest recommended if Age 71-80 years, 30 pack-year currently smoking OR have quit w/in 15years. Patient does not qualify. Breast:  Up to date on Mammogram? Yes   Up to  date of Bone Density/Dexa? Yes Colorectal: up to date  Additional Screenings:  Hepatitis C Screening: declined     Plan:    I have personally reviewed and addressed the Medicare Annual Wellness questionnaire and have noted the following in the patient's chart:  A. Medical and social history B. Use of alcohol, tobacco or illicit drugs  C. Current medications and supplements D. Functional ability and status E.  Nutritional status F.  Physical activity G. Advance directives H. List of other physicians I.  Hospitalizations, surgeries, and ER visits in previous 12 months J.  Vitals K. Screenings to include hearing, vision, cognitive, depression L. Referrals and appointments - none  In addition, I have reviewed and discussed with patient certain preventive protocols, quality metrics, and best practice recommendations. A written personalized care plan for preventive services as well as general preventive health recommendations were provided to patient.  See attached scanned questionnaire for additional information.   Signed,   Tyron Russell, RN Nurse Health Advisor  Patient Concerns: She has been losing weight

## 2018-01-01 ENCOUNTER — Ambulatory Visit: Payer: Medicare Other | Admitting: Internal Medicine

## 2018-01-03 ENCOUNTER — Other Ambulatory Visit: Payer: Self-pay | Admitting: Internal Medicine

## 2018-01-03 DIAGNOSIS — I10 Essential (primary) hypertension: Secondary | ICD-10-CM

## 2018-01-31 ENCOUNTER — Other Ambulatory Visit: Payer: Self-pay | Admitting: Internal Medicine

## 2018-01-31 DIAGNOSIS — F411 Generalized anxiety disorder: Secondary | ICD-10-CM

## 2018-01-31 NOTE — Telephone Encounter (Signed)
01/31/2018 Database confirmed. Patient dropped bottle and lost some under bed, now gone by vacuum. Patient has 1 remaining and wants to be able to pick up new Rx tomorrow.   Rx called in.

## 2018-02-05 DIAGNOSIS — L089 Local infection of the skin and subcutaneous tissue, unspecified: Secondary | ICD-10-CM | POA: Diagnosis not present

## 2018-02-05 DIAGNOSIS — G629 Polyneuropathy, unspecified: Secondary | ICD-10-CM | POA: Diagnosis not present

## 2018-02-06 ENCOUNTER — Ambulatory Visit (INDEPENDENT_AMBULATORY_CARE_PROVIDER_SITE_OTHER): Payer: Medicare Other | Admitting: Internal Medicine

## 2018-02-06 ENCOUNTER — Encounter: Payer: Self-pay | Admitting: Internal Medicine

## 2018-02-06 VITALS — BP 128/80 | HR 60 | Temp 98.4°F | Resp 10 | Ht 63.0 in | Wt 115.8 lb

## 2018-02-06 DIAGNOSIS — L03031 Cellulitis of right toe: Secondary | ICD-10-CM

## 2018-02-06 DIAGNOSIS — S91101A Unspecified open wound of right great toe without damage to nail, initial encounter: Secondary | ICD-10-CM | POA: Diagnosis not present

## 2018-02-06 DIAGNOSIS — H3530 Unspecified macular degeneration: Secondary | ICD-10-CM

## 2018-02-06 NOTE — Patient Instructions (Signed)
CLEAN WOUND DAILY USING WARM SOAPY ANTIBACTERIAL DIAL SOAP WATER AND APPLY TOPICAL ANTIBIOTIC. USE GUAZE TO DRESS WOUND DAILY X 5 DAYS  KEEP FOOT ELEVATED TO REDUCE PAIN AND SWELLING  FINISH DOXYCYCLINE  Follow up as scheduled with Shanda Bumps or sooner if need be   Wound Care, Adult Taking care of your wound properly can help to prevent pain and infection. It can also help your wound to heal more quickly. How is this treated? Wound care  Follow instructions from your health care provider about how to take care of your wound. Make sure you: ? Wash your hands with soap and water before you change the bandage (dressing). If soap and water are not available, use hand sanitizer. ? Change your dressing as told by your health care provider. ? Leave stitches (sutures), skin glue, or adhesive strips in place. These skin closures may need to stay in place for 2 weeks or longer. If adhesive strip edges start to loosen and curl up, you may trim the loose edges. Do not remove adhesive strips completely unless your health care provider tells you to do that.  Check your wound area every day for signs of infection. Check for: ? More redness, swelling, or pain. ? More fluid or blood. ? Warmth. ? Pus or a bad smell.  Ask your health care provider if you should clean the wound with mild soap and water. Doing this may include: ? Using a clean towel to pat the wound dry after cleaning it. Do not rub or scrub the wound. ? Applying a cream or ointment. Do this only as told by your health care provider. ? Covering the incision with a clean dressing.  Ask your health care provider when you can leave the wound uncovered. Medicines   If you were prescribed an antibiotic medicine, cream, or ointment, take or use the antibiotic as told by your health care provider. Do not stop taking or using the antibiotic even if your condition improves.  Take over-the-counter and prescription medicines only as told by your  health care provider. If you were prescribed pain medicine, take it at least 30 minutes before doing any wound care or as told by your health care provider. General instructions  Return to your normal activities as told by your health care provider. Ask your health care provider what activities are safe.  Do not scratch or pick at the wound.  Keep all follow-up visits as told by your health care provider. This is important.  Eat a diet that includes protein, vitamin A, vitamin C, and other nutrient-rich foods. These help the wound heal: ? Protein-rich foods include meat, dairy, beans, nuts, and other sources. ? Vitamin A-rich foods include carrots and dark green, leafy vegetables. ? Vitamin C-rich foods include citrus, tomatoes, and other fruits and vegetables. ? Nutrient-rich foods have protein, carbohydrates, fat, vitamins, or minerals. Eat a variety of healthy foods including vegetables, fruits, and whole grains. Contact a health care provider if:  You received a tetanus shot and you have swelling, severe pain, redness, or bleeding at the injection site.  Your pain is not controlled with medicine.  You have more redness, swelling, or pain around the wound.  You have more fluid or blood coming from the wound.  Your wound feels warm to the touch.  You have pus or a bad smell coming from the wound.  You have a fever or chills.  You are nauseous or you vomit.  You are dizzy. Get help right  away if:  You have a red streak going away from your wound.  The edges of the wound open up and separate.  Your wound is bleeding and the bleeding does not stop with gentle pressure.  You have a rash.  You faint.  You have trouble breathing. This information is not intended to replace advice given to you by your health care provider. Make sure you discuss any questions you have with your health care provider. Document Released: 05/10/2008 Document Revised: 03/30/2016 Document Reviewed:  02/16/2016 Elsevier Interactive Patient Education  2017 [employer]

## 2018-02-06 NOTE — Progress Notes (Signed)
Patient ID: [name], female   DOB: 08-19-1930, 84 y.o.   MRN: [id number]   [facility] OFFICE  Provider: DR Elmon Kirschner  Code Status:  Goals of Care:  Advanced Directives 12/27/2017  Does Patient Have a Medical Advance Directive? Yes  Type of Estate agent of [place];Living will;Out of facility DNR (pink MOST or yellow form)  Does patient want to make changes to medical advance directive? No - Patient declined  Copy of Healthcare Power of Attorney in Chart? Yes  Would patient like information on creating a medical advance directive? -  Pre-existing out of facility DNR order (yellow form or pink MOST form) Yellow form placed in chart (order not valid for inpatient use)     Chief Complaint  Patient presents with  . Acute Visit    Examine right big toe - possible infection- patient is using compound cream rx'ed by foot specialist and taking doxy rx'ed by [facility] doctor. Patient denies pain in toe. Patient went to nail salon and had toe nails trimmed and filed. Patient with discolored drainage, the nail tech squeezed all the drainage out until blood present, and poured alochol on area.     HPI: Patient is a 84 y.o. female seen today for an acute visit for right 1st toe infection. She noticed it yesterday while getting her nails trimmed at nail salon. She went to [facility] and was Rx Doxycycline BID x 10 days. She was told to change dressing BID and keep area cleaned with dial antibacterial soap. She plans to see podiatry next month. She needs referral to [facility] to have them come out to dress her toe.  She has macular degeneration and is unable to see to clean the area herself.    Past Medical History:  Diagnosis Date  . Allergic rhinitis due to pollen   . Anxiety state, unspecified   . Calculus of kidney   . Cataract   . Diaphragmatic hernia without mention of obstruction or gangrene   . Diffuse cystic mastopathy   . Dizziness and giddiness   .  GERD (gastroesophageal reflux disease)   . Hiatal hernia   . Insomnia, unspecified   . Insomnia, unspecified   . Lumbago   . Macular degeneration (senile) of retina, unspecified   . Open wound of toe(s), without mention of complication   . Osteoarthrosis, unspecified whether generalized or localized, unspecified site   . Other and unspecified hyperlipidemia   . Personal history of fall   . Sciatica   . Sebaceous cyst   . Senile osteoporosis   . Unspecified constipation   . Unspecified essential hypertension   . Unspecified hereditary and idiopathic peripheral neuropathy   . Unspecified hypothyroidism   . Unspecified tinnitus     Past Surgical History:  Procedure Laterality Date  . CATARACT EXTRACTION, BILATERAL    . CHOLECYSTECTOMY  07/25/2007  . TONSILLECTOMY AND ADENOIDECTOMY Bilateral 1943     reports that she has never smoked. She has never used smokeless tobacco. She reports that she does not drink alcohol or use drugs. Social History   Socioeconomic History  . Marital status: Married    Spouse name: Not on file  . Number of children: 2  . Years of education: Not on file  . Highest education level: Not on file  Occupational History  . Occupation: retired  Engineer, production  . Financial resource strain: Not hard at all  . Food insecurity:    Worry: Never true  Inability: Never true  . Transportation needs:    Medical: No    Non-medical: No  Tobacco Use  . Smoking status: Never Smoker  . Smokeless tobacco: Never Used  Substance and Sexual Activity  . Alcohol use: No    Alcohol/week: 0.0 oz  . Drug use: No  . Sexual activity: Never  Lifestyle  . Physical activity:    Days per week: 7 days    Minutes per session: 30 min  . Stress: Only a little  Relationships  . Social connections:    Talks on phone: More than three times a week    Gets together: More than three times a week    Attends religious service: Never    Active member of club or organization: No      Attends meetings of clubs or organizations: Never    Relationship status: Married  . Intimate partner violence:    Fear of current or ex partner: No    Emotionally abused: No    Physically abused: No    Forced sexual activity: No  Other Topics Concern  . Not on file  Social History Narrative  . Not on file    Family History  Problem Relation Age of Onset  . Hypertension Brother   . Fibromyalgia Daughter   . Hypertension Son   . Hypertension Brother   . Hyperlipidemia Brother   . Breast cancer Neg Hx     Allergies  Allergen Reactions  . Ambien [Zolpidem Tartrate]     hallucination  . Bactrim [Sulfamethoxazole-Trimethoprim]     Unknown reaction   . Morphine And Related Nausea And Vomiting  . Zithromax [Azithromycin]     Not effective      Outpatient Encounter Medications as of 02/06/2018  Medication Sig  . acetaminophen (TYLENOL) 500 MG tablet Take 500 mg by mouth every 4 (four) hours as needed.  . Alpha-D-Galactosidase (BEANO) TABS Take by mouth. Take daily before meals  . ALPRAZolam (XANAX) 0.5 MG tablet TAKE 1 TABLET 3 TIMES A DAY AS NEEDED FOR ANXIETY  . Cyanocobalamin (VITAMIN B-12 PO) Take 1 tablet by mouth daily. Vitamin B-12  . doxazosin (CARDURA) 4 MG tablet Take 1 tablet (4 mg total) by mouth daily. One daily to control BP  . doxycycline (ADOXA) 100 MG tablet 1 by mouth 2 times daily for foot infection  . indapamide (LOZOL) 1.25 MG tablet TAKE 1 TABLET BY MOUTH EVERY DAY TO HELP CONTROL BLOOD PRESSURE  . KLOR-CON 10 10 MEQ tablet TAKE 1 TABLET BY MOUTH EVERY DAY  . levothyroxine (SYNTHROID, LEVOTHROID) 100 MCG tablet Take 1 tablet (100 mcg total) by mouth daily before breakfast.  . loratadine (CLARITIN) 10 MG tablet Take 10 mg by mouth daily.   Marland Kitchen. losartan (COZAAR) 100 MG tablet TAKE 1 TABLET BY MOUTH EVERY DAY TO CONTROL BLOOD PRESSURE  . metoprolol succinate (TOPROL-XL) 100 MG 24 hr tablet TAKE 1 TABLET BY MOUTH EVERY DAY WITH A MEAL TO CONTROL BLOOD  PRESSURE  . Multiple Vitamins-Minerals (CENTRUM SILVER) tablet Take 1 tablet by mouth daily.  . pantoprazole (PROTONIX) 40 MG tablet Take 1 tablet (40 mg total) by mouth daily.  . polyethylene glycol (MIRALAX / GLYCOLAX) packet Take 17 g by mouth daily.   Marland Kitchen. Propylene Glycol (SYSTANE BALANCE) 0.6 % SOLN Place 1 drop into both eyes 2 (two) times daily.  . sertraline (ZOLOFT) 50 MG tablet TAKE 1 TABLET BY MOUTH EVERY DAY TO CALM NERVES  . temazepam (RESTORIL) 30 MG capsule  Take 1 capsule (30 mg total) by mouth at bedtime as needed. for sleep  . UNABLE TO FIND APPLY 1-2 GMS TO AFFECTED AREA 3-4 TIMES DAILY AS NEEDED FOR PAIN. THIS IS A COMPOUNDED CREAM.1 PUMP EQUALS 1 GRAM.   No facility-administered encounter medications on file as of 02/06/2018.     Review of Systems:  Review of Systems  Eyes: Positive for visual disturbance.  Musculoskeletal: Positive for arthralgias.  Skin: Positive for wound.  All other systems reviewed and are negative.   Health Maintenance  Topic Date Due  . INFLUENZA VACCINE  03/15/2018  . TETANUS/TDAP  06/16/2027  . DEXA SCAN  Completed  . PNA vac Low Risk Adult  Completed    Physical Exam: Vitals:   02/06/18 1353  BP: 128/80  Pulse: 60  Resp: 10  Temp: 98.4 F (36.9 C)  TempSrc: Oral  SpO2: 93%  Weight: 115 lb 12.8 oz (52.5 kg)  Height: 5\' 3"  (1.6 m)   Body mass index is 20.51 kg/m. Physical Exam  Constitutional: She is oriented to person, place, and time. She appears well-developed and well-nourished.  Musculoskeletal: She exhibits edema and deformity (right bunion).  Neurological: She is alert and oriented to person, place, and time.  Skin: Skin is warm and dry.  Right 1st toenail ingrown - no d/c; min swelling but no d/c. NT; dried blood noted; toenail thick and dystrophic appearing  Psychiatric: She has a normal mood and affect. Her behavior is normal. Judgment and thought content normal.    Labs reviewed: Basic Metabolic Panel: Recent  Labs    02/10/17 1219 08/29/17 1009 12/27/17 1026  NA 131* 136 132*  K 4.3 3.6 3.5  CL 88* 92* 88*  CO2 32* 37* 37*  GLUCOSE 79 100* 109*  BUN 13 16 16   CREATININE 0.76 0.84 0.72  CALCIUM 9.3 9.8 9.9  TSH 3.59 5.63* 5.59*   Liver Function Tests: Recent Labs    08/29/17 1009  AST 14  ALT 9  BILITOT 0.6  PROT 6.3   No results for input(s): LIPASE, AMYLASE in the last 8760 hours. No results for input(s): AMMONIA in the last 8760 hours. CBC: Recent Labs    02/10/17 1219 08/29/17 1009  WBC 4.6 3.9  NEUTROABS 3,266 2,687  HGB 13.2 12.9  HCT 40.0 39.5  MCV 87.0 85.7  PLT 151 150   Lipid Panel: Recent Labs    08/29/17 1009  CHOL 198  HDL 61  LDLCALC 115*  TRIG 113  CHOLHDL 3.2   Lab Results  Component Value Date   HGBA1C  07/23/2007    5.7 (NOTE)   The ADA recommends the following therapeutic goals for glycemic   control related to Hgb A1C measurement:   Goal of Therapy:   < 7.0% Hgb A1C   Action Suggested:  > 8.0% Hgb A1C   Ref:  Diabetes Care, 22, Suppl. 1, 1999    Procedures since last visit: No results found.  Assessment/Plan   ICD-10-CM   1. Open wound of right great toe, initial encounter S91.101A Ambulatory referral to Home Health  2. Cellulitis of right toe L03.031 Ambulatory referral to Home Health  3. Macular degeneration (senile) of retina H35.30    CLEAN WOUND DAILY USING WARM SOAPY ANTIBACTERIAL DIAL SOAP WATER AND APPLY TOPICAL ANTIBIOTIC. USE GUAZE TO DRESS WOUND DAILY X 5 DAYS  KEEP FOOT ELEVATED TO REDUCE PAIN AND SWELLING  FINISH DOXYCYCLINE  Follow up as scheduled with Shanda Bumps or sooner if need be  Markavious Micco S. Perlie Gold  [facility] [address] [place], [place] [address] [phone] Cell (Monday-Friday 8 AM - 5 PM) [phone] After 5 PM and follow prompts

## 2018-02-08 ENCOUNTER — Telehealth: Payer: Self-pay

## 2018-02-08 ENCOUNTER — Encounter: Payer: Self-pay | Admitting: Internal Medicine

## 2018-02-08 DIAGNOSIS — I10 Essential (primary) hypertension: Secondary | ICD-10-CM | POA: Diagnosis not present

## 2018-02-08 DIAGNOSIS — M1991 Primary osteoarthritis, unspecified site: Secondary | ICD-10-CM | POA: Diagnosis not present

## 2018-02-08 DIAGNOSIS — M810 Age-related osteoporosis without current pathological fracture: Secondary | ICD-10-CM | POA: Diagnosis not present

## 2018-02-08 DIAGNOSIS — S91201D Unspecified open wound of right great toe with damage to nail, subsequent encounter: Secondary | ICD-10-CM | POA: Diagnosis not present

## 2018-02-08 DIAGNOSIS — G609 Hereditary and idiopathic neuropathy, unspecified: Secondary | ICD-10-CM | POA: Diagnosis not present

## 2018-02-08 DIAGNOSIS — M47816 Spondylosis without myelopathy or radiculopathy, lumbar region: Secondary | ICD-10-CM | POA: Diagnosis not present

## 2018-02-08 DIAGNOSIS — L03031 Cellulitis of right toe: Secondary | ICD-10-CM | POA: Diagnosis not present

## 2018-02-08 NOTE — Telephone Encounter (Signed)
Genevieve with [facility] called to inform Dr.Carter that they will be able to provide wound care 3 days weekly M/W/F (unfortanately wound care services are not available daily)  3 x weekly x 4 weeks  1 x weekly x 1 week  Per [facility] standing order, verbal order given. Message will be sent to patient's provider as a FYI.

## 2018-02-12 ENCOUNTER — Telehealth: Payer: Self-pay | Admitting: *Deleted

## 2018-02-12 ENCOUNTER — Other Ambulatory Visit: Payer: Self-pay | Admitting: Internal Medicine

## 2018-02-12 DIAGNOSIS — I10 Essential (primary) hypertension: Secondary | ICD-10-CM | POA: Diagnosis not present

## 2018-02-12 DIAGNOSIS — L03031 Cellulitis of right toe: Secondary | ICD-10-CM | POA: Diagnosis not present

## 2018-02-12 DIAGNOSIS — G609 Hereditary and idiopathic neuropathy, unspecified: Secondary | ICD-10-CM | POA: Diagnosis not present

## 2018-02-12 DIAGNOSIS — M810 Age-related osteoporosis without current pathological fracture: Secondary | ICD-10-CM | POA: Diagnosis not present

## 2018-02-12 DIAGNOSIS — S91201D Unspecified open wound of right great toe with damage to nail, subsequent encounter: Secondary | ICD-10-CM | POA: Diagnosis not present

## 2018-02-12 DIAGNOSIS — M47816 Spondylosis without myelopathy or radiculopathy, lumbar region: Secondary | ICD-10-CM | POA: Diagnosis not present

## 2018-02-12 NOTE — Telephone Encounter (Signed)
[name] from [facility] called to report that patient said she fell yesterday. She denies any injuries or pain at this time and none noted on exam. She wanted you to be aware.

## 2018-02-12 NOTE — Telephone Encounter (Signed)
Noted.  Glad she is ok.

## 2018-02-14 ENCOUNTER — Other Ambulatory Visit: Payer: Self-pay | Admitting: Internal Medicine

## 2018-02-14 DIAGNOSIS — M47816 Spondylosis without myelopathy or radiculopathy, lumbar region: Secondary | ICD-10-CM | POA: Diagnosis not present

## 2018-02-14 DIAGNOSIS — S91201D Unspecified open wound of right great toe with damage to nail, subsequent encounter: Secondary | ICD-10-CM | POA: Diagnosis not present

## 2018-02-14 DIAGNOSIS — L03031 Cellulitis of right toe: Secondary | ICD-10-CM | POA: Diagnosis not present

## 2018-02-14 DIAGNOSIS — G4700 Insomnia, unspecified: Secondary | ICD-10-CM

## 2018-02-14 DIAGNOSIS — G609 Hereditary and idiopathic neuropathy, unspecified: Secondary | ICD-10-CM | POA: Diagnosis not present

## 2018-02-14 DIAGNOSIS — M810 Age-related osteoporosis without current pathological fracture: Secondary | ICD-10-CM | POA: Diagnosis not present

## 2018-02-14 DIAGNOSIS — I10 Essential (primary) hypertension: Secondary | ICD-10-CM | POA: Diagnosis not present

## 2018-02-14 NOTE — Telephone Encounter (Signed)
A medication refill was received from pharmacy for temazepam 30 mg. Rx was called in to pharmacy after verifying last fill date, provider, and quantity on PMP [place] database.   

## 2018-02-16 ENCOUNTER — Ambulatory Visit: Payer: Medicare Other | Admitting: Nurse Practitioner

## 2018-02-16 DIAGNOSIS — L03031 Cellulitis of right toe: Secondary | ICD-10-CM | POA: Diagnosis not present

## 2018-02-16 DIAGNOSIS — M810 Age-related osteoporosis without current pathological fracture: Secondary | ICD-10-CM | POA: Diagnosis not present

## 2018-02-16 DIAGNOSIS — M47816 Spondylosis without myelopathy or radiculopathy, lumbar region: Secondary | ICD-10-CM | POA: Diagnosis not present

## 2018-02-16 DIAGNOSIS — G609 Hereditary and idiopathic neuropathy, unspecified: Secondary | ICD-10-CM | POA: Diagnosis not present

## 2018-02-16 DIAGNOSIS — I10 Essential (primary) hypertension: Secondary | ICD-10-CM | POA: Diagnosis not present

## 2018-02-16 DIAGNOSIS — S91201D Unspecified open wound of right great toe with damage to nail, subsequent encounter: Secondary | ICD-10-CM | POA: Diagnosis not present

## 2018-02-19 ENCOUNTER — Encounter: Payer: Self-pay | Admitting: Internal Medicine

## 2018-02-19 ENCOUNTER — Ambulatory Visit (INDEPENDENT_AMBULATORY_CARE_PROVIDER_SITE_OTHER): Payer: Medicare Other | Admitting: Internal Medicine

## 2018-02-19 VITALS — BP 140/80 | HR 65 | Temp 97.8°F | Ht 63.0 in | Wt 115.0 lb

## 2018-02-19 DIAGNOSIS — L97511 Non-pressure chronic ulcer of other part of right foot limited to breakdown of skin: Secondary | ICD-10-CM | POA: Diagnosis not present

## 2018-02-19 DIAGNOSIS — H3530 Unspecified macular degeneration: Secondary | ICD-10-CM | POA: Diagnosis not present

## 2018-02-19 DIAGNOSIS — L03031 Cellulitis of right toe: Secondary | ICD-10-CM

## 2018-02-19 DIAGNOSIS — R634 Abnormal weight loss: Secondary | ICD-10-CM

## 2018-02-19 DIAGNOSIS — G609 Hereditary and idiopathic neuropathy, unspecified: Secondary | ICD-10-CM | POA: Diagnosis not present

## 2018-02-19 NOTE — Progress Notes (Signed)
Location:  [facility] clinic Provider: Carlester Kasparek L. Renato Gails, D.O., C.M.D.  Code Status: DNR Goals of Care:  Advanced Directives 12/27/2017  Does Patient Have a Medical Advance Directive? Yes  Type of Estate agent of [place];Living will;Out of facility DNR (pink MOST or yellow form)  Does patient want to make changes to medical advance directive? No - Patient declined  Copy of Healthcare Power of Attorney in Chart? Yes  Would patient like information on creating a medical advance directive? -  Pre-existing out of facility DNR order (yellow form or pink MOST form) Yellow form placed in chart (order not valid for inpatient use)   Chief Complaint  Patient presents with  . Acute Visit    infection in right big toe    HPI: Patient is a 84 y.o. female seen today for an acute visit for right big toe ulceration.  Tries not to get cream under her toenails.  That fungal nail had only grown back part way and her spur pushed against the nail. Was using the fungus cream from the podiatrist.  Saw podiatrist in [place] for her neuropathy and toenail fungus, but he does not cut her toenails.   Soaked her feet the night before, nails trimmed w/o a problem, then she fiiled the nail and it ruptured a pustule.  Went to [facility] urgent care.  Got fixed up and prescribed doxy.   Then Dr. Montez Morita saw her here.  Nurse coming three times a week.  Finished her 20 doxycycline tablets.  Using dial antibacterial on her other toes.   Pt cannot see her own toes well.  She never felt anything.  Weight stable at 115 lbs, but has not lost more.  Still has her 1/2 shake or full shake in the afternoon.  May eat a banana with it.    Weak feeling from bp meds no longer.  Regimen ok where it is.    Vision getting worse.  Couldn't see her med bottles for a few minutes the other day.  She moved up her appt with Dr. Luciana Axe.     Past Medical History:  Diagnosis Date  . Allergic rhinitis due to  pollen   . Anxiety state, unspecified   . Calculus of kidney   . Cataract   . Diaphragmatic hernia without mention of obstruction or gangrene   . Diffuse cystic mastopathy   . Dizziness and giddiness   . GERD (gastroesophageal reflux disease)   . Hiatal hernia   . Insomnia, unspecified   . Insomnia, unspecified   . Lumbago   . Macular degeneration (senile) of retina, unspecified   . Open wound of toe(s), without mention of complication   . Osteoarthrosis, unspecified whether generalized or localized, unspecified site   . Other and unspecified hyperlipidemia   . Personal history of fall   . Sciatica   . Sebaceous cyst   . Senile osteoporosis   . Unspecified constipation   . Unspecified essential hypertension   . Unspecified hereditary and idiopathic peripheral neuropathy   . Unspecified hypothyroidism   . Unspecified tinnitus     Past Surgical History:  Procedure Laterality Date  . CATARACT EXTRACTION, BILATERAL    . CHOLECYSTECTOMY  07/25/2007  . TONSILLECTOMY AND ADENOIDECTOMY Bilateral 1943    Allergies  Allergen Reactions  . Ambien [Zolpidem Tartrate]     hallucination  . Bactrim [Sulfamethoxazole-Trimethoprim]     Unknown reaction   . Morphine And Related Nausea And Vomiting  . Zithromax [Azithromycin]  Not effective      Outpatient Encounter Medications as of 02/19/2018  Medication Sig  . acetaminophen (TYLENOL) 500 MG tablet Take 500 mg by mouth every 4 (four) hours as needed.  . Alpha-D-Galactosidase (BEANO) TABS Take by mouth. Take daily before meals  . ALPRAZolam (XANAX) 0.5 MG tablet TAKE 1 TABLET 3 TIMES A DAY AS NEEDED FOR ANXIETY  . Cyanocobalamin (VITAMIN B-12 PO) Take 1 tablet by mouth daily. Vitamin B-12  . doxazosin (CARDURA) 4 MG tablet Take 1 tablet (4 mg total) by mouth daily. One daily to control BP  . indapamide (LOZOL) 1.25 MG tablet TAKE 1 TABLET BY MOUTH EVERY DAY TO HELP CONTROL BLOOD PRESSURE  . KLOR-CON 10 10 MEQ tablet TAKE 1 TABLET  BY MOUTH EVERY DAY  . levothyroxine (SYNTHROID, LEVOTHROID) 100 MCG tablet TAKE 1 TABLET EVERY DAY BEFORE BREAKFAST  . loratadine (CLARITIN) 10 MG tablet Take 10 mg by mouth daily.   Marland Kitchen losartan (COZAAR) 100 MG tablet TAKE 1 TABLET BY MOUTH EVERY DAY TO CONTROL BLOOD PRESSURE  . metoprolol succinate (TOPROL-XL) 100 MG 24 hr tablet TAKE 1 TABLET BY MOUTH EVERY DAY WITH A MEAL TO CONTROL BLOOD PRESSURE  . Multiple Vitamins-Minerals (CENTRUM SILVER) tablet Take 1 tablet by mouth daily.  . pantoprazole (PROTONIX) 40 MG tablet Take 1 tablet (40 mg total) by mouth daily.  . polyethylene glycol (MIRALAX / GLYCOLAX) packet Take 17 g by mouth daily.   Marland Kitchen Propylene Glycol (SYSTANE BALANCE) 0.6 % SOLN Place 1 drop into both eyes 2 (two) times daily.  . sertraline (ZOLOFT) 50 MG tablet TAKE 1 TABLET BY MOUTH EVERY DAY TO CALM NERVES  . temazepam (RESTORIL) 30 MG capsule TAKE ONE CAPSULE AT BEDTIME AS NEEDED FOR SLEEP  . UNABLE TO FIND APPLY 1-2 GMS TO AFFECTED AREA 3-4 TIMES DAILY AS NEEDED FOR PAIN. THIS IS A COMPOUNDED CREAM.1 PUMP EQUALS 1 GRAM.  . [DISCONTINUED] doxycycline (ADOXA) 100 MG tablet 1 by mouth 2 times daily for foot infection   No facility-administered encounter medications on file as of 02/19/2018.     Review of Systems:  Review of Systems  Constitutional: Negative for chills, diaphoresis, fever, malaise/fatigue and weight loss.       Wt stable  HENT: Positive for hearing loss. Negative for congestion.   Eyes: Positive for blurred vision.  Respiratory: Negative for cough and shortness of breath.   Cardiovascular: Negative for chest pain, palpitations and leg swelling.  Gastrointestinal: Negative for abdominal pain, blood in stool, constipation, diarrhea and melena.  Genitourinary: Negative for dysuria.  Musculoskeletal: Positive for falls.  Skin: Negative for itching and rash.  Neurological: Negative for dizziness and headaches.  Endo/Heme/Allergies: Bruises/bleeds easily.    Psychiatric/Behavioral: Negative for memory loss. The patient is nervous/anxious.     Health Maintenance  Topic Date Due  . INFLUENZA VACCINE  03/15/2018  . TETANUS/TDAP  06/16/2027  . DEXA SCAN  Completed  . PNA vac Low Risk Adult  Completed    Physical Exam: Vitals:   02/19/18 1310  BP: 140/80  Pulse: 65  Temp: 97.8 F (36.6 C)  TempSrc: Oral  SpO2: 93%  Weight: 115 lb (52.2 kg)  Height: 5\' 3"  (1.6 m)   Body mass index is 20.37 kg/m. Physical Exam  Constitutional: She is oriented to person, place, and time. She appears well-developed. No distress.  thin  Eyes:  Glasses  Cardiovascular: Normal rate, regular rhythm, normal heart sounds and intact distal pulses.  Pulmonary/Chest: Effort normal and breath  sounds normal. No respiratory distress.  Musculoskeletal: Normal range of motion.  Neurological: She is alert and oriented to person, place, and time.  Skin: Skin is warm and dry. Capillary refill takes less than 2 seconds.  Right great toe with healed area of ulceration laterally where second toe had been rubbing  Psychiatric: She has a normal mood and affect.    Labs reviewed: Basic Metabolic Panel: Recent Labs    08/29/17 1009 12/27/17 1026  NA 136 132*  K 3.6 3.5  CL 92* 88*  CO2 37* 37*  GLUCOSE 100* 109*  BUN 16 16  CREATININE 0.84 0.72  CALCIUM 9.8 9.9  TSH 5.63* 5.59*   Liver Function Tests: Recent Labs    08/29/17 1009  AST 14  ALT 9  BILITOT 0.6  PROT 6.3   No results for input(s): LIPASE, AMYLASE in the last 8760 hours. No results for input(s): AMMONIA in the last 8760 hours. CBC: Recent Labs    08/29/17 1009  WBC 3.9  NEUTROABS 2,687  HGB 12.9  HCT 39.5  MCV 85.7  PLT 150   Lipid Panel: Recent Labs    08/29/17 1009  CHOL 198  HDL 61  LDLCALC 115*  TRIG 113  CHOLHDL 3.2   Lab Results  Component Value Date   HGBA1C  07/23/2007    5.7 (NOTE)   The ADA recommends the following therapeutic goals for glycemic   control  related to Hgb A1C measurement:   Goal of Therapy:   < 7.0% Hgb A1C   Action Suggested:  > 8.0% Hgb A1C   Ref:  Diabetes Care, 22, Suppl. 1, 1999    Assessment/Plan 1. Skin ulcer of right great toe, limited to breakdown of skin (HCC) -dried up, closed -recommended using a sleeve on the second to and separator to keep pressure off the area -no further wound care needed  2. Cellulitis of right toe -resolved with abx  3. Hereditary and idiopathic peripheral neuropathy -contributed to above as pt did not know she has the blister (cannot see with #4)  4. Macular degeneration (senile) of retina -worsening, f/u with Dr. Luciana Axeankin as planned (moved appt up)  5. Weight loss -stabilized now with shakes in afternoon and banana as snack--not thrilled with food where she lives  Labs/tests ordered:  No orders of the defined types were placed in this encounter. keep scheduled labs and appt  Next appt:  03/12/2018  [name] L. Demarrion Meiklejohn, D.O. Geriatrics [facility] [address] [place], [place] [id number] Cell Phone (Mon-Fri 8am-5pm):  [phone] On Call:  [phone] & follow prompts after 5pm & weekends Office Phone:  [phone] Office Fax:  [phone]

## 2018-02-20 DIAGNOSIS — L821 Other seborrheic keratosis: Secondary | ICD-10-CM | POA: Diagnosis not present

## 2018-02-20 DIAGNOSIS — D692 Other nonthrombocytopenic purpura: Secondary | ICD-10-CM | POA: Diagnosis not present

## 2018-02-20 DIAGNOSIS — L438 Other lichen planus: Secondary | ICD-10-CM | POA: Diagnosis not present

## 2018-02-21 DIAGNOSIS — S91201D Unspecified open wound of right great toe with damage to nail, subsequent encounter: Secondary | ICD-10-CM | POA: Diagnosis not present

## 2018-02-21 DIAGNOSIS — I10 Essential (primary) hypertension: Secondary | ICD-10-CM | POA: Diagnosis not present

## 2018-02-21 DIAGNOSIS — M47816 Spondylosis without myelopathy or radiculopathy, lumbar region: Secondary | ICD-10-CM | POA: Diagnosis not present

## 2018-02-21 DIAGNOSIS — M810 Age-related osteoporosis without current pathological fracture: Secondary | ICD-10-CM | POA: Diagnosis not present

## 2018-02-21 DIAGNOSIS — G609 Hereditary and idiopathic neuropathy, unspecified: Secondary | ICD-10-CM | POA: Diagnosis not present

## 2018-02-21 DIAGNOSIS — L03031 Cellulitis of right toe: Secondary | ICD-10-CM | POA: Diagnosis not present

## 2018-02-23 DIAGNOSIS — M810 Age-related osteoporosis without current pathological fracture: Secondary | ICD-10-CM | POA: Diagnosis not present

## 2018-02-23 DIAGNOSIS — S91201D Unspecified open wound of right great toe with damage to nail, subsequent encounter: Secondary | ICD-10-CM | POA: Diagnosis not present

## 2018-02-23 DIAGNOSIS — I10 Essential (primary) hypertension: Secondary | ICD-10-CM | POA: Diagnosis not present

## 2018-02-23 DIAGNOSIS — L03031 Cellulitis of right toe: Secondary | ICD-10-CM | POA: Diagnosis not present

## 2018-02-23 DIAGNOSIS — G609 Hereditary and idiopathic neuropathy, unspecified: Secondary | ICD-10-CM | POA: Diagnosis not present

## 2018-02-23 DIAGNOSIS — M47816 Spondylosis without myelopathy or radiculopathy, lumbar region: Secondary | ICD-10-CM | POA: Diagnosis not present

## 2018-03-01 DIAGNOSIS — H353222 Exudative age-related macular degeneration, left eye, with inactive choroidal neovascularization: Secondary | ICD-10-CM | POA: Diagnosis not present

## 2018-03-01 DIAGNOSIS — H353124 Nonexudative age-related macular degeneration, left eye, advanced atrophic with subfoveal involvement: Secondary | ICD-10-CM | POA: Diagnosis not present

## 2018-03-01 DIAGNOSIS — H353212 Exudative age-related macular degeneration, right eye, with inactive choroidal neovascularization: Secondary | ICD-10-CM | POA: Diagnosis not present

## 2018-03-04 ENCOUNTER — Other Ambulatory Visit: Payer: Self-pay | Admitting: Nurse Practitioner

## 2018-03-04 DIAGNOSIS — I10 Essential (primary) hypertension: Secondary | ICD-10-CM

## 2018-03-05 ENCOUNTER — Other Ambulatory Visit: Payer: Medicare Other

## 2018-03-05 DIAGNOSIS — R634 Abnormal weight loss: Secondary | ICD-10-CM | POA: Diagnosis not present

## 2018-03-05 DIAGNOSIS — I10 Essential (primary) hypertension: Secondary | ICD-10-CM | POA: Diagnosis not present

## 2018-03-05 DIAGNOSIS — E782 Mixed hyperlipidemia: Secondary | ICD-10-CM

## 2018-03-05 LAB — COMPLETE METABOLIC PANEL WITH GFR
AG Ratio: 1.7 (calc) (ref 1.0–2.5)
ALT: 12 U/L (ref 6–29)
AST: 16 U/L (ref 10–35)
Albumin: 4.1 g/dL (ref 3.6–5.1)
Alkaline phosphatase (APISO): 65 U/L (ref 33–130)
BUN: 15 mg/dL (ref 7–25)
CO2: 40 mmol/L — ABNORMAL HIGH (ref 20–32)
Calcium: 10 mg/dL (ref 8.6–10.4)
Chloride: 86 mmol/L — ABNORMAL LOW (ref 98–110)
Creat: 0.76 mg/dL (ref 0.60–0.88)
GFR, Est African American: 83 mL/min/{1.73_m2} (ref >=60)
GFR, Est Non African American: 72 mL/min/{1.73_m2} (ref >=60)
Globulin: 2.4 g/dL (calc) (ref 1.9–3.7)
Glucose, Bld: 113 mg/dL — ABNORMAL HIGH (ref 65–99)
Potassium: 3.3 mmol/L — ABNORMAL LOW (ref 3.5–5.3)
Sodium: 131 mmol/L — ABNORMAL LOW (ref 135–146)
Total Bilirubin: 0.5 mg/dL (ref 0.2–1.2)
Total Protein: 6.5 g/dL (ref 6.1–8.1)

## 2018-03-05 LAB — CBC WITH DIFFERENTIAL/PLATELET
Basophils Absolute: 10 cells/uL (ref 0–200)
Basophils Relative: 0.2 %
Eosinophils Absolute: 29 cells/uL (ref 15–500)
Eosinophils Relative: 0.6 %
HCT: 38.5 % (ref 35.0–45.0)
Hemoglobin: 13.1 g/dL (ref 11.7–15.5)
Lymphs Abs: 813 cells/uL — ABNORMAL LOW (ref 850–3900)
MCH: 29.4 pg (ref 27.0–33.0)
MCHC: 34 g/dL (ref 32.0–36.0)
MCV: 86.3 fL (ref 80.0–100.0)
MPV: 9.7 fL (ref 7.5–12.5)
Monocytes Relative: 6.9 %
Neutro Abs: 3709 cells/uL (ref 1500–7800)
Neutrophils Relative %: 75.7 %
Platelets: 144 10*3/uL (ref 140–400)
RBC: 4.46 10*6/uL (ref 3.80–5.10)
RDW: 12.4 % (ref 11.0–15.0)
Total Lymphocyte: 16.6 %
WBC mixed population: 338 cells/uL (ref 200–950)
WBC: 4.9 10*3/uL (ref 3.8–10.8)

## 2018-03-05 LAB — LIPID PANEL
Cholesterol: 175 mg/dL (ref <200)
HDL: 59 mg/dL (ref >50)
LDL Cholesterol (Calc): 92 mg/dL (calc)
Non-HDL Cholesterol (Calc): 116 mg/dL (calc) (ref <130)
Total CHOL/HDL Ratio: 3 (calc) (ref <5.0)
Triglycerides: 147 mg/dL (ref <150)

## 2018-03-08 ENCOUNTER — Other Ambulatory Visit: Payer: Self-pay | Admitting: *Deleted

## 2018-03-08 MED ORDER — POTASSIUM CHLORIDE CRYS ER 20 MEQ PO TBCR: 20.0000 meq | EXTENDED_RELEASE_TABLET | Freq: Every day | ORAL | 3 refills | Status: DC

## 2018-03-09 ENCOUNTER — Other Ambulatory Visit: Payer: Self-pay

## 2018-03-09 ENCOUNTER — Telehealth: Payer: Self-pay

## 2018-03-09 DIAGNOSIS — F411 Generalized anxiety disorder: Secondary | ICD-10-CM

## 2018-03-09 MED ORDER — POTASSIUM CHLORIDE ER 10 MEQ PO TBCR: 20.0000 meq | EXTENDED_RELEASE_TABLET | Freq: Every day | ORAL | 3 refills | Status: DC

## 2018-03-09 MED ORDER — ALPRAZOLAM 0.5 MG PO TABS
ORAL_TABLET | ORAL | 0 refills | Status: DC
Start: 2018-03-09 — End: 2018-04-16

## 2018-03-09 NOTE — Telephone Encounter (Signed)
Patient called requesting refill on Alprazolam  RX called into pharmacy as requested

## 2018-03-09 NOTE — Telephone Encounter (Signed)
Patient called to inform Dr.Reed that she can not take Potassium 20 meq for the tablet is too big. Patient had a past experience with potassium 20 meq.  Patient is requesting 10 meq 2 by mouth daily  Per verbal conversation with Dr.Reed ok to change

## 2018-03-12 ENCOUNTER — Other Ambulatory Visit: Payer: Medicare Other

## 2018-03-19 ENCOUNTER — Encounter: Payer: Self-pay | Admitting: Internal Medicine

## 2018-03-19 ENCOUNTER — Ambulatory Visit (INDEPENDENT_AMBULATORY_CARE_PROVIDER_SITE_OTHER): Payer: Medicare Other | Admitting: Internal Medicine

## 2018-03-19 VITALS — BP 140/84 | HR 71 | Temp 97.8°F | Ht 63.0 in | Wt 113.4 lb

## 2018-03-19 DIAGNOSIS — E039 Hypothyroidism, unspecified: Secondary | ICD-10-CM

## 2018-03-19 DIAGNOSIS — H3530 Unspecified macular degeneration: Secondary | ICD-10-CM

## 2018-03-19 DIAGNOSIS — E782 Mixed hyperlipidemia: Secondary | ICD-10-CM | POA: Diagnosis not present

## 2018-03-19 DIAGNOSIS — I10 Essential (primary) hypertension: Secondary | ICD-10-CM

## 2018-03-19 DIAGNOSIS — E876 Hypokalemia: Secondary | ICD-10-CM | POA: Diagnosis not present

## 2018-03-19 DIAGNOSIS — R634 Abnormal weight loss: Secondary | ICD-10-CM | POA: Diagnosis not present

## 2018-03-19 LAB — BASIC METABOLIC PANEL
BUN: 17 mg/dL (ref 7–25)
CO2: 38 mmol/L — ABNORMAL HIGH (ref 20–32)
Calcium: 9.9 mg/dL (ref 8.6–10.4)
Chloride: 90 mmol/L — ABNORMAL LOW (ref 98–110)
Creat: 0.82 mg/dL (ref 0.60–0.88)
Glucose, Bld: 85 mg/dL (ref 65–139)
Potassium: 4.1 mmol/L (ref 3.5–5.3)
Sodium: 132 mmol/L — ABNORMAL LOW (ref 135–146)

## 2018-03-19 NOTE — Progress Notes (Signed)
Location:  [facility] clinic Provider:  Koury Roddy L. Renato Gails, D.O., C.M.D.  Code Status: DNR Goals of Care:  Advanced Directives 12/27/2017  Does Patient Have a Medical Advance Directive? Yes  Type of Estate agent of [place];Living will;Out of facility DNR (pink MOST or yellow form)  Does patient want to make changes to medical advance directive? No - Patient declined  Copy of Healthcare Power of Attorney in Chart? Yes  Would patient like information on creating a medical advance directive? -  Pre-existing out of facility DNR order (yellow form or pink MOST form) Yellow form placed in chart (order not valid for inpatient use)     Chief Complaint  Patient presents with  . Medical Management of Chronic Issues    Pt is being seen for a 4 month routine visit.   . Audit C Screening    Score of 0    HPI: Patient is a 84 y.o. female seen today for medical management of chronic diseases.    Weighs 113.2 which is stable from last time.  She is trying to eat more.  Trying to have her shake at 1-1:30pm which is 250 calories and 7-10 g sugar.  She is not into sweets these days.  Drinks juices and dilutes them.  She does not eat ice cream.  They went out to dinner and a little less than 3oz filet with salad, potato and bread.  She is not a fan of the food where they live.  Does get grilled chicken breast, tomatoes, salad.  Eats 1/2 sandwich.  Likes chicken pie, mashed potatoes, green beans.  Likes the [employer].  Other beef there gave her diarrhea.  Does like bacon weekly, eggs, grits or oatmeal at [employer].  Eats a 1/2 banana per day.  She's taking her vitamins and snacks.  Says she's lost her weight in her arms and bosom.    She's getting more energy lately.  Her back is tired, but not really painful. Uses her walker in the facility.  Rests for an hour in the afternoon which helps her.    Vision getting worse and worse.    Reviewed labs with her.    Potassium was slightly  low.  We increased potassium to daily from .  Taking 2 10s which are white and look like tylenols.    Takes probiotics when she eats beef items ever since her gallbladder surgery.  Uses colace and activia yogurt.    Past Medical History:  Diagnosis Date  . Allergic rhinitis due to pollen   . Anxiety state, unspecified   . Calculus of kidney   . Cataract   . Diaphragmatic hernia without mention of obstruction or gangrene   . Diffuse cystic mastopathy   . Dizziness and giddiness   . GERD (gastroesophageal reflux disease)   . Hiatal hernia   . Insomnia, unspecified   . Insomnia, unspecified   . Lumbago   . Macular degeneration (senile) of retina, unspecified   . Open wound of toe(s), without mention of complication   . Osteoarthrosis, unspecified whether generalized or localized, unspecified site   . Other and unspecified hyperlipidemia   . Personal history of fall   . Sciatica   . Sebaceous cyst   . Senile osteoporosis   . Unspecified constipation   . Unspecified essential hypertension   . Unspecified hereditary and idiopathic peripheral neuropathy   . Unspecified hypothyroidism   . Unspecified tinnitus     Past Surgical History:  Procedure  Laterality Date  . CATARACT EXTRACTION, BILATERAL    . CHOLECYSTECTOMY  07/25/2007  . TONSILLECTOMY AND ADENOIDECTOMY Bilateral 1943    Allergies  Allergen Reactions  . Ambien [Zolpidem Tartrate]     hallucination  . Bactrim [Sulfamethoxazole-Trimethoprim]     Unknown reaction   . Morphine And Related Nausea And Vomiting  . Zithromax [Azithromycin]     Not effective      Outpatient Encounter Medications as of 03/19/2018  Medication Sig  . acetaminophen (TYLENOL) 500 MG tablet Take 500 mg by mouth every 4 (four) hours as needed.  . ALPRAZolam (XANAX) 0.5 MG tablet TAKE 1 TABLET 3 TIMES A DAY AS NEEDED FOR ANXIETY  . Cyanocobalamin (VITAMIN B-12 PO) Take 1 tablet by mouth daily. Vitamin B-12  . doxazosin (CARDURA) 4 MG  tablet TAKE 1 TABLET (4 MG TOTAL) BY MOUTH DAILY TO CONTROL BP  . indapamide (LOZOL) 1.25 MG tablet TAKE 1 TABLET BY MOUTH EVERY DAY TO HELP CONTROL BLOOD PRESSURE  . levothyroxine (SYNTHROID, LEVOTHROID) 100 MCG tablet TAKE 1 TABLET EVERY DAY BEFORE BREAKFAST  . loratadine (CLARITIN) 10 MG tablet Take 10 mg by mouth daily.   Marland Kitchen losartan (COZAAR) 100 MG tablet TAKE 1 TABLET BY MOUTH EVERY DAY TO CONTROL BLOOD PRESSURE  . metoprolol succinate (TOPROL-XL) 100 MG 24 hr tablet TAKE 1 TABLET BY MOUTH EVERY DAY WITH A MEAL TO CONTROL BLOOD PRESSURE  . Multiple Vitamins-Minerals (CENTRUM SILVER) tablet Take 1 tablet by mouth daily.  . pantoprazole (PROTONIX) 40 MG tablet Take 1 tablet (40 mg total) by mouth daily.  . polyethylene glycol (MIRALAX / GLYCOLAX) packet Take 17 g by mouth daily.   . potassium chloride (K-DUR) 10 MEQ tablet Take 2 tablets (20 mEq total) by mouth daily.  Marland Kitchen Propylene Glycol (SYSTANE BALANCE) 0.6 % SOLN Place 1 drop into both eyes 2 (two) times daily.  . sertraline (ZOLOFT) 50 MG tablet TAKE 1 TABLET BY MOUTH EVERY DAY TO CALM NERVES  . temazepam (RESTORIL) 30 MG capsule TAKE ONE CAPSULE AT BEDTIME AS NEEDED FOR SLEEP  . UNABLE TO FIND APPLY 1-2 GMS TO AFFECTED AREA 3-4 TIMES DAILY AS NEEDED FOR PAIN. THIS IS A COMPOUNDED CREAM.1 PUMP EQUALS 1 GRAM.  . [DISCONTINUED] Alpha-D-Galactosidase (BEANO) TABS Take by mouth. Take daily before meals   No facility-administered encounter medications on file as of 03/19/2018.     Review of Systems:  Review of Systems  Constitutional: Positive for weight loss. Negative for chills, diaphoresis, fever and malaise/fatigue.  HENT: Positive for hearing loss. Negative for congestion.   Eyes: Positive for blurred vision.  Respiratory: Negative for cough and shortness of breath.   Cardiovascular: Negative for chest pain, palpitations and leg swelling.  Gastrointestinal: Positive for constipation and diarrhea. Negative for abdominal pain, blood in  stool, heartburn, melena, nausea and vomiting.  Genitourinary: Negative for dysuria, frequency and urgency.  Musculoskeletal: Positive for back pain. Negative for falls and joint pain.  Skin: Negative for itching and rash.  Neurological: Negative for dizziness and loss of consciousness.  Endo/Heme/Allergies: Bruises/bleeds easily.  Psychiatric/Behavioral: Negative for depression and memory loss. The patient is not nervous/anxious and does not have insomnia.     Health Maintenance  Topic Date Due  . INFLUENZA VACCINE  04/16/2018 (Originally 03/15/2018)  . TETANUS/TDAP  06/16/2027  . DEXA SCAN  Completed  . PNA vac Low Risk Adult  Completed    Physical Exam: Vitals:   03/19/18 1003  BP: 140/84  Pulse: 71  Temp: 97.8  F (36.6 C)  TempSrc: Oral  SpO2: 97%  Weight: 113 lb 6.4 oz (51.4 kg)  Height: 5\' 3"  (1.6 m)   Body mass index is 20.09 kg/m. Physical Exam  Constitutional: She is oriented to person, place, and time. She appears well-developed. No distress.  Thin female  HENT:  Head: Normocephalic and atraumatic.  Hearing aids  Eyes:  glasses  Cardiovascular: Normal rate, regular rhythm and intact distal pulses.  Murmur heard. Pulmonary/Chest: Effort normal and breath sounds normal. No respiratory distress.  Abdominal: Soft. Bowel sounds are normal. She exhibits no distension.  Musculoskeletal: Normal range of motion.  Walks with cane  Neurological: She is alert and oriented to person, place, and time.  Skin: Skin is warm and dry. Capillary refill takes less than 2 seconds.  Psychiatric: She has a normal mood and affect.    Labs reviewed: Basic Metabolic Panel: Recent Labs    08/29/17 1009 12/27/17 1026 03/05/18 1017  NA 136 132* 131*  K 3.6 3.5 3.3*  CL 92* 88* 86*  CO2 37* 37* 40*  GLUCOSE 100* 109* 113*  BUN 16 16 15   CREATININE 0.84 0.72 0.76  CALCIUM 9.8 9.9 10.0  TSH 5.63* 5.59*  --    Liver Function Tests: Recent Labs    08/29/17 1009  03/05/18 1017  AST 14 16  ALT 9 12  BILITOT 0.6 0.5  PROT 6.3 6.5   No results for input(s): LIPASE, AMYLASE in the last 8760 hours. No results for input(s): AMMONIA in the last 8760 hours. CBC: Recent Labs    08/29/17 1009 03/05/18 1017  WBC 3.9 4.9  NEUTROABS 2,687 3,709  HGB 12.9 13.1  HCT 39.5 38.5  MCV 85.7 86.3  PLT 150 144   Lipid Panel: Recent Labs    08/29/17 1009 03/05/18 1017  CHOL 198 175  HDL 61 59  LDLCALC 115* 92  TRIG 113 147  CHOLHDL 3.2 3.0   Lab Results  Component Value Date   HGBA1C  07/23/2007    5.7 (NOTE)   The ADA recommends the following therapeutic goals for glycemic   control related to Hgb A1C measurement:   Goal of Therapy:   < 7.0% Hgb A1C   Action Suggested:  > 8.0% Hgb A1C   Ref:  Diabetes Care, 22, Suppl. 1, 1999    Assessment/Plan 1. Weight loss -has stabilized and she's trying to eat more and drink her shakes as recommended--encouraged this  2. Macular degeneration (senile) of retina -progressing and she is no longer able to see except very close up  3. Hypokalemia - cont increased dose of potassium and f/u lab today - Basic metabolic panel  4. Essential hypertension -bp slightly elevated, but gets dizzy with lower values so cont same regimen  5. Mixed hyperlipidemia - cont diet and regular walking - CBC with Differential/Platelet; Future - COMPLETE METABOLIC PANEL WITH GFR; Future  6. Hypothyroidism, adult - cont levothyroxine as ordered - TSH; Future  Labs/tests ordered:   Orders Placed This Encounter  Procedures  . Basic metabolic panel    Order Specific Question:   Has the patient fasted?    Answer:   Yes  . CBC with Differential/Platelet    Standing Status:   Future    Standing Expiration Date:   11/18/2018  . COMPLETE METABOLIC PANEL WITH GFR    Standing Status:   Future    Standing Expiration Date:   11/18/2018  . TSH    Standing Status:   Future  Standing Expiration Date:   11/18/2018   Next appt:   07/26/2018 med mgt, fasting labs before   Rayn Enderson L. Ishmail Mcmanamon, D.O. Geriatrics [facility] [address] [place] [place], [place] [address] Cell Phone (Mon-Fri 8am-5pm):  [phone] On Call:  [phone] & follow prompts after 5pm & weekends Office Phone:  [phone] Office Fax:  [phone]

## 2018-03-26 IMAGING — MR MR LUMBAR SPINE W/O CM
4 of 5 series · 26 of 48 positions shown · non-contrast
Comparison: CT abdomen and pelvis 07/24/2007.

CLINICAL DATA: Bilateral lower extremity weakness about the knees
for approximately 1 year. Bilateral foot numbness.

EXAM:
MRI LUMBAR SPINE WITHOUT CONTRAST
TECHNIQUE: Multiplanar, multisequence MR imaging of the lumbar spine was
performed. No intravenous contrast was administered.

[Series 3: T1 · sagittal · 4.0mm · 0.84mm/px · 6 of 14 slices shown (1 of 2)]
[im 1/14]
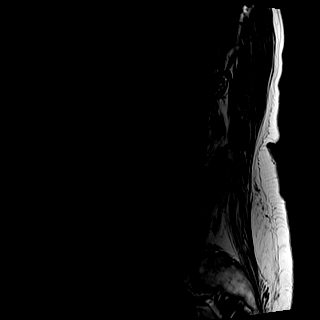
[im 3/14]
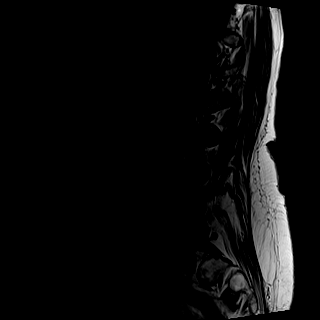
[im 6/14]
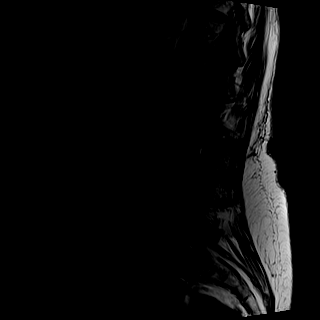
[im 8/14]
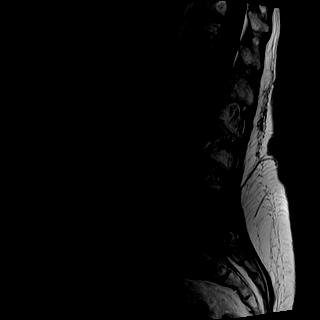
[im 11/14]
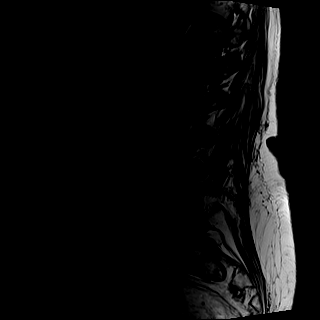
[im 14/14]
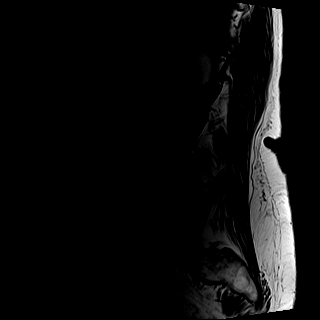

[Series 4: T2 · sagittal · 4.0mm · 0.42mm/px · 6 of 14 slices shown (1 of 2)]
[im 1/14]
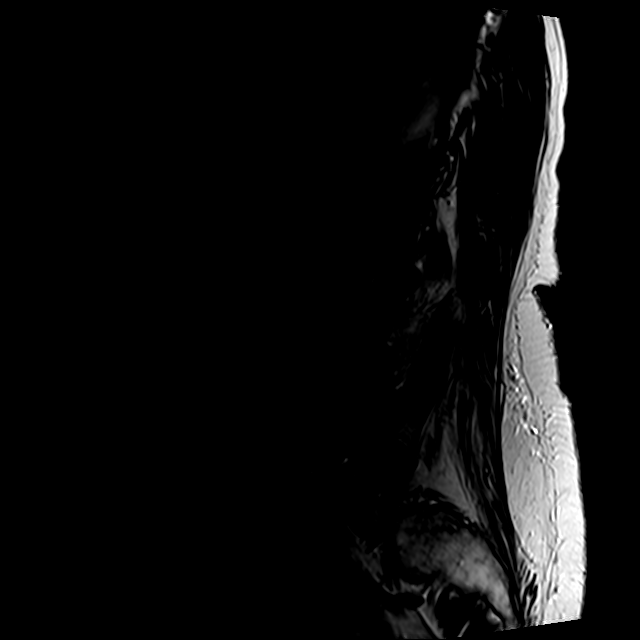
[im 3/14]
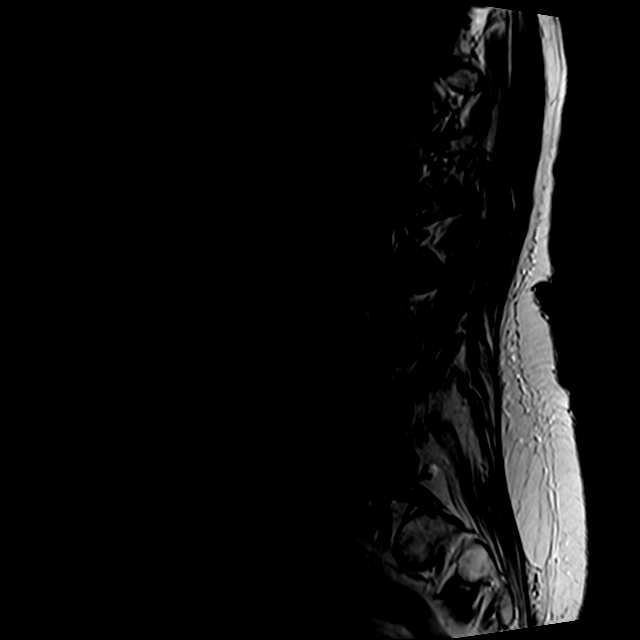
[im 6/14]
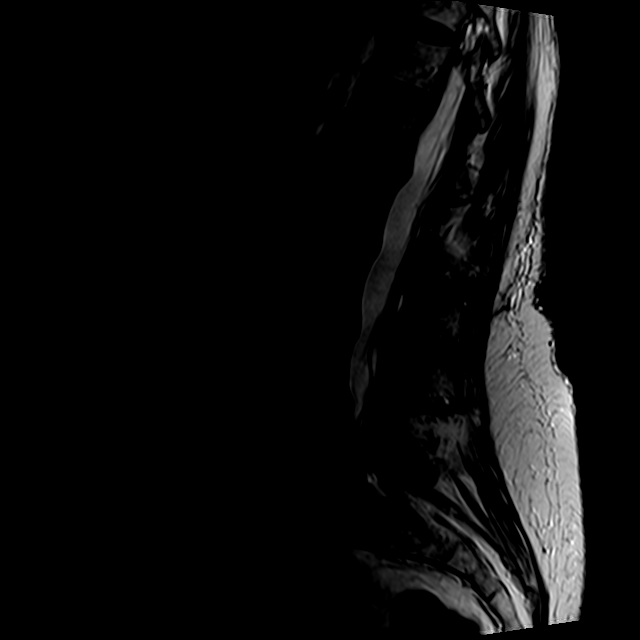
[im 8/14]
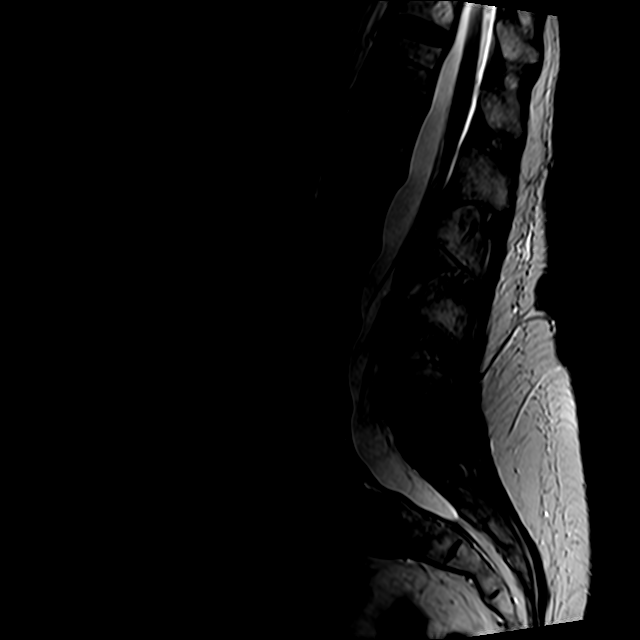
[im 11/14]
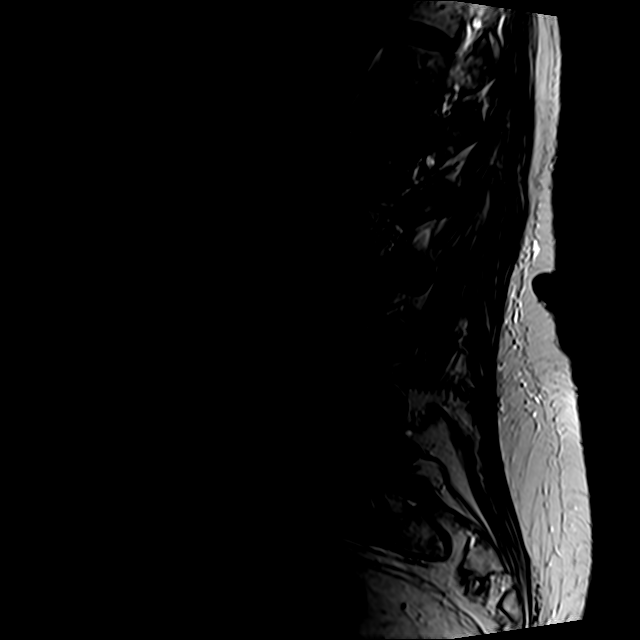
[im 14/14]
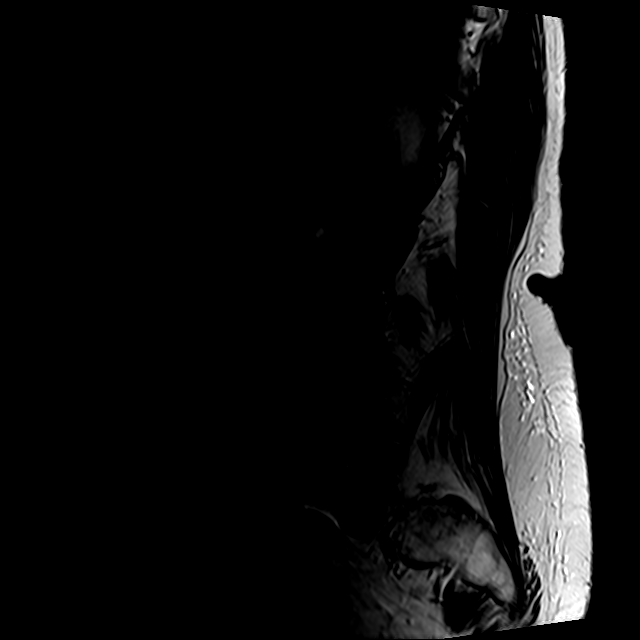

[Series 5: T2 · axial · 4.0mm · 0.70mm/px · z∈[-67,+112]mm · 9 of 35 slices shown (2 of 2)]
[im 1/35]
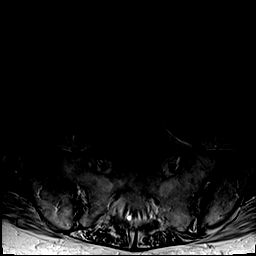
[im 5/35]
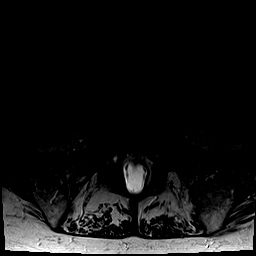
[im 10/35]
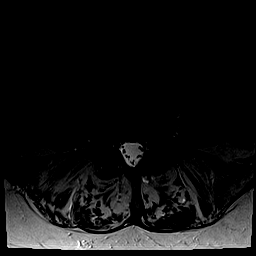
[im 15/35]
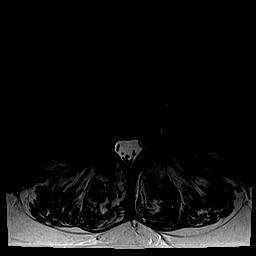
[im 18/35]
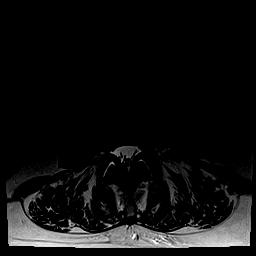
[im 20/35]
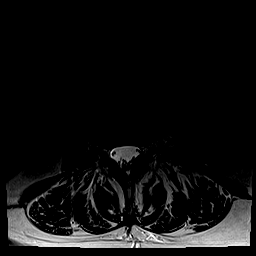
[im 25/35]
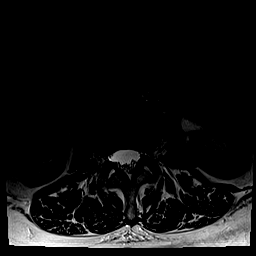
[im 30/35]
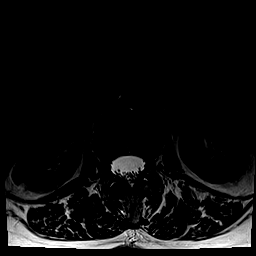
[im 35/35]
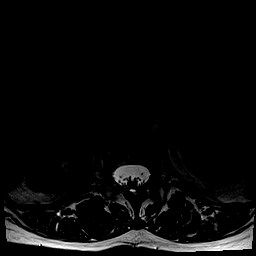

[Series 7: T1 · axial · 4.0mm · 0.35mm/px · z∈[-67,+86]mm · 5 of 35 slices shown (2 of 2)]
[im 1/35]
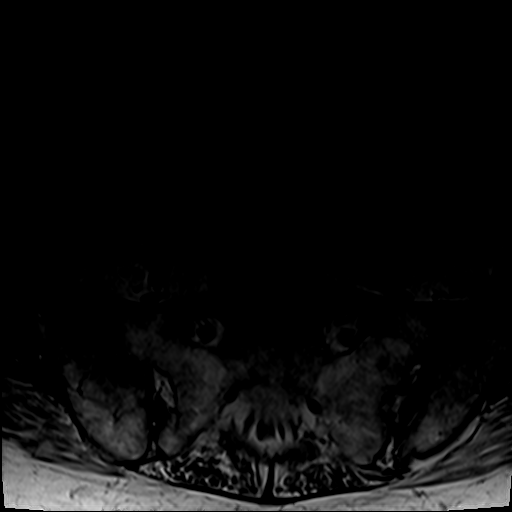
[im 5/35]
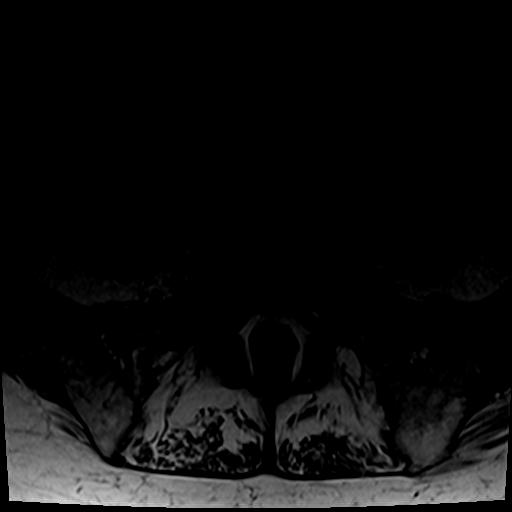
[im 10/35]
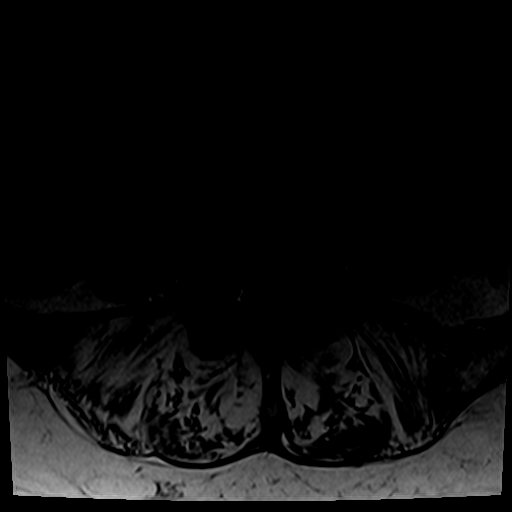
[im 18/35]
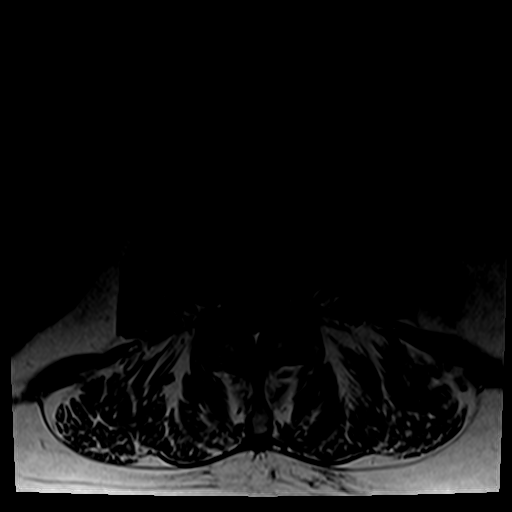
[im 30/35]
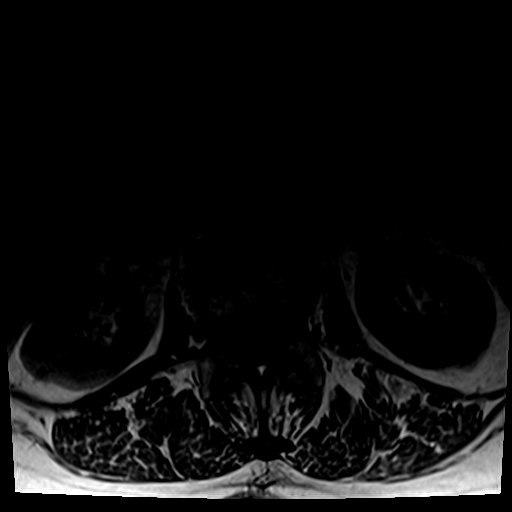

[26 of 48 positions shown; findings below may reference images not displayed]

FINDINGS: Segmentation: Unremarkable. Rudimentary disc material at S1-2
incidentally noted.

Alignment:  Maintained.  Mild convex right scoliosis is noted.

Vertebrae: Vertebral body height is maintained. Hemangiomas are seen
in T11 and L5. No worrisome marrow lesion.

Conus medullaris: Extends to the L1-2 level and appears normal.

Paraspinal and other soft tissues: Small right renal cysts are
identified. Otherwise unremarkable.

Disc levels:

T11-12 and T12-L1 are imaged in the sagittal plane only and
negative.

L1-2:  Negative.

L2-3:  Negative.

L3-4: Minimal disc bulge and mild facet degenerative change. The
central canal and foramina are widely patent.

L4-5: There is some ligamentum flavum thickening and a shallow disc
bulge. Mild central canal narrowing is seen. The foramina are open.

L5-S1: Mild facet degenerative change and a shallow central
protrusion are identified. The central canal and foramina are widely
patent.
IMPRESSION: No finding to explain the patient's symptoms. Mild central canal
narrowing is seen at L4-5. No nerve root compression is identified.

Mild convex right scoliosis.

## 2018-03-30 DIAGNOSIS — H6123 Impacted cerumen, bilateral: Secondary | ICD-10-CM | POA: Diagnosis not present

## 2018-04-02 ENCOUNTER — Other Ambulatory Visit: Payer: Self-pay | Admitting: Internal Medicine

## 2018-04-02 DIAGNOSIS — I10 Essential (primary) hypertension: Secondary | ICD-10-CM

## 2018-04-16 ENCOUNTER — Other Ambulatory Visit: Payer: Self-pay | Admitting: Internal Medicine

## 2018-04-16 DIAGNOSIS — F411 Generalized anxiety disorder: Secondary | ICD-10-CM

## 2018-05-07 ENCOUNTER — Other Ambulatory Visit: Payer: Self-pay | Admitting: Internal Medicine

## 2018-05-10 ENCOUNTER — Ambulatory Visit (INDEPENDENT_AMBULATORY_CARE_PROVIDER_SITE_OTHER): Payer: Medicare Other

## 2018-05-10 ENCOUNTER — Telehealth: Payer: Self-pay

## 2018-05-10 DIAGNOSIS — Z23 Encounter for immunization: Secondary | ICD-10-CM | POA: Diagnosis not present

## 2018-05-10 NOTE — Telephone Encounter (Signed)
Patient would like rx for potassium changed to 20 meq vs taking  2 of the 10 meq tablets   Patient states the 10 meq tablet is about the same size as the 20 meq tablet so she might as well take the 20 meq  [facility] [address]   Dr.Reed please advise

## 2018-05-10 NOTE — Telephone Encounter (Signed)
Fine with me

## 2018-05-11 MED ORDER — POTASSIUM CHLORIDE CRYS ER 20 MEQ PO TBCR: 20.0000 meq | EXTENDED_RELEASE_TABLET | Freq: Every day | ORAL | 1 refills | Status: DC

## 2018-05-11 NOTE — Telephone Encounter (Signed)
Medication list updated to reflect change, rx sent to pharmacy for a 90 day supply as requested. Patient's husband aware request complete

## 2018-05-18 ENCOUNTER — Other Ambulatory Visit: Payer: Self-pay | Admitting: Nurse Practitioner

## 2018-05-18 DIAGNOSIS — I10 Essential (primary) hypertension: Secondary | ICD-10-CM

## 2018-05-20 ENCOUNTER — Other Ambulatory Visit: Payer: Self-pay | Admitting: Nurse Practitioner

## 2018-05-21 ENCOUNTER — Other Ambulatory Visit: Payer: Self-pay | Admitting: Internal Medicine

## 2018-05-21 DIAGNOSIS — F411 Generalized anxiety disorder: Secondary | ICD-10-CM

## 2018-05-29 DIAGNOSIS — H353222 Exudative age-related macular degeneration, left eye, with inactive choroidal neovascularization: Secondary | ICD-10-CM | POA: Diagnosis not present

## 2018-05-29 DIAGNOSIS — H43811 Vitreous degeneration, right eye: Secondary | ICD-10-CM | POA: Diagnosis not present

## 2018-05-29 DIAGNOSIS — H353113 Nonexudative age-related macular degeneration, right eye, advanced atrophic without subfoveal involvement: Secondary | ICD-10-CM | POA: Diagnosis not present

## 2018-05-29 DIAGNOSIS — H353124 Nonexudative age-related macular degeneration, left eye, advanced atrophic with subfoveal involvement: Secondary | ICD-10-CM | POA: Diagnosis not present

## 2018-05-29 DIAGNOSIS — H353212 Exudative age-related macular degeneration, right eye, with inactive choroidal neovascularization: Secondary | ICD-10-CM | POA: Diagnosis not present

## 2018-06-24 ENCOUNTER — Other Ambulatory Visit: Payer: Self-pay | Admitting: Internal Medicine

## 2018-06-24 DIAGNOSIS — I10 Essential (primary) hypertension: Secondary | ICD-10-CM

## 2018-06-25 ENCOUNTER — Other Ambulatory Visit: Payer: Self-pay | Admitting: Internal Medicine

## 2018-06-25 DIAGNOSIS — F411 Generalized anxiety disorder: Secondary | ICD-10-CM

## 2018-07-16 ENCOUNTER — Other Ambulatory Visit: Payer: Self-pay | Admitting: Internal Medicine

## 2018-07-23 ENCOUNTER — Other Ambulatory Visit: Payer: Medicare Other

## 2018-07-23 DIAGNOSIS — E782 Mixed hyperlipidemia: Secondary | ICD-10-CM

## 2018-07-23 DIAGNOSIS — E039 Hypothyroidism, unspecified: Secondary | ICD-10-CM | POA: Diagnosis not present

## 2018-07-24 LAB — COMPLETE METABOLIC PANEL WITH GFR
AG Ratio: 1.8 (calc) (ref 1.0–2.5)
ALT: 10 U/L (ref 6–29)
AST: 15 U/L (ref 10–35)
Albumin: 4.1 g/dL (ref 3.6–5.1)
Alkaline phosphatase (APISO): 66 U/L (ref 33–130)
BUN: 18 mg/dL (ref 7–25)
CO2: 39 mmol/L — ABNORMAL HIGH (ref 20–32)
Calcium: 9.7 mg/dL (ref 8.6–10.4)
Chloride: 88 mmol/L — ABNORMAL LOW (ref 98–110)
Creat: 0.76 mg/dL (ref 0.60–0.88)
GFR, Est African American: 83 mL/min/{1.73_m2} (ref >=60)
GFR, Est Non African American: 72 mL/min/{1.73_m2} (ref >=60)
Globulin: 2.3 g/dL (calc) (ref 1.9–3.7)
Glucose, Bld: 98 mg/dL (ref 65–99)
Potassium: 3.6 mmol/L (ref 3.5–5.3)
Sodium: 132 mmol/L — ABNORMAL LOW (ref 135–146)
Total Bilirubin: 0.5 mg/dL (ref 0.2–1.2)
Total Protein: 6.4 g/dL (ref 6.1–8.1)

## 2018-07-24 LAB — CBC WITH DIFFERENTIAL/PLATELET
Basophils Absolute: 11 cells/uL (ref 0–200)
Basophils Relative: 0.3 %
Eosinophils Absolute: 30 cells/uL (ref 15–500)
Eosinophils Relative: 0.8 %
HCT: 39.1 % (ref 35.0–45.0)
Hemoglobin: 12.9 g/dL (ref 11.7–15.5)
Lymphs Abs: 840 cells/uL — ABNORMAL LOW (ref 850–3900)
MCH: 29.1 pg (ref 27.0–33.0)
MCHC: 33 g/dL (ref 32.0–36.0)
MCV: 88.1 fL (ref 80.0–100.0)
MPV: 9.5 fL (ref 7.5–12.5)
Monocytes Relative: 7.9 %
Neutro Abs: 2527 cells/uL (ref 1500–7800)
Neutrophils Relative %: 68.3 %
Platelets: 143 10*3/uL (ref 140–400)
RBC: 4.44 10*6/uL (ref 3.80–5.10)
RDW: 12.3 % (ref 11.0–15.0)
Total Lymphocyte: 22.7 %
WBC mixed population: 292 cells/uL (ref 200–950)
WBC: 3.7 10*3/uL — ABNORMAL LOW (ref 3.8–10.8)

## 2018-07-24 LAB — TSH: TSH: 6.71 mIU/L — ABNORMAL HIGH (ref 0.40–4.50)

## 2018-07-26 ENCOUNTER — Ambulatory Visit: Payer: Medicare Other | Admitting: Internal Medicine

## 2018-07-31 ENCOUNTER — Encounter: Payer: Self-pay | Admitting: Nurse Practitioner

## 2018-07-31 ENCOUNTER — Ambulatory Visit (INDEPENDENT_AMBULATORY_CARE_PROVIDER_SITE_OTHER): Payer: Medicare Other | Admitting: Nurse Practitioner

## 2018-07-31 VITALS — BP 122/80 | HR 60 | Temp 97.3°F | Ht 63.0 in | Wt 119.8 lb

## 2018-07-31 DIAGNOSIS — E2839 Other primary ovarian failure: Secondary | ICD-10-CM

## 2018-07-31 DIAGNOSIS — E876 Hypokalemia: Secondary | ICD-10-CM

## 2018-07-31 DIAGNOSIS — I10 Essential (primary) hypertension: Secondary | ICD-10-CM

## 2018-07-31 DIAGNOSIS — F411 Generalized anxiety disorder: Secondary | ICD-10-CM

## 2018-07-31 DIAGNOSIS — K5900 Constipation, unspecified: Secondary | ICD-10-CM | POA: Diagnosis not present

## 2018-07-31 DIAGNOSIS — E039 Hypothyroidism, unspecified: Secondary | ICD-10-CM | POA: Diagnosis not present

## 2018-07-31 DIAGNOSIS — R634 Abnormal weight loss: Secondary | ICD-10-CM | POA: Diagnosis not present

## 2018-07-31 DIAGNOSIS — E782 Mixed hyperlipidemia: Secondary | ICD-10-CM | POA: Diagnosis not present

## 2018-07-31 MED ORDER — ALPRAZOLAM 0.5 MG PO TABS: ORAL_TABLET | ORAL | 0 refills | Status: DC

## 2018-07-31 NOTE — Progress Notes (Signed)
Careteam: Patient Care Team: Kermit Balo, DO as PCP - General (Geriatric Medicine) Edmon Crape, MD as Consulting Physician (Ophthalmology) Donzetta Starch, MD as Consulting Physician (Dermatology) Hart Carwin, MD (Inactive) as Consulting Physician (Gastroenterology)  Advanced Directive information Does Patient Have a Medical Advance Directive?: Yes, Type of Advance Directive: Healthcare Power of [place];Out of facility DNR (pink MOST or yellow form), Pre-existing out of facility DNR order (yellow form or pink MOST form): Yellow form placed in chart (order not valid for inpatient use), Does patient want to make changes to medical advance directive?: No - Patient declined  Allergies  Allergen Reactions  . Ambien [Zolpidem Tartrate]     hallucination  . Bactrim [Sulfamethoxazole-Trimethoprim]     Unknown reaction   . Morphine And Related Nausea And Vomiting  . Zithromax [Azithromycin]     Not effective      Chief Complaint  Patient presents with  . Medical Management of Chronic Issues    4 month follow up and discuss labs (copy printed)     HPI: Patient is a 84 y.o. female seen in the office today for routine follow up.  Pt of Dr Hebert Soho.   Weighs 119 which is up from 113 4 months ago  She is trying to eat more.  continues to have her protein shake, tolerates it well, gives her energy and appetite. 250 calories and 12 g sugar.    Pt taking a lot of vitamins and wants to cut back. Not taking calcium and vit D   Hypokalemia- taking KCL 20 meq - K 3.6  Hypothyroidism-  Recent TSH was elevated to 6.71, she reports TSH will go up and down, continues on synthroid 100 mcg daily   htn- has been stable, spreading medication out to avoid side effects.   Depression/anxiety- stable on zoloft with xanax in afternoon and bedtime. No side effects noted.   GERD- stable on protonix  Constipation- well controlled on colace and miralax which keeps her regular.    Review of  Systems:  Review of Systems  Constitutional: Negative for chills, diaphoresis, fever, malaise/fatigue and weight loss.  HENT: Positive for hearing loss. Negative for congestion.   Eyes: Positive for blurred vision.  Respiratory: Negative for cough and shortness of breath.   Cardiovascular: Negative for chest pain, palpitations and leg swelling.  Gastrointestinal: Negative for abdominal pain, blood in stool, constipation, diarrhea, heartburn, melena, nausea and vomiting.  Genitourinary: Negative for dysuria, frequency and urgency.  Musculoskeletal: Positive for back pain. Negative for falls and joint pain.  Skin: Negative for itching and rash.  Neurological: Negative for dizziness and loss of consciousness.  Endo/Heme/Allergies: Bruises/bleeds easily.  Psychiatric/Behavioral: Negative for depression and memory loss. The patient is not nervous/anxious and does not have insomnia.     Past Medical History:  Diagnosis Date  . Allergic rhinitis due to pollen   . Anxiety state, unspecified   . Calculus of kidney   . Cataract   . Diaphragmatic hernia without mention of obstruction or gangrene   . Diffuse cystic mastopathy   . Dizziness and giddiness   . GERD (gastroesophageal reflux disease)   . Hiatal hernia   . Insomnia, unspecified   . Insomnia, unspecified   . Lumbago   . Macular degeneration (senile) of retina, unspecified   . Open wound of toe(s), without mention of complication   . Osteoarthrosis, unspecified whether generalized or localized, unspecified site   . Other and unspecified hyperlipidemia   . Personal  history of fall   . Sciatica   . Sebaceous cyst   . Senile osteoporosis   . Unspecified constipation   . Unspecified essential hypertension   . Unspecified hereditary and idiopathic peripheral neuropathy   . Unspecified hypothyroidism   . Unspecified tinnitus    Past Surgical History:  Procedure Laterality Date  . CATARACT EXTRACTION, BILATERAL    .  CHOLECYSTECTOMY  07/25/2007  . TONSILLECTOMY AND ADENOIDECTOMY Bilateral 1943   Social History:   reports that she has never smoked. She has never used smokeless tobacco. She reports that she does not drink alcohol or use drugs.  Family History  Problem Relation Age of Onset  . Hypertension Brother   . Fibromyalgia Daughter   . Hypertension Son   . Hypertension Brother   . Hyperlipidemia Brother   . Breast cancer Neg Hx     Medications: Patient's Medications  New Prescriptions   No medications on file  Previous Medications   ACETAMINOPHEN (TYLENOL) 500 MG TABLET    Take 500 mg by mouth every 4 (four) hours as needed.   ALPRAZOLAM (XANAX) 0.5 MG TABLET    TAKE 1 TABLET BY MOUTH THREE TIMES A DAY AS NEEDED FOR ANXIETY   DOXAZOSIN (CARDURA) 4 MG TABLET    TAKE 1 TABLET (4 MG TOTAL) BY MOUTH DAILY TO CONTROL BP   INDAPAMIDE (LOZOL) 1.25 MG TABLET    TAKE 1 TABLET BY MOUTH EVERY DAY TO HELP CONTROL BLOOD PRESSURE   LEVOTHYROXINE (SYNTHROID, LEVOTHROID) 100 MCG TABLET    TAKE 1 TABLET EVERY DAY BEFORE BREAKFAST   LORATADINE (CLARITIN) 10 MG TABLET    Take 10 mg by mouth daily.    LOSARTAN (COZAAR) 100 MG TABLET    TAKE 1 TABLET BY MOUTH EVERY DAY TO CONTROL BLOOD PRESSURE   METOPROLOL SUCCINATE (TOPROL-XL) 100 MG 24 HR TABLET    TAKE 1 TABLET BY MOUTH EVERY DAY WITH A MEAL TO CONTROL BLOOD PRESSURE   PANTOPRAZOLE (PROTONIX) 40 MG TABLET    Take 1 tablet (40 mg total) by mouth daily.   POLYETHYLENE GLYCOL (MIRALAX / GLYCOLAX) PACKET    Take 17 g by mouth daily.    POTASSIUM CHLORIDE SA (K-DUR,KLOR-CON) 20 MEQ TABLET    Take 1 tablet (20 mEq total) by mouth daily.   PROPYLENE GLYCOL (SYSTANE BALANCE) 0.6 % SOLN    Place 1 drop into both eyes 2 (two) times daily.   SERTRALINE (ZOLOFT) 50 MG TABLET    TAKE 1 TABLET BY MOUTH EVERY DAY TO CALM NERVES   TEMAZEPAM (RESTORIL) 30 MG CAPSULE    TAKE ONE CAPSULE AT BEDTIME AS NEEDED FOR SLEEP   UNABLE TO FIND    APPLY 1-2 GMS TO AFFECTED AREA 3-4  TIMES DAILY AS NEEDED FOR PAIN. THIS IS A COMPOUNDED CREAM.1 PUMP EQUALS 1 GRAM.  Modified Medications   No medications on file  Discontinued Medications   CYANOCOBALAMIN (VITAMIN B-12 PO)    Take 1 tablet by mouth daily. Vitamin B-12   MULTIPLE VITAMINS-MINERALS (CENTRUM SILVER) TABLET    Take 1 tablet by mouth daily.     Physical Exam:  Vitals:   07/31/18 1413  BP: 122/80  Pulse: 60  Temp: (!) 97.3 F (36.3 C)  TempSrc: Oral  SpO2: 90%  Weight: 119 lb 12.8 oz (54.3 kg)  Height: 5\' 3"  (1.6 m)   Body mass index is 21.22 kg/m.  Physical Exam Constitutional:      General: She is not in acute distress.  Appearance: She is well-developed.     Comments: Thin female  HENT:     Head: Normocephalic and atraumatic.  Eyes:     Comments: glasses  Cardiovascular:     Rate and Rhythm: Normal rate and regular rhythm.     Heart sounds: Murmur present.  Pulmonary:     Effort: Pulmonary effort is normal. No respiratory distress.     Breath sounds: Normal breath sounds.  Abdominal:     General: Bowel sounds are normal. There is no distension.     Palpations: Abdomen is soft.  Musculoskeletal: Normal range of motion.     Comments: Walks with cane  Skin:    General: Skin is warm and dry.     Capillary Refill: Capillary refill takes less than 2 seconds.  Neurological:     Mental Status: She is alert and oriented to person, place, and time.     Labs reviewed: Basic Metabolic Panel: Recent Labs    08/29/17 1009 12/27/17 1026 03/05/18 1017 03/19/18 1134 07/23/18 1020  NA 136 132* 131* 132* 132*  K 3.6 3.5 3.3* 4.1 3.6  CL 92* 88* 86* 90* 88*  CO2 37* 37* 40* 38* 39*  GLUCOSE 100* 109* 113* 85 98  BUN 16 16 15 17 18   CREATININE 0.84 0.72 0.76 0.82 0.76  CALCIUM 9.8 9.9 10.0 9.9 9.7  TSH 5.63* 5.59*  --   --  6.71*   Liver Function Tests: Recent Labs    08/29/17 1009 03/05/18 1017 07/23/18 1020  AST 14 16 15   ALT 9 12 10   BILITOT 0.6 0.5 0.5  PROT 6.3 6.5 6.4     No results for input(s): LIPASE, AMYLASE in the last 8760 hours. No results for input(s): AMMONIA in the last 8760 hours. CBC: Recent Labs    08/29/17 1009 03/05/18 1017 07/23/18 1020  WBC 3.9 4.9 3.7*  NEUTROABS 2,687 3,709 2,527  HGB 12.9 13.1 12.9  HCT 39.5 38.5 39.1  MCV 85.7 86.3 88.1  PLT 150 144 143   Lipid Panel: Recent Labs    08/29/17 1009 03/05/18 1017  CHOL 198 175  HDL 61 59  LDLCALC 115* 92  TRIG 113 147  CHOLHDL 3.2 3.0   TSH: Recent Labs    08/29/17 1009 12/27/17 1026 07/23/18 1020  TSH 5.63* 5.59* 6.71*   A1C: Lab Results  Component Value Date   HGBA1C  07/23/2007    5.7 (NOTE)   The ADA recommends the following therapeutic goals for glycemic   control related to Hgb A1C measurement:   Goal of Therapy:   < 7.0% Hgb A1C   Action Suggested:  > 8.0% Hgb A1C   Ref:  Diabetes Care, 22, Suppl. 1, 1999     Assessment/Plan  1. Anxiety state -controlled on current regimen.  - ALPRAZolam (XANAX) 0.5 MG tablet; TAKE 1 TABLET BY MOUTH THREE TIMES A DAY AS NEEDED FOR ANXIETY  Dispense: 90 tablet; Refill: 0  2. Estrogen deficiency - DG Bone Density; Future  3. Weight loss Has improved, weight up at this time on current supplement.   4. Hypokalemia Stable, Potassium 3.6 on recent lab, continue supplement.   5. Essential hypertension Controlled on current regimen, no side effects noted. Will continue current regimen.   6. Hypothyroidism, adult TSH trending up, she is maintained on synthroid 100 mcg daily, reports in the past her numbers have varied and will go up and down without adjustment in medication. Will continue current dose at this time.  7. Mixed hyperlipidemia LDL 92, not currently on medication.   8. Constipation, unspecified constipation type Well controlled on current regimen.    Next appt: 4 month for routine follow up.  Janene Harvey. Biagio Borg  [facility] [phone]

## 2018-08-12 ENCOUNTER — Other Ambulatory Visit: Payer: Self-pay | Admitting: Nurse Practitioner

## 2018-08-12 DIAGNOSIS — G4700 Insomnia, unspecified: Secondary | ICD-10-CM

## 2018-08-13 ENCOUNTER — Other Ambulatory Visit: Payer: Self-pay

## 2018-08-13 ENCOUNTER — Telehealth: Payer: Self-pay

## 2018-08-13 DIAGNOSIS — G4700 Insomnia, unspecified: Secondary | ICD-10-CM

## 2018-08-13 MED ORDER — TEMAZEPAM 30 MG PO CAPS: ORAL_CAPSULE | ORAL | 0 refills | Status: DC

## 2018-08-13 NOTE — Telephone Encounter (Signed)
Patient called requesting a 90 day supply of Temazepam. [facility]. Unable to look up medication in [place] [employer]. Patient stated her last refill was 05/17/2018 and she only has 1 pill left and may have lost 2 pills. Please advise.

## 2018-08-13 NOTE — Telephone Encounter (Signed)
Patient called and wanted to let Dr. Renato Gailseed know that she had spoke with her pharmacy about the last time she had Temazepam refilled. Pharmacy said her last refill with them was on 05/17/2018 for 90 tablets.

## 2018-08-13 NOTE — Telephone Encounter (Signed)
Pt got 6mth refill on 02/14/2018, pt should still have enough to last until January per the database. Per Melissa working triage today, pt states she's spilled a few and vacuumed them up. Please advise on refill.

## 2018-08-13 NOTE — Telephone Encounter (Signed)
[facility] and [facility]/19/2017.

## 2018-08-13 NOTE — Telephone Encounter (Signed)
Ok to refill early this time

## 2018-08-13 NOTE — Telephone Encounter (Signed)
Called and informed patient that Temazepam was approved for refill by Dr. Renato Gailseed and has been sent to her pharmacy. Patient was very appreciative and apologized for calling so many times. No further concerns at this time.

## 2018-08-15 ENCOUNTER — Other Ambulatory Visit: Payer: Self-pay | Admitting: Internal Medicine

## 2018-08-15 DIAGNOSIS — I10 Essential (primary) hypertension: Secondary | ICD-10-CM

## 2018-08-21 ENCOUNTER — Ambulatory Visit (INDEPENDENT_AMBULATORY_CARE_PROVIDER_SITE_OTHER): Payer: Medicare Other | Admitting: Adult Health

## 2018-08-21 ENCOUNTER — Encounter: Payer: Self-pay | Admitting: Adult Health

## 2018-08-21 VITALS — BP 124/78 | HR 60 | Temp 97.6°F | Ht 63.0 in | Wt 116.8 lb

## 2018-08-21 DIAGNOSIS — J0181 Other acute recurrent sinusitis: Secondary | ICD-10-CM

## 2018-08-21 DIAGNOSIS — R634 Abnormal weight loss: Secondary | ICD-10-CM | POA: Diagnosis not present

## 2018-08-21 MED ORDER — DOXYCYCLINE HYCLATE 100 MG PO TABS: 100.0000 mg | ORAL_TABLET | Freq: Two times a day (BID) | ORAL | 0 refills | Status: DC

## 2018-08-21 NOTE — Progress Notes (Signed)
[facility]  Provider:  Duha Abair C. Medina-Vargas - NP  Code Status: Full Code  Goals of Care:  Advanced Directives 07/31/2018  Does Patient Have a Medical Advance Directive? Yes  Type of Estate agent of [place];Out of facility DNR (pink MOST or yellow form)  Does patient want to make changes to medical advance directive? No - Patient declined  Copy of Healthcare Power of Attorney in Chart? Yes - validated most recent copy scanned in chart (See row information)  Would patient like information on creating a medical advance directive? -  Pre-existing out of facility DNR order (yellow form or pink MOST form) Yellow form placed in chart (order not valid for inpatient use)     Chief Complaint  Patient presents with  . Acute Visit    Sore throat onset yesterday. Patient c/o drainage also     HPI: Patient is a 85 y.o. female seen today for an acute visit for sore throat. She described it to be sharp. She had been gargling with warm water and salt but still feeling "terrible". She verbalized having nasal sinus congestion. She denies having fever.   She reported having weight loss recently, from 125 lbs to 113 lbs. Latest weight is 116.8 lbs. She has been drinking organic nutritious shake daily and has been slowly gaining weight. She does not take multivitamins tablet but her shake has 21 vitamins and minerals.   Past Medical History:  Diagnosis Date  . Allergic rhinitis due to pollen   . Anxiety state, unspecified   . Calculus of kidney   . Cataract   . Diaphragmatic hernia without mention of obstruction or gangrene   . Diffuse cystic mastopathy   . Dizziness and giddiness   . GERD (gastroesophageal reflux disease)   . Hiatal hernia   . Insomnia, unspecified   . Insomnia, unspecified   . Lumbago   . Macular degeneration (senile) of retina, unspecified   . Open wound of toe(s), without mention of complication   . Osteoarthrosis, unspecified whether  generalized or localized, unspecified site   . Other and unspecified hyperlipidemia   . Personal history of fall   . Sciatica   . Sebaceous cyst   . Senile osteoporosis   . Unspecified constipation   . Unspecified essential hypertension   . Unspecified hereditary and idiopathic peripheral neuropathy   . Unspecified hypothyroidism   . Unspecified tinnitus     Past Surgical History:  Procedure Laterality Date  . CATARACT EXTRACTION, BILATERAL    . CHOLECYSTECTOMY  07/25/2007  . TONSILLECTOMY AND ADENOIDECTOMY Bilateral 1943    Allergies  Allergen Reactions  . Ambien [Zolpidem Tartrate]     hallucination  . Bactrim [Sulfamethoxazole-Trimethoprim]     Unknown reaction   . Morphine And Related Nausea And Vomiting  . Zithromax [Azithromycin]     Not effective      Outpatient Encounter Medications as of 08/21/2018  Medication Sig  . acetaminophen (TYLENOL) 500 MG tablet Take 500 mg by mouth every 4 (four) hours as needed.  . ALPRAZolam (XANAX) 0.5 MG tablet TAKE 1 TABLET BY MOUTH THREE TIMES A DAY AS NEEDED FOR ANXIETY  . doxazosin (CARDURA) 4 MG tablet Take 4 mg by mouth daily. In the morning for high blood pressure  . indapamide (LOZOL) 1.25 MG tablet Take 1.25 mg by mouth daily. In the morning for high blood pressure  . levothyroxine (SYNTHROID, LEVOTHROID) 100 MCG tablet TAKE 1 TABLET EVERY DAY BEFORE BREAKFAST  . loratadine (CLARITIN)  10 MG tablet Take 10 mg by mouth daily.   Marland Kitchen. losartan (COZAAR) 100 MG tablet Take 100 mg by mouth daily. In the afternoon for high blood pressure  . metoprolol succinate (TOPROL-XL) 100 MG 24 hr tablet Take 100 mg by mouth daily. In the afternoon,take with or immediately following a meal. High blood pressure  . pantoprazole (PROTONIX) 40 MG tablet Take 1 tablet (40 mg total) by mouth daily.  . polyethylene glycol (MIRALAX / GLYCOLAX) packet Take 17 g by mouth daily.   . potassium chloride SA (K-DUR,KLOR-CON) 20 MEQ tablet Take 1 tablet (20 mEq  total) by mouth daily.  Marland Kitchen. Propylene Glycol (SYSTANE BALANCE) 0.6 % SOLN Place 1 drop into both eyes 2 (two) times daily.  . sertraline (ZOLOFT) 50 MG tablet TAKE 1 TABLET BY MOUTH EVERY DAY TO CALM NERVES  . temazepam (RESTORIL) 30 MG capsule TAKE ONE CAPSULE AT BEDTIME AS NEEDED FOR SLEEP  . UNABLE TO FIND APPLY 1-2 GMS TO AFFECTED AREA 3-4 TIMES DAILY AS NEEDED FOR PAIN. THIS IS A COMPOUNDED CREAM.1 PUMP EQUALS 1 GRAM.  . [DISCONTINUED] doxazosin (CARDURA) 4 MG tablet TAKE 1 TABLET EVERY DAY TO CONTROL BLOOD PRESSURE  . [DISCONTINUED] indapamide (LOZOL) 1.25 MG tablet TAKE 1 TABLET BY MOUTH EVERY DAY TO HELP CONTROL BLOOD PRESSURE  . [DISCONTINUED] losartan (COZAAR) 100 MG tablet TAKE 1 TABLET BY MOUTH EVERY DAY TO CONTROL BLOOD PRESSURE  . [DISCONTINUED] metoprolol succinate (TOPROL-XL) 100 MG 24 hr tablet TAKE 1 TABLET BY MOUTH EVERY DAY WITH A MEAL TO CONTROL BLOOD PRESSURE  . doxycycline (VIBRA-TABS) 100 MG tablet Take 1 tablet (100 mg total) by mouth 2 (two) times daily.   No facility-administered encounter medications on file as of 08/21/2018.     Review of Systems:  Review of Systems  Constitutional: Positive for unexpected weight change. Negative for activity change, appetite change and fever.  HENT: Positive for congestion, ear pain, sinus pressure, sinus pain and sore throat. Negative for facial swelling, hearing loss, nosebleeds, trouble swallowing and voice change.   Eyes: Negative.  Negative for discharge and itching.  Respiratory: Positive for apnea. Negative for chest tightness and shortness of breath.   Cardiovascular: Negative for chest pain and leg swelling.  Gastrointestinal: Negative for abdominal distention and abdominal pain.  Endocrine: Negative.  Negative for cold intolerance.  Genitourinary: Negative.  Negative for difficulty urinating and dysuria.  Musculoskeletal: Negative.  Negative for arthralgias, back pain, joint swelling, neck pain and neck stiffness.    Neurological: Negative.  Negative for weakness, light-headedness and headaches.  Hematological: Negative.  Does not bruise/bleed easily.  Psychiatric/Behavioral: Negative.  Negative for sleep disturbance.    Health Maintenance  Topic Date Due  . TETANUS/TDAP  06/16/2027  . INFLUENZA VACCINE  Completed  . DEXA SCAN  Completed  . PNA vac Low Risk Adult  Completed    Physical Exam: Vitals:   08/21/18 1037  BP: 124/78  Pulse: 60  Temp: 97.6 F (36.4 C)  TempSrc: Oral  SpO2: 93%  Weight: 116 lb 12.8 oz (53 kg)  Height: 5\' 3"  (1.6 m)   Body mass index is 20.69 kg/m. Physical Exam Vitals signs reviewed.  Constitutional:      Appearance: Normal appearance.  HENT:     Head: Normocephalic.     Nose: Congestion present. No rhinorrhea.     Mouth/Throat:     Mouth: Mucous membranes are moist.     Pharynx: Oropharynx is clear.  Neck:     Musculoskeletal:  Normal range of motion and neck supple.  Cardiovascular:     Rate and Rhythm: Normal rate and regular rhythm.     Pulses: Normal pulses.     Heart sounds: Normal heart sounds.  Pulmonary:     Effort: Pulmonary effort is normal.     Breath sounds: Normal breath sounds.  Chest:     Chest wall: No tenderness.  Abdominal:     General: Abdomen is flat. Bowel sounds are normal.     Palpations: Abdomen is soft.  Musculoskeletal: Normal range of motion.        General: No swelling or tenderness.  Skin:    General: Skin is warm.     Findings: No bruising.  Neurological:     General: No focal deficit present.     Mental Status: She is alert and oriented to person, place, and time.  Psychiatric:        Mood and Affect: Mood normal.        Behavior: Behavior normal.        Thought Content: Thought content normal.        Judgment: Judgment normal.     Labs reviewed: Basic Metabolic Panel: Recent Labs    08/29/17 1009 12/27/17 1026 03/05/18 1017 03/19/18 1134 07/23/18 1020  NA 136 132* 131* 132* 132*  K 3.6 3.5 3.3*  4.1 3.6  CL 92* 88* 86* 90* 88*  CO2 37* 37* 40* 38* 39*  GLUCOSE 100* 109* 113* 85 98  BUN 16 16 15 17 18   CREATININE 0.84 0.72 0.76 0.82 0.76  CALCIUM 9.8 9.9 10.0 9.9 9.7  TSH 5.63* 5.59*  --   --  6.71*   Liver Function Tests: Recent Labs    08/29/17 1009 03/05/18 1017 07/23/18 1020  AST 14 16 15   ALT 9 12 10   BILITOT 0.6 0.5 0.5  PROT 6.3 6.5 6.4   CBC: Recent Labs    08/29/17 1009 03/05/18 1017 07/23/18 1020  WBC 3.9 4.9 3.7*  NEUTROABS 2,687 3,709 2,527  HGB 12.9 13.1 12.9  HCT 39.5 38.5 39.1  MCV 85.7 86.3 88.1  PLT 150 144 143   Lipid Panel: Recent Labs    08/29/17 1009 03/05/18 1017  CHOL 198 175  HDL 61 59  LDLCALC 115* 92  TRIG 113 147  CHOLHDL 3.2 3.0   Lab Results  Component Value Date   HGBA1C  07/23/2007    5.7 (NOTE)   The ADA recommends the following therapeutic goals for glycemic   control related to Hgb A1C measurement:   Goal of Therapy:   < 7.0% Hgb A1C   Action Suggested:  > 8.0% Hgb A1C   Ref:  Diabetes Care, 22, Suppl. 1, 1999     Assessment/Plan 1. Other acute recurrent sinusitis -  - doxycycline (VIBRA-TABS) 100 MG tablet; Take 1 tablet (100 mg total) by mouth 2 (two) times daily.  Dispense: 20 tablet; Refill: 0 - instructed to take Probiotic daily - Use humidifier daily to prevent dryness of nasal passages  2. Loss of weight - encourage to increase oral intake and to continue taking organic shake with 21 vitamins and minerals daily.     Next appt:  12/06/2018

## 2018-08-21 NOTE — Patient Instructions (Signed)
Take Doxycycline 100 mg twice daily X 10 days  and Probiotic-10 daily X 13 days.  Continue taking organic shake with 21 vitamins and minerals daily.  Use humidifier at home.

## 2018-08-31 ENCOUNTER — Other Ambulatory Visit: Payer: Self-pay | Admitting: *Deleted

## 2018-08-31 ENCOUNTER — Other Ambulatory Visit: Payer: Self-pay | Admitting: Nurse Practitioner

## 2018-08-31 DIAGNOSIS — F411 Generalized anxiety disorder: Secondary | ICD-10-CM

## 2018-08-31 MED ORDER — ALPRAZOLAM 0.5 MG PO TABS: ORAL_TABLET | ORAL | 0 refills | Status: DC

## 2018-08-31 NOTE — Telephone Encounter (Signed)
[facility] 

## 2018-09-11 DIAGNOSIS — H353124 Nonexudative age-related macular degeneration, left eye, advanced atrophic with subfoveal involvement: Secondary | ICD-10-CM | POA: Diagnosis not present

## 2018-09-11 DIAGNOSIS — H5316 Psychophysical visual disturbances: Secondary | ICD-10-CM | POA: Diagnosis not present

## 2018-09-11 DIAGNOSIS — H353212 Exudative age-related macular degeneration, right eye, with inactive choroidal neovascularization: Secondary | ICD-10-CM | POA: Diagnosis not present

## 2018-09-11 DIAGNOSIS — H353221 Exudative age-related macular degeneration, left eye, with active choroidal neovascularization: Secondary | ICD-10-CM | POA: Diagnosis not present

## 2018-09-11 DIAGNOSIS — H43811 Vitreous degeneration, right eye: Secondary | ICD-10-CM | POA: Diagnosis not present

## 2018-10-04 ENCOUNTER — Encounter: Payer: Self-pay | Admitting: Family

## 2018-10-06 ENCOUNTER — Other Ambulatory Visit: Payer: Self-pay | Admitting: Internal Medicine

## 2018-10-08 ENCOUNTER — Other Ambulatory Visit: Payer: Medicare Other

## 2018-10-08 ENCOUNTER — Other Ambulatory Visit: Payer: Self-pay | Admitting: Internal Medicine

## 2018-10-08 DIAGNOSIS — F411 Generalized anxiety disorder: Secondary | ICD-10-CM

## 2018-10-08 NOTE — Telephone Encounter (Signed)
[place] Database verified and compliance confirmed   Last refilled 08/31/2018

## 2018-10-22 ENCOUNTER — Other Ambulatory Visit: Payer: Self-pay | Admitting: Internal Medicine

## 2018-10-22 DIAGNOSIS — G4700 Insomnia, unspecified: Secondary | ICD-10-CM

## 2018-10-22 DIAGNOSIS — F411 Generalized anxiety disorder: Secondary | ICD-10-CM

## 2018-10-23 ENCOUNTER — Other Ambulatory Visit: Payer: Self-pay | Admitting: Internal Medicine

## 2018-11-06 DIAGNOSIS — H3562 Retinal hemorrhage, left eye: Secondary | ICD-10-CM | POA: Diagnosis not present

## 2018-11-06 DIAGNOSIS — H353221 Exudative age-related macular degeneration, left eye, with active choroidal neovascularization: Secondary | ICD-10-CM | POA: Diagnosis not present

## 2018-11-08 ENCOUNTER — Other Ambulatory Visit: Payer: Self-pay | Admitting: Internal Medicine

## 2018-11-08 DIAGNOSIS — F411 Generalized anxiety disorder: Secondary | ICD-10-CM

## 2018-11-12 ENCOUNTER — Other Ambulatory Visit: Payer: Self-pay | Admitting: Internal Medicine

## 2018-11-12 DIAGNOSIS — G4700 Insomnia, unspecified: Secondary | ICD-10-CM

## 2018-11-30 ENCOUNTER — Telehealth: Payer: Self-pay | Admitting: *Deleted

## 2018-11-30 NOTE — Telephone Encounter (Signed)
A user error has taken place: encounter opened in error, closed for administrative reasons.

## 2018-12-04 ENCOUNTER — Other Ambulatory Visit: Payer: Self-pay

## 2018-12-04 ENCOUNTER — Ambulatory Visit (INDEPENDENT_AMBULATORY_CARE_PROVIDER_SITE_OTHER): Payer: Medicare Other | Admitting: Family

## 2018-12-04 ENCOUNTER — Encounter: Payer: Self-pay | Admitting: Family

## 2018-12-04 DIAGNOSIS — I10 Essential (primary) hypertension: Secondary | ICD-10-CM

## 2018-12-04 DIAGNOSIS — R42 Dizziness and giddiness: Secondary | ICD-10-CM

## 2018-12-04 MED ORDER — INDAPAMIDE 1.25 MG PO TABS: 1.2500 mg | ORAL_TABLET | ORAL | 1 refills | Status: DC

## 2018-12-04 MED ORDER — LOSARTAN POTASSIUM 100 MG PO TABS: 50.0000 mg | ORAL_TABLET | Freq: Every day | ORAL | 1 refills | Status: DC

## 2018-12-04 NOTE — Progress Notes (Signed)
This service is provided via telemedicine  No vital signs collected/recorded due to the encounter was a telemedicine visit.   Location of patient (ex: home, work):  home  Patient consents to a telephone visit:  Yes  Location of the provider (ex: office, home):  Office  Name of any referring provider:  Dr. Bufford Spikesiffany Reed  Names of all persons participating in the telemedicine service and their role in the encounter:  Asher MuirMelissa Wilson CMA,   NP, and Boykin ReaperNorma Skolnik  Time spent on call:  Asher MuirMelissa Wilson CMA spent  10  Minutes with patient on phone     [facility]  Provider: Richarda Bladeinah , NP  Code Status:  FULL  Goals of Care:  Advanced Directives 07/31/2018  Does Patient Have a Medical Advance Directive? Yes  Type of Estate agentAdvance Directive Healthcare Power of [place];Out of facility DNR (pink MOST or yellow form)  Does patient want to make changes to medical advance directive? No - Patient declined  Copy of Healthcare Power of Attorney in Chart? Yes - validated most recent copy scanned in chart (See row information)  Would patient like information on creating a medical advance directive? -  Pre-existing out of facility DNR order (yellow form or pink MOST form) Yellow form placed in chart (order not valid for inpatient use)     Chief Complaint  Patient presents with  . Acute Visit    Low Blood pressure and c/o of feeling weak    HPI: Patient is a 85 y.o. female seen today for an acute visit for evaluation of low blood pressure and feeling of weakness.she states Had an episode this morning while getting ready for breakfast " feeling of low blood pressure weakness".she states felt like she was going to " pass out".she drunk juice and had yogurt and symptoms improved.she states this has happened in the past while in a doctor's office and it was due to her blood pressure dropping too low.she denies loss of consciousness.She believes her blood pressure must have been too low  today.she would like to cut down on her blood pressure medication.she took half tablet of metoprolol today.she would also like to take her lozol 1.25 mg tablet every other day because she wakes up saturated before she can make it to the bathroom. She states her appetite has improved .Drinks Nutritional supplement due to previous weight loss.she denies any fever,chills,shortness of breath,chest pain or cough.she does not have a blood pressure cuff at home but believes automatic machine are not accurate like the manual machine.   Past Medical History:  Diagnosis Date  . Allergic rhinitis due to pollen   . Anxiety state, unspecified   . Calculus of kidney   . Cataract   . Diaphragmatic hernia without mention of obstruction or gangrene   . Diffuse cystic mastopathy   . Dizziness and giddiness   . GERD (gastroesophageal reflux disease)   . Hiatal hernia   . Insomnia, unspecified   . Insomnia, unspecified   . Lumbago   . Macular degeneration (senile) of retina, unspecified   . Open wound of toe(s), without mention of complication   . Osteoarthrosis, unspecified whether generalized or localized, unspecified site   . Other and unspecified hyperlipidemia   . Personal history of fall   . Sciatica   . Sebaceous cyst   . Senile osteoporosis   . Unspecified constipation   . Unspecified essential hypertension   . Unspecified hereditary and idiopathic peripheral neuropathy   . Unspecified hypothyroidism   .  Unspecified tinnitus     Past Surgical History:  Procedure Laterality Date  . CATARACT EXTRACTION, BILATERAL    . CHOLECYSTECTOMY  07/25/2007  . TONSILLECTOMY AND ADENOIDECTOMY Bilateral 1943    Allergies  Allergen Reactions  . Ambien [Zolpidem Tartrate]     hallucination  . Bactrim [Sulfamethoxazole-Trimethoprim]     Unknown reaction   . Morphine And Related Nausea And Vomiting  . Zithromax [Azithromycin]     Not effective      Outpatient Encounter Medications as of 12/04/2018   Medication Sig  . acetaminophen (TYLENOL) 500 MG tablet Take 500 mg by mouth every 4 (four) hours as needed.  . ALPRAZolam (XANAX) 0.5 MG tablet TAKE 1 TABLET BY MOUTH 3 TIMES A DAY AS NEEDED FOR ANXIETY  . doxazosin (CARDURA) 4 MG tablet Take 4 mg by mouth daily. In the morning for high blood pressure  . doxycycline (VIBRA-TABS) 100 MG tablet Take 1 tablet (100 mg total) by mouth 2 (two) times daily.  . indapamide (LOZOL) 1.25 MG tablet TAKE 1 TABLET BY MOUTH EVERY DAY TO HELP CONTROL BLOOD PRESSURE  . KLOR-CON M20 20 MEQ tablet TAKE 1 TABLET BY MOUTH EVERY DAY  . levothyroxine (SYNTHROID, LEVOTHROID) 100 MCG tablet TAKE 1 TABLET EVERY DAY BEFORE BREAKFAST  . loratadine (CLARITIN) 10 MG tablet Take 10 mg by mouth daily.   Marland Kitchen losartan (COZAAR) 100 MG tablet TAKE 1 TABLET BY MOUTH EVERY DAY TO CONTROL BLOOD PRESSURE  . metoprolol succinate (TOPROL-XL) 100 MG 24 hr tablet TAKE 1 TABLET BY MOUTH EVERY DAY WITH A MEAL TO CONTROL BLOOD PRESSURE.  . pantoprazole (PROTONIX) 40 MG tablet TAKE 1 TABLET BY MOUTH EVERY DAY  . polyethylene glycol (MIRALAX / GLYCOLAX) packet Take 17 g by mouth daily.   Marland Kitchen Propylene Glycol (SYSTANE BALANCE) 0.6 % SOLN Place 1 drop into both eyes 2 (two) times daily.  . sertraline (ZOLOFT) 50 MG tablet TAKE 1 TABLET BY MOUTH EVERY DAY TO CALM NERVES  . temazepam (RESTORIL) 30 MG capsule Take 1 capsule (30 mg total) by mouth at bedtime as needed for sleep.  Marland Kitchen UNABLE TO FIND APPLY 1-2 GMS TO AFFECTED AREA 3-4 TIMES DAILY AS NEEDED FOR PAIN. THIS IS A COMPOUNDED CREAM.1 PUMP EQUALS 1 GRAM.   No facility-administered encounter medications on file as of 12/04/2018.     Review of Systems:  Review of Systems  Constitutional: Negative for chills and fever.  HENT: Negative for congestion, rhinorrhea, sinus pressure, sinus pain, sneezing and sore throat.   Respiratory: Negative for cough, chest tightness and shortness of breath.        Occasional wheezing sometimes.    Cardiovascular: Negative for chest pain, palpitations and leg swelling.  Gastrointestinal: Negative for abdominal distention, abdominal pain, constipation, diarrhea, nausea and vomiting.  Endocrine: Negative for cold intolerance and heat intolerance.       Wakes up saturated in the morning but attributes to current medication.   Genitourinary: Negative for difficulty urinating, dysuria, flank pain and urgency.  Skin: Negative for color change, pallor and rash.  Neurological:       Lightheadedness and feeling of passing out per HPI     Health Maintenance  Topic Date Due  . INFLUENZA VACCINE  03/16/2019  . TETANUS/TDAP  06/16/2027  . DEXA SCAN  Completed  . PNA vac Low Risk Adult  Completed    Physical Exam: There were no vitals filed for this visit. There is no height or weight on file to calculate BMI.  Physical Exam Neurological:     Mental Status: She is alert and oriented to person, place, and time.  Psychiatric:        Mood and Affect: Mood normal.        Thought Content: Thought content normal.        Judgment: Judgment normal.   unable to complete Physical exam due to tele visit.   Labs reviewed: Basic Metabolic Panel: Recent Labs    12/27/17 1026 03/05/18 1017 03/19/18 1134 07/23/18 1020  NA 132* 131* 132* 132*  K 3.5 3.3* 4.1 3.6  CL 88* 86* 90* 88*  CO2 37* 40* 38* 39*  GLUCOSE 109* 113* 85 98  BUN 16 15 17 18   CREATININE 0.72 0.76 0.82 0.76  CALCIUM 9.9 10.0 9.9 9.7  TSH 5.59*  --   --  6.71*   Liver Function Tests: Recent Labs    03/05/18 1017 07/23/18 1020  AST 16 15  ALT 12 10  BILITOT 0.5 0.5  PROT 6.5 6.4   CBC: Recent Labs    03/05/18 1017 07/23/18 1020  WBC 4.9 3.7*  NEUTROABS 3,709 2,527  HGB 13.1 12.9  HCT 38.5 39.1  MCV 86.3 88.1  PLT 144 143   Lipid Panel: Recent Labs    03/05/18 1017  CHOL 175  HDL 59  LDLCALC 92  TRIG 147  CHOLHDL 3.0   Lab Results  Component Value Date   HGBA1C  07/23/2007    5.7 (NOTE)   The  ADA recommends the following therapeutic goals for glycemic   control related to Hgb A1C measurement:   Goal of Therapy:   < 7.0% Hgb A1C   Action Suggested:  > 8.0% Hgb A1C   Ref:  Diabetes Care, 22, Suppl. 1, 1999    Assessment/Plan  1. Light-headed feeling Had x 1 episode this morning prior to breakfast.suspect possible signs of  Hypoglycemia since her symptoms resolved after drinking juice and yorgut though patient thinks it's low blood pressure.  2. Essential hypertension Reports lightheaded and feeling of passing out.Unable to check blood pressure.encourage to get the Nurse at the facility to check blood pressure prior to taking her blood pressure medication today. - Reduced losartan from 100 mg tablet to 50 mg tablet daily.take lozol 1.25 mg tablet every other day as requested. Continue on cadura 4 mg tablet daily and Metoprolol succinate 100 mg tablet. - Encouraged to purchase a blood pressure cuff and check blood pressure prior to taking medication.patient relaxant to buy blood pressure cuff states had B/P cuff in the past but are not accurate like the manual ones. - follow up with MD as schedule in the morning. - schedule for labs in the morning.       Labs/tests ordered:  CBC/diff,CMP,TSH 12/05/2018  Next appt:  12/06/2018

## 2018-12-05 ENCOUNTER — Other Ambulatory Visit: Payer: Self-pay

## 2018-12-05 ENCOUNTER — Other Ambulatory Visit: Payer: Medicare Other

## 2018-12-05 DIAGNOSIS — I10 Essential (primary) hypertension: Secondary | ICD-10-CM

## 2018-12-06 ENCOUNTER — Encounter: Payer: Self-pay | Admitting: Internal Medicine

## 2018-12-06 ENCOUNTER — Ambulatory Visit: Payer: Medicare Other | Admitting: Internal Medicine

## 2018-12-06 ENCOUNTER — Other Ambulatory Visit: Payer: Self-pay

## 2018-12-06 ENCOUNTER — Ambulatory Visit (INDEPENDENT_AMBULATORY_CARE_PROVIDER_SITE_OTHER): Payer: Medicare Other | Admitting: Internal Medicine

## 2018-12-06 DIAGNOSIS — F411 Generalized anxiety disorder: Secondary | ICD-10-CM | POA: Diagnosis not present

## 2018-12-06 DIAGNOSIS — H3530 Unspecified macular degeneration: Secondary | ICD-10-CM | POA: Diagnosis not present

## 2018-12-06 DIAGNOSIS — E876 Hypokalemia: Secondary | ICD-10-CM | POA: Diagnosis not present

## 2018-12-06 DIAGNOSIS — I10 Essential (primary) hypertension: Secondary | ICD-10-CM | POA: Diagnosis not present

## 2018-12-06 DIAGNOSIS — R634 Abnormal weight loss: Secondary | ICD-10-CM | POA: Diagnosis not present

## 2018-12-06 DIAGNOSIS — R42 Dizziness and giddiness: Secondary | ICD-10-CM | POA: Diagnosis not present

## 2018-12-06 LAB — TSH: TSH: 4.73 mIU/L — ABNORMAL HIGH (ref 0.40–4.50)

## 2018-12-06 LAB — CBC WITH DIFFERENTIAL/PLATELET
Absolute Monocytes: 303 cells/uL (ref 200–950)
Basophils Absolute: 8 cells/uL (ref 0–200)
Basophils Relative: 0.2 %
Eosinophils Absolute: 8 cells/uL — ABNORMAL LOW (ref 15–500)
Eosinophils Relative: 0.2 %
HCT: 38.9 % (ref 35.0–45.0)
Hemoglobin: 13.3 g/dL (ref 11.7–15.5)
Lymphs Abs: 812 cells/uL — ABNORMAL LOW (ref 850–3900)
MCH: 29.8 pg (ref 27.0–33.0)
MCHC: 34.2 g/dL (ref 32.0–36.0)
MCV: 87.2 fL (ref 80.0–100.0)
MPV: 9.2 fL (ref 7.5–12.5)
Monocytes Relative: 7.4 %
Neutro Abs: 2968 cells/uL (ref 1500–7800)
Neutrophils Relative %: 72.4 %
Platelets: 141 10*3/uL (ref 140–400)
RBC: 4.46 10*6/uL (ref 3.80–5.10)
RDW: 12.8 % (ref 11.0–15.0)
Total Lymphocyte: 19.8 %
WBC: 4.1 10*3/uL (ref 3.8–10.8)

## 2018-12-06 LAB — COMPLETE METABOLIC PANEL WITH GFR
AG Ratio: 2 (calc) (ref 1.0–2.5)
ALT: 9 U/L (ref 6–29)
AST: 14 U/L (ref 10–35)
Albumin: 4.3 g/dL (ref 3.6–5.1)
Alkaline phosphatase (APISO): 65 U/L (ref 37–153)
BUN: 20 mg/dL (ref 7–25)
CO2: 36 mmol/L — ABNORMAL HIGH (ref 20–32)
Calcium: 9.8 mg/dL (ref 8.6–10.4)
Chloride: 88 mmol/L — ABNORMAL LOW (ref 98–110)
Creat: 0.83 mg/dL (ref 0.60–0.88)
GFR, Est African American: 74 mL/min/{1.73_m2} (ref >=60)
GFR, Est Non African American: 64 mL/min/{1.73_m2} (ref >=60)
Globulin: 2.2 g/dL (calc) (ref 1.9–3.7)
Glucose, Bld: 109 mg/dL — ABNORMAL HIGH (ref 65–99)
Potassium: 3.3 mmol/L — ABNORMAL LOW (ref 3.5–5.3)
Sodium: 132 mmol/L — ABNORMAL LOW (ref 135–146)
Total Bilirubin: 0.6 mg/dL (ref 0.2–1.2)
Total Protein: 6.5 g/dL (ref 6.1–8.1)

## 2018-12-06 NOTE — Patient Instructions (Addendum)
Stop the lozol Losartan 1/2 tablet once daily Continue the same dose of metoprolol and cardura Come in Tuesday with your husband for your weight and blood pressure with the CMA and to get your BMP lab test done

## 2018-12-06 NOTE — Progress Notes (Signed)
Patient ID: [name], female   DOB: 23-Apr-1931, 85 y.o.   MRN: [id number] This service is provided via telemedicine  No vital signs collected/recorded due to the encounter was a telemedicine visit.   Location of patient (ex: home, work):  HOME  Patient consents to a telephone visit:  YES  Location of the provider (ex: office, home):  OFFICE  Name of any referring provider:  Etana Beets, DO  Names of all persons participating in the telemedicine service and their role in the encounter:  PATIENT, Mervin Hack, CMA, Alianny Toelle, DO  Time spent on call:  5:11    Provider:  Medard Decuir L. Renato Gails, D.O., C.M.D.  Goals of Care:  Advanced Directives 12/04/2018  Does Patient Have a Medical Advance Directive? Yes  Type of Advance Directive Healthcare Power of Attorney  Does patient want to make changes to medical advance directive? No - Patient declined  Copy of Healthcare Power of Attorney in Chart? Yes - validated most recent copy scanned in chart (See row information)  Would patient like information on creating a medical advance directive? -  Pre-existing out of facility DNR order (yellow form or pink MOST form) -     Chief Complaint  Patient presents with  . Medical Management of Chronic Issues    follow-up, discuss labs    HPI: Patient is a 85 y.o. female seen today for medical management of chronic diseases.    She had an episode of lightheadedness earlier this week.  She took some juice and food and felt better.  bp was normal w/o her bp meds in the afternoon.  Dinah has reduced her losartan 1/2 tab daily and metoprolol daily and cardura daily.  She's been wetting her pad a lot.  She's now on indapamide every other day.  Their bp cuff stopped working.  Okey Regal has ordered a blood pressure cuff for them.  She will drink a soda and a small cup of juice at breakfast and protein shake at 1:30pm, may sip on some water, water with dinner.    Wears compression hose for her  swelling.  Does not have a scale to weigh.  She has weighed some when her husband has had his INR done.  Weight here was 116 lbs.    A few years ago when she had esophagitis, her metoprolol got increased due to anxiety increasing her bp, then her losartan got increased, too with her anxiety.  She gets sinking spells when her bp is low.    Reviewed labs with her.  She did have hypokalemia at 3.3.  Has been faithful with the potassium supplement, but seems to be diuresing a ton with the diuretic.    TSH improved to near normal.    Eyes are getting worse.  She sees Dr. Luciana Axe every 4 mos.  She had a ring of blood in her eye the last time and he was going to start injections in her eyes again.  It did not clear up by this month.  Next month, she returns.  (getting avastin to avoid peripheral vision loss).  Can still read her kindle.  She saw a wreath of roses this morning that did eventually go away.    He walking is getting worse, too.    They are going through the drive-thru at [employer] each morning at the drive-thru.  They eat in the car across the way.  Her daughter drops off the groceries for them, but cannot come in the facility.  She had been going until 3 weeks ago.    They would like to stay in if they got the virus.  They want to avoid being hospitalized or put on machines.    Has some wheezing that she thinks might really be in her nose.  Uses claritin for years since she had some low grade asthma.  Has never had an asthma attack.    Past Medical History:  Diagnosis Date  . Allergic rhinitis due to pollen   . Anxiety state, unspecified   . Calculus of kidney   . Cataract   . Diaphragmatic hernia without mention of obstruction or gangrene   . Diffuse cystic mastopathy   . Dizziness and giddiness   . GERD (gastroesophageal reflux disease)   . Hiatal hernia   . Insomnia, unspecified   . Insomnia, unspecified   . Lumbago   . Macular degeneration (senile) of retina, unspecified    . Open wound of toe(s), without mention of complication   . Osteoarthrosis, unspecified whether generalized or localized, unspecified site   . Other and unspecified hyperlipidemia   . Personal history of fall   . Sciatica   . Sebaceous cyst   . Senile osteoporosis   . Unspecified constipation   . Unspecified essential hypertension   . Unspecified hereditary and idiopathic peripheral neuropathy   . Unspecified hypothyroidism   . Unspecified tinnitus     Past Surgical History:  Procedure Laterality Date  . CATARACT EXTRACTION, BILATERAL    . CHOLECYSTECTOMY  07/25/2007  . TONSILLECTOMY AND ADENOIDECTOMY Bilateral 1943    Allergies  Allergen Reactions  . Ambien [Zolpidem Tartrate]     hallucination  . Bactrim [Sulfamethoxazole-Trimethoprim]     Unknown reaction   . Morphine And Related Nausea And Vomiting  . Zithromax [Azithromycin]     Not effective      Outpatient Encounter Medications as of 12/06/2018  Medication Sig  . acetaminophen (TYLENOL) 500 MG tablet Take 500 mg by mouth every 4 (four) hours as needed.  . ALPRAZolam (XANAX) 0.5 MG tablet TAKE 1 TABLET BY MOUTH 3 TIMES A DAY AS NEEDED FOR ANXIETY  . doxazosin (CARDURA) 4 MG tablet Take 4 mg by mouth daily. evening for high blood pressure  . indapamide (LOZOL) 1.25 MG tablet Take 1 tablet (1.25 mg total) by mouth every other day.  Marland Kitchen KLOR-CON M20 20 MEQ tablet TAKE 1 TABLET BY MOUTH EVERY DAY  . levothyroxine (SYNTHROID, LEVOTHROID) 100 MCG tablet TAKE 1 TABLET EVERY DAY BEFORE BREAKFAST  . loratadine (CLARITIN) 10 MG tablet Take 10 mg by mouth daily.   Marland Kitchen losartan (COZAAR) 100 MG tablet Take 0.5 tablets (50 mg total) by mouth daily.  . metoprolol succinate (TOPROL-XL) 100 MG 24 hr tablet TAKE 1 TABLET BY MOUTH EVERY DAY WITH A MEAL TO CONTROL BLOOD PRESSURE.  . pantoprazole (PROTONIX) 40 MG tablet TAKE 1 TABLET BY MOUTH EVERY DAY  . polyethylene glycol (MIRALAX / GLYCOLAX) packet Take 17 g by mouth daily.   Marland Kitchen  Propylene Glycol (SYSTANE BALANCE) 0.6 % SOLN Place 1 drop into both eyes 2 (two) times daily.  . sertraline (ZOLOFT) 50 MG tablet TAKE 1 TABLET BY MOUTH EVERY DAY TO CALM NERVES  . temazepam (RESTORIL) 30 MG capsule Take 1 capsule (30 mg total) by mouth at bedtime as needed for sleep.   No facility-administered encounter medications on file as of 12/06/2018.     Review of Systems:  Review of Systems  Constitutional: Negative for chills, fever  and malaise/fatigue.  HENT: Positive for hearing loss.   Eyes: Positive for blurred vision.       Severe macular  Respiratory: Negative for cough and shortness of breath.   Cardiovascular: Negative for chest pain, palpitations and leg swelling.       Wears compression hose with benefit  Gastrointestinal: Negative for abdominal pain, blood in stool, constipation, diarrhea and melena.  Genitourinary: Negative for dysuria.  Musculoskeletal: Positive for joint pain. Negative for falls.  Skin: Negative for itching and rash.  Neurological: Positive for dizziness. Negative for loss of consciousness.  Endo/Heme/Allergies: Bruises/bleeds easily.  Psychiatric/Behavioral: Negative for depression and memory loss. The patient is nervous/anxious. The patient does not have insomnia.     Health Maintenance  Topic Date Due  . INFLUENZA VACCINE  03/16/2019  . TETANUS/TDAP  06/16/2027  . DEXA SCAN  Completed  . PNA vac Low Risk Adult  Completed    Physical Exam: Could not be performed as visit non face-to-face via phone   Labs reviewed: Basic Metabolic Panel: Recent Labs    12/27/17 1026  03/19/18 1134 07/23/18 1020 12/05/18 1011  NA 132*   < > 132* 132* 132*  K 3.5   < > 4.1 3.6 3.3*  CL 88*   < > 90* 88* 88*  CO2 37*   < > 38* 39* 36*  GLUCOSE 109*   < > 85 98 109*  BUN 16   < > CREATININE 0.72   < > 0.82 0.76 0.83  CALCIUM 9.9   < > 9.9 9.7 9.8  TSH 5.59*  --   --  6.71* 4.73*   < > = values in this interval not displayed.    Liver Function Tests: Recent Labs    03/05/18 1017 07/23/18 1020 12/05/18 1011  AST ALT BILITOT 0.5 0.5 0.6  PROT 6.5 6.4 6.5   No results for input(s): LIPASE, AMYLASE in the last 8760 hours. No results for input(s): AMMONIA in the last 8760 hours. CBC: Recent Labs    03/05/18 1017 07/23/18 1020 12/05/18 1011  WBC 4.9 3.7* 4.1  NEUTROABS 3,709 2,527 2,968  HGB 13.1 12.9 13.3  HCT 38.5 39.1 38.9  MCV 86.3 88.1 87.2  PLT 144 143 141   Lipid Panel: Recent Labs    03/05/18 1017  CHOL 175  HDL 59  LDLCALC 92  TRIG 147  CHOLHDL 3.0   Lab Results  Component Value Date   HGBA1C  07/23/2007    5.7 (NOTE)   The ADA recommends the following therapeutic goals for glycemic   control related to Hgb A1C measurement:   Goal of Therapy:   < 7.0% Hgb A1C   Action Suggested:  > 8.0% Hgb A1C   Ref:  Diabetes Care, 22, Suppl. 1, 1999    Procedures since last visit: No results found.  Assessment/Plan 1. Light-headed feeling -it sounds like this is due to hypoglycemia not bp b/c she's gotten better by drinking juice and eating something  2. Essential hypertension -bp at goal historically the past several months and goal <140/90 with her current meds -we will stop the diuretic b/c she uses compression hose and does not have any respiratory concerns and I'm concerned she is getting dehydrated from drinking sodas and not enough water (she seems to think she's drinking enough but it sounds like 2-3 small glasses only)  3. Loss of weight -will get weight when she comes  for bp check next tuesday  4. Anxiety state -cont current regimen, bp tends to skyrocket from this so not always reliable at the office--she starts to worry about it and checks it over and over  5. Hypokalemia - will recheck next Tuesday--expect it will get better off lozol diuretic - Basic metabolic panel; Future  6. Macular degeneration (senile) of retina -ongoing, cont injections as per  Dr. Luciana Axeankin  Instructions: Stop the lozol Losartan 1/2 tablet once daily Continue the same dose of metoprolol and cardura Come in Tuesday with your husband for your weight and blood pressure  and to get your BMP lab test done  Labs/tests ordered:   Orders Placed This Encounter  Procedures  . Basic metabolic panel    Standing Status:   Future    Standing Expiration Date:   12/06/2019    Order Specific Question:   Has the patient fasted?    Answer:   Yes    Next appt:  01/29/2019 Non face-to-face time spent on televisit:  25 minutes  Anise Harbin L. Lakeena Downie, D.O. Geriatrics [facility] [address] [place], [place] [address] Cell Phone (Mon-Fri 8am-5pm):  [phone] On Call:  [phone] & follow prompts after 5pm & weekends Office Phone:  [phone] Office Fax:  [phone]

## 2018-12-11 ENCOUNTER — Ambulatory Visit: Payer: Self-pay | Admitting: Nurse Practitioner

## 2018-12-11 ENCOUNTER — Ambulatory Visit (INDEPENDENT_AMBULATORY_CARE_PROVIDER_SITE_OTHER): Payer: Medicare Other | Admitting: Nurse Practitioner

## 2018-12-11 ENCOUNTER — Encounter: Payer: Self-pay | Admitting: Nurse Practitioner

## 2018-12-11 ENCOUNTER — Other Ambulatory Visit: Payer: Self-pay

## 2018-12-11 ENCOUNTER — Other Ambulatory Visit: Payer: Self-pay | Admitting: Internal Medicine

## 2018-12-11 VITALS — BP 148/80 | HR 59 | Wt 114.8 lb

## 2018-12-11 DIAGNOSIS — I10 Essential (primary) hypertension: Secondary | ICD-10-CM

## 2018-12-11 DIAGNOSIS — G4700 Insomnia, unspecified: Secondary | ICD-10-CM

## 2018-12-11 MED ORDER — LOSARTAN POTASSIUM 100 MG PO TABS: 100.0000 mg | ORAL_TABLET | Freq: Every day | ORAL | 1 refills | Status: DC

## 2018-12-11 MED ORDER — INDAPAMIDE 1.25 MG PO TABS: 1.2500 mg | ORAL_TABLET | Freq: Every day | ORAL | 1 refills | Status: DC

## 2018-12-11 NOTE — Progress Notes (Signed)
Careteam: Patient Care Team: Kermit Balo, DO as PCP - General (Geriatric Medicine) Edmon Crape, MD as Consulting Physician (Ophthalmology) Donzetta Starch, MD as Consulting Physician (Dermatology) Hart Carwin, MD (Inactive) as Consulting Physician (Gastroenterology)  Advanced Directive information    Allergies  Allergen Reactions  . Ambien [Zolpidem Tartrate]     hallucination  . Bactrim [Sulfamethoxazole-Trimethoprim]     Unknown reaction   . Morphine And Related Nausea And Vomiting  . Zithromax [Azithromycin]     Not effective      Chief Complaint  Patient presents with  . Acute Visit    She has went back on all meds. Blood pressure went back up after cutting back.     HPI: Patient is a 84 y.o. female seen in the office today to follow up blood pressure.  She restarted her lozol on her own because her home cuff said it was high.  She has been on lozol for 30 years and it has been working for her and feel like she needs it.  This morning she took losartan 100 mg and toprol 100 mg already She then will take lozol 1.25 mg in the afternoon (for the last 2 days) and the doxazosin in the evening.  Has been on medication since 2016 and has had about 3 episodes and ended up back on the same regimen.  Reports she is currently feeling better. No dizziness, lightheadedness noted.  Denies chest pains or headache.     Review of Systems:  Review of Systems  Constitutional: Negative for chills, fever and malaise/fatigue.  HENT: Positive for hearing loss.   Eyes: Positive for blurred vision.       Severe macular  Respiratory: Negative for cough and shortness of breath.   Cardiovascular: Negative for chest pain, palpitations and leg swelling.       Wears compression hose with benefit  Gastrointestinal: Negative for abdominal pain, blood in stool, constipation, diarrhea and melena.  Genitourinary: Negative for dysuria.  Musculoskeletal: Positive for joint pain. Negative  for falls.  Skin: Negative for itching and rash.  Neurological: Negative for dizziness and loss of consciousness.  Endo/Heme/Allergies: Positive for environmental allergies.  Psychiatric/Behavioral: Negative for depression and memory loss. The patient is nervous/anxious. The patient does not have insomnia.     Past Medical History:  Diagnosis Date  . Allergic rhinitis due to pollen   . Anxiety state, unspecified   . Calculus of kidney   . Cataract   . Diaphragmatic hernia without mention of obstruction or gangrene   . Diffuse cystic mastopathy   . Dizziness and giddiness   . GERD (gastroesophageal reflux disease)   . Hiatal hernia   . Insomnia, unspecified   . Insomnia, unspecified   . Lumbago   . Macular degeneration (senile) of retina, unspecified   . Open wound of toe(s), without mention of complication   . Osteoarthrosis, unspecified whether generalized or localized, unspecified site   . Other and unspecified hyperlipidemia   . Personal history of fall   . Sciatica   . Sebaceous cyst   . Senile osteoporosis   . Unspecified constipation   . Unspecified essential hypertension   . Unspecified hereditary and idiopathic peripheral neuropathy   . Unspecified hypothyroidism   . Unspecified tinnitus    Past Surgical History:  Procedure Laterality Date  . CATARACT EXTRACTION, BILATERAL    . CHOLECYSTECTOMY  07/25/2007  . TONSILLECTOMY AND ADENOIDECTOMY Bilateral 1943   Social History:   reports  that she has never smoked. She has never used smokeless tobacco. She reports that she does not drink alcohol or use drugs.  Family History  Problem Relation Age of Onset  . Hypertension Brother   . Fibromyalgia Daughter   . Hypertension Son   . Hypertension Brother   . Hyperlipidemia Brother   . Breast cancer Neg Hx     Medications: Patient's Medications  New Prescriptions   No medications on file  Previous Medications   ACETAMINOPHEN (TYLENOL) 500 MG TABLET    Take 500 mg  by mouth every 4 (four) hours as needed.   ALPRAZOLAM (XANAX) 0.5 MG TABLET    TAKE 1 TABLET BY MOUTH 3 TIMES A DAY AS NEEDED FOR ANXIETY   DOXAZOSIN (CARDURA) 4 MG TABLET    Take 4 mg by mouth daily. evening for high blood pressure   KLOR-CON M20 20 MEQ TABLET    TAKE 1 TABLET BY MOUTH EVERY DAY   LEVOTHYROXINE (SYNTHROID, LEVOTHROID) 100 MCG TABLET    TAKE 1 TABLET EVERY DAY BEFORE BREAKFAST   LORATADINE (CLARITIN) 10 MG TABLET    Take 10 mg by mouth daily.    LOSARTAN (COZAAR) 100 MG TABLET    Take 0.5 tablets (50 mg total) by mouth daily.   METOPROLOL SUCCINATE (TOPROL-XL) 100 MG 24 HR TABLET    TAKE 1 TABLET BY MOUTH EVERY DAY WITH A MEAL TO CONTROL BLOOD PRESSURE.   PANTOPRAZOLE (PROTONIX) 40 MG TABLET    TAKE 1 TABLET BY MOUTH EVERY DAY   POLYETHYLENE GLYCOL (MIRALAX / GLYCOLAX) PACKET    Take 17 g by mouth daily.    PROPYLENE GLYCOL (SYSTANE BALANCE) 0.6 % SOLN    Place 1 drop into both eyes 2 (two) times daily.   SERTRALINE (ZOLOFT) 50 MG TABLET    TAKE 1 TABLET BY MOUTH EVERY DAY TO CALM NERVES   TEMAZEPAM (RESTORIL) 30 MG CAPSULE    Take 1 capsule (30 mg total) by mouth at bedtime as needed for sleep.  Modified Medications   No medications on file  Discontinued Medications   No medications on file     Physical Exam:  Vitals:   12/11/18 1037  BP: (!) 148/80  Pulse: (!) 59  SpO2: 95%  Weight: 114 lb 12.8 oz (52.1 kg)   Body mass index is 20.34 kg/m.  Physical Exam Constitutional:      General: She is not in acute distress.    Appearance: She is well-developed.     Comments: Thin female  HENT:     Head: Normocephalic and atraumatic.  Eyes:     Comments: glasses  Cardiovascular:     Rate and Rhythm: Normal rate and regular rhythm.     Heart sounds: Murmur present.  Pulmonary:     Effort: Pulmonary effort is normal. No respiratory distress.     Breath sounds: Normal breath sounds. No wheezing.  Abdominal:     General: Bowel sounds are normal. There is no  distension.     Palpations: Abdomen is soft.  Musculoskeletal: Normal range of motion.     Comments: Walks with cane  Skin:    General: Skin is warm and dry.     Capillary Refill: Capillary refill takes less than 2 seconds.  Neurological:     Mental Status: She is alert and oriented to person, place, and time.     Labs reviewed: Basic Metabolic Panel: Recent Labs    12/27/17 1026  03/19/18 1134 07/23/18 1020 12/05/18  1011  NA 132*   < > 132* 132* 132*  K 3.5   < > 4.1 3.6 3.3*  CL 88*   < > 90* 88* 88*  CO2 37*   < > 38* 39* 36*  GLUCOSE 109*   < > 85 98 109*  BUN 16   < > 17 18 20   CREATININE 0.72   < > 0.82 0.76 0.83  CALCIUM 9.9   < > 9.9 9.7 9.8  TSH 5.59*  --   --  6.71* 4.73*   < > = values in this interval not displayed.   Liver Function Tests: Recent Labs    03/05/18 1017 07/23/18 1020 12/05/18 1011  AST 16 15 14   ALT 12 10 9   BILITOT 0.5 0.5 0.6  PROT 6.5 6.4 6.5   No results for input(s): LIPASE, AMYLASE in the last 8760 hours. No results for input(s): AMMONIA in the last 8760 hours. CBC: Recent Labs    03/05/18 1017 07/23/18 1020 12/05/18 1011  WBC 4.9 3.7* 4.1  NEUTROABS 3,709 2,527 2,968  HGB 13.1 12.9 13.3  HCT 38.5 39.1 38.9  MCV 86.3 88.1 87.2  PLT 144 143 141   Lipid Panel: Recent Labs    03/05/18 1017  CHOL 175  HDL 59  LDLCALC 92  TRIG 147  CHOLHDL 3.0   TSH: Recent Labs    12/27/17 1026 07/23/18 1020 12/05/18 1011  TSH 5.59* 6.71* 4.73*   A1C: Lab Results  Component Value Date   HGBA1C  07/23/2007    5.7 (NOTE)   The ADA recommends the following therapeutic goals for glycemic   control related to Hgb A1C measurement:   Goal of Therapy:   < 7.0% Hgb A1C   Action Suggested:  > 8.0% Hgb A1C   Ref:  Diabetes Care, 22, Suppl. 1, 1999     Assessment/Plan 1. Essential hypertension -has improved with increasing losartan back to 100 mg daily and adding lozol back to regimen. Blood pressure slightly elevated today. Will  continue regimen and have her to cont to take blood pressure at home. To record and bring readings to next follow up.  - indapamide (LOZOL) 1.25 MG tablet; Take 1 tablet (1.25 mg total) by mouth daily.  Dispense: 30 tablet; Refill: 1 - losartan (COZAAR) 100 MG tablet; Take 1 tablet (100 mg total) by mouth daily.  Dispense: 90 tablet; Refill: 1 -to continue metoprolol succinate daily and doxazosin 4 mg  -to stay hydrated with proper fluid intake.   Total time 25 mins time greater than 50% of total time spent doing pt counseling and coordination of care in regards to blood pressure and medication management  Next appt: 12/24/2018 Shanda BumpsJessica K. Biagio BorgEubanks, AGNP  [facility] [phone]

## 2018-12-11 NOTE — Patient Instructions (Addendum)
To cont medication as you have been taking  Increase water intake   morning taking losartan 100 mg and toprol 100 mg  take lozol 1.25 mg in the afternoon and the doxazosin in the evening.   Follow up in 2 weeks with DR Renato Gails for blood pressure check

## 2018-12-12 ENCOUNTER — Telehealth: Payer: Self-pay

## 2018-12-12 NOTE — Telephone Encounter (Signed)
Patient states she took the whole losartan, Toprol, after lunch she takes the lozol and the cadura at bedtime. She wants to skip tonight of the lozol because she completely wet herself twice last night and had to change all clothes and bed. She did not check her BP this morning after all of that. She has ordered a new device to monitor BP.

## 2018-12-12 NOTE — Telephone Encounter (Signed)
Noted.  I had stopped the lozol but she restarted it on her own so it's ok not to take it tonight.

## 2018-12-18 DIAGNOSIS — H353221 Exudative age-related macular degeneration, left eye, with active choroidal neovascularization: Secondary | ICD-10-CM | POA: Diagnosis not present

## 2018-12-18 DIAGNOSIS — H353124 Nonexudative age-related macular degeneration, left eye, advanced atrophic with subfoveal involvement: Secondary | ICD-10-CM | POA: Diagnosis not present

## 2018-12-18 DIAGNOSIS — H3562 Retinal hemorrhage, left eye: Secondary | ICD-10-CM | POA: Diagnosis not present

## 2018-12-24 ENCOUNTER — Encounter: Payer: Self-pay | Admitting: Internal Medicine

## 2018-12-24 ENCOUNTER — Other Ambulatory Visit: Payer: Self-pay

## 2018-12-24 ENCOUNTER — Ambulatory Visit (INDEPENDENT_AMBULATORY_CARE_PROVIDER_SITE_OTHER): Payer: Medicare Other | Admitting: Internal Medicine

## 2018-12-24 VITALS — BP 132/68 | HR 55 | Temp 97.7°F | Ht 63.0 in | Wt 114.0 lb

## 2018-12-24 DIAGNOSIS — F411 Generalized anxiety disorder: Secondary | ICD-10-CM | POA: Diagnosis not present

## 2018-12-24 DIAGNOSIS — N3944 Nocturnal enuresis: Secondary | ICD-10-CM | POA: Diagnosis not present

## 2018-12-24 DIAGNOSIS — I10 Essential (primary) hypertension: Secondary | ICD-10-CM | POA: Diagnosis not present

## 2018-12-24 DIAGNOSIS — R42 Dizziness and giddiness: Secondary | ICD-10-CM

## 2018-12-24 MED ORDER — ALPRAZOLAM 0.5 MG PO TABS: ORAL_TABLET | ORAL | 1 refills | Status: DC

## 2018-12-24 MED ORDER — INDAPAMIDE 1.25 MG PO TABS: 1.2500 mg | ORAL_TABLET | ORAL | 1 refills | Status: DC

## 2018-12-24 NOTE — Progress Notes (Signed)
Location:  [facility] clinic Provider:  Arlana Canizales L. Mariea Clonts, D.O., C.M.D.  Code Status: DNR Goals of Care:  Advanced Directives 12/04/2018  Does Patient Have a Medical Advance Directive? Yes  Type of Advance Directive [place]  Does patient want to make changes to medical advance directive? No - Patient declined  Copy of [place] in Chart? Yes - validated most recent copy scanned in chart (See row information)  Would patient like information on creating a medical advance directive? -  Pre-existing out of facility DNR order (yellow form or pink MOST form) -    Chief Complaint  Patient presents with  . Medical Management of Chronic Issues    2 week follow-up on blood pressure    HPI: Patient is a 85 y.o. female seen today for medical management of chronic diseases.    Eyes are getting worse and still getting injections.  BP good here this morning.  We cut out her lozol diuretic b/c of her incontinence x 2.  Janett Billow told her to go back on the lozol.  She skipped one night.  The next week, she skipped one night.  She usually gets up three times to urinate and changes her pad in the middle of the night.  She has one glass water, one juice, one sprite, one supplement shake, two waters at dinner.  If she drinks more she soaks herself twice overnight.  Did not have the sinking feeling if she skips the diuretic.  Kayleen Memos was afraid she was going to fall a few times when she was weak.  She got better after juice and yogurt.   She agrees to try to take her diuretic (lozol) every other day only.  Reports BPs have been ok.  Potassium has run low--she splits the 42mq in half and takes two halves.  She is trying to eat a good breakfast.  She does not eat that well overall.  She is taking her nutrition shake daily.   Neuropathy and knees are getting harder.  She walks with her walker to the door.  She uses her cane in the apt due to the small space.  Her daughter is  getting their paper products and groceries.    Past Medical History:  Diagnosis Date  . Allergic rhinitis due to pollen   . Anxiety state, unspecified   . Calculus of kidney   . Cataract   . Diaphragmatic hernia without mention of obstruction or gangrene   . Diffuse cystic mastopathy   . Dizziness and giddiness   . GERD (gastroesophageal reflux disease)   . Hiatal hernia   . Insomnia, unspecified   . Insomnia, unspecified   . Lumbago   . Macular degeneration (senile) of retina, unspecified   . Open wound of toe(s), without mention of complication   . Osteoarthrosis, unspecified whether generalized or localized, unspecified site   . Other and unspecified hyperlipidemia   . Personal history of fall   . Sciatica   . Sebaceous cyst   . Senile osteoporosis   . Unspecified constipation   . Unspecified essential hypertension   . Unspecified hereditary and idiopathic peripheral neuropathy   . Unspecified hypothyroidism   . Unspecified tinnitus     Past Surgical History:  Procedure Laterality Date  . CATARACT EXTRACTION, BILATERAL    . CHOLECYSTECTOMY  07/25/2007  . TONSILLECTOMY AND ADENOIDECTOMY Bilateral 1943    Allergies  Allergen Reactions  . Ambien [Zolpidem Tartrate]     hallucination  .  Bactrim [Sulfamethoxazole-Trimethoprim]     Unknown reaction   . Morphine And Related Nausea And Vomiting  . Zithromax [Azithromycin]     Not effective      Outpatient Encounter Medications as of 12/24/2018  Medication Sig  . acetaminophen (TYLENOL) 500 MG tablet Take 500 mg by mouth every 4 (four) hours as needed.  . ALPRAZolam (XANAX) 0.5 MG tablet TAKE 1 TABLET BY MOUTH 3 TIMES A DAY AS NEEDED FOR ANXIETY  . doxazosin (CARDURA) 4 MG tablet Take 4 mg by mouth daily. evening for high blood pressure  . indapamide (LOZOL) 1.25 MG tablet Take 1 tablet (1.25 mg total) by mouth daily.  Marland Kitchen KLOR-CON M20 20 MEQ tablet TAKE 1 TABLET BY MOUTH EVERY DAY  . levothyroxine (SYNTHROID,  LEVOTHROID) 100 MCG tablet TAKE 1 TABLET EVERY DAY BEFORE BREAKFAST  . loratadine (CLARITIN) 10 MG tablet Take 10 mg by mouth daily.   Marland Kitchen losartan (COZAAR) 100 MG tablet Take 1 tablet (100 mg total) by mouth daily.  . metoprolol succinate (TOPROL-XL) 100 MG 24 hr tablet TAKE 1 TABLET BY MOUTH EVERY DAY WITH A MEAL TO CONTROL BLOOD PRESSURE.  . pantoprazole (PROTONIX) 40 MG tablet TAKE 1 TABLET BY MOUTH EVERY DAY  . polyethylene glycol (MIRALAX / GLYCOLAX) packet Take 17 g by mouth daily.   Marland Kitchen Propylene Glycol (SYSTANE BALANCE) 0.6 % SOLN Place 1 drop into both eyes 2 (two) times daily.  . sertraline (ZOLOFT) 50 MG tablet TAKE 1 TABLET BY MOUTH EVERY DAY TO CALM NERVES  . temazepam (RESTORIL) 30 MG capsule TAKE 1 CAPSULE AT BEDTIME AS NEEDED FOR SLEEP   No facility-administered encounter medications on file as of 12/24/2018.     Review of Systems:  Review of Systems  Constitutional: Negative for chills, fever and malaise/fatigue.  HENT: Positive for hearing loss.   Eyes: Positive for blurred vision.       Progressing  Respiratory: Negative for shortness of breath.   Cardiovascular: Negative for chest pain, palpitations and leg swelling.  Gastrointestinal: Negative for abdominal pain.  Genitourinary: Negative for dysuria.       Incontinence overnight  Musculoskeletal: Positive for joint pain. Negative for falls.  Skin: Negative for itching and rash.  Neurological: Negative for dizziness and loss of consciousness.  Endo/Heme/Allergies: Bruises/bleeds easily.  Psychiatric/Behavioral: Negative for depression and memory loss. The patient is nervous/anxious. The patient does not have insomnia.     Health Maintenance  Topic Date Due  . INFLUENZA VACCINE  03/16/2019  . TETANUS/TDAP  06/16/2027  . DEXA SCAN  Completed  . PNA vac Low Risk Adult  Completed    Physical Exam: Vitals:   12/24/18 1113  BP: 132/68  Pulse: (!) 55  Temp: 97.7 F (36.5 C)  TempSrc: Oral  SpO2: 95%  Weight:  114 lb (51.7 kg)  Height: _0  (1.6 m)   Body mass index is 20.19 kg/m. Physical Exam Vitals signs reviewed.  Constitutional:      Appearance: Normal appearance.  HENT:     Head: Normocephalic and atraumatic.  Eyes:     Comments: Poor eye contact with declining vision; glasses  Pulmonary:     Effort: Pulmonary effort is normal.  Musculoskeletal: Normal range of motion.     Comments: Ambulates with cane  Skin:    General: Skin is dry.  Neurological:     General: No focal deficit present.     Mental Status: She is alert and oriented to person, place, and time.  Psychiatric:  Mood and Affect: Mood normal.     Labs reviewed: Basic Metabolic Panel: Recent Labs    12/27/17 1026  03/19/18 1134 07/23/18 1020 12/05/18 1011  NA 132*   < > 132* 132* 132*  K 3.5   < > 4.1 3.6 3.3*  CL 88*   < > 90* 88* 88*  CO2 37*   < > 38* 39* 36*  GLUCOSE 109*   < > 85 98 109*  BUN 16   < > _0 CREATININE 0.72   < > 0.82 0.76 0.83  CALCIUM 9.9   < > 9.9 9.7 9.8  TSH 5.59*  --   --  6.71* 4.73*   < > = values in this interval not displayed.   Liver Function Tests: Recent Labs    03/05/18 1017 07/23/18 1020 12/05/18 1011  AST _1 ALT _2 BILITOT 0.5 0.5 0.6  PROT 6.5 6.4 6.5   No results for input(s): LIPASE, AMYLASE in the last 8760 hours. No results for input(s): AMMONIA in the last 8760 hours. CBC: Recent Labs    03/05/18 1017 07/23/18 1020 12/05/18 1011  WBC 4.9 3.7* 4.1  NEUTROABS 3,709 2,527 2,968  HGB 13.1 12.9 13.3  HCT 38.5 39.1 38.9  MCV 86.3 88.1 87.2  PLT 144 143 141   Lipid Panel: Recent Labs    03/05/18 1017  CHOL 175  HDL 59  LDLCALC 92  TRIG 147  CHOLHDL 3.0   Lab Results  Component Value Date   HGBA1C  07/23/2007    5.7 (NOTE)   The ADA recommends the following therapeutic goals for glycemic   control related to Hgb A1C measurement:   Goal of Therapy:   < 7.0% Hgb A1C   Action Suggested:  > 8.0% Hgb A1C   Ref:   Diabetes Care, 22, Suppl. 1, 1999    Procedures since last visit: No results found.  Assessment/Plan 1. Light-headed feeling -no problem lately -? If due to not eating rather than bp related, but also can be dehydration with her diuretic -I wanted to stop the diuretic altogether, but she had a high bp with the new cuff after that and is afraid to go w/o altogether so try every other day  2. Essential hypertension -bp fine here - decrease lozol to every other day - indapamide (LOZOL) 1.25 MG tablet; Take 1 tablet (1.25 mg total) by mouth every other day.  Dispense: 30 tablet; Refill: 1 -cont 75mq potassium daily due to hypokalemia this induces  3. Anxiety state - ongoing, has been on xanax for years before I met her and does not do well w/o it - ALPRAZolam (XANAX) 0.5 MG tablet; TAKE 1 TABLET BY MOUTH 3 TIMES A DAY AS NEEDED FOR ANXIETY  Dispense: 90 tablet; Refill: 1  4. Nocturnal enuresis -worsened with diuretic use--unclear that she truly needs a diuretic--try to reduce to every other day--she agrees today -monitor incontinence and blood pressures  Labs/tests ordered:  No new Next appt:  01/29/2019--f/u 3 mos with me and keep AWV in June   Bryer Gottsch L. Nikayla Madaris, D.O. [facility] [address] [place] [address]Cell Phone (Mon-Fri 8am-5pm):  [phone] Call:  [phone]& follow prompts after 5pm & weekends Office Phone:  [phone] Fax:  [phone]

## 2018-12-24 NOTE — Patient Instructions (Signed)
Take lozol ONLY every OTHER day.   Monitor your blood pressure and your incontinence at night.

## 2018-12-31 ENCOUNTER — Encounter: Payer: Medicare Other | Admitting: Family

## 2018-12-31 ENCOUNTER — Ambulatory Visit: Payer: Self-pay

## 2019-01-01 ENCOUNTER — Encounter: Payer: Medicare Other | Admitting: Family

## 2019-01-10 ENCOUNTER — Other Ambulatory Visit: Payer: Self-pay | Admitting: Internal Medicine

## 2019-01-10 DIAGNOSIS — G4700 Insomnia, unspecified: Secondary | ICD-10-CM

## 2019-01-23 ENCOUNTER — Other Ambulatory Visit: Payer: Self-pay | Admitting: Internal Medicine

## 2019-01-29 ENCOUNTER — Encounter: Payer: Medicare Other | Admitting: Family

## 2019-01-29 DIAGNOSIS — H353212 Exudative age-related macular degeneration, right eye, with inactive choroidal neovascularization: Secondary | ICD-10-CM | POA: Diagnosis not present

## 2019-01-29 DIAGNOSIS — H35362 Drusen (degenerative) of macula, left eye: Secondary | ICD-10-CM | POA: Diagnosis not present

## 2019-01-29 DIAGNOSIS — H353221 Exudative age-related macular degeneration, left eye, with active choroidal neovascularization: Secondary | ICD-10-CM | POA: Diagnosis not present

## 2019-01-29 DIAGNOSIS — H3562 Retinal hemorrhage, left eye: Secondary | ICD-10-CM | POA: Diagnosis not present

## 2019-01-29 DIAGNOSIS — H353124 Nonexudative age-related macular degeneration, left eye, advanced atrophic with subfoveal involvement: Secondary | ICD-10-CM | POA: Diagnosis not present

## 2019-02-01 ENCOUNTER — Ambulatory Visit (INDEPENDENT_AMBULATORY_CARE_PROVIDER_SITE_OTHER): Payer: Medicare Other | Admitting: Family

## 2019-02-01 ENCOUNTER — Encounter: Payer: Medicare Other | Admitting: Family

## 2019-02-01 ENCOUNTER — Other Ambulatory Visit: Payer: Self-pay

## 2019-02-01 DIAGNOSIS — Z0000 Encounter for general adult medical examination without abnormal findings: Secondary | ICD-10-CM

## 2019-02-01 NOTE — Patient Instructions (Signed)
[name] , Thank you for taking time to come for your Medicare Wellness Visit. I appreciate your ongoing commitment to your health goals. Please review the following plan we discussed and let me know if I can assist you in the future.   Screening recommendations/referrals: Colonoscopy N/A  Mammogram N/A  Bone Density: Up to date  Recommended yearly ophthalmology/optometry visit for glaucoma screening and checkup Recommended yearly dental visit for hygiene and checkup  Vaccinations: Influenza vaccine Up to date Pneumococcal vaccine Up to date Tdap vaccine Up to date due next 06/16/2027  Shingles vaccine:  Up to date   Advanced directives: Yes   Conditions/risks identified: Advance age female > 50 years,Hypertension,Hyperlidemia   Next appointment: 1 year   Preventive Care 83 Years and Older, Female Preventive care refers to lifestyle choices and visits with your health care provider that can promote health and wellness. What does preventive care include?  A yearly physical exam. This is also called an annual well check.  Dental exams once or twice a year.  Routine eye exams. Ask your health care provider how often you should have your eyes checked.  Personal lifestyle choices, including:  Daily care of your teeth and gums.  Regular physical activity.  Eating a healthy diet.  Avoiding tobacco and drug use.  Limiting alcohol use.  Practicing safe sex.  Taking low-dose aspirin every day.  Taking vitamin and mineral supplements as recommended by your health care provider. What happens during an annual well check? The services and screenings done by your health care provider during your annual well check will depend on your age, overall health, lifestyle risk factors, and family history of disease. Counseling  Your health care provider may ask you questions about your:  Alcohol use.  Tobacco use.  Drug use.  Emotional well-being.  Home and relationship  well-being.  Sexual activity.  Eating habits.  History of falls.  Memory and ability to understand (cognition).  Work and work Statistician.  Reproductive health. Screening  You may have the following tests or measurements:  Height, weight, and BMI.  Blood pressure.  Lipid and cholesterol levels. These may be checked every 5 years, or more frequently if you are over 52 years old.  Skin check.  Lung cancer screening. You may have this screening every year starting at age 83 if you have a 30-pack-year history of smoking and currently smoke or have quit within the past 15 years.  Fecal occult blood test (FOBT) of the stool. You may have this test every year starting at age 83.  Flexible sigmoidoscopy or colonoscopy. You may have a sigmoidoscopy every 5 years or a colonoscopy every 10 years starting at age 83.  Hepatitis C blood test.  Hepatitis B blood test.  Sexually transmitted disease (STD) testing.  Diabetes screening. This is done by checking your blood sugar (glucose) after you have not eaten for a while (fasting). You may have this done every 1-3 years.  Bone density scan. This is done to screen for osteoporosis. You may have this done starting at age 83.  Mammogram. This may be done every 1-2 years. Talk to your health care provider about how often you should have regular mammograms. Talk with your health care provider about your test results, treatment options, and if necessary, the need for more tests. Vaccines  Your health care provider may recommend certain vaccines, such as:  Influenza vaccine. This is recommended every year.  Tetanus, diphtheria, and acellular pertussis (Tdap, Td) vaccine. You  may need a Td booster every 10 years.  Zoster vaccine. You may need this after age 75.  Pneumococcal 13-valent conjugate (PCV13) vaccine. One dose is recommended after age 83.  Pneumococcal polysaccharide (PPSV23) vaccine. One dose is recommended after age 83.  Talk to your health care provider about which screenings and vaccines you need and how often you need them. This information is not intended to replace advice given to you by your health care provider. Make sure you discuss any questions you have with your health care provider. Document Released: 08/28/2015 Document Revised: 04/20/2016 Document Reviewed: 06/02/2015 Elsevier Interactive Patient Education  2017 [employer] Prevention in the Home Falls can cause injuries. They can happen to people of all ages. There are many things you can do to make your home safe and to help prevent falls. What can I do on the outside of my home?  Regularly fix the edges of walkways and driveways and fix any cracks.  Remove anything that might make you trip as you walk through a door, such as a raised step or threshold.  Trim any bushes or trees on the path to your home.  Use bright outdoor lighting.  Clear any walking paths of anything that might make someone trip, such as rocks or tools.  Regularly check to see if handrails are loose or broken. Make sure that both sides of any steps have handrails.  Any raised decks and porches should have guardrails on the edges.  Have any leaves, snow, or ice cleared regularly.  Use sand or salt on walking paths during winter.  Clean up any spills in your garage right away. This includes oil or grease spills. What can I do in the bathroom?  Use night lights.  Install grab bars by the toilet and in the tub and shower. Do not use towel bars as grab bars.  Use non-skid mats or decals in the tub or shower.  If you need to sit down in the shower, use a plastic, non-slip stool.  Keep the floor dry. Clean up any water that spills on the floor as soon as it happens.  Remove soap buildup in the tub or shower regularly.  Attach bath mats securely with double-sided non-slip rug tape.  Do not have throw rugs and other things on the floor that can make you  trip. What can I do in the bedroom?  Use night lights.  Make sure that you have a light by your bed that is easy to reach.  Do not use any sheets or blankets that are too big for your bed. They should not hang down onto the floor.  Have a firm chair that has side arms. You can use this for support while you get dressed.  Do not have throw rugs and other things on the floor that can make you trip. What can I do in the kitchen?  Clean up any spills right away.  Avoid walking on wet floors.  Keep items that you use a lot in easy-to-reach places.  If you need to reach something above you, use a strong step stool that has a grab bar.  Keep electrical cords out of the way.  Do not use floor polish or wax that makes floors slippery. If you must use wax, use non-skid floor wax.  Do not have throw rugs and other things on the floor that can make you trip. What can I do with my stairs?  Do not leave any items  on the stairs.  Make sure that there are handrails on both sides of the stairs and use them. Fix handrails that are broken or loose. Make sure that handrails are as long as the stairways.  Check any carpeting to make sure that it is firmly attached to the stairs. Fix any carpet that is loose or worn.  Avoid having throw rugs at the top or bottom of the stairs. If you do have throw rugs, attach them to the floor with carpet tape.  Make sure that you have a light switch at the top of the stairs and the bottom of the stairs. If you do not have them, ask someone to add them for you. What else can I do to help prevent falls?  Wear shoes that:  Do not have high heels.  Have rubber bottoms.  Are comfortable and fit you well.  Are closed at the toe. Do not wear sandals.  If you use a stepladder:  Make sure that it is fully opened. Do not climb a closed stepladder.  Make sure that both sides of the stepladder are locked into place.  Ask someone to hold it for you, if  possible.  Clearly mark and make sure that you can see:  Any grab bars or handrails.  First and last steps.  Where the edge of each step is.  Use tools that help you move around (mobility aids) if they are needed. These include:  Canes.  Walkers.  Scooters.  Crutches.  Turn on the lights when you go into a dark area. Replace any light bulbs as soon as they burn out.  Set up your furniture so you have a clear path. Avoid moving your furniture around.  If any of your floors are uneven, fix them.  If there are any pets around you, be aware of where they are.  Review your medicines with your doctor. Some medicines can make you feel dizzy. This can increase your chance of falling. Ask your doctor what other things that you can do to help prevent falls. This information is not intended to replace advice given to you by your health care provider. Make sure you discuss any questions you have with your health care provider. Document Released: 05/28/2009 Document Revised: 01/07/2016 Document Reviewed: 09/05/2014 Elsevier Interactive Patient Education  2017 [employer].

## 2019-02-01 NOTE — Progress Notes (Signed)
Subjective:   [name] is a 85 y.o. female who presents for Medicare Annual (Subsequent) preventive examination.  Review of Systems:   Cardiac Risk Factors include: advanced age (>3655men, 58>65 women);hypertension;dyslipidemia     Objective:     Vitals: There were no vitals taken for this visit.  There is no height or weight on file to calculate BMI.  Advanced Directives 12/04/2018 07/31/2018 12/27/2017 11/20/2017 08/31/2017 06/20/2017 06/15/2017  Does Patient Have a Medical Advance Directive? Yes Yes Yes Yes Yes Yes No  Type of Estate agentAdvance Directive Healthcare Power of [employer] Corporationttorney Healthcare Power of [place];Out of facility DNR (pink MOST or yellow form) Healthcare Power of [place];Living will;Out of facility DNR (pink MOST or yellow form) Living will;Out of facility DNR (pink MOST or yellow form);Healthcare Power of Attorney Living will;Out of facility DNR (pink MOST or yellow form) Living will -  Does patient want to make changes to medical advance directive? No - Patient declined No - Patient declined No - Patient declined No - Patient declined No - Patient declined No - Patient declined -  Copy of Healthcare Power of Attorney in Chart? Yes - validated most recent copy scanned in chart (See row information) Yes - validated most recent copy scanned in chart (See row information) Yes No - copy requested - - -  Would patient like information on creating a medical advance directive? - - - - - - No - Patient declined  Pre-existing out of facility DNR order (yellow form or pink MOST form) - Yellow form placed in chart (order not valid for inpatient use) Yellow form placed in chart (order not valid for inpatient use) Yellow form placed in chart (order not valid for inpatient use) Yellow form placed in chart (order not valid for inpatient use) - -    Tobacco Social History   Tobacco Use  Smoking Status Never Smoker  Smokeless Tobacco Never Used     Counseling given: Not Answered   Clinical  Intake:  Pre-visit preparation completed: No  Pain : 0-10 Pain Type: Chronic pain, Neuropathic pain(numbness on the hands) Pain Location: Other (Comment)(knees) Pain Orientation: Lower Pain Radiating Towards: No Pain Descriptors / Indicators: Aching Pain Onset: Other (comment)(several years) Pain Frequency: Constant Pain Relieving Factors: Extra strength Tylenol Effect of Pain on Daily Activities: affects walking  Pain Relieving Factors: Extra strength Tylenol  BMI - recorded: 21.22(recent in record) Nutritional Status: BMI of 19-24  Normal Nutritional Risks: None Diabetes: No  How often do you need to have someone help you when you read instructions, pamphlets, or other written materials from your doctor or pharmacy?: 1 - Never(can't read small print) What is the last grade level you completed in school?: 3 year degree College  Interpreter Needed?: No  Information entered by :: Weston Fulco FNP-c  Past Medical History:  Diagnosis Date  . Allergic rhinitis due to pollen   . Anxiety state, unspecified   . Calculus of kidney   . Cataract   . Diaphragmatic hernia without mention of obstruction or gangrene   . Diffuse cystic mastopathy   . Dizziness and giddiness   . GERD (gastroesophageal reflux disease)   . Hiatal hernia   . Insomnia, unspecified   . Insomnia, unspecified   . Lumbago   . Macular degeneration (senile) of retina, unspecified   . Open wound of toe(s), without mention of complication   . Osteoarthrosis, unspecified whether generalized or localized, unspecified site   . Other and unspecified hyperlipidemia   .  Personal history of fall   . Sciatica   . Sebaceous cyst   . Senile osteoporosis   . Unspecified constipation   . Unspecified essential hypertension   . Unspecified hereditary and idiopathic peripheral neuropathy   . Unspecified hypothyroidism   . Unspecified tinnitus    Past Surgical History:  Procedure Laterality Date  . CATARACT  EXTRACTION, BILATERAL    . CHOLECYSTECTOMY  07/25/2007  . TONSILLECTOMY AND ADENOIDECTOMY Bilateral 1943   Family History  Problem Relation Age of Onset  . Hypertension Brother   . Fibromyalgia Daughter   . Hypertension Son   . Hypertension Brother   . Hyperlipidemia Brother   . Breast cancer Neg Hx    Social History   Socioeconomic History  . Marital status: Married    Spouse name: Not on file  . Number of children: 2  . Years of education: Not on file  . Highest education level: Not on file  Occupational History  . Occupation: retired  Engineer, productionocial Needs  . Financial resource strain: Not hard at all  . Food insecurity    Worry: Never true    Inability: Never true  . Transportation needs    Medical: No    Non-medical: No  Tobacco Use  . Smoking status: Never Smoker  . Smokeless tobacco: Never Used  Substance and Sexual Activity  . Alcohol use: No    Alcohol/week: 0.0 standard drinks  . Drug use: No  . Sexual activity: Never  Lifestyle  . Physical activity    Days per week: 7 days    Minutes per session: 30 min  . Stress: Only a little  Relationships  . Social connections    Talks on phone: More than three times a week    Gets together: More than three times a week    Attends religious service: Never    Active member of club or organization: No    Attends meetings of clubs or organizations: Never    Relationship status: Married  Other Topics Concern  . Not on file  Social History Narrative  . Not on file    Outpatient Encounter Medications as of 02/01/2019  Medication Sig  . acetaminophen (TYLENOL) 500 MG tablet Take 500 mg by mouth every 4 (four) hours as needed.  . ALPRAZolam (XANAX) 0.5 MG tablet TAKE 1 TABLET BY MOUTH 3 TIMES A DAY AS NEEDED FOR ANXIETY  . doxazosin (CARDURA) 4 MG tablet Take 4 mg by mouth daily. evening for high blood pressure  . indapamide (LOZOL) 1.25 MG tablet Take 1 tablet (1.25 mg total) by mouth every other day.  Marland Kitchen. KLOR-CON M20 20  MEQ tablet TAKE 1 TABLET BY MOUTH EVERY DAY  . levothyroxine (SYNTHROID) 100 MCG tablet TAKE 1 TABLET BY MOUTH EVERY DAY BEFORE BREAKFAST  . loratadine (CLARITIN) 10 MG tablet Take 10 mg by mouth daily.   Marland Kitchen. losartan (COZAAR) 100 MG tablet Take 1 tablet (100 mg total) by mouth daily.  . metoprolol succinate (TOPROL-XL) 100 MG 24 hr tablet TAKE 1 TABLET BY MOUTH EVERY DAY WITH A MEAL TO CONTROL BLOOD PRESSURE.  . pantoprazole (PROTONIX) 40 MG tablet TAKE 1 TABLET BY MOUTH EVERY DAY  . polyethylene glycol (MIRALAX / GLYCOLAX) packet Take 17 g by mouth daily.   Marland Kitchen. Propylene Glycol (SYSTANE BALANCE) 0.6 % SOLN Place 1 drop into both eyes 2 (two) times daily.  . sertraline (ZOLOFT) 50 MG tablet TAKE 1 TABLET BY MOUTH EVERY DAY TO CALM NERVES  .  temazepam (RESTORIL) 30 MG capsule TAKE 1 CAPSULE BY MOUTH AT BEDTIME AS NEEDED FOR SLEEP   No facility-administered encounter medications on file as of 02/01/2019.     Activities of Daily Living In your present state of health, do you have any difficulty performing the following activities: 02/01/2019  Hearing? N  Vision? Y  Comment Follows up with Opthalmology for macula degeneration  Difficulty concentrating or making decisions? Y  Comment sometimes forgetful  Walking or climbing stairs? N  Dressing or bathing? N  Doing errands, shopping? Y  Comment husband Restaurant manager, fast food and eating ? Y  Comment can't cook anymore but can make sandwiches and eats at the facility  Using the Toilet? N  In the past six months, have you accidently leaked urine? Y  Comment wears a pad  Do you have problems with loss of bowel control? N  Managing your Medications? N  Managing your Finances? N  Housekeeping or managing your Housekeeping? N  Some recent data might be hidden    Patient Care Team: Gayland Curry, DO as PCP - General (Geriatric Medicine) Zadie Rhine Clent Demark, MD as Consulting Physician (Ophthalmology) Jarome Matin, MD as Consulting Physician  (Dermatology) Lafayette Dragon, MD (Inactive) as Consulting Physician (Gastroenterology)    Assessment:   This is a routine wellness examination for [place].  Exercise Activities and Dietary recommendations Current Exercise Habits: Structured exercise class, Type of exercise: walking, Time (Minutes): 20, Frequency (Times/Week): 7, Weekly Exercise (Minutes/Week): 140, Intensity: Mild, Exercise limited by: None identified  Goals    . <enter goal here>     Starting 10/05/16, I will maintain my current lifestyle; I will also attempt to increase my walking daily.        Fall Risk Fall Risk  02/01/2019 12/24/2018 12/06/2018 12/04/2018 07/31/2018  Falls in the past year? 1 0 1 1 0  Number falls in past yr: 0 0 1 0 0  Comment - - - - -  Injury with Fall? 0 0 0 1 0  Comment - - - - -  Risk Factor Category  - - - - -  Risk for fall due to : - - - - -  Follow up - - - - -   Is the patient's home free of loose throw rugs in walkways, pet beds, electrical cords, etc?   no      Grab bars in the bathroom? yes      Handrails on the stairs?  N/A no stairs       Adequate lighting?   yes  Depression Screen PHQ 2/9 Scores 02/01/2019 12/24/2018 12/06/2018 12/27/2017  PHQ - 2 Score 0 0 0 0     Cognitive Function MMSE - Mini Mental State Exam 12/27/2017 10/05/2016 10/28/2014  Not completed: (No Data) - -  Orientation to time 5 5 5   Orientation to Place 5 5 5   Registration 3 3 3   Attention/ Calculation 5 5 5   Recall 2 3 2   Language- name 2 objects 2 2 2   Language- repeat 1 1 1   Language- follow 3 step command 3 3 3   Language- read & follow direction 1 1 1   Write a sentence 1 1 1   Copy design 0 0 1  Total score 28 29 29      6CIT Screen 02/01/2019  What Year? 0 points  What month? 0 points  What time? 0 points  Count back from 20 2 points  Months in reverse 0 points  Repeat  phrase 2 points  Total Score 4    Immunization History  Administered Date(s) Administered  . Influenza, High Dose  Seasonal PF 05/04/2017, 05/10/2018  . Influenza,inj,Quad PF,6+ Mos 05/27/2013, 04/30/2014, 05/18/2015, 05/16/2016  . Influenza-Unspecified 05/04/2011  . PPD Test 06/20/2017  . Pneumococcal Conjugate-13 08/11/2015  . Pneumococcal-Unspecified 08/16/1999  . Td 12/07/2005  . Tdap 06/15/2017  . Zoster 08/14/2015    Qualifies for Shingles Vaccine? Up to date   Screening Tests Health Maintenance  Topic Date Due  . INFLUENZA VACCINE  03/16/2019  . TETANUS/TDAP  06/16/2027  . DEXA SCAN  Completed  . PNA vac Low Risk Adult  Completed    Cancer Screenings: Lung: Low Dose CT Chest recommended if Age 14-80 years, 30 pack-year currently smoking OR have quit w/in 15years. Patient does not qualify. Breast:  Up to date on Mammogram? N/A  Up to date of Bone Density/Dexa? Yes Colorectal: N/A   Additional Screenings:  Hepatitis C Screening: Low risk   Plan:    I have personally reviewed and noted the following in the patient's chart:   . Medical and social history . Use of alcohol, tobacco or illicit drugs  . Current medications and supplements . Functional ability and status . Nutritional status . Physical activity . Advanced directives . List of other physicians . Hospitalizations, surgeries, and ER visits in previous 12 months . Vitals . Screenings to include cognitive, depression, and falls . Referrals and appointments  In addition, I have reviewed and discussed with patient certain preventive protocols, quality metrics, and best practice recommendations. A written personalized care plan for preventive services as well as general preventive health recommendations were provided to patient.   Caesar Bookmaninah C Grahm Etsitty, NP  02/01/2019

## 2019-02-01 NOTE — Progress Notes (Signed)
Patient ID: [name], female   DOB: 10/03/1930, 85 y.o.   MRN: [id number] This service is provided via telemedicine  No vital signs collected/recorded due to the encounter was a telemedicine visit.   Location of patient (ex: home, work):  Home  Patient consents to a telephone visit:  Yes  Location of the provider (ex: office, home):    Name of any referring provider:  N/A  Names of all persons participating in the telemedicine service and their role in the encounter:  Elige Ko, Marisa Cyphers RMA, Marlowe Sax NP  Time spent on call:  10 min

## 2019-02-06 ENCOUNTER — Other Ambulatory Visit: Payer: Self-pay | Admitting: Internal Medicine

## 2019-02-06 DIAGNOSIS — I10 Essential (primary) hypertension: Secondary | ICD-10-CM

## 2019-02-10 ENCOUNTER — Other Ambulatory Visit: Payer: Self-pay | Admitting: Internal Medicine

## 2019-02-10 DIAGNOSIS — G4700 Insomnia, unspecified: Secondary | ICD-10-CM

## 2019-02-18 ENCOUNTER — Telehealth: Payer: Self-pay | Admitting: *Deleted

## 2019-02-18 NOTE — Telephone Encounter (Signed)
Patient stated that her BP is fine in the morning but high in the afternoon. Patient has not written any readings down. Stated that she is doing it correctly. Worries about it going up in the afternoon. Wants to go back to taking one a day. Please Advise.

## 2019-02-18 NOTE — Telephone Encounter (Signed)
Patient wants to go back to Indapamide 1.25 once daily instead of every other day. Stated that the every other day is not working. Please Advise.

## 2019-02-18 NOTE — Telephone Encounter (Signed)
Problem is that she was getting dizzy when she took one per day (which was why we reduced it).  If she wants to go back on one a day, we can do that.  She should then record the afternoon blood pressures and also make note if she does have any dizziness.

## 2019-02-18 NOTE — Telephone Encounter (Signed)
Can you get her blood pressure readings that made her says that the every other day is not working?  Are they lining up the bp cuff properly?

## 2019-02-19 NOTE — Telephone Encounter (Signed)
Patient notified and agreed.  

## 2019-02-21 ENCOUNTER — Ambulatory Visit: Payer: Medicare Other | Admitting: Family

## 2019-02-22 ENCOUNTER — Ambulatory Visit (INDEPENDENT_AMBULATORY_CARE_PROVIDER_SITE_OTHER): Payer: Medicare Other | Admitting: Family

## 2019-02-22 ENCOUNTER — Other Ambulatory Visit: Payer: Self-pay

## 2019-02-22 ENCOUNTER — Encounter: Payer: Self-pay | Admitting: Family

## 2019-02-22 VITALS — BP 150/80 | HR 60 | Temp 98.1°F | Ht 63.0 in | Wt 112.0 lb

## 2019-02-22 DIAGNOSIS — I10 Essential (primary) hypertension: Secondary | ICD-10-CM

## 2019-02-22 MED ORDER — LOSARTAN POTASSIUM 100 MG PO TABS: 100.0000 mg | ORAL_TABLET | Freq: Every day | ORAL | 1 refills | Status: DC

## 2019-02-22 NOTE — Patient Instructions (Signed)
1. Take indapamide 1.25 mg tablet one by mouth every other day  2. Losartan 100 mg tablet one by mouth daily.  3. Please have your blood pressure checked by the Nurse at the facility at least once a week and record.

## 2019-02-22 NOTE — Progress Notes (Signed)
Provider: Dinah Ngetich FNP-C  Kermit Baloeed, Tiffany L, DO  Patient Care Team: Kermit Baloeed, Tiffany L, DO as PCP - General (Geriatric Medicine) Edmon Crapeankin, Gary A, MD as Consulting Physician (Ophthalmology) Donzetta StarchJones, Drew, MD as Consulting Physician (Dermatology) Hart CarwinBrodie, Dora M, MD (Inactive) as Consulting Physician (Gastroenterology)  Extended Emergency Contact Information Primary Emergency Contact: Boehning,Carroll Address: [address]          [place], [place] [id number] [place] Home Phone: [phone] Mobile Phone: [phone] Relation: Spouse  Code Status:  Goals of care: Advanced Directive information Advanced Directives 12/04/2018  Does Patient Have a Medical Advance Directive? Yes  Type of Advance Directive Healthcare Power of Attorney  Does patient want to make changes to medical advance directive? No - Patient declined  Copy of Healthcare Power of Attorney in Chart? Yes - validated most recent copy scanned in chart (See row information)  Would patient like information on creating a medical advance directive? -  Pre-existing out of facility DNR order (yellow form or pink MOST form) -     Chief Complaint  Patient presents with  . Acute Visit    discuss blood pressure    HPI:  Pt is a 85 y.o. female seen today  for an acute visit for evaluation of high blood pressure.she is here with her husband.she states her blood pressure reading has been in the 140's/80's-170's/80's.she brought her blood pressure cuff to visit.Her blood pressure cuff is larger than her arm.which seems to be giving her higher readings.she is taking losartan 1/2 tablet ( 50 mg ) tablet instead of 100 mg.she recently started taking indapamide 1.25 mg tablet daily instead of every other day.Also taking metoprolol 100 mg tablet daily and doxazosin 4 mg tablet daily. She denies any headache,dizziness,chest pain or shortness of breath.she states   drinks plenty of fluid as directed by PCP in previous visit.    Past Medical History:  Diagnosis Date  . Allergic rhinitis due to pollen   . Anxiety state, unspecified   . Calculus of kidney   . Cataract   . Diaphragmatic hernia without mention of obstruction or gangrene   . Diffuse cystic mastopathy   . Dizziness and giddiness   . GERD (gastroesophageal reflux disease)   . Hiatal hernia   . Insomnia, unspecified   . Insomnia, unspecified   . Lumbago   . Macular degeneration (senile) of retina, unspecified   . Open wound of toe(s), without mention of complication   . Osteoarthrosis, unspecified whether generalized or localized, unspecified site   . Other and unspecified hyperlipidemia   . Personal history of fall   . Sciatica   . Sebaceous cyst   . Senile osteoporosis   . Unspecified constipation   . Unspecified essential hypertension   . Unspecified hereditary and idiopathic peripheral neuropathy   . Unspecified hypothyroidism   . Unspecified tinnitus    Past Surgical History:  Procedure Laterality Date  . CATARACT EXTRACTION, BILATERAL    . CHOLECYSTECTOMY  07/25/2007  . TONSILLECTOMY AND ADENOIDECTOMY Bilateral 1943    Allergies  Allergen Reactions  . Ambien [Zolpidem Tartrate]     hallucination  . Bactrim [Sulfamethoxazole-Trimethoprim]     Unknown reaction   . Morphine And Related Nausea And Vomiting  . Zithromax [Azithromycin]     Not effective      Outpatient Encounter Medications as of 02/22/2019  Medication Sig  . acetaminophen (TYLENOL) 500 MG tablet Take 500 mg by mouth every 4 (four) hours as needed.  . ALPRAZolam (  XANAX) 0.5 MG tablet TAKE 1 TABLET BY MOUTH 3 TIMES A DAY AS NEEDED FOR ANXIETY  . doxazosin (CARDURA) 4 MG tablet Take 1 tablet (4 mg total) by mouth daily.  . indapamide (LOZOL) 1.25 MG tablet Take 1 tablet (1.25 mg total) by mouth every other day.  Marland Kitchen. KLOR-CON M20 20 MEQ tablet TAKE 1 TABLET BY MOUTH EVERY DAY  . levothyroxine (SYNTHROID) 100 MCG tablet TAKE 1 TABLET BY MOUTH EVERY DAY BEFORE BREAKFAST   . loratadine (CLARITIN) 10 MG tablet Take 10 mg by mouth daily.   Marland Kitchen. losartan (COZAAR) 100 MG tablet Take 1 tablet (100 mg total) by mouth daily.  . metoprolol succinate (TOPROL-XL) 100 MG 24 hr tablet TAKE 1 TABLET BY MOUTH EVERY DAY WITH A MEAL TO CONTROL BLOOD PRESSURE.  . pantoprazole (PROTONIX) 40 MG tablet TAKE 1 TABLET BY MOUTH EVERY DAY  . polyethylene glycol (MIRALAX / GLYCOLAX) packet Take 17 g by mouth daily.   Marland Kitchen. Propylene Glycol (SYSTANE BALANCE) 0.6 % SOLN Place 1 drop into both eyes 2 (two) times daily.  . sertraline (ZOLOFT) 50 MG tablet TAKE 1 TABLET BY MOUTH EVERY DAY TO CALM NERVES  . temazepam (RESTORIL) 30 MG capsule TAKE 1 CAPSULE AT BEDTIME AS NEEDED FOR SLEEP   No facility-administered encounter medications on file as of 02/22/2019.     Review of Systems  Constitutional: Negative for appetite change, chills, fatigue and fever.  HENT: Negative for congestion, rhinorrhea, sinus pressure, sinus pain, sneezing and sore throat.   Eyes: Positive for visual disturbance. Negative for pain, discharge, redness and itching.       Wears corrective  Respiratory: Negative for cough, chest tightness, shortness of breath and wheezing.   Cardiovascular: Negative for chest pain, palpitations and leg swelling.  Gastrointestinal: Negative for abdominal distention, abdominal pain, constipation, diarrhea, nausea and vomiting.  Genitourinary: Negative for difficulty urinating, dysuria, flank pain and urgency.       Chronic urine frequency at night 2-3 times   Skin: Negative for color change, pallor and rash.  Neurological: Negative for dizziness, weakness, light-headedness and headaches.  Psychiatric/Behavioral: Negative for agitation and sleep disturbance. The patient is nervous/anxious.     Immunization History  Administered Date(s) Administered  . Influenza, High Dose Seasonal PF 05/04/2017, 05/10/2018  . Influenza,inj,Quad PF,6+ Mos 05/27/2013, 04/30/2014, 05/18/2015, 05/16/2016  .  Influenza-Unspecified 05/04/2011  . PPD Test 06/20/2017  . Pneumococcal Conjugate-13 08/11/2015  . Pneumococcal-Unspecified 08/16/1999  . Td 12/07/2005  . Tdap 06/15/2017  . Zoster 08/14/2015   Pertinent  Health Maintenance Due  Topic Date Due  . INFLUENZA VACCINE  03/16/2019  . DEXA SCAN  Completed  . PNA vac Low Risk Adult  Completed   Fall Risk  02/22/2019 02/01/2019 12/24/2018 12/06/2018 12/04/2018  Falls in the past year? 0 1 0 1 1  Number falls in past yr: 0 0 0 1 0  Comment - - - - -  Injury with Fall? 0 0 0 0 1  Comment - - - - -  Risk Factor Category  - - - - -  Risk for fall due to : - - - - -  Follow up - - - - -    Vitals:   02/22/19 1110  BP: (!) 150/80  Pulse: 60  Temp: 98.1 F (36.7 C)  TempSrc: Oral  SpO2: 95%  Weight: 112 lb (50.8 kg)  Height: 5\' 3"  (1.6 m)   Body mass index is 19.84 kg/m. Physical Exam Constitutional:  General: She is not in acute distress.    Appearance: She is normal weight. She is not ill-appearing.  HENT:     Nose: No congestion or rhinorrhea.     Mouth/Throat:     Mouth: Mucous membranes are moist.     Pharynx: Oropharynx is clear. No oropharyngeal exudate or posterior oropharyngeal erythema.  Eyes:     General: No scleral icterus.       Right eye: No discharge.        Left eye: No discharge.     Conjunctiva/sclera: Conjunctivae normal.     Pupils: Pupils are equal, round, and reactive to light.     Comments: corrective lens in place   Neck:     Musculoskeletal: Normal range of motion. No neck rigidity or muscular tenderness.     Vascular: No carotid bruit.  Cardiovascular:     Rate and Rhythm: Normal rate and regular rhythm.     Pulses: Normal pulses.     Heart sounds: Murmur present. No friction rub. No gallop.   Pulmonary:     Effort: Pulmonary effort is normal. No respiratory distress.     Breath sounds: Normal breath sounds. No wheezing, rhonchi or rales.  Chest:     Chest wall: No tenderness.  Abdominal:      General: Bowel sounds are normal. There is no distension.     Palpations: Abdomen is soft. There is no mass.     Tenderness: There is no abdominal tenderness. There is no right CVA tenderness, left CVA tenderness, guarding or rebound.  Musculoskeletal: Normal range of motion.        General: No swelling or tenderness.     Right lower leg: No edema.     Left lower leg: No edema.     Comments: Moves x 4 extremities.unsteady gait walks with a cane   Lymphadenopathy:     Cervical: No cervical adenopathy.  Skin:    General: Skin is warm and dry.     Coloration: Skin is not pale.     Findings: No bruising, erythema or rash.  Neurological:     Mental Status: She is alert and oriented to person, place, and time.     Cranial Nerves: No cranial nerve deficit.     Sensory: No sensory deficit.     Motor: No weakness.     Gait: Gait abnormal.  Psychiatric:        Mood and Affect: Mood is anxious. Mood is not depressed.        Speech: Speech normal.        Behavior: Behavior normal.        Thought Content: Thought content normal.        Judgment: Judgment normal.   Labs reviewed: Recent Labs    03/19/18 1134 07/23/18 1020 12/05/18 1011  NA 132* 132* 132*  K 4.1 3.6 3.3*  CL 90* 88* 88*  CO2 38* 39* 36*  GLUCOSE 85 98 109*  BUN 17 18 20   CREATININE 0.82 0.76 0.83  CALCIUM 9.9 9.7 9.8   Recent Labs    03/05/18 1017 07/23/18 1020 12/05/18 1011  AST 16 15 14   ALT 12 10 9   BILITOT 0.5 0.5 0.6  PROT 6.5 6.4 6.5   Recent Labs    03/05/18 1017 07/23/18 1020 12/05/18 1011  WBC 4.9 3.7* 4.1  NEUTROABS 3,709 2,527 2,968  HGB 13.1 12.9 13.3  HCT 38.5 39.1 38.9  MCV 86.3 88.1 87.2  PLT 144 143  141   Lab Results  Component Value Date   TSH 4.73 (H) 12/05/2018   Lab Results  Component Value Date   HGBA1C  07/23/2007    5.7 (NOTE)   The ADA recommends the following therapeutic goals for glycemic   control related to Hgb A1C measurement:   Goal of Therapy:   < 7.0% Hgb A1C    Action Suggested:  > 8.0% Hgb A1C   Ref:  Diabetes Care, 22, Suppl. 1, 1999   Lab Results  Component Value Date   CHOL 175 03/05/2018   HDL 59 03/05/2018   LDLCALC 92 03/05/2018   TRIG 147 03/05/2018   CHOLHDL 3.0 03/05/2018    Significant Diagnostic Results in last 30 days:  No results found.  Assessment/Plan  Essential hypertension Her medication reviewed was taking losartan 1/2 tablet (50 mg ) instead of 100 mg.I've discussed with patient and husband to take Losartan 100 mg tablet one by mouth daily.indapamide 1.25 mg tablet one every other day,metoprolol 100 mg tablet daily and doxazosin 4 mg tablet daily.encouraged to get her blood pressure checked by the nursing staff at the facility.   Family/ staff Communication: Reviewed plan of care with patient and husband.   Labs/tests ordered: None   Dinah C Ngetich, NP

## 2019-02-24 ENCOUNTER — Other Ambulatory Visit: Payer: Self-pay | Admitting: Internal Medicine

## 2019-02-24 DIAGNOSIS — F411 Generalized anxiety disorder: Secondary | ICD-10-CM

## 2019-02-25 NOTE — Telephone Encounter (Signed)
Last filled 12/24/2018

## 2019-03-12 ENCOUNTER — Other Ambulatory Visit: Payer: Self-pay | Admitting: Internal Medicine

## 2019-03-12 DIAGNOSIS — G4700 Insomnia, unspecified: Secondary | ICD-10-CM

## 2019-03-12 NOTE — Telephone Encounter (Signed)
Last filled 02/11/2019

## 2019-03-18 ENCOUNTER — Other Ambulatory Visit: Payer: Self-pay

## 2019-03-18 ENCOUNTER — Ambulatory Visit (INDEPENDENT_AMBULATORY_CARE_PROVIDER_SITE_OTHER): Payer: Medicare Other | Admitting: Internal Medicine

## 2019-03-18 ENCOUNTER — Encounter: Payer: Self-pay | Admitting: Internal Medicine

## 2019-03-18 VITALS — BP 150/80 | HR 64 | Temp 98.3°F | Ht 63.0 in | Wt 111.0 lb

## 2019-03-18 DIAGNOSIS — F411 Generalized anxiety disorder: Secondary | ICD-10-CM

## 2019-03-18 DIAGNOSIS — I10 Essential (primary) hypertension: Secondary | ICD-10-CM

## 2019-03-18 NOTE — Progress Notes (Signed)
Location:  [facility]   Advanced Directives 12/04/2018  Does Patient Have a Medical Advance Directive? Yes  Type of Advance Directive Healthcare Power of Attorney  Does patient want to make changes to medical advance directive? No - Patient declined  Copy of Healthcare Power of Attorney in Chart? Yes - validated most recent copy scanned in chart (See row information)  Would patient like information on creating a medical advance directive? -  Pre-existing out of facility DNR order (yellow form or pink MOST form) -     Chief Complaint  Patient presents with  . Acute Visit    discuss blood pressure    HPI: Patient is a 85 y.o. female seen today for medical management of chronic diseases.    Patient presents with concerns over high blood pressure readings. In the past two weeks she has had two blood pressure readings greater than 180/80. The first reading was taken at [facility] and the patient was physically exerted after walking the parking lot and elevator. Staff did not ask her to rest a few minutes before taking. The second high pressure was with her personal BP cuff, which ended up being too big for her arm. She is taking her prescribed medications daily. Her diet is not always low in sodium because she does not like the food at [facility]. She will eat deli meat or soups when she does not like what offered. She denies any recent falls, injures, or dizziness.   Past Medical History:  Diagnosis Date  . Allergic rhinitis due to pollen   . Anxiety state, unspecified   . Calculus of kidney   . Cataract   . Diaphragmatic hernia without mention of obstruction or gangrene   . Diffuse cystic mastopathy   . Dizziness and giddiness   . GERD (gastroesophageal reflux disease)   . Hiatal hernia   . Insomnia, unspecified   . Insomnia, unspecified   . Lumbago   . Macular degeneration (senile) of retina, unspecified   . Open wound of toe(s), without mention of complication   .  Osteoarthrosis, unspecified whether generalized or localized, unspecified site   . Other and unspecified hyperlipidemia   . Personal history of fall   . Sciatica   . Sebaceous cyst   . Senile osteoporosis   . Unspecified constipation   . Unspecified essential hypertension   . Unspecified hereditary and idiopathic peripheral neuropathy   . Unspecified hypothyroidism   . Unspecified tinnitus     Past Surgical History:  Procedure Laterality Date  . CATARACT EXTRACTION, BILATERAL    . CHOLECYSTECTOMY  07/25/2007  . TONSILLECTOMY AND ADENOIDECTOMY Bilateral 1943    Allergies  Allergen Reactions  . Ambien [Zolpidem Tartrate]     hallucination  . Bactrim [Sulfamethoxazole-Trimethoprim]     Unknown reaction   . Morphine And Related Nausea And Vomiting  . Zithromax [Azithromycin]     Not effective      Outpatient Encounter Medications as of 03/18/2019  Medication Sig  . acetaminophen (TYLENOL) 500 MG tablet Take 500 mg by mouth every 4 (four) hours as needed.  . ALPRAZolam (XANAX) 0.5 MG tablet Take 1 tablet (0.5 mg total) by mouth 3 (three) times daily as needed for anxiety.  Marland Kitchen. doxazosin (CARDURA) 4 MG tablet Take 1 tablet (4 mg total) by mouth daily.  . indapamide (LOZOL) 1.25 MG tablet Take 1 tablet (1.25 mg total) by mouth every other day.  Marland Kitchen. KLOR-CON M20 20 MEQ tablet TAKE 1 TABLET BY MOUTH EVERY  DAY  . levothyroxine (SYNTHROID) 100 MCG tablet TAKE 1 TABLET BY MOUTH EVERY DAY BEFORE BREAKFAST  . loratadine (CLARITIN) 10 MG tablet Take 10 mg by mouth daily.   Marland Kitchen losartan (COZAAR) 100 MG tablet Take 1 tablet (100 mg total) by mouth daily.  . metoprolol succinate (TOPROL-XL) 100 MG 24 hr tablet TAKE 1 TABLET BY MOUTH EVERY DAY WITH A MEAL TO CONTROL BLOOD PRESSURE.  . pantoprazole (PROTONIX) 40 MG tablet TAKE 1 TABLET BY MOUTH EVERY DAY  . polyethylene glycol (MIRALAX / GLYCOLAX) packet Take 17 g by mouth daily.   Marland Kitchen Propylene Glycol (SYSTANE BALANCE) 0.6 % SOLN Place 1 drop into  both eyes 2 (two) times daily.  . sertraline (ZOLOFT) 50 MG tablet TAKE 1 TABLET BY MOUTH EVERY DAY TO CALM NERVES  . temazepam (RESTORIL) 30 MG capsule TAKE 1 CAPSULE BY MOUTH AT BEDTIME AS NEEDED FOR SLEEP   No facility-administered encounter medications on file as of 03/18/2019.     Review of Systems:  Review of Systems  Constitutional: Negative for activity change, appetite change and unexpected weight change.  HENT: Negative for dental problem, hearing loss and trouble swallowing.   Eyes: Positive for photophobia and visual disturbance.  Respiratory: Negative for cough, shortness of breath and wheezing.   Cardiovascular: Negative for chest pain, palpitations and leg swelling.  Musculoskeletal: Positive for back pain.  Neurological: Negative for dizziness, light-headedness and headaches.  Psychiatric/Behavioral: Negative for dysphoric mood, sleep disturbance and suicidal ideas. The patient is nervous/anxious.     Health Maintenance  Topic Date Due  . INFLUENZA VACCINE  03/16/2019  . TETANUS/TDAP  06/16/2027  . DEXA SCAN  Completed  . PNA vac Low Risk Adult  Completed    Physical Exam: Vitals:   03/18/19 1133  BP: (!) 150/80  Pulse: 64  Temp: 98.3 F (36.8 C)  TempSrc: Oral  SpO2: 95%  Weight: 111 lb (50.3 kg)   Body mass index is 19.66 kg/m. Physical Exam Vitals signs reviewed.  Constitutional:      General: She is not in acute distress.    Appearance: Normal appearance. She is not ill-appearing.  HENT:     Head: Normocephalic.     Mouth/Throat:     Mouth: Mucous membranes are dry.  Cardiovascular:     Rate and Rhythm: Normal rate and regular rhythm.     Pulses: Normal pulses.     Heart sounds: No murmur.  Pulmonary:     Effort: No respiratory distress.     Breath sounds: Normal breath sounds. No wheezing.  Musculoskeletal:     Right lower leg: No edema.     Left lower leg: No edema.  Skin:    General: Skin is warm and dry.     Capillary Refill:  Capillary refill takes 2 to 3 seconds.  Neurological:     General: No focal deficit present.     Mental Status: She is alert and oriented to person, place, and time.  Psychiatric:        Mood and Affect: Mood normal.        Behavior: Behavior normal.     Labs reviewed: Basic Metabolic Panel: Recent Labs    03/19/18 1134 07/23/18 1020 12/05/18 1011  NA 132* 132* 132*  K 4.1 3.6 3.3*  CL 90* 88* 88*  CO2 38* 39* 36*  GLUCOSE 85 98 109*  BUN 17 18 20   CREATININE 0.82 0.76 0.83  CALCIUM 9.9 9.7 9.8  TSH  --  6.71* 4.73*   Liver Function Tests: Recent Labs    07/23/18 1020 12/05/18 1011  AST 15 14  ALT 10 9  BILITOT 0.5 0.6  PROT 6.4 6.5   No results for input(s): LIPASE, AMYLASE in the last 8760 hours. No results for input(s): AMMONIA in the last 8760 hours. CBC: Recent Labs    07/23/18 1020 12/05/18 1011  WBC 3.7* 4.1  NEUTROABS 2,527 2,968  HGB 12.9 13.3  HCT 39.1 38.9  MCV 88.1 87.2  PLT 143 141   Lipid Panel: No results for input(s): CHOL, HDL, LDLCALC, TRIG, CHOLHDL, LDLDIRECT in the last 8760 hours. Lab Results  Component Value Date   HGBA1C  07/23/2007    5.7 (NOTE)   The ADA recommends the following therapeutic goals for glycemic   control related to Hgb A1C measurement:   Goal of Therapy:   < 7.0% Hgb A1C   Action Suggested:  > 8.0% Hgb A1C   Ref:  Diabetes Care, 22, Suppl. 1, 1999    Procedures since last visit: No results found.  Assessment/Plan 1. Essential hypertension - bp is at goal of <150/90 here, no symptoms suggesting high blood pressure or low blood pressure -recommend taking blood pressure the same time everyday, two hours after taking morning blood pressure medications.  - start recording bp in journal after new cuff arrives - continue low sodium diet  -continue current medications prescribed   Labs/tests ordered:  none Next appt:  03/25/2019

## 2019-03-18 NOTE — Patient Instructions (Addendum)
Get a child's size cuff and take your blood pressure consistently about an hour after your medications in the am.  You may have some blood pressures that run high.  I'm only concerned if it's high consistently over 150/90.  We don't want it too low because then you get dizzy.

## 2019-03-19 DIAGNOSIS — H353212 Exudative age-related macular degeneration, right eye, with inactive choroidal neovascularization: Secondary | ICD-10-CM | POA: Diagnosis not present

## 2019-03-19 DIAGNOSIS — H353221 Exudative age-related macular degeneration, left eye, with active choroidal neovascularization: Secondary | ICD-10-CM | POA: Diagnosis not present

## 2019-03-19 DIAGNOSIS — H353124 Nonexudative age-related macular degeneration, left eye, advanced atrophic with subfoveal involvement: Secondary | ICD-10-CM | POA: Diagnosis not present

## 2019-03-25 ENCOUNTER — Encounter: Payer: Self-pay | Admitting: Internal Medicine

## 2019-03-25 ENCOUNTER — Other Ambulatory Visit: Payer: Self-pay

## 2019-03-25 ENCOUNTER — Ambulatory Visit (INDEPENDENT_AMBULATORY_CARE_PROVIDER_SITE_OTHER): Payer: Medicare Other | Admitting: Internal Medicine

## 2019-03-25 VITALS — BP 160/90 | HR 72 | Temp 98.0°F | Ht 63.0 in | Wt 112.0 lb

## 2019-03-25 DIAGNOSIS — F411 Generalized anxiety disorder: Secondary | ICD-10-CM

## 2019-03-25 DIAGNOSIS — I10 Essential (primary) hypertension: Secondary | ICD-10-CM

## 2019-03-25 MED ORDER — IRBESARTAN 150 MG PO TABS: 150.0000 mg | ORAL_TABLET | Freq: Every day | ORAL | 0 refills | Status: DC

## 2019-03-25 NOTE — Patient Instructions (Signed)

## 2019-03-25 NOTE — Progress Notes (Signed)
Location:  [facility]   Advanced Directives 12/04/2018  Does Patient Have a Medical Advance Directive? Yes  Type of Advance Directive [place]  Does patient want to make changes to medical advance directive? No - Patient declined  Copy of [place] in Chart? Yes - validated most recent copy scanned in chart (See row information)  Would patient like information on creating a medical advance directive? -  Pre-existing out of facility DNR order (yellow form or pink MOST form) -     Chief Complaint  Patient presents with  . Medical Management of Chronic Issues    41mth follow-up    HPI: Patient is a 85 y.o. female seen today for follow up on blood pressure.   Her blood pressures continue to remain high. Since last visit, she has purchased a smaller cuff. She takes her BP medications daily, in the morning at 10 AM and will check her BP at 7:25DG. Her systolic average is greater than 644 and diastolic pressures range between 70-90's. She has tried to limit sodium in her diet and caffeine. She denies any worsening blurred vision, but states she is having more headaches than normal and palpitations. Her BP has also increased her anxiety and she is taking xanax daily.     Past Medical History:  Diagnosis Date  . Allergic rhinitis due to pollen   . Anxiety state, unspecified   . Calculus of kidney   . Cataract   . Diaphragmatic hernia without mention of obstruction or gangrene   . Diffuse cystic mastopathy   . Dizziness and giddiness   . GERD (gastroesophageal reflux disease)   . Hiatal hernia   . Insomnia, unspecified   . Insomnia, unspecified   . Lumbago   . Macular degeneration (senile) of retina, unspecified   . Open wound of toe(s), without mention of complication   . Osteoarthrosis, unspecified whether generalized or localized, unspecified site   . Other and unspecified hyperlipidemia   . Personal history of fall   . Sciatica   .  Sebaceous cyst   . Senile osteoporosis   . Unspecified constipation   . Unspecified essential hypertension   . Unspecified hereditary and idiopathic peripheral neuropathy   . Unspecified hypothyroidism   . Unspecified tinnitus     Past Surgical History:  Procedure Laterality Date  . CATARACT EXTRACTION, BILATERAL    . CHOLECYSTECTOMY  07/25/2007  . TONSILLECTOMY AND ADENOIDECTOMY Bilateral 1943    Allergies  Allergen Reactions  . Ambien [Zolpidem Tartrate]     hallucination  . Bactrim [Sulfamethoxazole-Trimethoprim]     Unknown reaction   . Morphine And Related Nausea And Vomiting  . Zithromax [Azithromycin]     Not effective      Outpatient Encounter Medications as of 03/25/2019  Medication Sig  . acetaminophen (TYLENOL) 500 MG tablet Take 500 mg by mouth every 4 (four) hours as needed.  . ALPRAZolam (XANAX) 0.5 MG tablet Take 1 tablet (0.5 mg total) by mouth 3 (three) times daily as needed for anxiety.  Marland Kitchen doxazosin (CARDURA) 4 MG tablet Take 1 tablet (4 mg total) by mouth daily.  . indapamide (LOZOL) 1.25 MG tablet Take 1 tablet (1.25 mg total) by mouth every other day.  Marland Kitchen KLOR-CON M20 20 MEQ tablet TAKE 1 TABLET BY MOUTH EVERY DAY  . levothyroxine (SYNTHROID) 100 MCG tablet TAKE 1 TABLET BY MOUTH EVERY DAY BEFORE BREAKFAST  . loratadine (CLARITIN) 10 MG tablet Take 10 mg by mouth  daily.   . losartan (COZAAR) 100 MG tablet Take 1 tablet (100 mg total) by mouth daily.  . metoprolol succinate (TOPROL-XL) 100 MG 24 hr tablet TAKE 1 TABLET BY MOUTH EVERY DAY WITH A MEAL TO CONTROL BLOOD PRESSURE.  . pantoprazole (PROTONIX) 40 MG tablet TAKE 1 TABLET BY MOUTH EVERY DAY  . polyethylene glycol (MIRALAX / GLYCOLAX) packet Take 17 g by mouth daily.   Marland Kitchen. Propylene Glycol (SYSTANE BALANCE) 0.6 % SOLN Place 1 drop into both eyes 2 (two) times daily.  . sertraline (ZOLOFT) 50 MG tablet TAKE 1 TABLET BY MOUTH EVERY DAY TO CALM NERVES  . temazepam (RESTORIL) 30 MG capsule TAKE 1 CAPSULE BY  MOUTH AT BEDTIME AS NEEDED FOR SLEEP   No facility-administered encounter medications on file as of 03/25/2019.     Review of Systems:  Review of Systems  Constitutional: Negative for activity change, fatigue and unexpected weight change.  Eyes:       History of blurred vision  Respiratory: Positive for shortness of breath. Negative for cough and wheezing.   Cardiovascular: Positive for palpitations. Negative for chest pain and leg swelling.  Neurological: Positive for headaches.  Psychiatric/Behavioral: Negative for dysphoric mood. The patient is nervous/anxious.   All other systems reviewed and are negative.   Health Maintenance  Topic Date Due  . INFLUENZA VACCINE  03/16/2019  . TETANUS/TDAP  06/16/2027  . DEXA SCAN  Completed  . PNA vac Low Risk Adult  Completed    Physical Exam: Vitals:   03/25/19 1149  BP: (!) 160/90  Pulse: 72  Temp: 98 F (36.7 C)  TempSrc: Oral  SpO2: 98%  Weight: 112 lb (50.8 kg)  Height: 5\' 3"  (1.6 m)   Body mass index is 19.84 kg/m. Physical Exam Vitals signs reviewed.  Constitutional:      General: She is not in acute distress.    Appearance: Normal appearance. She is normal weight. She is not ill-appearing.  HENT:     Head: Normocephalic.     Mouth/Throat:     Mouth: Mucous membranes are moist.  Cardiovascular:     Rate and Rhythm: Normal rate and regular rhythm.     Pulses: Normal pulses.     Heart sounds: Normal heart sounds. No murmur.  Pulmonary:     Effort: Pulmonary effort is normal. No respiratory distress.     Breath sounds: Normal breath sounds. No wheezing.  Abdominal:     General: Abdomen is flat.     Palpations: Abdomen is soft.  Musculoskeletal:     Right lower leg: No edema.     Left lower leg: No edema.  Skin:    General: Skin is warm and dry.     Capillary Refill: Capillary refill takes less than 2 seconds.  Neurological:     General: No focal deficit present.     Mental Status: She is alert and oriented  to person, place, and time. Mental status is at baseline.  Psychiatric:        Mood and Affect: Mood normal.        Behavior: Behavior normal.        Thought Content: Thought content normal.        Judgment: Judgment normal.     Labs reviewed: Basic Metabolic Panel: Recent Labs    07/23/18 1020 12/05/18 1011  NA 132* 132*  K 3.6 3.3*  CL 88* 88*  CO2 39* 36*  GLUCOSE 98 109*  BUN 18 20  CREATININE  0.76 0.83  CALCIUM 9.7 9.8  TSH 6.71* 4.73*   Liver Function Tests: Recent Labs    07/23/18 1020 12/05/18 1011  AST 15 14  ALT 10 9  BILITOT 0.5 0.6  PROT 6.4 6.5   No results for input(s): LIPASE, AMYLASE in the last 8760 hours. No results for input(s): AMMONIA in the last 8760 hours. CBC: Recent Labs    07/23/18 1020 12/05/18 1011  WBC 3.7* 4.1  NEUTROABS 2,527 2,968  HGB 12.9 13.3  HCT 39.1 38.9  MCV 88.1 87.2  PLT 143 141   Lipid Panel: No results for input(s): CHOL, HDL, LDLCALC, TRIG, CHOLHDL, LDLDIRECT in the last 8760 hours. Lab Results  Component Value Date   HGBA1C  07/23/2007    5.7 (NOTE)   The ADA recommends the following therapeutic goals for glycemic   control related to Hgb A1C measurement:   Goal of Therapy:   < 7.0% Hgb A1C   Action Suggested:  > 8.0% Hgb A1C   Ref:  Diabetes Care, 22, Suppl. 1, 1999    Procedures since last visit: No results found.  Assessment/Plan 1. Uncontrolled hypertension - ongoing, even with new blood pressure cuff, anxiety could be contributing factor - discontinue losartan - start irbesartan 150 mg by mouth daily -Dash diet -continue to take blood pressures daily - follow up in two weeks  2. Anxiety - ongoing for years - continue to take xanax as prescribed - recommend meditation    Labs/tests ordered:  none Next appt:  Follow up in 2 weeks with blood pressure readings

## 2019-03-31 ENCOUNTER — Other Ambulatory Visit: Payer: Self-pay | Admitting: Internal Medicine

## 2019-03-31 DIAGNOSIS — F411 Generalized anxiety disorder: Secondary | ICD-10-CM

## 2019-04-01 NOTE — Telephone Encounter (Signed)
Last filled 02/25/2019

## 2019-04-02 ENCOUNTER — Other Ambulatory Visit: Payer: Self-pay | Admitting: Internal Medicine

## 2019-04-08 ENCOUNTER — Ambulatory Visit (INDEPENDENT_AMBULATORY_CARE_PROVIDER_SITE_OTHER): Payer: Medicare Other | Admitting: Internal Medicine

## 2019-04-08 ENCOUNTER — Other Ambulatory Visit: Payer: Self-pay

## 2019-04-08 ENCOUNTER — Encounter: Payer: Self-pay | Admitting: Internal Medicine

## 2019-04-08 VITALS — BP 154/94 | HR 65 | Temp 98.1°F | Ht 63.0 in | Wt 112.0 lb

## 2019-04-08 DIAGNOSIS — I10 Essential (primary) hypertension: Secondary | ICD-10-CM | POA: Diagnosis not present

## 2019-04-08 DIAGNOSIS — Z23 Encounter for immunization: Secondary | ICD-10-CM

## 2019-04-08 MED ORDER — IRBESARTAN 150 MG PO TABS: 150.0000 mg | ORAL_TABLET | Freq: Every day | ORAL | 3 refills | Status: DC

## 2019-04-08 NOTE — Progress Notes (Signed)
Location:  [facility]   Advanced Directives 12/04/2018  Does Patient Have a Medical Advance Directive? Yes  Type of Advance Directive [place]  Does patient want to make changes to medical advance directive? No - Patient declined  Copy of [place] in Chart? Yes - validated most recent copy scanned in chart (See row information)  Would patient like information on creating a medical advance directive? -  Pre-existing out of facility DNR order (yellow form or pink MOST form) -     Chief Complaint  Patient presents with  . Medical Management of Chronic Issues    2 week follow-up on blood pressure    HPI: Patient is a 85 y.o. female seen today for medical management of hypertension. Two weeks ago, her losartan was discontinued and irbesartan was started. She has taken the irbesartan for the last 10 days every morning. She is also taking her other antihypertensive medications as prescribed. In an effort to reduce her blood pressure, she has stopped eating soup and reduced the amount of processed deli meat in her diet.   Her high blood pressure has increased her anxiety. She is taking her xanax and zoloft daily. She also tries to take part in activities that calm her like reading or walking.   She has not brought a bp log with her today.   She is requesting her flu vaccination today.    Past Medical History:  Diagnosis Date  . Allergic rhinitis due to pollen   . Anxiety state, unspecified   . Calculus of kidney   . Cataract   . Diaphragmatic hernia without mention of obstruction or gangrene   . Diffuse cystic mastopathy   . Dizziness and giddiness   . GERD (gastroesophageal reflux disease)   . Hiatal hernia   . Insomnia, unspecified   . Insomnia, unspecified   . Lumbago   . Macular degeneration (senile) of retina, unspecified   . Open wound of toe(s), without mention of complication   . Osteoarthrosis, unspecified whether generalized or  localized, unspecified site   . Other and unspecified hyperlipidemia   . Personal history of fall   . Sciatica   . Sebaceous cyst   . Senile osteoporosis   . Unspecified constipation   . Unspecified essential hypertension   . Unspecified hereditary and idiopathic peripheral neuropathy   . Unspecified hypothyroidism   . Unspecified tinnitus     Past Surgical History:  Procedure Laterality Date  . CATARACT EXTRACTION, BILATERAL    . CHOLECYSTECTOMY  07/25/2007  . TONSILLECTOMY AND ADENOIDECTOMY Bilateral 1943    Allergies  Allergen Reactions  . Ambien [Zolpidem Tartrate]     hallucination  . Bactrim [Sulfamethoxazole-Trimethoprim]     Unknown reaction   . Morphine And Related Nausea And Vomiting  . Zithromax [Azithromycin]     Not effective      Outpatient Encounter Medications as of 04/08/2019  Medication Sig  . acetaminophen (TYLENOL) 500 MG tablet Take 500 mg by mouth every 4 (four) hours as needed.  . ALPRAZolam (XANAX) 0.5 MG tablet TAKE 1 TABLET BY MOUTH 3 TIMES A DAY AS NEEDED FOR ANXIETY  . doxazosin (CARDURA) 4 MG tablet Take 1 tablet (4 mg total) by mouth daily.  . indapamide (LOZOL) 1.25 MG tablet Take 1 tablet (1.25 mg total) by mouth every other day.  . irbesartan (AVAPRO) 150 MG tablet Take 1 tablet (150 mg total) by mouth daily.  Marland Kitchen KLOR-CON M20 20 MEQ tablet  TAKE 1 TABLET BY MOUTH EVERY DAY  . levothyroxine (SYNTHROID) 100 MCG tablet TAKE 1 TABLET BY MOUTH EVERY DAY BEFORE BREAKFAST  . loratadine (CLARITIN) 10 MG tablet Take 10 mg by mouth daily.   . metoprolol succinate (TOPROL-XL) 100 MG 24 hr tablet TAKE 1 TABLET BY MOUTH EVERY DAY WITH A MEAL TO CONTROL BLOOD PRESSURE.  . pantoprazole (PROTONIX) 40 MG tablet TAKE 1 TABLET BY MOUTH EVERY DAY  . polyethylene glycol (MIRALAX / GLYCOLAX) packet Take 17 g by mouth daily.   Marland Kitchen. Propylene Glycol (SYSTANE BALANCE) 0.6 % SOLN Place 1 drop into both eyes 2 (two) times daily.  . sertraline (ZOLOFT) 50 MG tablet TAKE 1  TABLET BY MOUTH EVERY DAY TO CALM NERVES  . temazepam (RESTORIL) 30 MG capsule TAKE 1 CAPSULE BY MOUTH AT BEDTIME AS NEEDED FOR SLEEP   No facility-administered encounter medications on file as of 04/08/2019.     Review of Systems:  Review of Systems  Constitutional: Negative for activity change and fatigue.  Respiratory: Negative for cough, shortness of breath and wheezing.   Cardiovascular: Negative for chest pain and leg swelling.  Neurological: Negative for dizziness, syncope and headaches.  Psychiatric/Behavioral: Negative for dysphoric mood. The patient is nervous/anxious.     Health Maintenance  Topic Date Due  . INFLUENZA VACCINE  03/16/2019  . TETANUS/TDAP  06/16/2027  . DEXA SCAN  Completed  . PNA vac Low Risk Adult  Completed    Physical Exam: Vitals:   04/08/19 1028  BP: (!) 180/90  Pulse: 65  Temp: 98.1 F (36.7 C)  TempSrc: Oral  SpO2: 95%  Weight: 112 lb (50.8 kg)  Height: 5\' 3"  (1.6 m)   Body mass index is 19.84 kg/m. Physical Exam Vitals signs reviewed.  Constitutional:      General: She is not in acute distress.    Appearance: Normal appearance. She is normal weight.  HENT:     Head: Normocephalic.  Cardiovascular:     Rate and Rhythm: Normal rate and regular rhythm.     Pulses: Normal pulses.     Heart sounds: Normal heart sounds. No murmur.  Pulmonary:     Effort: Pulmonary effort is normal.     Breath sounds: Normal breath sounds.  Musculoskeletal:     Right lower leg: No edema.  Skin:    Capillary Refill: Capillary refill takes less than 2 seconds.  Neurological:     General: No focal deficit present.     Mental Status: She is alert and oriented to person, place, and time.     Labs reviewed: Basic Metabolic Panel: Recent Labs    07/23/18 1020 12/05/18 1011  NA 132* 132*  K 3.6 3.3*  CL 88* 88*  CO2 39* 36*  GLUCOSE 98 109*  BUN 18 20  CREATININE 0.76 0.83  CALCIUM 9.7 9.8  TSH 6.71* 4.73*   Liver Function Tests: Recent  Labs    07/23/18 1020 12/05/18 1011  AST 15 14  ALT 10 9  BILITOT 0.5 0.6  PROT 6.4 6.5   No results for input(s): LIPASE, AMYLASE in the last 8760 hours. No results for input(s): AMMONIA in the last 8760 hours. CBC: Recent Labs    07/23/18 1020 12/05/18 1011  WBC 3.7* 4.1  NEUTROABS 2,527 2,968  HGB 12.9 13.3  HCT 39.1 38.9  MCV 88.1 87.2  PLT 143 141   Lipid Panel: No results for input(s): CHOL, HDL, LDLCALC, TRIG, CHOLHDL, LDLDIRECT in the last 8760 hours.  Lab Results  Component Value Date   HGBA1C  07/23/2007    5.7 (NOTE)   The ADA recommends the following therapeutic goals for glycemic   control related to Hgb A1C measurement:   Goal of Therapy:   < 7.0% Hgb A1C   Action Suggested:  > 8.0% Hgb A1C   Ref:  Diabetes Care, 22, Suppl. 1, 1999    Procedures since last visit: No results found.  Assessment/Plan 1. Need for influenza vaccination -  Administer Flu Vaccine QUAD High Dose(Fluad)  2. Essential hypertension - rechecked bp 154/94, no symptoms suggesting high or low blood pressure - home bp readings not available this visit - recommend having medical staff take weekly bp reading with her personal cuff - continue to follow low sodium diet - continue current medications prescribed - please bring blood pressure cuff at next follow up appointment.       Labs/tests ordered:  none Next appt:  2 week follow up

## 2019-04-08 NOTE — Patient Instructions (Addendum)
Please sign up to get your blood pressures taken weekly with your pediatric cuff by staff.  You should sit 20 minutes before the blood pressure is taken. They can send me the results after 4 weeks.  Goal is less than 150/90.  DASH Eating Plan DASH stands for "Dietary Approaches to Stop Hypertension." The DASH eating plan is a healthy eating plan that has been shown to reduce high blood pressure (hypertension). It may also reduce your risk for type 2 diabetes, heart disease, and stroke. The DASH eating plan may also help with weight loss. What are tips for following this plan?  General guidelines  Avoid eating more than 2,300 mg (milligrams) of salt (sodium) a day. If you have hypertension, you may need to reduce your sodium intake to 1,500 mg a day.  Limit alcohol intake to no more than 1 drink a day for nonpregnant women and 2 drinks a day for men. One drink equals 12 oz of beer, 5 oz of wine, or 1 oz of hard liquor.  Work with your health care provider to maintain a healthy body weight or to lose weight. Ask what an ideal weight is for you.  Get at least 30 minutes of exercise that causes your heart to beat faster (aerobic exercise) most days of the week. Activities may include walking, swimming, or biking.  Work with your health care provider or diet and nutrition specialist (dietitian) to adjust your eating plan to your individual calorie needs. Reading food labels   Check food labels for the amount of sodium per serving. Choose foods with less than 5 percent of the Daily Value of sodium. Generally, foods with less than 300 mg of sodium per serving fit into this eating plan.  To find whole grains, look for the word "whole" as the first word in the ingredient list. Shopping  Buy products labeled as "low-sodium" or "no salt added."  Buy fresh foods. Avoid canned foods and premade or frozen meals. Cooking  Avoid adding salt when cooking. Use salt-free seasonings or herbs instead of  table salt or sea salt. Check with your health care provider or pharmacist before using salt substitutes.  Do not fry foods. Cook foods using healthy methods such as baking, boiling, grilling, and broiling instead.  Cook with heart-healthy oils, such as olive, canola, soybean, or sunflower oil. Meal planning  Eat a balanced diet that includes: ? 5 or more servings of fruits and vegetables each day. At each meal, try to fill half of your plate with fruits and vegetables. ? Up to 6-8 servings of whole grains each day. ? Less than 6 oz of lean meat, poultry, or fish each day. A 3-oz serving of meat is about the same size as a deck of cards. One egg equals 1 oz. ? 2 servings of low-fat dairy each day. ? A serving of nuts, seeds, or beans 5 times each week. ? Heart-healthy fats. Healthy fats called Omega-3 fatty acids are found in foods such as flaxseeds and coldwater fish, like sardines, salmon, and mackerel.  Limit how much you eat of the following: ? Canned or prepackaged foods. ? Food that is high in trans fat, such as fried foods. ? Food that is high in saturated fat, such as fatty meat. ? Sweets, desserts, sugary drinks, and other foods with added sugar. ? Full-fat dairy products.  Do not salt foods before eating.  Try to eat at least 2 vegetarian meals each week.  Eat more home-cooked food and  less restaurant, buffet, and fast food.  When eating at a restaurant, ask that your food be prepared with less salt or no salt, if possible. What foods are recommended? The items listed may not be a complete list. Talk with your dietitian about what dietary choices are best for you. Grains Whole-grain or whole-wheat bread. Whole-grain or whole-wheat pasta. Brown rice. [place]. Bulgur. Whole-grain and low-sodium cereals. Pita bread. Low-fat, low-sodium crackers. Whole-wheat flour tortillas. Vegetables Fresh or frozen vegetables (raw, steamed, roasted, or grilled). Low-sodium or  reduced-sodium tomato and vegetable juice. Low-sodium or reduced-sodium tomato sauce and tomato paste. Low-sodium or reduced-sodium canned vegetables. Fruits All fresh, dried, or frozen fruit. Canned fruit in natural juice (without added sugar). Meat and other protein foods Skinless chicken or [place]. Ground chicken or [place]. Pork with fat trimmed off. Fish and seafood. Egg whites. Dried beans, peas, or lentils. Unsalted nuts, nut butters, and seeds. Unsalted canned beans. Lean cuts of beef with fat trimmed off. Low-sodium, lean deli meat. Dairy Low-fat (1%) or fat-free (skim) milk. Fat-free, low-fat, or reduced-fat cheeses. Nonfat, low-sodium ricotta or cottage cheese. Low-fat or nonfat yogurt. Low-fat, low-sodium cheese. Fats and oils Soft margarine without trans fats. Vegetable oil. Low-fat, reduced-fat, or light mayonnaise and salad dressings (reduced-sodium). Canola, safflower, olive, soybean, and sunflower oils. Avocado. Seasoning and other foods Herbs. Spices. Seasoning mixes without salt. Unsalted popcorn and pretzels. Fat-free sweets. What foods are not recommended? The items listed may not be a complete list. Talk with your dietitian about what dietary choices are best for you. Grains Baked goods made with fat, such as croissants, muffins, or some breads. Dry pasta or rice meal packs. Vegetables Creamed or fried vegetables. Vegetables in a cheese sauce. Regular canned vegetables (not low-sodium or reduced-sodium). Regular canned tomato sauce and paste (not low-sodium or reduced-sodium). Regular tomato and vegetable juice (not low-sodium or reduced-sodium). Angie Fava. Olives. Fruits Canned fruit in a light or heavy syrup. Fried fruit. Fruit in cream or butter sauce. Meat and other protein foods Fatty cuts of meat. Ribs. Fried meat. Berniece Salines. Sausage. Bologna and other processed lunch meats. Salami. Fatback. Hotdogs. Bratwurst. Salted nuts and seeds. Canned beans with added salt. Canned or  smoked fish. Whole eggs or egg yolks. Chicken or [place] with skin. Dairy Whole or 2% milk, cream, and half-and-half. Whole or full-fat cream cheese. Whole-fat or sweetened yogurt. Full-fat cheese. Nondairy creamers. Whipped toppings. Processed cheese and cheese spreads. Fats and oils Butter. Stick margarine. Lard. Shortening. Ghee. Bacon fat. Tropical oils, such as coconut, palm kernel, or palm oil. Seasoning and other foods Salted popcorn and pretzels. Onion salt, garlic salt, seasoned salt, table salt, and sea salt. Worcestershire sauce. Tartar sauce. Barbecue sauce. Teriyaki sauce. Soy sauce, including reduced-sodium. Steak sauce. Canned and packaged gravies. Fish sauce. Oyster sauce. Cocktail sauce. Horseradish that you find on the shelf. Ketchup. Mustard. Meat flavorings and tenderizers. Bouillon cubes. Hot sauce and Tabasco sauce. Premade or packaged marinades. Premade or packaged taco seasonings. Relishes. Regular salad dressings. Where to find more information:  National Heart, Lung, and [place]: https://[place]-eaton.com/  American Heart Association: www.heart.org Summary  The DASH eating plan is a healthy eating plan that has been shown to reduce high blood pressure (hypertension). It may also reduce your risk for type 2 diabetes, heart disease, and stroke.  With the DASH eating plan, you should limit salt (sodium) intake to 2,300 mg a day. If you have hypertension, you may need to reduce your sodium intake to 1,500 mg a day.  When on the DASH eating plan, aim to eat more fresh fruits and vegetables, whole grains, lean proteins, low-fat dairy, and heart-healthy fats.  Work with your health care provider or diet and nutrition specialist (dietitian) to adjust your eating plan to your individual calorie needs. This information is not intended to replace advice given to you by your health care provider. Make sure you discuss any questions you have with your health care provider. Document  Released: 07/21/2011 Document Revised: 07/14/2017 Document Reviewed: 07/25/2016 Elsevier Patient Education  2020 [employer].

## 2019-04-13 ENCOUNTER — Other Ambulatory Visit: Payer: Self-pay | Admitting: Internal Medicine

## 2019-04-13 DIAGNOSIS — F411 Generalized anxiety disorder: Secondary | ICD-10-CM

## 2019-04-13 DIAGNOSIS — I10 Essential (primary) hypertension: Secondary | ICD-10-CM

## 2019-04-13 DIAGNOSIS — G4700 Insomnia, unspecified: Secondary | ICD-10-CM

## 2019-04-15 ENCOUNTER — Other Ambulatory Visit: Payer: Self-pay | Admitting: Internal Medicine

## 2019-04-15 DIAGNOSIS — G4700 Insomnia, unspecified: Secondary | ICD-10-CM

## 2019-04-15 NOTE — Telephone Encounter (Signed)
Last filled 03/12/2019

## 2019-04-15 NOTE — Telephone Encounter (Signed)
Patient requested refill. Phoned to pharmacy.  

## 2019-04-17 ENCOUNTER — Other Ambulatory Visit: Payer: Self-pay | Admitting: *Deleted

## 2019-04-17 DIAGNOSIS — I10 Essential (primary) hypertension: Secondary | ICD-10-CM

## 2019-04-17 MED ORDER — IRBESARTAN 150 MG PO TABS: 150.0000 mg | ORAL_TABLET | Freq: Every day | ORAL | 3 refills | Status: DC

## 2019-04-17 NOTE — Telephone Encounter (Signed)
Patient does not use [facility] and wants Rx sent to [facility]

## 2019-05-02 ENCOUNTER — Ambulatory Visit (INDEPENDENT_AMBULATORY_CARE_PROVIDER_SITE_OTHER): Payer: Medicare Other | Admitting: Internal Medicine

## 2019-05-02 ENCOUNTER — Other Ambulatory Visit: Payer: Self-pay | Admitting: Family

## 2019-05-02 ENCOUNTER — Other Ambulatory Visit: Payer: Self-pay

## 2019-05-02 ENCOUNTER — Encounter: Payer: Self-pay | Admitting: Internal Medicine

## 2019-05-02 VITALS — BP 154/82 | HR 56 | Temp 97.9°F | Ht 63.0 in | Wt 110.4 lb

## 2019-05-02 DIAGNOSIS — F411 Generalized anxiety disorder: Secondary | ICD-10-CM

## 2019-05-02 DIAGNOSIS — I10 Essential (primary) hypertension: Secondary | ICD-10-CM | POA: Diagnosis not present

## 2019-05-02 DIAGNOSIS — R634 Abnormal weight loss: Secondary | ICD-10-CM

## 2019-05-02 NOTE — Progress Notes (Signed)
Location:  [facility]   Advanced Directives 05/02/2019  Does Patient Have a Medical Advance Directive? Yes  Type of Advance Directive Out of facility DNR (pink MOST or yellow form)  Does patient want to make changes to medical advance directive? No - Patient declined  Copy of Healthcare Power of Attorney in Chart? -  Would patient like information on creating a medical advance directive? -  Pre-existing out of facility DNR order (yellow form or pink MOST form) Yellow form placed in chart (order not valid for inpatient use)     Chief Complaint  Patient presents with  . Follow-up    Blood pressure check    HPI: Patient is a 85 y.o. female seen today for follow up of high blood pressure.   She presents with a log of BP's. At times she would take her blood pressure, and [facility] would take them. She is taking her cardura, avapro, metoprolol, and lozol as prescribed. Continues to follow a DASH diet. Denies any dizziness or headaches.   She reports less anxiety from last visit. She continues to take her xanax. When she feels increased anxiety she tries to keep herself busy and watch tv or read.   Concerned that she has lost two pounds from last visit. She strongly expresses how much she dislikes the food at [facility]. Goes to [place] a few times a week and has started using organic protein shakes in between meals.     Past Medical History:  Diagnosis Date  . Allergic rhinitis due to pollen   . Anxiety state, unspecified   . Calculus of kidney   . Cataract   . Diaphragmatic hernia without mention of obstruction or gangrene   . Diffuse cystic mastopathy   . Dizziness and giddiness   . GERD (gastroesophageal reflux disease)   . Hiatal hernia   . Insomnia, unspecified   . Insomnia, unspecified   . Lumbago   . Macular degeneration (senile) of retina, unspecified   . Open wound of toe(s), without mention of complication   . Osteoarthrosis, unspecified whether generalized  or localized, unspecified site   . Other and unspecified hyperlipidemia   . Personal history of fall   . Sciatica   . Sebaceous cyst   . Senile osteoporosis   . Unspecified constipation   . Unspecified essential hypertension   . Unspecified hereditary and idiopathic peripheral neuropathy   . Unspecified hypothyroidism   . Unspecified tinnitus     Past Surgical History:  Procedure Laterality Date  . CATARACT EXTRACTION, BILATERAL    . CHOLECYSTECTOMY  07/25/2007  . TONSILLECTOMY AND ADENOIDECTOMY Bilateral 1943    Allergies  Allergen Reactions  . Ambien [Zolpidem Tartrate]     hallucination  . Bactrim [Sulfamethoxazole-Trimethoprim]     Unknown reaction   . Morphine And Related Nausea And Vomiting  . Zithromax [Azithromycin]     Not effective      Outpatient Encounter Medications as of 05/02/2019  Medication Sig  . acetaminophen (TYLENOL) 500 MG tablet Take 500 mg by mouth every 4 (four) hours as needed.  . ALPRAZolam (XANAX) 0.5 MG tablet TAKE 1 TABLET BY MOUTH 3 TIMES A DAY AS NEEDED FOR ANXIETY  . doxazosin (CARDURA) 4 MG tablet Take 1 tablet (4 mg total) by mouth daily.  . indapamide (LOZOL) 1.25 MG tablet Take 1 tablet (1.25 mg total) by mouth every other day.  . irbesartan (AVAPRO) 150 MG tablet Take 1 tablet (150 mg total) by mouth daily.  .Marland Kitchen  KLOR-CON M20 20 MEQ tablet TAKE 1 TABLET BY MOUTH EVERY DAY  . levothyroxine (SYNTHROID) 100 MCG tablet TAKE 1 TABLET BY MOUTH EVERY DAY BEFORE BREAKFAST  . loratadine (CLARITIN) 10 MG tablet Take 10 mg by mouth daily.   . metoprolol succinate (TOPROL-XL) 100 MG 24 hr tablet TAKE 1 TABLET BY MOUTH EVERY DAY WITH A MEAL TO CONTROL BLOOD PRESSURE.  . pantoprazole (PROTONIX) 40 MG tablet TAKE 1 TABLET BY MOUTH EVERY DAY  . polyethylene glycol (MIRALAX / GLYCOLAX) packet Take 17 g by mouth daily.   Marland Kitchen Propylene Glycol (SYSTANE BALANCE) 0.6 % SOLN Place 1 drop into both eyes 2 (two) times daily.  . sertraline (ZOLOFT) 50 MG tablet TAKE  1 TABLET BY MOUTH EVERY DAY TO CALM NERVES.  Marland Kitchen temazepam (RESTORIL) 30 MG capsule TAKE 1 CAPSULE BY MOUTH AT BEDTIME AS NEEDED FOR SLEEP   No facility-administered encounter medications on file as of 05/02/2019.     Review of Systems:  Review of Systems  Constitutional: Negative for activity change and fatigue.  Respiratory: Negative for cough, shortness of breath and wheezing.   Cardiovascular: Negative for chest pain, palpitations and leg swelling.  Neurological: Negative for dizziness, light-headedness and headaches.  Psychiatric/Behavioral: Negative for dysphoric mood and sleep disturbance. The patient is nervous/anxious.   All other systems reviewed and are negative.   Health Maintenance  Topic Date Due  . TETANUS/TDAP  06/16/2027  . INFLUENZA VACCINE  Completed  . DEXA SCAN  Completed  . PNA vac Low Risk Adult  Completed    Physical Exam: Vitals:   05/02/19 1130  BP: (!) 154/82  Pulse: (!) 56  Temp: 97.9 F (36.6 C)  SpO2: 95%  Weight: 110 lb 6.4 oz (50.1 kg)  Height: 5\' 3"  (1.6 m)   Body mass index is 19.56 kg/m. Physical Exam Vitals signs reviewed.  Constitutional:      General: She is not in acute distress.    Appearance: Normal appearance. She is normal weight.  HENT:     Head: Normocephalic.  Cardiovascular:     Rate and Rhythm: Normal rate and regular rhythm.     Pulses: Normal pulses.     Heart sounds: Normal heart sounds. No murmur.  Pulmonary:     Effort: Pulmonary effort is normal. No respiratory distress.     Breath sounds: Normal breath sounds. No wheezing.  Musculoskeletal:     Right lower leg: No edema.     Left lower leg: No edema.  Skin:    Capillary Refill: Capillary refill takes less than 2 seconds.  Neurological:     General: No focal deficit present.     Mental Status: She is alert and oriented to person, place, and time. Mental status is at baseline.  Psychiatric:        Mood and Affect: Mood normal.        Behavior: Behavior  normal.        Thought Content: Thought content normal.        Judgment: Judgment normal.     Labs reviewed: Basic Metabolic Panel: Recent Labs    07/23/18 1020 12/05/18 1011  NA 132* 132*  K 3.6 3.3*  CL 88* 88*  CO2 39* 36*  GLUCOSE 98 109*  BUN 18 20  CREATININE 0.76 0.83  CALCIUM 9.7 9.8  TSH 6.71* 4.73*   Liver Function Tests: Recent Labs    07/23/18 1020 12/05/18 1011  AST 15 14  ALT 10 9  BILITOT 0.5  0.6  PROT 6.4 6.5   No results for input(s): LIPASE, AMYLASE in the last 8760 hours. No results for input(s): AMMONIA in the last 8760 hours. CBC: Recent Labs    07/23/18 1020 12/05/18 1011  WBC 3.7* 4.1  NEUTROABS 2,527 2,968  HGB 12.9 13.3  HCT 39.1 38.9  MCV 88.1 87.2  PLT 143 141   Lipid Panel: No results for input(s): CHOL, HDL, LDLCALC, TRIG, CHOLHDL, LDLDIRECT in the last 8760 hours. Lab Results  Component Value Date   HGBA1C  07/23/2007    5.7 (NOTE)   The ADA recommends the following therapeutic goals for glycemic   control related to Hgb A1C measurement:   Goal of Therapy:   < 7.0% Hgb A1C   Action Suggested:  > 8.0% Hgb A1C   Ref:  Diabetes Care, 22, Suppl. 1, 1999    Procedures since last visit: No results found.  Assessment/Plan 1. Essential hypertension - blood pressures taken at home averaged <150/90- at goal for now - she does not display symptoms of headache, blurred vision or hypotension - continue current cardiac medications - continue DASH diet - complete blood count with platelets- future - complete metabolic panel with GFR- future  2. Loss of weight - has lost two pounds since last visit - recommend protein shakes twice a daily between meals  3. Anxiety state - improved since blood pressures have improved - continue to take xanax as needed - recommend relaxing activities when anxiety occurrs   Labs/tests ordered: complete blood count with platelets, complete metabolic panel with GFR, TSH Next appt:  4 month follow  up

## 2019-05-02 NOTE — Telephone Encounter (Signed)
Last filled on 04/01/2019 in [facility], RX request sent to [employer], DO to review [place] Database and approve if necessary

## 2019-05-02 NOTE — Patient Instructions (Signed)
You may take two supplement shakes per day if you miss a meal.    Your blood pressures are much better!  No changes needed today.

## 2019-05-07 DIAGNOSIS — H353212 Exudative age-related macular degeneration, right eye, with inactive choroidal neovascularization: Secondary | ICD-10-CM | POA: Diagnosis not present

## 2019-05-07 DIAGNOSIS — H353124 Nonexudative age-related macular degeneration, left eye, advanced atrophic with subfoveal involvement: Secondary | ICD-10-CM | POA: Diagnosis not present

## 2019-05-07 DIAGNOSIS — H353113 Nonexudative age-related macular degeneration, right eye, advanced atrophic without subfoveal involvement: Secondary | ICD-10-CM | POA: Diagnosis not present

## 2019-05-07 DIAGNOSIS — H353221 Exudative age-related macular degeneration, left eye, with active choroidal neovascularization: Secondary | ICD-10-CM | POA: Diagnosis not present

## 2019-05-12 ENCOUNTER — Other Ambulatory Visit: Payer: Self-pay | Admitting: Internal Medicine

## 2019-05-12 DIAGNOSIS — G4700 Insomnia, unspecified: Secondary | ICD-10-CM

## 2019-05-13 MED ORDER — TEMAZEPAM 30 MG PO CAPS: ORAL_CAPSULE | ORAL | 0 refills | Status: DC

## 2019-05-13 NOTE — Addendum Note (Signed)
Addended by: Logan Bores on: 05/13/2019 01:57 PM   Modules accepted: Orders

## 2019-05-31 ENCOUNTER — Other Ambulatory Visit: Payer: Self-pay | Admitting: Internal Medicine

## 2019-05-31 DIAGNOSIS — F411 Generalized anxiety disorder: Secondary | ICD-10-CM

## 2019-05-31 NOTE — Telephone Encounter (Signed)
Last filled in Epic 05/02/2019

## 2019-06-10 ENCOUNTER — Other Ambulatory Visit: Payer: Self-pay | Admitting: Internal Medicine

## 2019-06-10 DIAGNOSIS — G4700 Insomnia, unspecified: Secondary | ICD-10-CM

## 2019-06-10 NOTE — Telephone Encounter (Signed)
Last refill verified in [place] database 05/13/19. Last appointment was 05/02/2019 and next appointment is 09/02/2019

## 2019-06-25 DIAGNOSIS — H353113 Nonexudative age-related macular degeneration, right eye, advanced atrophic without subfoveal involvement: Secondary | ICD-10-CM | POA: Diagnosis not present

## 2019-06-25 DIAGNOSIS — H353221 Exudative age-related macular degeneration, left eye, with active choroidal neovascularization: Secondary | ICD-10-CM | POA: Diagnosis not present

## 2019-06-25 DIAGNOSIS — H353212 Exudative age-related macular degeneration, right eye, with inactive choroidal neovascularization: Secondary | ICD-10-CM | POA: Diagnosis not present

## 2019-06-25 DIAGNOSIS — H353124 Nonexudative age-related macular degeneration, left eye, advanced atrophic with subfoveal involvement: Secondary | ICD-10-CM | POA: Diagnosis not present

## 2019-06-26 ENCOUNTER — Ambulatory Visit (INDEPENDENT_AMBULATORY_CARE_PROVIDER_SITE_OTHER): Payer: Medicare Other | Admitting: Otolaryngology

## 2019-07-03 ENCOUNTER — Encounter (INDEPENDENT_AMBULATORY_CARE_PROVIDER_SITE_OTHER): Payer: Self-pay | Admitting: Otolaryngology

## 2019-07-03 ENCOUNTER — Other Ambulatory Visit: Payer: Self-pay

## 2019-07-03 ENCOUNTER — Ambulatory Visit (INDEPENDENT_AMBULATORY_CARE_PROVIDER_SITE_OTHER): Payer: Medicare Other | Admitting: Otolaryngology

## 2019-07-03 VITALS — Temp 97.3°F

## 2019-07-03 DIAGNOSIS — H6123 Impacted cerumen, bilateral: Secondary | ICD-10-CM

## 2019-07-03 NOTE — Progress Notes (Signed)
HPI: [name] is a 85 y.o. female who presents for evaluation of wax buildup which is worse on the right side as the side is partially obstructed and tends to pop and crack.  No pain or discomfort..  Past Medical History:  Diagnosis Date  . Allergic rhinitis due to pollen   . Anxiety state, unspecified   . Calculus of kidney   . Cataract   . Diaphragmatic hernia without mention of obstruction or gangrene   . Diffuse cystic mastopathy   . Dizziness and giddiness   . GERD (gastroesophageal reflux disease)   . Hiatal hernia   . Insomnia, unspecified   . Insomnia, unspecified   . Lumbago   . Macular degeneration (senile) of retina, unspecified   . Open wound of toe(s), without mention of complication   . Osteoarthrosis, unspecified whether generalized or localized, unspecified site   . Other and unspecified hyperlipidemia   . Personal history of fall   . Sciatica   . Sebaceous cyst   . Senile osteoporosis   . Unspecified constipation   . Unspecified essential hypertension   . Unspecified hereditary and idiopathic peripheral neuropathy   . Unspecified hypothyroidism   . Unspecified tinnitus    Past Surgical History:  Procedure Laterality Date  . CATARACT EXTRACTION, BILATERAL    . CHOLECYSTECTOMY  07/25/2007  . TONSILLECTOMY AND ADENOIDECTOMY Bilateral 1943   Social History   Socioeconomic History  . Marital status: Married    Spouse name: Not on file  . Number of children: 2  . Years of education: Not on file  . Highest education level: Not on file  Occupational History  . Occupation: retired  Engineer, production  . Financial resource strain: Not hard at all  . Food insecurity    Worry: Never true    Inability: Never true  . Transportation needs    Medical: No    Non-medical: No  Tobacco Use  . Smoking status: Never Smoker  . Smokeless tobacco: Never Used  Substance and Sexual Activity  . Alcohol use: No    Alcohol/week: 0.0 standard drinks  . Drug use: No  .  Sexual activity: Never  Lifestyle  . Physical activity    Days per week: 7 days    Minutes per session: 30 min  . Stress: Only a little  Relationships  . Social connections    Talks on phone: More than three times a week    Gets together: More than three times a week    Attends religious service: Never    Active member of club or organization: No    Attends meetings of clubs or organizations: Never    Relationship status: Married  Other Topics Concern  . Not on file  Social History Narrative  . Not on file   Family History  Problem Relation Age of Onset  . Hypertension Brother   . Fibromyalgia Daughter   . Hypertension Son   . Hypertension Brother   . Hyperlipidemia Brother   . Breast cancer Neg Hx    Allergies  Allergen Reactions  . Ambien [Zolpidem Tartrate]     hallucination  . Bactrim [Sulfamethoxazole-Trimethoprim]     Unknown reaction   . Morphine And Related Nausea And Vomiting  . Zithromax [Azithromycin]     Not effective     Prior to Admission medications   Medication Sig Start Date End Date Taking? Authorizing Provider  acetaminophen (TYLENOL) 500 MG tablet Take 500 mg by mouth every 4 (four)  hours as needed.   Yes [provider]  ALPRAZolam (XANAX) 0.5 MG tablet TAKE 1 TABLET BY MOUTH THREE TIMES A DAY AS NEEDED FOR ANXIETY 05/31/19  Yes Reed, Tiffany L, DO  doxazosin (CARDURA) 4 MG tablet Take 1 tablet (4 mg total) by mouth daily. 02/06/19  Yes Reed, Tiffany L, DO  indapamide (LOZOL) 1.25 MG tablet Take 1 tablet (1.25 mg total) by mouth every other day. 12/24/18  Yes Reed, Tiffany L, DO  irbesartan (AVAPRO) 150 MG tablet Take 1 tablet (150 mg total) by mouth daily. 04/17/19  Yes Reed, Tiffany L, DO  KLOR-CON M20 20 MEQ tablet TAKE 1 TABLET BY MOUTH EVERY DAY 04/15/19  Yes Reed, Tiffany L, DO  levothyroxine (SYNTHROID) 100 MCG tablet TAKE 1 TABLET BY MOUTH EVERY DAY BEFORE BREAKFAST 01/23/19  Yes Reed, Tiffany L, DO  loratadine (CLARITIN) 10 MG tablet  Take 10 mg by mouth daily.    Yes [provider]  metoprolol succinate (TOPROL-XL) 100 MG 24 hr tablet TAKE 1 TABLET BY MOUTH EVERY DAY WITH A MEAL TO CONTROL BLOOD PRESSURE. 04/02/19  Yes Reed, Tiffany L, DO  pantoprazole (PROTONIX) 40 MG tablet TAKE 1 TABLET BY MOUTH EVERY DAY 04/02/19  Yes Reed, Tiffany L, DO  polyethylene glycol (MIRALAX / GLYCOLAX) packet Take 17 g by mouth daily.    Yes [provider]  Propylene Glycol (SYSTANE BALANCE) 0.6 % SOLN Place 1 drop into both eyes 2 (two) times daily.   Yes [provider]  sertraline (ZOLOFT) 50 MG tablet TAKE 1 TABLET BY MOUTH EVERY DAY TO CALM NERVES. 04/15/19  Yes Reed, Tiffany L, DO  temazepam (RESTORIL) 30 MG capsule TAKE 1 CAPSULE BY MOUTH AT BEDTIME AS NEEDED FOR SLEEP 06/10/19  Yes Reed, Tiffany L, DO     Positive ROS: Otherwise negative.  All other systems have been reviewed and were otherwise negative with the exception of those mentioned in the HPI and as above.  Physical Exam: Constitutional: Alert, well-appearing, no acute distress Ears: External ears without lesions or tenderness. Ear canals had moderate cerumen on both sides right side worse than left.  This was cleaned with curettes and forceps.  TMs were clear bilaterally. Nasal: External nose without lesions. Clear nasal passages Oral: Oropharynx clear. Neck: No palpable adenopathy or masses Respiratory: Breathing comfortably  Skin: No facial/neck lesions or rash noted.  Cerumen impaction removal  Date/Time: 07/03/2019 11:03 AM Performed by: Rozetta Nunnery, MD Authorized by: Rozetta Nunnery, MD   Consent:    Consent obtained:  Verbal   Consent given by:  Patient   Risks discussed:  Pain and bleeding Procedure details:    Location:  L ear and R ear   Procedure type: curette and forceps   Post-procedure details:    Inspection:  TM intact and canal normal   Hearing quality:  Improved   Patient tolerance of procedure:   Tolerated well, no immediate complications    Assessment: Cerumen impaction  Plan: We will follow-up as needed  Radene Journey, MD

## 2019-07-06 ENCOUNTER — Other Ambulatory Visit: Payer: Self-pay | Admitting: Internal Medicine

## 2019-07-06 DIAGNOSIS — F411 Generalized anxiety disorder: Secondary | ICD-10-CM

## 2019-07-08 NOTE — Telephone Encounter (Signed)
.  left message to have patient return my call. Pt needs to schedule appt to update her pain contract.

## 2019-07-08 NOTE — Telephone Encounter (Signed)
Scheduled patient an appointment to come in to update controlled medication contract. Verified in [place] database last refill was 05/31/2019 [facility]  Last appointment 05/02/2019 and next appointment is 07/19/2019  Routing to provider for approval.

## 2019-07-19 ENCOUNTER — Ambulatory Visit (INDEPENDENT_AMBULATORY_CARE_PROVIDER_SITE_OTHER): Payer: Medicare Other | Admitting: Internal Medicine

## 2019-07-19 ENCOUNTER — Encounter: Payer: Self-pay | Admitting: Internal Medicine

## 2019-07-19 ENCOUNTER — Other Ambulatory Visit: Payer: Self-pay

## 2019-07-19 VITALS — BP 120/76 | HR 67 | Temp 97.3°F | Ht 63.0 in | Wt 115.0 lb

## 2019-07-19 DIAGNOSIS — F411 Generalized anxiety disorder: Secondary | ICD-10-CM | POA: Diagnosis not present

## 2019-07-19 DIAGNOSIS — R634 Abnormal weight loss: Secondary | ICD-10-CM

## 2019-07-19 DIAGNOSIS — I10 Essential (primary) hypertension: Secondary | ICD-10-CM | POA: Diagnosis not present

## 2019-07-19 DIAGNOSIS — G4700 Insomnia, unspecified: Secondary | ICD-10-CM

## 2019-07-19 DIAGNOSIS — H3530 Unspecified macular degeneration: Secondary | ICD-10-CM

## 2019-07-19 NOTE — Progress Notes (Signed)
Location:  [facility] clinic Provider:  Onnika Siebel L. Renato Gails, D.O., C.M.D.  Code Status: DNR Goals of Care:  Advanced Directives 07/19/2019  Does Patient Have a Medical Advance Directive? Yes  Type of Advance Directive Living will;Out of facility DNR (pink MOST or yellow form)  Does patient want to make changes to medical advance directive? No - Patient declined  Copy of Healthcare Power of Attorney in Chart? -  Would patient like information on creating a medical advance directive? -  Pre-existing out of facility DNR order (yellow form or pink MOST form) -     Chief Complaint  Patient presents with  . Medical Management of Chronic Issues    Needs to update non-opioid contract.     HPI: Patient is a 85 y.o. female seen today for medical management of chronic diseases--here only to update non-opioid controlled substance contract for her xanax--uses 0.5mg  po tid for anxiety and has considerable anxiety that requires this medication.  She is still shaky and nervous.  Prior attempts at weaning have been unsuccessful. She is also on restoril at [facility] for sleep.  Wt up 4.5 lbs from sept.  She slid off the toilet and went down on the bathmat.  She had her ugg slippers on.  She got on her knees and pulled up using toilet.  He put his arms around her waist to get her up.  She opted not to use the life alert button.  She got a little sore in her quadriceps.    Her sight is getting worse.  The good one is declining now--it's turned dry (right).  She's getting a shot in the left eye peripherally now.  Her husband is helping her out and she helps him.  They get their regular saliva testing at [facility] and temp checks if they go to [place].  residents had revolted and gone home for thanksgiving so they've closed the dining room for a week.  BP seems stable now.  She's less worried about it.    Past Medical History:  Diagnosis Date  . Allergic rhinitis due to pollen   . Anxiety state, unspecified   .  Calculus of kidney   . Cataract   . Diaphragmatic hernia without mention of obstruction or gangrene   . Diffuse cystic mastopathy   . Dizziness and giddiness   . GERD (gastroesophageal reflux disease)   . Hiatal hernia   . Insomnia, unspecified   . Insomnia, unspecified   . Lumbago   . Macular degeneration (senile) of retina, unspecified   . Open wound of toe(s), without mention of complication   . Osteoarthrosis, unspecified whether generalized or localized, unspecified site   . Other and unspecified hyperlipidemia   . Personal history of fall   . Sciatica   . Sebaceous cyst   . Senile osteoporosis   . Unspecified constipation   . Unspecified essential hypertension   . Unspecified hereditary and idiopathic peripheral neuropathy   . Unspecified hypothyroidism   . Unspecified tinnitus     Past Surgical History:  Procedure Laterality Date  . CATARACT EXTRACTION, BILATERAL    . CHOLECYSTECTOMY  07/25/2007  . TONSILLECTOMY AND ADENOIDECTOMY Bilateral 1943    Allergies  Allergen Reactions  . Ambien [Zolpidem Tartrate]     hallucination  . Bactrim [Sulfamethoxazole-Trimethoprim]     Unknown reaction   . Morphine And Related Nausea And Vomiting  . Zithromax [Azithromycin]     Not effective      Outpatient Encounter Medications as of  07/19/2019  Medication Sig  . acetaminophen (TYLENOL) 500 MG tablet Take 500 mg by mouth every 4 (four) hours as needed.  . ALPRAZolam (XANAX) 0.5 MG tablet TAKE 1 TABLET BY MOUTH THREE TIMES A DAY AS NEEDED FOR ANXIETY  . doxazosin (CARDURA) 4 MG tablet Take 1 tablet (4 mg total) by mouth daily.  . indapamide (LOZOL) 1.25 MG tablet Take 1 tablet (1.25 mg total) by mouth every other day.  . irbesartan (AVAPRO) 150 MG tablet Take 1 tablet (150 mg total) by mouth daily.  Marland Kitchen. KLOR-CON M20 20 MEQ tablet TAKE 1 TABLET BY MOUTH EVERY DAY  . levothyroxine (SYNTHROID) 100 MCG tablet TAKE 1 TABLET BY MOUTH EVERY DAY BEFORE BREAKFAST  . loratadine  (CLARITIN) 10 MG tablet Take 10 mg by mouth daily.   . metoprolol succinate (TOPROL-XL) 100 MG 24 hr tablet TAKE 1 TABLET BY MOUTH EVERY DAY WITH A MEAL TO CONTROL BLOOD PRESSURE.  . pantoprazole (PROTONIX) 40 MG tablet TAKE 1 TABLET BY MOUTH EVERY DAY  . polyethylene glycol (MIRALAX / GLYCOLAX) packet Take 17 g by mouth daily.   Marland Kitchen. Propylene Glycol (SYSTANE BALANCE) 0.6 % SOLN Place 1 drop into both eyes 2 (two) times daily.  . sertraline (ZOLOFT) 50 MG tablet TAKE 1 TABLET BY MOUTH EVERY DAY TO CALM NERVES.  Marland Kitchen. temazepam (RESTORIL) 30 MG capsule TAKE 1 CAPSULE BY MOUTH AT BEDTIME AS NEEDED FOR SLEEP   No facility-administered encounter medications on file as of 07/19/2019.     Review of Systems:  Review of Systems  Constitutional: Negative for chills, fever and malaise/fatigue.  HENT: Positive for hearing loss.   Eyes: Positive for blurred vision.  Respiratory: Positive for wheezing. Negative for shortness of breath.        Attributes wheezing to gerd  Cardiovascular: Negative for chest pain and leg swelling.  Gastrointestinal: Negative for abdominal pain, blood in stool, constipation, diarrhea and melena.  Genitourinary: Negative for dysuria.  Musculoskeletal: Positive for falls. Negative for joint pain.  Skin: Negative for itching and rash.  Neurological: Negative for dizziness and loss of consciousness.  Psychiatric/Behavioral: Negative for depression and memory loss. The patient is nervous/anxious and has insomnia.     Health Maintenance  Topic Date Due  . TETANUS/TDAP  06/16/2027  . INFLUENZA VACCINE  Completed  . DEXA SCAN  Completed  . PNA vac Low Risk Adult  Completed    Physical Exam: Vitals:   07/19/19 1054  Weight: 115 lb (52.2 kg)  Height: 5\' 3"  (1.6 m)   Body mass index is 20.37 kg/m. Physical Exam Vitals signs reviewed.  Constitutional:      General: She is not in acute distress.    Appearance: Normal appearance. She is not toxic-appearing.  HENT:      Head: Normocephalic and atraumatic.     Ears:     Comments: Vision poor, uses glasses Eyes:     Comments: glasses  Cardiovascular:     Rate and Rhythm: Normal rate and regular rhythm.  Pulmonary:     Effort: Pulmonary effort is normal.     Breath sounds: Normal breath sounds.  Abdominal:     General: Bowel sounds are normal.  Musculoskeletal: Normal range of motion.     Comments: Uses cane  Skin:    General: Skin is warm and dry.  Neurological:     General: No focal deficit present.     Mental Status: She is alert and oriented to person, place, and time.  Psychiatric:  Mood and Affect: Mood normal.     Labs reviewed: Basic Metabolic Panel: Recent Labs    07/23/18 1020 12/05/18 1011  NA 132* 132*  K 3.6 3.3*  CL 88* 88*  CO2 39* 36*  GLUCOSE 98 109*  BUN 18 20  CREATININE 0.76 0.83  CALCIUM 9.7 9.8  TSH 6.71* 4.73*   Liver Function Tests: Recent Labs    07/23/18 1020 12/05/18 1011  AST 15 14  ALT 10 9  BILITOT 0.5 0.6  PROT 6.4 6.5   No results for input(s): LIPASE, AMYLASE in the last 8760 hours. No results for input(s): AMMONIA in the last 8760 hours. CBC: Recent Labs    07/23/18 1020 12/05/18 1011  WBC 3.7* 4.1  NEUTROABS 2,527 2,968  HGB 12.9 13.3  HCT 39.1 38.9  MCV 88.1 87.2  PLT 143 141   Lipid Panel: No results for input(s): CHOL, HDL, LDLCALC, TRIG, CHOLHDL, LDLDIRECT in the last 8760 hours. Lab Results  Component Value Date   HGBA1C  07/23/2007    5.7 (NOTE)   The ADA recommends the following therapeutic goals for glycemic   control related to Hgb A1C measurement:   Goal of Therapy:   < 7.0% Hgb A1C   Action Suggested:  > 8.0% Hgb A1C   Ref:  Diabetes Care, 22, Suppl. 1, 1999    Assessment/Plan 1. Anxiety state -continues on xanax for this and is also on zoloft to take the edge off, but we've never been able to lower the xanax--has taken for years  2. Insomnia, unspecified type -uses restoril for sleep and attempts to wean  this have also not been a success  3. Essential hypertension -bp at goal, cont same regimen, no recent dizziness concerns or new falls  4. Macular degeneration (senile) of retina -has declined further, now getting injections in a different eye and opposite eye has become dry  5. Loss of weight -due to dislike of food at [place] -weight now up 4.6 lbs since last time--is doing better with ensure plus instead of boost and trying to eat more  Labs/tests ordered:  No new added today, keep plan for january Next appt:  08/28/2019  Guss Farruggia L. Domnique Vanegas, D.O. [facility] [address], [place] [address] Cell Phone (Mon-Fri 8am-5pm):  [phone] On Call:  [phone] & follow prompts after 5pm & weekends Office Phone:  [phone] Office Fax:  [phone]

## 2019-07-31 DIAGNOSIS — Z20828 Contact with and (suspected) exposure to other viral communicable diseases: Secondary | ICD-10-CM | POA: Diagnosis not present

## 2019-07-31 DIAGNOSIS — Z1159 Encounter for screening for other viral diseases: Secondary | ICD-10-CM | POA: Diagnosis not present

## 2019-08-05 DIAGNOSIS — Z20828 Contact with and (suspected) exposure to other viral communicable diseases: Secondary | ICD-10-CM | POA: Diagnosis not present

## 2019-08-05 DIAGNOSIS — Z1159 Encounter for screening for other viral diseases: Secondary | ICD-10-CM | POA: Diagnosis not present

## 2019-08-06 ENCOUNTER — Other Ambulatory Visit: Payer: Self-pay | Admitting: Internal Medicine

## 2019-08-06 DIAGNOSIS — F411 Generalized anxiety disorder: Secondary | ICD-10-CM

## 2019-08-12 DIAGNOSIS — Z20828 Contact with and (suspected) exposure to other viral communicable diseases: Secondary | ICD-10-CM | POA: Diagnosis not present

## 2019-08-12 DIAGNOSIS — Z1159 Encounter for screening for other viral diseases: Secondary | ICD-10-CM | POA: Diagnosis not present

## 2019-08-19 DIAGNOSIS — Z1159 Encounter for screening for other viral diseases: Secondary | ICD-10-CM | POA: Diagnosis not present

## 2019-08-19 DIAGNOSIS — Z20828 Contact with and (suspected) exposure to other viral communicable diseases: Secondary | ICD-10-CM | POA: Diagnosis not present

## 2019-08-22 DIAGNOSIS — Z1159 Encounter for screening for other viral diseases: Secondary | ICD-10-CM | POA: Diagnosis not present

## 2019-08-22 DIAGNOSIS — Z20828 Contact with and (suspected) exposure to other viral communicable diseases: Secondary | ICD-10-CM | POA: Diagnosis not present

## 2019-08-26 DIAGNOSIS — Z1159 Encounter for screening for other viral diseases: Secondary | ICD-10-CM | POA: Diagnosis not present

## 2019-08-26 DIAGNOSIS — Z20828 Contact with and (suspected) exposure to other viral communicable diseases: Secondary | ICD-10-CM | POA: Diagnosis not present

## 2019-08-27 ENCOUNTER — Other Ambulatory Visit: Payer: Self-pay | Admitting: Internal Medicine

## 2019-08-27 DIAGNOSIS — I10 Essential (primary) hypertension: Secondary | ICD-10-CM

## 2019-08-28 ENCOUNTER — Other Ambulatory Visit: Payer: Medicare Other

## 2019-08-28 ENCOUNTER — Other Ambulatory Visit: Payer: Self-pay

## 2019-08-28 DIAGNOSIS — E876 Hypokalemia: Secondary | ICD-10-CM

## 2019-08-28 LAB — BASIC METABOLIC PANEL
BUN: 17 mg/dL (ref 7–25)
CO2: 37 mmol/L — ABNORMAL HIGH (ref 20–32)
Calcium: 9.6 mg/dL (ref 8.6–10.4)
Chloride: 87 mmol/L — ABNORMAL LOW (ref 98–110)
Creat: 0.71 mg/dL (ref 0.60–0.88)
Glucose, Bld: 97 mg/dL (ref 65–99)
Potassium: 3.6 mmol/L (ref 3.5–5.3)
Sodium: 130 mmol/L — ABNORMAL LOW (ref 135–146)

## 2019-08-29 DIAGNOSIS — Z1159 Encounter for screening for other viral diseases: Secondary | ICD-10-CM | POA: Diagnosis not present

## 2019-08-29 DIAGNOSIS — Z20828 Contact with and (suspected) exposure to other viral communicable diseases: Secondary | ICD-10-CM | POA: Diagnosis not present

## 2019-08-29 NOTE — Progress Notes (Signed)
Sodium has decreased to 130 from 132.  We'll address at her appt

## 2019-09-02 ENCOUNTER — Other Ambulatory Visit: Payer: Self-pay

## 2019-09-02 ENCOUNTER — Encounter: Payer: Self-pay | Admitting: Internal Medicine

## 2019-09-02 ENCOUNTER — Ambulatory Visit (INDEPENDENT_AMBULATORY_CARE_PROVIDER_SITE_OTHER): Payer: Medicare Other | Admitting: Internal Medicine

## 2019-09-02 VITALS — BP 122/82 | HR 59 | Temp 97.5°F | Ht 63.0 in | Wt 115.2 lb

## 2019-09-02 DIAGNOSIS — Z9181 History of falling: Secondary | ICD-10-CM

## 2019-09-02 DIAGNOSIS — I10 Essential (primary) hypertension: Secondary | ICD-10-CM | POA: Diagnosis not present

## 2019-09-02 DIAGNOSIS — M159 Polyosteoarthritis, unspecified: Secondary | ICD-10-CM

## 2019-09-02 DIAGNOSIS — H3530 Unspecified macular degeneration: Secondary | ICD-10-CM

## 2019-09-02 DIAGNOSIS — R634 Abnormal weight loss: Secondary | ICD-10-CM

## 2019-09-02 DIAGNOSIS — F411 Generalized anxiety disorder: Secondary | ICD-10-CM | POA: Diagnosis not present

## 2019-09-02 NOTE — Progress Notes (Signed)
Location:  [facility] clinic  Provider: Dr. Bufford Spikes  Goals of Care:  Advanced Directives 09/02/2019  Does Patient Have a Medical Advance Directive? Yes  Type of Estate agent of [place];Living will;Out of facility DNR (pink MOST or yellow form)  Does patient want to make changes to medical advance directive? No - Patient declined  Copy of Healthcare Power of Attorney in Chart? -  Would patient like information on creating a medical advance directive? -  Pre-existing out of facility DNR order (yellow form or pink MOST form) -     Chief Complaint  Patient presents with  . Medical Management of Chronic Issues    4 month follow up  question  about Tylenol ans  baby asprin     HPI: Patient is a 86 y.o. female seen today for medical management of chronic diseases.    She is getting her covid vaccine January 21st, 2021.   She has been doing well. Bored at times. Wish covid would go away. Hoping once vaccine is distributed ahe can eat in the dining room with friends.   Still residing at [facility] independent living with her husband. She still hates the food. Recently talked to the chef of [facility] and learned about different food selections. Has maintained her weight by drinking Ensure shakes daily. Still eats [place] every morning with her husband.   Checks her bp a few times a week. Does not report any high readings. Takes her bp meds daily. Follows a low sodium diet. Denies any chest pain, dizziness, headches, blurred vision.   Still taking xanax twice a day. Denies panic attacks. Believes the xanax helps with her tremors. It also helps her sleep.   She does not watch much t.v. because of her blurred vision, but still enjoys reading. Reports having trouble seeing at night.   Ambulates with her cane or walker. At home she uses cane. Long distances she uses walker. 3 weeks ago she fell without injury.   Asking about taking aspirin. Dr. Chilton Si stopped it  due to bruising. Wondering if she should restart since it has been 3 years.   Uses aspercreme twice a day on her legs and hands to help with pain. She is also taking tylenol 2-3 tablets daily for arthritic pain. Reports her pain as stable.         Past Medical History:  Diagnosis Date  . Allergic rhinitis due to pollen   . Anxiety state, unspecified   . Calculus of kidney   . Cataract   . Diaphragmatic hernia without mention of obstruction or gangrene   . Diffuse cystic mastopathy   . Dizziness and giddiness   . GERD (gastroesophageal reflux disease)   . Hiatal hernia   . Insomnia, unspecified   . Insomnia, unspecified   . Lumbago   . Macular degeneration (senile) of retina, unspecified   . Open wound of toe(s), without mention of complication   . Osteoarthrosis, unspecified whether generalized or localized, unspecified site   . Other and unspecified hyperlipidemia   . Personal history of fall   . Sciatica   . Sebaceous cyst   . Senile osteoporosis   . Unspecified constipation   . Unspecified essential hypertension   . Unspecified hereditary and idiopathic peripheral neuropathy   . Unspecified hypothyroidism   . Unspecified tinnitus     Past Surgical History:  Procedure Laterality Date  . CATARACT EXTRACTION, BILATERAL    . CHOLECYSTECTOMY  07/25/2007  . TONSILLECTOMY AND ADENOIDECTOMY  Bilateral 1943    Allergies  Allergen Reactions  . Ambien [Zolpidem Tartrate]     hallucination  . Bactrim [Sulfamethoxazole-Trimethoprim]     Unknown reaction   . Morphine And Related Nausea And Vomiting  . Zithromax [Azithromycin]     Not effective      Outpatient Encounter Medications as of 09/02/2019  Medication Sig  . acetaminophen (TYLENOL) 500 MG tablet Take 500 mg by mouth every 4 (four) hours as needed.  . ALPRAZolam (XANAX) 0.5 MG tablet TAKE 1 TABLET BY MOUTH THREE TIMES A DAY AS NEEDED FOR ANXIETY  . doxazosin (CARDURA) 4 MG tablet TAKE 1 TABLET BY MOUTH EVERY DAY   . indapamide (LOZOL) 1.25 MG tablet Take 1 tablet (1.25 mg total) by mouth every other day.  . irbesartan (AVAPRO) 150 MG tablet Take 1 tablet (150 mg total) by mouth daily.  Marland Kitchen KLOR-CON M20 20 MEQ tablet TAKE 1 TABLET BY MOUTH EVERY DAY  . levothyroxine (SYNTHROID) 100 MCG tablet TAKE 1 TABLET BY MOUTH EVERY DAY BEFORE BREAKFAST  . loratadine (CLARITIN) 10 MG tablet Take 10 mg by mouth daily.   . metoprolol succinate (TOPROL-XL) 100 MG 24 hr tablet TAKE 1 TABLET BY MOUTH EVERY DAY WITH A MEAL TO CONTROL BLOOD PRESSURE.  . pantoprazole (PROTONIX) 40 MG tablet TAKE 1 TABLET BY MOUTH EVERY DAY  . polyethylene glycol (MIRALAX / GLYCOLAX) packet Take 17 g by mouth daily.   Marland Kitchen Propylene Glycol (SYSTANE BALANCE) 0.6 % SOLN Place 1 drop into both eyes 2 (two) times daily.  . sertraline (ZOLOFT) 50 MG tablet TAKE 1 TABLET BY MOUTH EVERY DAY TO CALM NERVES.  Marland Kitchen temazepam (RESTORIL) 30 MG capsule TAKE 1 CAPSULE BY MOUTH AT BEDTIME AS NEEDED FOR SLEEP   No facility-administered encounter medications on file as of 09/02/2019.    Review of Systems:  Review of Systems  Constitutional: Negative for activity change, appetite change and fatigue.  HENT: Negative for dental problem, hearing loss and trouble swallowing.   Eyes: Positive for photophobia and visual disturbance.  Respiratory: Negative for cough, shortness of breath and wheezing.   Cardiovascular: Negative for chest pain and leg swelling.  Gastrointestinal: Negative for abdominal pain, constipation, diarrhea and nausea.  Endocrine: Negative for polydipsia, polyphagia and polyuria.  Genitourinary: Positive for frequency. Negative for dysuria, hematuria and vaginal bleeding.  Musculoskeletal: Positive for arthralgias.  Neurological: Positive for tremors and weakness. Negative for dizziness and headaches.  Hematological: Bruises/bleeds easily.  Psychiatric/Behavioral: Negative for dysphoric mood and sleep disturbance. The patient is nervous/anxious.      Health Maintenance  Topic Date Due  . TETANUS/TDAP  06/16/2027  . INFLUENZA VACCINE  Completed  . DEXA SCAN  Completed  . PNA vac Low Risk Adult  Completed    Physical Exam: Vitals:   09/02/19 1135  BP: 122/82  Pulse: (!) 59  Temp: (!) 97.5 F (36.4 C)  TempSrc: Temporal  SpO2: 96%  Weight: 115 lb 3.2 oz (52.3 kg)  Height: 5\' 3"  (1.6 m)   Body mass index is 20.41 kg/m. Physical Exam Vitals reviewed.  Constitutional:      General: She is not in acute distress.    Appearance: Normal appearance. She is normal weight.  Neck:     Vascular: No carotid bruit.  Cardiovascular:     Rate and Rhythm: Normal rate and regular rhythm.     Pulses: Normal pulses.     Heart sounds: Normal heart sounds. No murmur.  Pulmonary:  Effort: Pulmonary effort is normal. No respiratory distress.     Breath sounds: Normal breath sounds. No wheezing.  Abdominal:     General: Abdomen is flat. Bowel sounds are normal.     Palpations: Abdomen is soft.  Musculoskeletal:     Right lower leg: No edema.     Left lower leg: No edema.  Skin:    General: Skin is warm and dry.     Capillary Refill: Capillary refill takes less than 2 seconds.  Neurological:     General: No focal deficit present.     Mental Status: She is alert and oriented to person, place, and time. Mental status is at baseline.  Psychiatric:        Mood and Affect: Mood normal.        Behavior: Behavior normal.        Thought Content: Thought content normal.        Judgment: Judgment normal.     Labs reviewed: Basic Metabolic Panel: Recent Labs    12/05/18 1011 08/28/19 0957  NA 132* 130*  K 3.3* 3.6  CL 88* 87*  CO2 36* 37*  GLUCOSE 109* 97  BUN 20 17  CREATININE 0.83 0.71  CALCIUM 9.8 9.6  TSH 4.73*  --    Liver Function Tests: Recent Labs    12/05/18 1011  AST 14  ALT 9  BILITOT 0.6  PROT 6.5   No results for input(s): LIPASE, AMYLASE in the last 8760 hours. No results for input(s): AMMONIA in  the last 8760 hours. CBC: Recent Labs    12/05/18 1011  WBC 4.1  NEUTROABS 2,968  HGB 13.3  HCT 38.9  MCV 87.2  PLT 141   Lipid Panel: No results for input(s): CHOL, HDL, LDLCALC, TRIG, CHOLHDL, LDLDIRECT in the last 8760 hours. Lab Results  Component Value Date   HGBA1C  07/23/2007    5.7 (NOTE)   The ADA recommends the following therapeutic goals for glycemic   control related to Hgb A1C measurement:   Goal of Therapy:   < 7.0% Hgb A1C   Action Suggested:  > 8.0% Hgb A1C   Ref:  Diabetes Care, 22, Suppl. 1, 1999    Procedures since last visit: No results found.  Assessment/Plan 1. Anxiety state - stable with medication, no reports of panic attacks - continue xanax twice daily  2. Essential hypertension - bp at goal< 150/90 - continue current bp medications - continue low sodium diet  3. Loss of weight - has maintained weight of 115lbs for over a month - suspect adding Ensure shakes have helped increased caloric intake - hopeful discussions with chef at [employer] will promote foods she is more willing to eat  4. History of recent fall - she is a high risk for frature d/t osteoprosis - recommend using front rolling walker full time instead of cane - recommend weight bearing exercise  5. Macular degeneration (senile) of retina - she is now getting injections and opposite eye is dry  6. Generalized OA - continue topical aspercreame and tylenol for arthritic pain   Labs/tests ordered:  cbc with differential/platelets, basic metabolic panel, TSH- future Next appt:  02/04/2020

## 2019-09-02 NOTE — Patient Instructions (Signed)
Glad you are scheduled for your covid vaccine.  There's no need to restart the baby aspirin because you don't have an indication.

## 2019-09-05 DIAGNOSIS — Z23 Encounter for immunization: Secondary | ICD-10-CM | POA: Diagnosis not present

## 2019-09-09 DIAGNOSIS — Z20828 Contact with and (suspected) exposure to other viral communicable diseases: Secondary | ICD-10-CM | POA: Diagnosis not present

## 2019-09-09 DIAGNOSIS — Z1159 Encounter for screening for other viral diseases: Secondary | ICD-10-CM | POA: Diagnosis not present

## 2019-09-12 DIAGNOSIS — Z1159 Encounter for screening for other viral diseases: Secondary | ICD-10-CM | POA: Diagnosis not present

## 2019-09-12 DIAGNOSIS — Z20828 Contact with and (suspected) exposure to other viral communicable diseases: Secondary | ICD-10-CM | POA: Diagnosis not present

## 2019-09-16 DIAGNOSIS — Z20828 Contact with and (suspected) exposure to other viral communicable diseases: Secondary | ICD-10-CM | POA: Diagnosis not present

## 2019-09-16 DIAGNOSIS — Z1159 Encounter for screening for other viral diseases: Secondary | ICD-10-CM | POA: Diagnosis not present

## 2019-09-17 ENCOUNTER — Other Ambulatory Visit: Payer: Self-pay | Admitting: Internal Medicine

## 2019-09-17 DIAGNOSIS — F411 Generalized anxiety disorder: Secondary | ICD-10-CM

## 2019-09-17 NOTE — Telephone Encounter (Signed)
Contract was completed in December.  Last refill 08/06/19.

## 2019-09-19 DIAGNOSIS — Z1159 Encounter for screening for other viral diseases: Secondary | ICD-10-CM | POA: Diagnosis not present

## 2019-09-19 DIAGNOSIS — Z20828 Contact with and (suspected) exposure to other viral communicable diseases: Secondary | ICD-10-CM | POA: Diagnosis not present

## 2019-09-23 DIAGNOSIS — Z20828 Contact with and (suspected) exposure to other viral communicable diseases: Secondary | ICD-10-CM | POA: Diagnosis not present

## 2019-09-23 DIAGNOSIS — Z1159 Encounter for screening for other viral diseases: Secondary | ICD-10-CM | POA: Diagnosis not present

## 2019-09-24 ENCOUNTER — Other Ambulatory Visit: Payer: Self-pay | Admitting: Internal Medicine

## 2019-09-30 DIAGNOSIS — Z1159 Encounter for screening for other viral diseases: Secondary | ICD-10-CM | POA: Diagnosis not present

## 2019-09-30 DIAGNOSIS — Z20828 Contact with and (suspected) exposure to other viral communicable diseases: Secondary | ICD-10-CM | POA: Diagnosis not present

## 2019-10-03 DIAGNOSIS — Z23 Encounter for immunization: Secondary | ICD-10-CM | POA: Diagnosis not present

## 2019-10-06 ENCOUNTER — Other Ambulatory Visit: Payer: Self-pay | Admitting: Internal Medicine

## 2019-10-06 DIAGNOSIS — G4700 Insomnia, unspecified: Secondary | ICD-10-CM

## 2019-10-06 DIAGNOSIS — F411 Generalized anxiety disorder: Secondary | ICD-10-CM

## 2019-10-07 DIAGNOSIS — Z20828 Contact with and (suspected) exposure to other viral communicable diseases: Secondary | ICD-10-CM | POA: Diagnosis not present

## 2019-10-07 DIAGNOSIS — Z1159 Encounter for screening for other viral diseases: Secondary | ICD-10-CM | POA: Diagnosis not present

## 2019-10-10 DIAGNOSIS — H353113 Nonexudative age-related macular degeneration, right eye, advanced atrophic without subfoveal involvement: Secondary | ICD-10-CM | POA: Diagnosis not present

## 2019-10-10 DIAGNOSIS — H353212 Exudative age-related macular degeneration, right eye, with inactive choroidal neovascularization: Secondary | ICD-10-CM | POA: Diagnosis not present

## 2019-10-10 DIAGNOSIS — H353221 Exudative age-related macular degeneration, left eye, with active choroidal neovascularization: Secondary | ICD-10-CM | POA: Diagnosis not present

## 2019-10-10 DIAGNOSIS — H353124 Nonexudative age-related macular degeneration, left eye, advanced atrophic with subfoveal involvement: Secondary | ICD-10-CM | POA: Diagnosis not present

## 2019-10-19 ENCOUNTER — Other Ambulatory Visit: Payer: Self-pay | Admitting: Internal Medicine

## 2019-10-19 DIAGNOSIS — F411 Generalized anxiety disorder: Secondary | ICD-10-CM

## 2019-10-28 DIAGNOSIS — Z20828 Contact with and (suspected) exposure to other viral communicable diseases: Secondary | ICD-10-CM | POA: Diagnosis not present

## 2019-10-28 DIAGNOSIS — Z1159 Encounter for screening for other viral diseases: Secondary | ICD-10-CM | POA: Diagnosis not present

## 2019-10-29 ENCOUNTER — Other Ambulatory Visit: Payer: Self-pay

## 2019-10-29 ENCOUNTER — Ambulatory Visit (INDEPENDENT_AMBULATORY_CARE_PROVIDER_SITE_OTHER): Payer: Medicare Other | Admitting: Family

## 2019-10-29 ENCOUNTER — Encounter: Payer: Self-pay | Admitting: Family

## 2019-10-29 VITALS — BP 118/78 | HR 78 | Temp 97.5°F | Ht 63.0 in | Wt 116.0 lb

## 2019-10-29 DIAGNOSIS — M4306 Spondylolysis, lumbar region: Secondary | ICD-10-CM | POA: Diagnosis not present

## 2019-10-29 DIAGNOSIS — S0083XA Contusion of other part of head, initial encounter: Secondary | ICD-10-CM | POA: Diagnosis not present

## 2019-10-29 DIAGNOSIS — S0181XA Laceration without foreign body of other part of head, initial encounter: Secondary | ICD-10-CM | POA: Diagnosis not present

## 2019-10-29 DIAGNOSIS — H3530 Unspecified macular degeneration: Secondary | ICD-10-CM | POA: Diagnosis not present

## 2019-10-29 DIAGNOSIS — R2681 Unsteadiness on feet: Secondary | ICD-10-CM | POA: Diagnosis not present

## 2019-10-29 DIAGNOSIS — G609 Hereditary and idiopathic neuropathy, unspecified: Secondary | ICD-10-CM | POA: Diagnosis not present

## 2019-10-29 NOTE — Progress Notes (Signed)
Provider: Coty Student FNP-C  Kermit Balo, DO  Patient Care Team: Kermit Balo, DO as PCP - General (Geriatric Medicine) Edmon Crape, MD as Consulting Physician (Ophthalmology) Donzetta Starch, MD as Consulting Physician (Dermatology) Hart Carwin, MD (Inactive) as Consulting Physician (Gastroenterology)  Extended Emergency Contact Information Primary Emergency Contact: Poch,Carroll Address: [address]          [place], [place] [address] [place] of [place] Home Phone: [phone] Mobile Phone: [phone] Relation: Spouse  Code Status:  DNR Goals of care: Advanced Directive information Advanced Directives 10/29/2019  Does Patient Have a Medical Advance Directive? Yes  Type of Estate agent of [place];Out of facility DNR (pink MOST or yellow form);Living will  Does patient want to make changes to medical advance directive? No - Patient declined  Copy of Healthcare Power of Attorney in Chart? Yes - validated most recent copy scanned in chart (See row information)  Would patient like information on creating a medical advance directive? -  Pre-existing out of facility DNR order (yellow form or pink MOST form) Pink MOST form placed in chart (order not valid for inpatient use)     Chief Complaint  Patient presents with  . Acute Visit    fell on 10/28/2019 head injury     HPI:  Pt is a 86 y.o. female seen today for an acute visit for evaluation of fell on 10/28/2019.she states was in her closet last night trying on a blouse that her daughter bought for her when she turned and lost balance.she went down on her knees.states her forehead hit the carpet and thinks she had her eye glasses on which scratched her nasal bridge between her eyes.she had her cane behind her next to her bed.she was able to crawl and held onto her bed and got up.Her husband applied ice on her forehead and nasal bridge.she had some bleeding on the nasal bridge which stopped.she  states did not have any dizziness or lightheadedness.No loss of consciousness. She thinks her chronic issues such as worsening vision,lumbr spondylosis,bilateral knee pains and Neuropathy on her feet are contributing to her fall episodes.she states had another fall episode 10 days ago fell on her right hip.No injuries. States was seen by her eye doctor one 1 week ago for macular degeneration had an injection in her left eye. Discussed use of Alprazolam 0.5 mg tablet states usually takes at least twice a day and sometimes whenever she wakes up in the middle of the night might take one because her mind gets to wondering and worrying a lot.she states Xanax also helps to relax due to her back pain.States she is a retired Engineer, civil (consulting) and uses medication cautiously.   Also talked with her option for Physical Therapy due to multiple falls but states was previously referred to PT by her Orthopedic and was given some exercise for her legs that she does daily.Also states walks on a long hallway to the dinning room twice daily and walks outside sometimes.she prefers to exercise by walking.she declines Physical therapy.  She checks her blood pressure once in a while and has been within normal range. Drinks ensure protein supplements.she has gained one pound weight since previous visit 09/02/2019.    Past Medical History:  Diagnosis Date  . Allergic rhinitis due to pollen   . Anxiety state, unspecified   . Calculus of kidney   . Cataract   . Diaphragmatic hernia without mention of obstruction or gangrene   . Diffuse cystic mastopathy   .  Dizziness and giddiness   . GERD (gastroesophageal reflux disease)   . Hiatal hernia   . Insomnia, unspecified   . Insomnia, unspecified   . Lumbago   . Macular degeneration (senile) of retina, unspecified   . Open wound of toe(s), without mention of complication   . Osteoarthrosis, unspecified whether generalized or localized, unspecified site   . Other and unspecified  hyperlipidemia   . Personal history of fall   . Sciatica   . Sebaceous cyst   . Senile osteoporosis   . Unspecified constipation   . Unspecified essential hypertension   . Unspecified hereditary and idiopathic peripheral neuropathy   . Unspecified hypothyroidism   . Unspecified tinnitus    Past Surgical History:  Procedure Laterality Date  . CATARACT EXTRACTION, BILATERAL    . CHOLECYSTECTOMY  07/25/2007  . TONSILLECTOMY AND ADENOIDECTOMY Bilateral 1943    Allergies  Allergen Reactions  . Ambien [Zolpidem Tartrate]     hallucination  . Bactrim [Sulfamethoxazole-Trimethoprim]     Unknown reaction   . Morphine And Related Nausea And Vomiting  . Zithromax [Azithromycin]     Not effective      Outpatient Encounter Medications as of 10/29/2019  Medication Sig  . acetaminophen (TYLENOL) 500 MG tablet Take 500 mg by mouth every 4 (four) hours as needed.  . ALPRAZolam (XANAX) 0.5 MG tablet TAKE 1 TABLET BY MOUTH THREE TIMES A DAY AS NEEDED FOR ANXIETY  . doxazosin (CARDURA) 4 MG tablet TAKE 1 TABLET BY MOUTH EVERY DAY  . indapamide (LOZOL) 1.25 MG tablet Take 1 tablet (1.25 mg total) by mouth every other day.  . irbesartan (AVAPRO) 150 MG tablet Take 1 tablet (150 mg total) by mouth daily.  Marland Kitchen. KLOR-CON M20 20 MEQ tablet TAKE 1 TABLET BY MOUTH EVERY DAY  . levothyroxine (SYNTHROID) 100 MCG tablet TAKE 1 TABLET BY MOUTH EVERY DAY BEFORE BREAKFAST  . loratadine (CLARITIN) 10 MG tablet Take 10 mg by mouth daily.   . metoprolol succinate (TOPROL-XL) 100 MG 24 hr tablet TAKE 1 TABLET BY MOUTH EVERY DAY WITH A MEAL TO CONTROL BLOOD PRESSURE  . pantoprazole (PROTONIX) 40 MG tablet TAKE 1 TABLET BY MOUTH EVERY DAY  . polyethylene glycol (MIRALAX / GLYCOLAX) packet Take 17 g by mouth daily.   Marland Kitchen. Propylene Glycol (SYSTANE BALANCE) 0.6 % SOLN Place 1 drop into both eyes 2 (two) times daily.  . sertraline (ZOLOFT) 50 MG tablet TAKE 1 TABLET BY MOUTH EVERY DAY TO CALM NERVES.  Marland Kitchen. temazepam  (RESTORIL) 30 MG capsule TAKE 1 CAPSULE BY MOUTH AT BEDTIME AS NEEDED FOR SLEEP   No facility-administered encounter medications on file as of 10/29/2019.    Review of Systems  Constitutional: Negative for appetite change, chills, fatigue and fever.       One pound weight gain since 09/02/2019   HENT: Negative for congestion, ear discharge, ear pain, postnasal drip, rhinorrhea, sinus pressure, sinus pain, sneezing and sore throat.   Eyes: Positive for visual disturbance. Negative for pain, discharge, redness and itching.       Sees her Ophthalmology for vision changes  Respiratory: Negative for cough, chest tightness, shortness of breath and wheezing.   Cardiovascular: Negative for chest pain, palpitations and leg swelling.  Gastrointestinal: Negative for abdominal distention, abdominal pain, constipation, diarrhea, nausea and vomiting.  Genitourinary: Negative for difficulty urinating, dysuria, flank pain, frequency and urgency.  Musculoskeletal: Positive for arthralgias, back pain and gait problem. Negative for joint swelling.  Skin: Negative for color change,  pallor and rash.       Skin tear to nasal bridge between her eyes.  Neurological: Negative for dizziness, speech difficulty, weakness, light-headedness and headaches.       Chronic numbness on the legs   Hematological: Does not bruise/bleed easily.  Psychiatric/Behavioral: Negative for agitation, confusion and sleep disturbance.    Immunization History  Administered Date(s) Administered  . Fluad Quad(high Dose 65+) 04/08/2019  . Influenza, High Dose Seasonal PF 05/04/2017, 05/10/2018  . Influenza,inj,Quad PF,6+ Mos 05/27/2013, 04/30/2014, 05/18/2015, 05/16/2016  . Influenza-Unspecified 05/04/2011  . PPD Test 06/20/2017  . Pneumococcal Conjugate-13 08/11/2015  . Pneumococcal-Unspecified 08/16/1999  . Td 12/07/2005  . Tdap 06/15/2017  . Zoster 08/14/2015   Pertinent  Health Maintenance Due  Topic Date Due  . INFLUENZA  VACCINE  Completed  . DEXA SCAN  Completed  . PNA vac Low Risk Adult  Completed   Fall Risk  10/29/2019 09/02/2019 02/22/2019 02/01/2019 12/24/2018  Falls in the past year? 1 0 0 1 0  Number falls in past yr: 1 0 0 0 0  Comment - - - - -  Injury with Fall? 1 0 0 0 0  Comment - - - - -  Risk Factor Category  - - - - -  Risk for fall due to : - - - - -  Follow up - - - - -      Vitals:   10/29/19 1410  BP: 118/78  Pulse: 78  Temp: (!) 97.5 F (36.4 C)  TempSrc: Temporal  SpO2: 98%  Weight: 116 lb (52.6 kg)  Height: 5\' 3"  (1.6 m)   Body mass index is 20.55 kg/m. Physical Exam Vitals reviewed.  Constitutional:      General: She is not in acute distress.    Appearance: She is normal weight. She is not ill-appearing.  HENT:     Head: Normocephalic.     Right Ear: Tympanic membrane, ear canal and external ear normal. There is no impacted cerumen.     Left Ear: Tympanic membrane, ear canal and external ear normal. There is no impacted cerumen.     Nose: Nose normal. No congestion or rhinorrhea.     Mouth/Throat:     Mouth: Mucous membranes are moist.     Pharynx: Oropharynx is clear. No oropharyngeal exudate or posterior oropharyngeal erythema.  Eyes:     General: No scleral icterus.       Right eye: No discharge.        Left eye: No discharge.     Extraocular Movements: Extraocular movements intact.     Conjunctiva/sclera: Conjunctivae normal.     Pupils: Pupils are equal, round, and reactive to light.  Neck:     Vascular: No carotid bruit.  Cardiovascular:     Rate and Rhythm: Normal rate and regular rhythm.     Pulses: Normal pulses.     Heart sounds: Normal heart sounds. No murmur. No friction rub. No gallop.   Pulmonary:     Effort: Pulmonary effort is normal. No respiratory distress.     Breath sounds: Normal breath sounds. No wheezing, rhonchi or rales.  Chest:     Chest wall: No tenderness.  Abdominal:     General: Bowel sounds are normal. There is no  distension.     Palpations: Abdomen is soft. There is no mass.     Tenderness: There is no abdominal tenderness. There is no right CVA tenderness, left CVA tenderness, guarding or rebound.  Musculoskeletal:  General: No swelling.     Cervical back: Normal range of motion. No rigidity or tenderness.     Right lower leg: No edema.     Left lower leg: No edema.     Comments: Unsteady gait.Knee high ted hose in place   Lymphadenopathy:     Cervical: No cervical adenopathy.  Skin:    General: Skin is warm and dry.     Coloration: Skin is not pale.     Findings: Bruising present. No erythema or rash.          Comments: 1.small skin tear over nasal bridge between eyes with flap intact.surrounding skin tissue with some purple bruise.slight tender to touch .  2. Right knee small purple bruise.non-tender to touch.   Neurological:     Mental Status: She is alert and oriented to person, place, and time.     Cranial Nerves: No cranial nerve deficit.     Motor: No weakness.     Coordination: Coordination normal.     Gait: Gait abnormal.  Psychiatric:        Mood and Affect: Mood normal.        Behavior: Behavior normal.        Thought Content: Thought content normal.        Judgment: Judgment normal.     Labs reviewed: Recent Labs    12/05/18 1011 08/28/19 0957  NA 132* 130*  K 3.3* 3.6  CL 88* 87*  CO2 36* 37*  GLUCOSE 109* 97  BUN 20 17  CREATININE 0.83 0.71  CALCIUM 9.8 9.6   Recent Labs    12/05/18 1011  AST 14  ALT 9  BILITOT 0.6  PROT 6.5   Recent Labs    12/05/18 1011  WBC 4.1  NEUTROABS 2,968  HGB 13.3  HCT 38.9  MCV 87.2  PLT 141   Lab Results  Component Value Date   TSH 4.73 (H) 12/05/2018   Lab Results  Component Value Date   HGBA1C  07/23/2007    5.7 (NOTE)   The ADA recommends the following therapeutic goals for glycemic   control related to Hgb A1C measurement:   Goal of Therapy:   < 7.0% Hgb A1C   Action Suggested:  > 8.0% Hgb A1C    Ref:  Diabetes Care, 22, Suppl. 1, 1999   Lab Results  Component Value Date   CHOL 175 03/05/2018   HDL 59 03/05/2018   LDLCALC 92 03/05/2018   TRIG 147 03/05/2018   CHOLHDL 3.0 03/05/2018    Significant Diagnostic Results in last 30 days:  No results found.  Assessment/Plan 1. Bruise of face, initial encounter Purple bruise on nasal bridge between eyes noted.slight tender to touch.Not on any anticoagulation or ASA. -continue to monitor.   2. Unsteady gait Uses a cane and has a walker when out of her room though admits to leaving her cane at a distance and holding onto furniture. - fall and safety precautions advised verbalized understanding. - Recommended Physical Therapy due to frequent fallls but has declined thinks she gets enough exercise walking from her room to the dinning twice daily in the facility and walks outside too.she does leg exercises provided by previous Physical Therapist.   3. Laceration of face without complication, initial encounter Small laceration flap closed.cleansed with saline,pat dry and TAB ointment applied and covered with band aid.No signs of infection or fracture noted. - wash nasal bridge skin tear with Cetaphil( patient prefers Cetaphil though antibacterial soap  and water or saline) with warm water daily,pat dry and apply triple antibiotic ointment and cover with bandage daily x 3 days the leave open to air. - Notify provider for any signs of infection such as any redness,swelling or drainage. - declined CBC to be drawn states has upcoming labs with Dr.Reed.   4. Macular degeneration (senile) of retina - poor vision contributing to her falls  -continue current eye drops - continue to follow up with Opthalmology as directed.   5. Hereditary and idiopathic peripheral neuropathy Contributing to her falls. Declined referral to Physical Therapy.   6. Lumbar spondylolysis Continue on Tylenol 500 mg tablet every 4 hours as needed.   Family/ staff  Communication: Reviewed plan of care with patient verbalized understanding.   Labs/tests ordered: None   Next Appointment: as needed if symptoms worsen or fail to improve.Has appointment with Dr. Renato Gails 12/02/2019    Caesar Bookman, NP

## 2019-10-29 NOTE — Patient Instructions (Signed)
- wash nasal bridge skin tear with Cetaphil with warm water daily,pat dry and apply triple antibiotic ointment and cover with bandage daily x 3 days the leave open to air. - Notify provider for any signs of infection such as any redness,swelling or drainage.    Skin Tear A skin tear is a wound in which the top layers of skin peel off. This is a common problem for older people. It can also be a problem for people who take certain medicines for too long. To repair the skin, your doctor may use:  Tape.  Skin tape (adhesive) strips. A bandage (dressing) may also be placed over the tape or skin tape strips. Follow these instructions at home: Wound care   Clean the wound as told by your doctor. You may be told to keep the wound dry for the first few days. If you are told to clean the wound: ? Wash the wound with mild soap and water, a wound cleanser, or a salt-water (saline) solution. ? If you use soap, rinse the wound with water to remove all soap. ? Do not rub the wound dry. Pat the wound gently, or let it air dry. ? Keep the bandage dry as told by your doctor.  Change any bandage as told by your doctor. This includes changing the bandage if it gets wet, gets dirty, or starts to smell bad. To change your bandage: ? Wash your hands with soap and water before and after you change your bandage. If you cannot use soap and water, use hand sanitizer. ? Leave tape or skin tape strips in place. They may need to stay in place for 2 weeks or longer. If tape strips get loose and curl up, you may trim the loose edges. Do not remove tape strips completely unless your doctor says it is okay.  Check your wound every day for signs of infection. Check for: ? Redness, swelling, or pain. ? More fluid or blood. ? Warmth. ? Pus or a bad smell.  Do not scratch or pick at the wound.  Protect the injured area until it has healed. Medicines  Take or apply over-the-counter and prescription medicines only as  told by your doctor.  If you were prescribed an antibiotic medicine, take or apply it as told by your doctor. Do not stop using the antibiotic even if your condition gets better. General instructions   Keep the bandage dry as told by your doctor.  Do not take baths, swim, use a hot tub, or do anything that puts your wound underwater until your doctor approves. Ask your doctor if you may take showers. You may only be allowed to take sponge baths.  Keep all follow-up visits as told by your doctor. This is important. Contact a doctor if:  You have redness, swelling, or pain around your wound.  You have more fluid or blood coming from your wound.  Your wound feels warm to the touch.  You have pus or a bad smell coming from your wound. Get help right away if:  You have a red streak that goes away from the skin tear.  You have a fever and chills, and your symptoms get worse all of a sudden. Summary  A skin tear is a wound in which the top layers of skin peel off.  To repair the skin, your doctor may use tape or skin tape strips.  Change any bandage as told by your doctor.  Take or apply over-the-counter and prescription medicines  only as told by your doctor.  Contact a doctor if you have signs of infection. This information is not intended to replace advice given to you by your health care provider. Make sure you discuss any questions you have with your health care provider. Document Revised: 11/22/2018 Document Reviewed: 05/22/2018 Elsevier Patient Education  2020 [employer].

## 2019-11-04 DIAGNOSIS — Z1159 Encounter for screening for other viral diseases: Secondary | ICD-10-CM | POA: Diagnosis not present

## 2019-11-04 DIAGNOSIS — Z20828 Contact with and (suspected) exposure to other viral communicable diseases: Secondary | ICD-10-CM | POA: Diagnosis not present

## 2019-11-11 DIAGNOSIS — Z20828 Contact with and (suspected) exposure to other viral communicable diseases: Secondary | ICD-10-CM | POA: Diagnosis not present

## 2019-11-11 DIAGNOSIS — Z1159 Encounter for screening for other viral diseases: Secondary | ICD-10-CM | POA: Diagnosis not present

## 2019-11-14 ENCOUNTER — Encounter: Payer: Self-pay | Admitting: Family

## 2019-11-14 ENCOUNTER — Ambulatory Visit (INDEPENDENT_AMBULATORY_CARE_PROVIDER_SITE_OTHER): Payer: Medicare Other | Admitting: Family

## 2019-11-14 ENCOUNTER — Other Ambulatory Visit: Payer: Self-pay

## 2019-11-14 VITALS — BP 160/118 | HR 106

## 2019-11-14 DIAGNOSIS — I10 Essential (primary) hypertension: Secondary | ICD-10-CM

## 2019-11-14 NOTE — Progress Notes (Signed)
Patient ID: [name], female   DOB: 1931-05-13, 86 y.o.   MRN: [id number] This service is provided via telemedicine  No vital signs collected/recorded due to the encounter was a telemedicine visit.   Location of patient (ex: home, work):  HOME  Patient consents to a telephone visit:  YES  Location of the provider (ex: office, home):  OFFICE  Name of any referring provider:  TIFFANY REED, DO  Names of all persons participating in the telemedicine service and their role in the encounter:  PATIENT, [place], Marlowe Sax, NP  Time spent on call:  4:21  Provider: Rechelle Niebla FNP-C  Gayland Curry, DO  Patient Care Team: Gayland Curry, DO as PCP - General (Geriatric Medicine) Zadie Rhine Clent Demark, MD as Consulting Physician (Ophthalmology) Jarome Matin, MD as Consulting Physician (Dermatology) Lafayette Dragon, MD (Inactive) as Consulting Physician (Gastroenterology)  Extended Emergency Contact Information Primary Emergency Contact: [place],Carroll Address: [place], [place] [address] [address] [place] Phone: [phone] Mobile Phone: [phone] Relation: Spouse  Code Status:  DNR Goals of care: Advanced Directive information Advanced Directives 10/29/2019  Does Patient Have a Medical Advance Directive? Yes  Type of Paramedic of [place];Out of facility DNR (pink MOST or yellow form);Living will  Does patient want to make changes to medical advance directive? No - Patient declined  Copy of [place] in Chart? Yes - validated most recent copy scanned in chart (See row information)  Would patient like information on creating a medical advance directive? -  Pre-existing out of facility DNR order (yellow form or pink MOST form) Pink MOST form placed in chart (order not valid for inpatient use)     Chief Complaint  Patient presents with  . Acute Visit    Feels like blood pressure is dropping     HPI:  Pt is a 86 y.o. female seen today for an acute visit for evaluation of blood pressure.she states woke up up this morning felt like her blood pressure was low had some dizziness.States dizziness resolved after about an hour.she checked her blood pressure was 118/84 ,HR 67 she went out to eat breakfast with the husband then returned and took her metoprolol.she rechecked her B/P later was 107/101 and HR 123 so she took her doxazosin.she states was supposed to take her indapamide today which is every other day but skipped.she has tried to recheck her Blood pressure again but her machine says error.I've discussed with her to get blood pressure checked by the Nurse at [facility] where she resides but states did not sign up for health care.she will try and call the Nurse states they've come in the past to check her blood pressure.  She denies any weakness, headache,dizziness,palpitation ,chest pain or shortness of breath.   Patient called back states facility Nurse checked the Blood pressure now is 160/120 and HR 118.  Past Medical History:  Diagnosis Date  . Allergic rhinitis due to pollen   . Anxiety state, unspecified   . Calculus of kidney   . Cataract   . Diaphragmatic hernia without mention of obstruction or gangrene   . Diffuse cystic mastopathy   . Dizziness and giddiness   . GERD (gastroesophageal reflux disease)   . Hiatal hernia   . Insomnia, unspecified   . Insomnia, unspecified   . Lumbago   . Macular degeneration (senile) of retina, unspecified   .  Open wound of toe(s), without mention of complication   . Osteoarthrosis, unspecified whether generalized or localized, unspecified site   . Other and unspecified hyperlipidemia   . Personal history of fall   . Sciatica   . Sebaceous cyst   . Senile osteoporosis   . Unspecified constipation   . Unspecified essential hypertension   . Unspecified hereditary and idiopathic peripheral neuropathy   . Unspecified hypothyroidism    . Unspecified tinnitus    Past Surgical History:  Procedure Laterality Date  . CATARACT EXTRACTION, BILATERAL    . CHOLECYSTECTOMY  07/25/2007  . TONSILLECTOMY AND ADENOIDECTOMY Bilateral 1943    Allergies  Allergen Reactions  . Ambien [Zolpidem Tartrate]     hallucination  . Bactrim [Sulfamethoxazole-Trimethoprim]     Unknown reaction   . Morphine And Related Nausea And Vomiting  . Zithromax [Azithromycin]     Not effective      Outpatient Encounter Medications as of 11/14/2019  Medication Sig  . acetaminophen (TYLENOL) 500 MG tablet Take 500 mg by mouth every 4 (four) hours as needed.  . ALPRAZolam (XANAX) 0.5 MG tablet TAKE 1 TABLET BY MOUTH THREE TIMES A DAY AS NEEDED FOR ANXIETY  . doxazosin (CARDURA) 4 MG tablet TAKE 1 TABLET BY MOUTH EVERY DAY  . indapamide (LOZOL) 1.25 MG tablet Take 1 tablet (1.25 mg total) by mouth every other day.  . irbesartan (AVAPRO) 150 MG tablet Take 1 tablet (150 mg total) by mouth daily.  Marland Kitchen KLOR-CON M20 20 MEQ tablet TAKE 1 TABLET BY MOUTH EVERY DAY  . levothyroxine (SYNTHROID) 100 MCG tablet TAKE 1 TABLET BY MOUTH EVERY DAY BEFORE BREAKFAST  . loratadine (CLARITIN) 10 MG tablet Take 10 mg by mouth daily.   . metoprolol succinate (TOPROL-XL) 100 MG 24 hr tablet TAKE 1 TABLET BY MOUTH EVERY DAY WITH A MEAL TO CONTROL BLOOD PRESSURE  . pantoprazole (PROTONIX) 40 MG tablet TAKE 1 TABLET BY MOUTH EVERY DAY  . polyethylene glycol (MIRALAX / GLYCOLAX) packet Take 17 g by mouth daily.   Marland Kitchen Propylene Glycol (SYSTANE BALANCE) 0.6 % SOLN Place 1 drop into both eyes 2 (two) times daily.  . sertraline (ZOLOFT) 50 MG tablet TAKE 1 TABLET BY MOUTH EVERY DAY TO CALM NERVES.  Marland Kitchen temazepam (RESTORIL) 30 MG capsule TAKE 1 CAPSULE BY MOUTH AT BEDTIME AS NEEDED FOR SLEEP   No facility-administered encounter medications on file as of 11/14/2019.    Review of Systems  Constitutional: Negative for appetite change, chills, fatigue and fever.  HENT: Negative for  congestion, rhinorrhea, sinus pressure, sinus pain, sneezing and sore throat.   Eyes: Negative for discharge, redness and itching.  Respiratory: Negative for cough, chest tightness, shortness of breath and wheezing.   Cardiovascular: Negative for chest pain, palpitations and leg swelling.  Gastrointestinal: Negative for abdominal distention, abdominal pain, diarrhea, nausea and vomiting.  Skin: Negative for color change, pallor and rash.  Neurological: Negative for dizziness, speech difficulty, weakness, light-headedness, numbness and headaches.  Psychiatric/Behavioral: Negative for agitation, confusion and sleep disturbance. The patient is not nervous/anxious.     Immunization History  Administered Date(s) Administered  . Fluad Quad(high Dose 65+) 04/08/2019  . Influenza, High Dose Seasonal PF 05/04/2017, 05/10/2018  . Influenza,inj,Quad PF,6+ Mos 05/27/2013, 04/30/2014, 05/18/2015, 05/16/2016  . Influenza-Unspecified 05/04/2011  . PPD Test 06/20/2017  . Pneumococcal Conjugate-13 08/11/2015  . Pneumococcal-Unspecified 08/16/1999  . Td 12/07/2005  . Tdap 06/15/2017  . Zoster 08/14/2015   Pertinent  Health Maintenance Due  Topic Date  Due  . INFLUENZA VACCINE  03/15/2020  . DEXA SCAN  Completed  . PNA vac Low Risk Adult  Completed   Fall Risk  10/29/2019 09/02/2019 02/22/2019 02/01/2019 12/24/2018  Falls in the past year? 1 0 0 1 0  Number falls in past yr: 1 0 0 0 0  Comment - - - - -  Injury with Fall? 1 0 0 0 0  Comment - - - - -  Risk Factor Category  - - - - -  Risk for fall due to : - - - - -  Follow up - - - - -   There were no vitals filed for this visit. There is no height or weight on file to calculate BMI. Physical Exam Unable to complete on the Telephone visit.  Labs reviewed: Recent Labs    12/05/18 1011 08/28/19 0957  NA 132* 130*  K 3.3* 3.6  CL 88* 87*  CO2 36* 37*  GLUCOSE 109* 97  BUN 20 17  CREATININE 0.83 0.71  CALCIUM 9.8 9.6   Recent Labs     12/05/18 1011  AST 14  ALT 9  BILITOT 0.6  PROT 6.5   Recent Labs    12/05/18 1011  WBC 4.1  NEUTROABS 2,968  HGB 13.3  HCT 38.9  MCV 87.2  PLT 141   Lab Results  Component Value Date   TSH 4.73 (H) 12/05/2018   Lab Results  Component Value Date   HGBA1C  07/23/2007    5.7 (NOTE)   The ADA recommends the following therapeutic goals for glycemic   control related to Hgb A1C measurement:   Goal of Therapy:   < 7.0% Hgb A1C   Action Suggested:  > 8.0% Hgb A1C   Ref:  Diabetes Care, 22, Suppl. 1, 1999   Lab Results  Component Value Date   CHOL 175 03/05/2018   HDL 59 03/05/2018   LDLCALC 92 03/05/2018   TRIG 147 03/05/2018   CHOLHDL 3.0 03/05/2018    Significant Diagnostic Results in last 30 days:  No results found.  Assessment/Plan  Essential hypertension Reports dizziness upon waking up in the morning skipped her medication then later took one and another one several hours later.Her blood pressure reading error. - I've encouraged her to get blood pressure checked by facility Nurse at [facility] to verify since her machine is reading error. Facility Nurse rechecked B/p 160/120 with HR 118  - Patient advised to go to ED for evaluation of blood pressure via EMS.Patient verbalized understanding and hanged up. - she will follow up here in the office on 11/18/2019 to evaluate blood pressure and adjust medication if needed.  Family/ staff Communication: Reviewed plan of care with patient verbalized understanding to be evaluated in ED for high blood pressure.   Labs/tests ordered: None   Next Appointment: 11/18/2019 for high blood pressure.   Spent 11 minutes of non-face to face with patient   I connected with  Freddrick March on 11/14/19 by a video enabled telemedicine application and verified that I am speaking with the correct person using two identifiers.   I discussed the limitations of evaluation and management by telemedicine. The patient expressed understanding and  agreed to proceed.     Caesar Bookman, NP

## 2019-11-14 NOTE — Patient Instructions (Signed)
Please go to ED for evaluation of high blood pressure.

## 2019-11-17 ENCOUNTER — Emergency Department (HOSPITAL_COMMUNITY): Payer: Medicare Other

## 2019-11-17 ENCOUNTER — Other Ambulatory Visit: Payer: Self-pay

## 2019-11-17 ENCOUNTER — Encounter (HOSPITAL_COMMUNITY): Payer: Self-pay | Admitting: Pharmacy Technician

## 2019-11-17 ENCOUNTER — Emergency Department (HOSPITAL_COMMUNITY)
Admission: EM | Admit: 2019-11-17 | Discharge: 2019-11-18 | Disposition: A | Discharge status: Home / Self Care | Payer: Medicare Other | Attending: Emergency Medicine | Admitting: Emergency Medicine

## 2019-11-17 DIAGNOSIS — Z9049 Acquired absence of other specified parts of digestive tract: Secondary | ICD-10-CM | POA: Insufficient documentation

## 2019-11-17 DIAGNOSIS — Z79899 Other long term (current) drug therapy: Secondary | ICD-10-CM | POA: Diagnosis not present

## 2019-11-17 DIAGNOSIS — E039 Hypothyroidism, unspecified: Secondary | ICD-10-CM | POA: Diagnosis not present

## 2019-11-17 DIAGNOSIS — I4891 Unspecified atrial fibrillation: Secondary | ICD-10-CM | POA: Diagnosis not present

## 2019-11-17 DIAGNOSIS — I10 Essential (primary) hypertension: Secondary | ICD-10-CM | POA: Diagnosis not present

## 2019-11-17 DIAGNOSIS — R000 Tachycardia, unspecified: Secondary | ICD-10-CM | POA: Diagnosis not present

## 2019-11-17 DIAGNOSIS — R0602 Shortness of breath: Secondary | ICD-10-CM | POA: Insufficient documentation

## 2019-11-17 DIAGNOSIS — R002 Palpitations: Secondary | ICD-10-CM | POA: Diagnosis not present

## 2019-11-17 LAB — CBC WITH DIFFERENTIAL/PLATELET
Abs Immature Granulocytes: 0.01 10*3/uL (ref 0.00–0.07)
Basophils Absolute: 0 10*3/uL (ref 0.0–0.1)
Basophils Relative: 0 %
Eosinophils Absolute: 0 10*3/uL (ref 0.0–0.5)
Eosinophils Relative: 0 %
HCT: 42.9 % (ref 36.0–46.0)
Hemoglobin: 14.1 g/dL (ref 12.0–15.0)
Immature Granulocytes: 0 %
Lymphocytes Relative: 13 %
Lymphs Abs: 0.6 10*3/uL — ABNORMAL LOW (ref 0.7–4.0)
MCH: 29.1 pg (ref 26.0–34.0)
MCHC: 32.9 g/dL (ref 30.0–36.0)
MCV: 88.6 fL (ref 80.0–100.0)
Monocytes Absolute: 0.3 10*3/uL (ref 0.1–1.0)
Monocytes Relative: 7 %
Neutro Abs: 3.8 10*3/uL (ref 1.7–7.7)
Neutrophils Relative %: 80 %
Platelets: 117 10*3/uL — ABNORMAL LOW (ref 150–400)
RBC: 4.84 MIL/uL (ref 3.87–5.11)
RDW: 12.7 % (ref 11.5–15.5)
WBC: 4.8 10*3/uL (ref 4.0–10.5)
nRBC: 0 % (ref 0.0–0.2)

## 2019-11-17 LAB — BRAIN NATRIURETIC PEPTIDE: B Natriuretic Peptide: 307.3 pg/mL — ABNORMAL HIGH (ref 0.0–100.0)

## 2019-11-17 LAB — BASIC METABOLIC PANEL
Anion gap: 11 (ref 5–15)
BUN: 12 mg/dL (ref 8–23)
CO2: 31 mmol/L (ref 22–32)
Calcium: 9.3 mg/dL (ref 8.9–10.3)
Chloride: 87 mmol/L — ABNORMAL LOW (ref 98–111)
Creatinine, Ser: 0.72 mg/dL (ref 0.44–1.00)
GFR calc Af Amer: >60 mL/min (ref >60)
GFR calc non Af Amer: >60 mL/min (ref >60)
Glucose, Bld: 131 mg/dL — ABNORMAL HIGH (ref 70–99)
Potassium: 4.2 mmol/L (ref 3.5–5.1)
Sodium: 129 mmol/L — ABNORMAL LOW (ref 135–145)

## 2019-11-17 LAB — URINALYSIS, ROUTINE W REFLEX MICROSCOPIC
Bilirubin Urine: NEGATIVE
Glucose, UA: NEGATIVE mg/dL
Hgb urine dipstick: NEGATIVE
Ketones, ur: NEGATIVE mg/dL
Leukocytes,Ua: NEGATIVE
Nitrite: NEGATIVE
Protein, ur: NEGATIVE mg/dL
Specific Gravity, Urine: 1.006 (ref 1.005–1.030)
pH: 8 (ref 5.0–8.0)

## 2019-11-17 LAB — PROTIME-INR
INR: 1 (ref 0.8–1.2)
Prothrombin Time: 13.2 seconds (ref 11.4–15.2)

## 2019-11-17 LAB — TSH: TSH: 3.002 u[IU]/mL (ref 0.350–4.500)

## 2019-11-17 LAB — TROPONIN I (HIGH SENSITIVITY)
Troponin I (High Sensitivity): 8 ng/L (ref <18)
Troponin I (High Sensitivity): 10 ng/L (ref <18)

## 2019-11-17 LAB — MAGNESIUM: Magnesium: 1.7 mg/dL (ref 1.7–2.4)

## 2019-11-17 MED ORDER — DILTIAZEM HCL-DEXTROSE 125-5 MG/125ML-% IV SOLN (PREMIX)
5.0000 mg/h | INTRAVENOUS | Status: DC
  Administered 2019-11-17: 5 mg/h via INTRAVENOUS
  Filled 2019-11-17: qty 125

## 2019-11-17 MED ORDER — DILTIAZEM HCL ER COATED BEADS 120 MG PO CP24: 120.0000 mg | ORAL_CAPSULE | Freq: Every day | ORAL | 1 refills | Status: DC

## 2019-11-17 MED ORDER — DILTIAZEM HCL-DEXTROSE 125-5 MG/125ML-% IV SOLN (PREMIX): 5.0000 mg/h | INTRAVENOUS | Status: DC

## 2019-11-17 MED ORDER — DILTIAZEM LOAD VIA INFUSION: 15.0000 mg | Freq: Once | INTRAVENOUS | Status: DC

## 2019-11-17 MED ORDER — DILTIAZEM LOAD VIA INFUSION
10.0000 mg | Freq: Once | INTRAVENOUS | Status: AC
  Administered 2019-11-17: 10 mg via INTRAVENOUS
  Filled 2019-11-17: qty 10

## 2019-11-17 NOTE — ED Triage Notes (Signed)
Pt bib ems from [facility] for shob and elevated HR. Pt with palpitations today with dizziness and generalized weakness, took her home meds and layed down. Pt continued to feel weak so called EMS. Pt found to be in afib 105-130, with no hx.  184/117 97% RA 97.71F RR 18

## 2019-11-17 NOTE — ED Provider Notes (Signed)
[facility] EMERGENCY DEPARTMENT Provider Note   CSN: [id number] Arrival date & time: 11/17/19  1157     History No chief complaint on file.   [name] is a 86 y.o. female.  HPI Patient reports that she was feeling fine today but then she suddenly got a feeling of heart racing and dizziness.  She did feel lightheadedness and short of breath.  Heart rate was noted to be elevated and irregular.  She reports that she has taken her regular medications including Toprol-XL 100 mg at 9:30 AM and tried to rest but the symptoms persisted.  EMS subsequently called.  Patient found to be in atrial fibrillation rates of 10 5-1 30.  Patient denies any known history of atrial fibrillation.  She reports she was having symptoms 4 days ago, on Thursday.  She did feel like she was getting palpitations and her heart was skipping.  She was evaluated by EMS at that time but they did not identify any acute problems.  They asked her about atrial fibrillation but did not report that she was having it at that time.  Patient does report she has fairly frequent episodes of palpitations but this morning was different in terms of the extreme racing sensation of her heart and lightheadedness.  She reports she has been monitoring her blood pressure at home.  3 days ago her machine was reading error and she did have the assistant at the assisted living check her blood pressure.  She reports at that time blood pressures were all right and heart rate was in the 110s.    Past Medical History:  Diagnosis Date  . Allergic rhinitis due to pollen   . Anxiety state, unspecified   . Calculus of kidney   . Cataract   . Diaphragmatic hernia without mention of obstruction or gangrene   . Diffuse cystic mastopathy   . Dizziness and giddiness   . GERD (gastroesophageal reflux disease)   . Hiatal hernia   . Insomnia, unspecified   . Insomnia, unspecified   . Lumbago   . Macular degeneration (senile) of retina,  unspecified   . Open wound of toe(s), without mention of complication   . Osteoarthrosis, unspecified whether generalized or localized, unspecified site   . Other and unspecified hyperlipidemia   . Personal history of fall   . Sciatica   . Sebaceous cyst   . Senile osteoporosis   . Unspecified constipation   . Unspecified essential hypertension   . Unspecified hereditary and idiopathic peripheral neuropathy   . Unspecified hypothyroidism   . Unspecified tinnitus     Patient Active Problem List   Diagnosis Date Noted  . Generalized OA 09/02/2019  . Osteoporosis 06/20/2017  . 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 Iron deficiency anemia secondary to inadequate dietary iron intake 02/10/2017  . Facial asymmetry 11/09/2016  . Incontinence 09/22/2016  . Hypoxia 08/02/2016  . Hypokalemia 08/02/2016  . Lumbar spondylolysis 04/13/2016  . Bilateral knee pain 12/08/2015  . Abnormality of gait 08/11/2015  . Edema 08/11/2015  . Bunion 08/11/2015  . Anxiety state 03/10/2015  . Palpitations 02/18/2015  . GERD (gastroesophageal reflux disease) 11/05/2014  . Hypothyroidism, adult   . Hyperlipidemia   . Essential hypertension   . Macular degeneration (senile) of retina   . Diaphragmatic hernia   . Insomnia   . Hereditary and idiopathic peripheral neuropathy   . Low back pain     Past Surgical History:  Procedure Laterality Date  .  CATARACT EXTRACTION, BILATERAL    . CHOLECYSTECTOMY  07/25/2007  . TONSILLECTOMY AND ADENOIDECTOMY Bilateral 1943     OB History   No obstetric history on file.     Family History  Problem Relation Age of Onset  . Hypertension Brother   . Fibromyalgia Daughter   . Hypertension Son   . Hypertension Brother   . Hyperlipidemia Brother   . Breast cancer Neg Hx     Social History   Tobacco Use  . Smoking status: Never Smoker  . Smokeless tobacco: Never Used  Substance Use Topics  . Alcohol use: No    Alcohol/week: 0.0 standard drinks  .  Drug use: No    Home Medications Prior to Admission medications   Medication Sig Start Date End Date Taking? Authorizing Provider  acetaminophen (TYLENOL) 500 MG tablet Take 500 mg by mouth every 4 (four) hours as needed.    [provider]  ALPRAZolam Prudy Feeler) 0.5 MG tablet TAKE 1 TABLET BY MOUTH THREE TIMES A DAY AS NEEDED FOR ANXIETY 10/21/19   Reed, Tiffany L, DO  doxazosin (CARDURA) 4 MG tablet TAKE 1 TABLET BY MOUTH EVERY DAY 08/27/19   Reed, Tiffany L, DO  indapamide (LOZOL) 1.25 MG tablet Take 1 tablet (1.25 mg total) by mouth every other day. 12/24/18   Reed, Tiffany L, DO  irbesartan (AVAPRO) 150 MG tablet Take 1 tablet (150 mg total) by mouth daily. 04/17/19   Reed, Tiffany L, DO  KLOR-CON M20 20 MEQ tablet TAKE 1 TABLET BY MOUTH EVERY DAY 10/07/19   Reed, Tiffany L, DO  levothyroxine (SYNTHROID) 100 MCG tablet TAKE 1 TABLET BY MOUTH EVERY DAY BEFORE BREAKFAST 01/23/19   Reed, Tiffany L, DO  loratadine (CLARITIN) 10 MG tablet Take 10 mg by mouth daily.     [provider]  metoprolol succinate (TOPROL-XL) 100 MG 24 hr tablet TAKE 1 TABLET BY MOUTH EVERY DAY WITH A MEAL TO CONTROL BLOOD PRESSURE 09/24/19   Reed, Tiffany L, DO  pantoprazole (PROTONIX) 40 MG tablet TAKE 1 TABLET BY MOUTH EVERY DAY 09/25/19   Reed, Tiffany L, DO  polyethylene glycol (MIRALAX / GLYCOLAX) packet Take 17 g by mouth daily.     [provider]  Propylene Glycol (SYSTANE BALANCE) 0.6 % SOLN Place 1 drop into both eyes 2 (two) times daily.    [provider]  sertraline (ZOLOFT) 50 MG tablet TAKE 1 TABLET BY MOUTH EVERY DAY TO CALM NERVES. 10/07/19   Reed, Tiffany L, DO  temazepam (RESTORIL) 30 MG capsule TAKE 1 CAPSULE BY MOUTH AT BEDTIME AS NEEDED FOR SLEEP 06/10/19   Reed, Tiffany L, DO    Allergies    Ambien [zolpidem tartrate], Bactrim [sulfamethoxazole-trimethoprim], Morphine and related, and Zithromax [azithromycin]  Review of Systems   Review of Systems 10 Systems reviewed  and are negative for acute change except as noted in the HPI.  Physical Exam Updated Vital Signs BP (!) 179/120   Pulse (!) 106   Temp 98.8 F (37.1 C) (Oral)   Resp 19   SpO2 97%   Physical Exam Constitutional:      Comments: Alert.  No respiratory distress.  Thin but well-nourished well-developed good condition for age.  HENT:     Head: Normocephalic and atraumatic.  Eyes:     Extraocular Movements: Extraocular movements intact.  Cardiovascular:     Comments: Tachycardia.  Irregularly irregular. Pulmonary:     Effort: Pulmonary effort is normal.     Breath sounds:  Normal breath sounds.  Abdominal:     General: There is no distension.     Palpations: Abdomen is soft.     Tenderness: There is no abdominal tenderness. There is no guarding.  Musculoskeletal:        General: No swelling or tenderness. Normal range of motion.     Cervical back: Neck supple.     Right lower leg: No edema.     Left lower leg: No edema.  Skin:    General: Skin is warm and dry.  Neurological:     General: No focal deficit present.     Mental Status: She is oriented to person, place, and time.     Coordination: Coordination normal.  Psychiatric:        Mood and Affect: Mood normal.     ED Results / Procedures / Treatments   Labs (all labs ordered are listed, but only abnormal results are displayed) Labs Reviewed  CBC WITH DIFFERENTIAL/PLATELET  BASIC METABOLIC PANEL  URINALYSIS, ROUTINE W REFLEX MICROSCOPIC  MAGNESIUM  BRAIN NATRIURETIC PEPTIDE  TSH  PROTIME-INR  TROPONIN I (HIGH SENSITIVITY)    EKG EKG Interpretation  Date/Time:  Sunday November 17 2019 12:05:57 EDT Ventricular Rate:  107 PR Interval:    QRS Duration: 67 QT Interval:  348 QTC Calculation: 465 R Axis:   72 Text Interpretation: Atrial fibrillation Ventricular premature complex Anteroseptal infarct, old Nonspecific repol abnormality, diffuse leads agree. lateral t wave depression similar to old Confirmed by  Arby Barrette [phone]) on 11/17/2019 12:50:03 PM   Radiology DG Chest Port 1 View  Result Date: 11/17/2019 CLINICAL DATA:  Atrial fibrillation EXAM: PORTABLE CHEST 1 VIEW COMPARISON:  August 02, 2016. FINDINGS: There is no evident edema or airspace opacity. Heart is upper normal in size with pulmonary vascularity normal. No adenopathy. There is aortic atherosclerosis. No bone lesions. IMPRESSION: No edema or airspace opacity. Stable cardiac silhouette. Aortic Atherosclerosis (ICD10-I70.0). Electronically Signed   By: Bretta Bang III M.D.   On: 11/17/2019 12:34    Procedures Procedures (including critical care time) CRITICAL CARE Performed by: Arby Barrette   Total critical care time: 30  minutes  Critical care time was exclusive of separately billable procedures and treating other patients.  Critical care was necessary to treat or prevent imminent or life-threatening deterioration.  Critical care was time spent personally by me on the following activities: development of treatment plan with patient and/or surrogate as well as nursing, discussions with consultants, evaluation of patient's response to treatment, examination of patient, obtaining history from patient or surrogate, ordering and performing treatments and interventions, ordering and review of laboratory studies, ordering and review of radiographic studies, pulse oximetry and re-evaluation of patient's condition.    CHA2DS2-VASc Score =  2 The patient's score is based upon: Hypertension and age        ASSESSMENT AND PLAN:    Signed,  Arby Barrette, MD    11/17/2019 1:08 PM   Medications Ordered in ED Medications  diltiazem (CARDIZEM) 1 mg/mL load via infusion 10 mg (has no administration in time range)    And  diltiazem (CARDIZEM) 125 mg in dextrose 5% 125 mL (1 mg/mL) infusion (has no administration in time range)    ED Course  I have reviewed the triage vital signs and the nursing notes.  Pertinent  labs & imaging results that were available during my care of the patient were reviewed by me and considered in my medical decision making (see chart for details).  MDM Rules/Calculators/A&P                      Patient presents with atrial fibrillation.  She does not have documented history of atrial fibrillation.  She does not describe fairly frequent episodes of palpitations and sometimes elevated heart rate.  I have suspicion for paroxysmal atrial fibrillation.  Patient takes Toprol 100 mg daily.  She had already taken her dose this morning.  At this time will give dose of Cardizem for rate control.  Rates have improved spontaneously down to the lower 100s since in the emergency department.  She was significantly hypertensive on arrival but pressures have now trended down to 160s/100.  She does not have any active chest pain.  Patient has had good response to Cardizem.  Heart rate has been controlled in the 60s.  Pressures are stable.  Normotensive.  Will add Cardizem to daily medications with parameters for holding medication.  Patient will be following up with her PCP tomorrow as scheduled.  She is instructed to review the risks and benefits of anticoagulations with paroxysmal or chronic atrial fibrillation.  Patient has had frequent falls.  At this time appears to have bleeding risk from falls that outweigh empiric anticoagulation.  Patient's daughter is present.  She will be able to assist the patient at home and help monitor. Final Clinical Impression(s) / ED Diagnoses Final diagnoses:  Atrial fibrillation with rapid ventricular response [facility])    Rx / DC Orders ED Discharge Orders    None       Arby Barrette, MD 11/17/19 1551

## 2019-11-17 NOTE — Discharge Instructions (Addendum)
1.  Continue your Toprol as prescribed.  Monitor your heart rate.  Start Cardizem as prescribed tomorrow.  If your heart rate is less than 70, do not take your dose of Cardizem.  Toprol also works to control the heart rate. 2.  See Dr. Renato Gails tomorrow as scheduled.  Discussed the risks and benefits of taking blood thinner medications with atrial fibrillation. 3.  Return to the emergency department if you have recurrence of symptoms, chest pain, or other concerning symptoms.

## 2019-11-17 NOTE — ED Notes (Signed)
Patient Alert and oriented to baseline. Stable and ambulatory to baseline. Patient verbalized understanding of the discharge instructions.  Patient belongings were taken by the patient.   

## 2019-11-18 ENCOUNTER — Ambulatory Visit (INDEPENDENT_AMBULATORY_CARE_PROVIDER_SITE_OTHER): Payer: Medicare Other | Admitting: Internal Medicine

## 2019-11-18 ENCOUNTER — Encounter: Payer: Self-pay | Admitting: Internal Medicine

## 2019-11-18 VITALS — BP 130/86 | HR 86 | Temp 97.7°F | Ht 63.0 in | Wt 115.6 lb

## 2019-11-18 DIAGNOSIS — I480 Paroxysmal atrial fibrillation: Secondary | ICD-10-CM | POA: Diagnosis not present

## 2019-11-18 DIAGNOSIS — H3530 Unspecified macular degeneration: Secondary | ICD-10-CM | POA: Diagnosis not present

## 2019-11-18 DIAGNOSIS — R2681 Unsteadiness on feet: Secondary | ICD-10-CM | POA: Diagnosis not present

## 2019-11-18 DIAGNOSIS — F411 Generalized anxiety disorder: Secondary | ICD-10-CM | POA: Diagnosis not present

## 2019-11-18 DIAGNOSIS — R600 Localized edema: Secondary | ICD-10-CM

## 2019-11-18 DIAGNOSIS — Z20828 Contact with and (suspected) exposure to other viral communicable diseases: Secondary | ICD-10-CM | POA: Diagnosis not present

## 2019-11-18 DIAGNOSIS — Z1159 Encounter for screening for other viral diseases: Secondary | ICD-10-CM | POA: Diagnosis not present

## 2019-11-18 DIAGNOSIS — I10 Essential (primary) hypertension: Secondary | ICD-10-CM | POA: Diagnosis not present

## 2019-11-18 MED ORDER — FUROSEMIDE 20 MG PO TABS: 20.0000 mg | ORAL_TABLET | ORAL | 3 refills | Status: DC

## 2019-11-18 MED ORDER — APIXABAN 2.5 MG PO TABS: 2.5000 mg | ORAL_TABLET | Freq: Two times a day (BID) | ORAL | 3 refills | Status: DC

## 2019-11-18 NOTE — Progress Notes (Signed)
Location:  [facility] clinic Provider:  [place] Rollyson L. Renato Gails, D.O., C.M.D.  Code Status: DNR Goals of Care:  Advanced Directives 11/18/2019  Does Patient Have a Medical Advance Directive? Yes  Type of Advance Directive Out of facility DNR (pink MOST or yellow form)  Does patient want to make changes to medical advance directive? No - Patient declined  Copy of Healthcare Power of Attorney in Chart? Yes - validated most recent copy scanned in chart (See row information)  Would patient like information on creating a medical advance directive? -  Pre-existing out of facility DNR order (yellow form or pink MOST form) Pink MOST/Yellow Form most recent copy in chart - Physician notified to receive inpatient order   Chief Complaint  Patient presents with  . Medical Management of Chronic Issues    BP follow up     HPI: Patient is a 86 y.o. female seen today for follow-up on BP mgt and after ED visit for afib with RVR.   Thursday, had her sinking feeling early on.  bp kept saying error, hi.   Thought bp was high and she called ems who took her bp and it had come down by then. Friday, she tried to get hooked up about her bp checks as needed at [facility] but it was a project to get signed up. She went to meals in b/w.   Sunday morning, when she got up she felt her heart was beating like a drum. EMS took her and he noted afib en route.  cardizem was given in ED.  It brought her heart rate down, bp came down also.  HR improved to 60s.  She has not yet taken the cardizem which is only to be used if HR over 70.  HR this am was 86.   Yesterday, when she got home from ED, she was nervous.   She is now interested in coming off lozol finally which she's been on qod.  She got up every 2 hrs to urinate.   She was not immediately started on NOAC due to fall risk also.  Vision poor.    She also has a large bruise on her left forearm from her IV which required a pressure dressing due to a hematoma yesterday.   Past  Medical History:  Diagnosis Date  . Allergic rhinitis due to pollen   . Anxiety state, unspecified   . Calculus of kidney   . Cataract   . Diaphragmatic hernia without mention of obstruction or gangrene   . Diffuse cystic mastopathy   . Dizziness and giddiness   . GERD (gastroesophageal reflux disease)   . Hiatal hernia   . Insomnia, unspecified   . Insomnia, unspecified   . Lumbago   . Macular degeneration (senile) of retina, unspecified   . Open wound of toe(s), without mention of complication   . Osteoarthrosis, unspecified whether generalized or localized, unspecified site   . Other and unspecified hyperlipidemia   . Personal history of fall   . Sciatica   . Sebaceous cyst   . Senile osteoporosis   . Unspecified constipation   . Unspecified essential hypertension   . Unspecified hereditary and idiopathic peripheral neuropathy   . Unspecified hypothyroidism   . Unspecified tinnitus     Past Surgical History:  Procedure Laterality Date  . CATARACT EXTRACTION, BILATERAL    . CHOLECYSTECTOMY  07/25/2007  . TONSILLECTOMY AND ADENOIDECTOMY Bilateral 1943    Allergies  Allergen Reactions  . Ambien [Zolpidem Tartrate]  hallucination  . Bactrim [Sulfamethoxazole-Trimethoprim]     Unknown reaction   . Morphine And Related Nausea And Vomiting  . Zithromax [Azithromycin]     Not effective      Facility-Administered Encounter Medications as of 11/18/2019  Medication  . diltiazem (CARDIZEM) 125 mg in dextrose 5% 125 mL (1 mg/mL) infusion   Outpatient Encounter Medications as of 11/18/2019  Medication Sig  . acetaminophen (TYLENOL) 500 MG tablet Take 500 mg by mouth every 4 (four) hours as needed.  . ALPRAZolam (XANAX) 0.5 MG tablet TAKE 1 TABLET BY MOUTH THREE TIMES A DAY AS NEEDED FOR ANXIETY  . diltiazem (CARDIZEM CD) 120 MG 24 hr capsule Take 1 capsule (120 mg total) by mouth daily.  Marland Kitchen doxazosin (CARDURA) 4 MG tablet TAKE 1 TABLET BY MOUTH EVERY DAY  . indapamide  (LOZOL) 1.25 MG tablet Take 1 tablet (1.25 mg total) by mouth every other day.  . irbesartan (AVAPRO) 150 MG tablet Take 1 tablet (150 mg total) by mouth daily.  Marland Kitchen KLOR-CON M20 20 MEQ tablet TAKE 1 TABLET BY MOUTH EVERY DAY  . levothyroxine (SYNTHROID) 100 MCG tablet TAKE 1 TABLET BY MOUTH EVERY DAY BEFORE BREAKFAST  . loratadine (CLARITIN) 10 MG tablet Take 10 mg by mouth daily.   . metoprolol succinate (TOPROL-XL) 100 MG 24 hr tablet TAKE 1 TABLET BY MOUTH EVERY DAY WITH A MEAL TO CONTROL BLOOD PRESSURE  . pantoprazole (PROTONIX) 40 MG tablet TAKE 1 TABLET BY MOUTH EVERY DAY  . polyethylene glycol (MIRALAX / GLYCOLAX) packet Take 17 g by mouth daily.   Marland Kitchen Propylene Glycol (SYSTANE BALANCE) 0.6 % SOLN Place 1 drop into both eyes 2 (two) times daily.  . sertraline (ZOLOFT) 50 MG tablet TAKE 1 TABLET BY MOUTH EVERY DAY TO CALM NERVES.  Marland Kitchen temazepam (RESTORIL) 30 MG capsule TAKE 1 CAPSULE BY MOUTH AT BEDTIME AS NEEDED FOR SLEEP    Review of Systems:  Review of Systems  Constitutional: Positive for malaise/fatigue. Negative for chills and fever.  HENT: Positive for hearing loss.   Eyes: Positive for blurred vision.  Respiratory: Negative for cough and shortness of breath.   Cardiovascular: Positive for palpitations. Negative for chest pain, orthopnea, claudication, leg swelling and PND.       Using compression hose  Gastrointestinal: Negative for abdominal pain.  Genitourinary: Positive for frequency. Negative for dysuria and urgency.  Musculoskeletal: Positive for falls and joint pain.  Skin: Negative for itching and rash.  Neurological: Negative for dizziness and loss of consciousness.  Endo/Heme/Allergies: Bruises/bleeds easily.  Psychiatric/Behavioral: Negative for memory loss. The patient is nervous/anxious.     Health Maintenance  Topic Date Due  . INFLUENZA VACCINE  03/15/2020  . TETANUS/TDAP  06/16/2027  . DEXA SCAN  Completed  . PNA vac Low Risk Adult  Completed    Physical  Exam: Vitals:   11/18/19 1108  BP: 130/86  Pulse: 86  Temp: 97.7 F (36.5 C)  TempSrc: Temporal  SpO2: 92%  Weight: 115 lb 9.6 oz (52.4 kg)  Height: 5\' 3"  (1.6 m)   Body mass index is 20.48 kg/m. Physical Exam Vitals reviewed.  Constitutional:      General: She is not in acute distress.    Appearance: She is not ill-appearing or toxic-appearing.     Comments: Thin female ambulating with rolling walker with skis  HENT:     Head: Normocephalic and atraumatic.  Eyes:     Comments: Glasses, poor eye contact due to poor vision  Cardiovascular:     Rate and Rhythm: Rhythm irregular.     Heart sounds: No murmur. No friction rub. No gallop.   Pulmonary:     Effort: Pulmonary effort is normal.     Breath sounds: Normal breath sounds. No rales.  Musculoskeletal:        General: Normal range of motion.     Right lower leg: No edema.     Left lower leg: No edema.  Skin:    General: Skin is warm and dry.     Comments: Large ecchymoses of left forearm/hand--dark purple/black in color; when dressing removed, no further bleeding and actual stick site could not be located  Neurological:     General: No focal deficit present.     Mental Status: She is alert and oriented to person, place, and time. Mental status is at baseline.     Comments: Ambulates with rolling walker with skis     Labs reviewed: Basic Metabolic Panel: Recent Labs    12/05/18 1011 08/28/19 0957 11/17/19 1224  NA 132* 130* 129*  K 3.3* 3.6 4.2  CL 88* 87* 87*  CO2 36* 37* 31  GLUCOSE 109* 97 131*  BUN 20 17 12   CREATININE 0.83 0.71 0.72  CALCIUM 9.8 9.6 9.3  MG  --   --  1.7  TSH 4.73*  --  3.002   Liver Function Tests: Recent Labs    12/05/18 1011  AST 14  ALT 9  BILITOT 0.6  PROT 6.5   No results for input(s): LIPASE, AMYLASE in the last 8760 hours. No results for input(s): AMMONIA in the last 8760 hours. CBC: Recent Labs    12/05/18 1011 11/17/19 1224  WBC 4.1 4.8  NEUTROABS 2,968 3.8    HGB 13.3 14.1  HCT 38.9 42.9  MCV 87.2 88.6  PLT 141 117*   Lipid Panel: No results for input(s): CHOL, HDL, LDLCALC, TRIG, CHOLHDL, LDLDIRECT in the last 8760 hours. Lab Results  Component Value Date   HGBA1C  07/23/2007    5.7 (NOTE)   The ADA recommends the following therapeutic goals for glycemic   control related to Hgb A1C measurement:   Goal of Therapy:   < 7.0% Hgb A1C   Action Suggested:  > 8.0% Hgb A1C   Ref:  Diabetes Care, 22, Suppl. 1, 1999    Procedures since last visit: DG Chest Port 1 View  Result Date: 11/17/2019 CLINICAL DATA:  Atrial fibrillation EXAM: PORTABLE CHEST 1 VIEW COMPARISON:  August 02, 2016. FINDINGS: There is no evident edema or airspace opacity. Heart is upper normal in size with pulmonary vascularity normal. No adenopathy. There is aortic atherosclerosis. No bone lesions. IMPRESSION: No edema or airspace opacity. Stable cardiac silhouette. Aortic Atherosclerosis (ICD10-I70.0). Electronically Signed   By: August 04, 2016 III M.D.   On: 11/17/2019 12:34    Assessment/Plan 1. Paroxysmal atrial fibrillation (HCC) - new onset with RVR over the weekend - was started on prn cardizem 120mg  for HR over 70 at ED and advised to f/u here -cont the cardizem prn--their daughter is getting them a pulse ox to check HR - discussed pros and cons (CHADS2vasc and HASBLED) of anticoagulation for afib stroke prevention with patient, her husband and her daughter and did recommend NOAC for her due to stroke risk being greater than intracranial hemorrhage or gi bleed risk (though she is prone to falls with her poor vision and generalized weakness) - apixaban (ELIQUIS) 2.5 MG TABS tablet; Take 1 tablet (  2.5 mg total) by mouth 2 (two) times daily.  Dispense: 60 tablet; Refill: 3  2. Localized edema -continue compression hose -will stop indapamide which I really wanted her off of for a long time anyway b/c she has to get up to urinate too much with it and it wipes her out  (aka "the sinking feeling) b/c they do not hydrate adequately  3. Essential hypertension -bp had typically been controlled until this run of afib -stop diuretic in case of needing cardizem so bp does not get too low -may be able to stop cardura too if bps when checked by facility nurse are on low side  -keep f/u as scheduled 12/02/2019  4. Anxiety state -continues her xanax for this--attempts to wean have NOT been a success  5. Unsteady gait -cont walker use at all times -glad they are willing to start using the nurse services at [facility]--they are gradually moving toward an AL need as her husband's memory is declining and her vision is so poor  6. Macular degeneration (senile) of retina -advanced, cont ophtho f/u and doing things slowly and carefully, using walker to prevent falls  Labs/tests ordered:    Lab Orders     CBC with Differential/Platelet     Basic metabolic panel   Next appt:  12/02/2019   Margaurite Salido L. Danita Proud, D.O. Geriatrics [facility] [address], [place] [address] Cell Phone (Mon-Fri 8am-5pm):  [phone] On Call:  [phone] & follow prompts after 5pm & weekends Office Phone:  [phone] Office Fax:  [phone]

## 2019-11-18 NOTE — Patient Instructions (Addendum)
Take cardizem 120mg  daily ONLY if HR is over 70. Take eliquis 2.5mg  twice a day for thinning your blood to prevent stroke from atrial fibrillation.   Let's avoid using a diuretic for now and monitor.  Use your compression hose.

## 2019-11-19 ENCOUNTER — Telehealth (HOSPITAL_COMMUNITY): Payer: Self-pay

## 2019-11-19 NOTE — Telephone Encounter (Signed)
Patient showed up on ED report. Reached out to schedule follow-up appointment. She stated that she will reached out to her PCP to see if she needs this appointment.

## 2019-11-21 ENCOUNTER — Ambulatory Visit (HOSPITAL_COMMUNITY)
Admission: RE | Admit: 2019-11-21 | Discharge: 2019-11-21 | Disposition: A | Discharge status: Home / Self Care | Payer: Medicare Other | Source: Ambulatory Visit | Attending: Nurse Practitioner | Admitting: Nurse Practitioner

## 2019-11-21 ENCOUNTER — Encounter (HOSPITAL_COMMUNITY): Payer: Self-pay | Admitting: Nurse Practitioner

## 2019-11-21 VITALS — BP 150/86 | HR 69 | Ht 63.0 in | Wt 119.2 lb

## 2019-11-21 DIAGNOSIS — I10 Essential (primary) hypertension: Secondary | ICD-10-CM | POA: Diagnosis not present

## 2019-11-21 DIAGNOSIS — Z79890 Hormone replacement therapy (postmenopausal): Secondary | ICD-10-CM | POA: Diagnosis not present

## 2019-11-21 DIAGNOSIS — F419 Anxiety disorder, unspecified: Secondary | ICD-10-CM | POA: Insufficient documentation

## 2019-11-21 DIAGNOSIS — I4891 Unspecified atrial fibrillation: Secondary | ICD-10-CM

## 2019-11-21 DIAGNOSIS — D6869 Other thrombophilia: Secondary | ICD-10-CM

## 2019-11-21 DIAGNOSIS — M810 Age-related osteoporosis without current pathological fracture: Secondary | ICD-10-CM | POA: Insufficient documentation

## 2019-11-21 DIAGNOSIS — E039 Hypothyroidism, unspecified: Secondary | ICD-10-CM | POA: Diagnosis not present

## 2019-11-21 DIAGNOSIS — Z7901 Long term (current) use of anticoagulants: Secondary | ICD-10-CM | POA: Insufficient documentation

## 2019-11-21 DIAGNOSIS — Z79899 Other long term (current) drug therapy: Secondary | ICD-10-CM | POA: Insufficient documentation

## 2019-11-21 NOTE — Progress Notes (Signed)
Primary Care Physician: Kermit Balo, DO Referring Physician: [facility] er f/u   [name] is a 86 y.o. female with a h/o HTN, that was seen in the ER 4/4 with new onset afib.   She reported abrupt heart racing and dizziness with v rates in the 120-130's. However, she did remember spells of palpitations prior to today. At baseline she is on metoprolol xl 100 mg at hs for HTN. She has a CHA2DS2VASc score of 4. She was not started on anticoagulation due to concerns for falls. She had good response to IV cardizem and was d/c with afib in the 60's. Cardizem po was added.  She was seen by Dr. Renato Gails 4/5. Eliquis 2.5 mg bid was started. Cardizem is taken only if HR is over 60 bpm in the am. Today EKG shows afib at 69 bpm.pt does not feel the afib, thought she may be rhythm. Her brother is here with her today.   Today, she denies symptoms of palpitations, chest pain, shortness of breath, orthopnea, PND, lower extremity edema, dizziness, presyncope, syncope, or neurologic sequela. The patient is tolerating medications without difficulties and is otherwise without complaint today.   Past Medical History:  Diagnosis Date  . Allergic rhinitis due to pollen   . Anxiety state, unspecified   . Calculus of kidney   . Cataract   . Diaphragmatic hernia without mention of obstruction or gangrene   . Diffuse cystic mastopathy   . Dizziness and giddiness   . GERD (gastroesophageal reflux disease)   . Hiatal hernia   . Insomnia, unspecified   . Insomnia, unspecified   . Lumbago   . Macular degeneration (senile) of retina, unspecified   . Open wound of toe(s), without mention of complication   . Osteoarthrosis, unspecified whether generalized or localized, unspecified site   . Other and unspecified hyperlipidemia   . Personal history of fall   . Sciatica   . Sebaceous cyst   . Senile osteoporosis   . Unspecified constipation   . Unspecified essential hypertension   . Unspecified hereditary and  idiopathic peripheral neuropathy   . Unspecified hypothyroidism   . Unspecified tinnitus    Past Surgical History:  Procedure Laterality Date  . CATARACT EXTRACTION, BILATERAL    . CHOLECYSTECTOMY  07/25/2007  . TONSILLECTOMY AND ADENOIDECTOMY Bilateral 1943    Current Outpatient Medications  Medication Sig Dispense Refill  . acetaminophen (TYLENOL) 500 MG tablet Take 500 mg by mouth every 4 (four) hours as needed.    . ALPRAZolam (XANAX) 0.5 MG tablet TAKE 1 TABLET BY MOUTH THREE TIMES A DAY AS NEEDED FOR ANXIETY 90 tablet 5  . apixaban (ELIQUIS) 2.5 MG TABS tablet Take 1 tablet (2.5 mg total) by mouth 2 (two) times daily. 60 tablet 3  . diltiazem (CARDIZEM CD) 120 MG 24 hr capsule Take 1 capsule (120 mg total) by mouth daily. 30 capsule 1  . doxazosin (CARDURA) 4 MG tablet TAKE 1 TABLET BY MOUTH EVERY DAY 90 tablet 1  . irbesartan (AVAPRO) 150 MG tablet Take 1 tablet (150 mg total) by mouth daily. 90 tablet 3  . KLOR-CON M20 20 MEQ tablet TAKE 1 TABLET BY MOUTH EVERY DAY 90 tablet 1  . levothyroxine (SYNTHROID) 100 MCG tablet TAKE 1 TABLET BY MOUTH EVERY DAY BEFORE BREAKFAST 90 tablet 3  . loratadine (CLARITIN) 10 MG tablet Take 10 mg by mouth daily.     . metoprolol succinate (TOPROL-XL) 100 MG 24 hr tablet TAKE 1 TABLET  BY MOUTH EVERY DAY WITH A MEAL TO CONTROL BLOOD PRESSURE 90 tablet 1  . pantoprazole (PROTONIX) 40 MG tablet TAKE 1 TABLET BY MOUTH EVERY DAY 90 tablet 1  . polyethylene glycol (MIRALAX / GLYCOLAX) packet Take 17 g by mouth daily.     Marland Kitchen Propylene Glycol (SYSTANE BALANCE) 0.6 % SOLN Place 1 drop into both eyes 2 (two) times daily.    . sertraline (ZOLOFT) 50 MG tablet TAKE 1 TABLET BY MOUTH EVERY DAY TO CALM NERVES. 90 tablet 1  . temazepam (RESTORIL) 30 MG capsule TAKE 1 CAPSULE BY MOUTH AT BEDTIME AS NEEDED FOR SLEEP 30 capsule 5   No current facility-administered medications for this encounter.    Allergies  Allergen Reactions  . Ambien [Zolpidem Tartrate]      hallucination  . Bactrim [Sulfamethoxazole-Trimethoprim]     Unknown reaction   . Morphine And Related Nausea And Vomiting  . Zithromax [Azithromycin]     Not effective      Social History   Socioeconomic History  . Marital status: Married    Spouse name: Not on file  . Number of children: 2  . Years of education: Not on file  . Highest education level: Not on file  Occupational History  . Occupation: retired  Tobacco Use  . Smoking status: Never Smoker  . Smokeless tobacco: Never Used  Substance and Sexual Activity  . Alcohol use: No    Alcohol/week: 0.0 standard drinks  . Drug use: No  . Sexual activity: Never  Other Topics Concern  . Not on file  Social History Narrative  . Not on file   Social Determinants of Health   Financial Resource Strain:   . Difficulty of Paying Living Expenses:   Food Insecurity:   . Worried About Charity fundraiser in the Last Year:   . Arboriculturist in the Last Year:   Transportation Needs:   . Film/video editor (Medical):   Marland Kitchen Lack of Transportation (Non-Medical):   Physical Activity:   . Days of Exercise per Week:   . Minutes of Exercise per Session:   Stress:   . Feeling of Stress :   Social Connections:   . Frequency of Communication with Friends and Family:   . Frequency of Social Gatherings with Friends and Family:   . Attends Religious Services:   . Active Member of Clubs or Organizations:   . Attends Archivist Meetings:   Marland Kitchen Marital Status:   Intimate Partner Violence:   . Fear of Current or Ex-Partner:   . Emotionally Abused:   Marland Kitchen Physically Abused:   . Sexually Abused:     Family History  Problem Relation Age of Onset  . Hypertension Brother   . Fibromyalgia Daughter   . Hypertension Son   . Hypertension Brother   . Hyperlipidemia Brother   . Breast cancer Neg Hx     ROS- All systems are reviewed and negative except as per the HPI above  Physical Exam: There were no vitals filed for this  visit. Wt Readings from Last 3 Encounters:  11/18/19 115 lb 9.6 oz (52.4 kg)  10/29/19 116 lb (52.6 kg)  09/02/19 115 lb 3.2 oz (52.3 kg)    Labs: Lab Results  Component Value Date   NA 129 (L) 11/17/2019   K 4.2 11/17/2019   CL 87 (L) 11/17/2019   CO2 31 11/17/2019   GLUCOSE 131 (H) 11/17/2019   BUN 12 11/17/2019  CREATININE 0.72 11/17/2019   CALCIUM 9.3 11/17/2019   PHOS 2.1 (L) 08/02/2016   MG 1.7 11/17/2019   Lab Results  Component Value Date   INR 1.0 11/17/2019   Lab Results  Component Value Date   CHOL 175 03/05/2018   HDL 59 03/05/2018   LDLCALC 92 03/05/2018   TRIG 147 03/05/2018     GEN- The patient is well appearing, alert and oriented x 3 today.   Head- normocephalic, atraumatic Eyes-  Sclera clear, conjunctiva pink Ears- hearing intact Oropharynx- clear Neck- supple, no JVP Lymph- no cervical lymphadenopathy Lungs- Clear to ausculation bilaterally, normal work of breathing Heart- irregular rate and rhythm, no murmurs, rubs or gallops, PMI not laterally displaced GI- soft, NT, ND, + BS Extremities- no clubbing, cyanosis, or edema MS- no significant deformity or atrophy Skin- no rash or lesion Psych- euthymic mood, full affect Neuro- strength and sensation are intact  EKG-afib at 69 bpm.     Assessment and Plan:  1. Newly diagnosed afib General education re afib  Pt unaware of arrhythmia this am   Continue  metoprolol succinate 25 mg daily(previoulsy on for tx of HTN) She will take cardizem 120 mg daily if HR is over 70 bpm in am   2. HTN Stable   3. CHA2DS2VASc score of 4 She has been placed on eliquis 2.5 mg bid  Bleeding precautions/fall precautions discussed  Risk of falls, but, her risk of stroke is greater at this point Reminded not to miss doses   I will see back the first of May and will further discuss at that time, if still in afib/symptomatic, if  DCCV should be pursued, granted 3 weeks of uninterrupted anticoagultion.   Rate control may  be her treatment plan  going forward.  She would like to possibly be referred to Dr. Jodi Geralds a later date as her husband is a pt of his.  Elvina Sidle [facility] [address] [place], [place] [address] [phone]

## 2019-11-22 ENCOUNTER — Telehealth (INDEPENDENT_AMBULATORY_CARE_PROVIDER_SITE_OTHER): Payer: Medicare Other | Admitting: Family

## 2019-11-22 ENCOUNTER — Telehealth: Payer: Self-pay | Admitting: *Deleted

## 2019-11-22 ENCOUNTER — Other Ambulatory Visit: Payer: Self-pay

## 2019-11-22 ENCOUNTER — Encounter: Payer: Self-pay | Admitting: Family

## 2019-11-22 DIAGNOSIS — I10 Essential (primary) hypertension: Secondary | ICD-10-CM

## 2019-11-22 DIAGNOSIS — I480 Paroxysmal atrial fibrillation: Secondary | ICD-10-CM | POA: Diagnosis not present

## 2019-11-22 DIAGNOSIS — R29898 Other symptoms and signs involving the musculoskeletal system: Secondary | ICD-10-CM

## 2019-11-22 MED ORDER — METOPROLOL SUCCINATE ER 25 MG PO TB24: 25.0000 mg | ORAL_TABLET | Freq: Every day | ORAL | 3 refills | Status: DC

## 2019-11-22 NOTE — Telephone Encounter (Signed)
Pt is scheduled of telephone visit today and will discuss with Dinah.

## 2019-11-22 NOTE — Patient Instructions (Addendum)
-   check blood pressure once daily in the morning and Notify provider  for B/p > 140/90 or HR > 100 b/min.  - Take Metoprolol succinate  25 mg XL tablet one by mouth daily as directed. - continue on EliQuis 2.5 mg tablet twice daily    - referral placed today for you to see Dr.Ganji. cardiologist office will call you to make an appointment.   - Notify provider or go to ED if symptoms worsen or fail to improve

## 2019-11-22 NOTE — Telephone Encounter (Signed)
Continue  metoprolol succinate 25 mg daily Take cardizem 120 mg daily if HR is over 70 bpm in am Continue eliquis 2.5 mg twice a day  Plan was for her to see cardiology 5/1 and if she was still in afib, consider cardioversion (shocking heart back into rhythm).  She has to be on the blood thinner for 3 weeks continuously for that to be a safe option.  She cannot miss doses.  If she'd like to see Dr. Jacinto Halim instead, that's fine with me and I can put in the referral--please confirm.

## 2019-11-22 NOTE — Telephone Encounter (Signed)
Pt calling confused on what she's supposed to do next? Pt stated she shouldn't be in afib this long, per pt her mother was in and out of afib in a day. Pt very confused about medications and when to take them? Pt wants to know why Dr. Renato Gails can't do what the afib clinic can do? Pt would like a referral to Dr. Nadara Eaton and I advised that the afib clinic is handling that. Pt states it was too long of a walk going into the hospital and would like to be closer to the door on these appointments. She actually had me confused on what she said she took today and didn't take yesterday.

## 2019-11-22 NOTE — Progress Notes (Signed)
Patient states was going out for breakfast this morning her legs felt very weak but has improved since then.she took her medication as directed except metoprolol .she was advised to take 25 mg tablet by [facility] clinic but states has been taking metoprolol succinate 100 mg tablet for a very long time for her blood pressure does not understand why she was advised to reduce down to 25 mg tablet.states HR was less than 70 last night so she did not take metoprolol.this morning HR was 82 so took metoprolol. Nurse present during visit checked B/p was 100/54. She had called prior to visit and Dr.Reed had advised her to continue onmetoprolol succinate 25 mg daily and  Take cardizem 120 mg daily if HR is over 70 bpm in am. Also to continue eliquis 2.5 mg twice a day.Plan was for her to see cardiology 5/1 and if she was still in afib, consider cardioversion (shocking heart back into rhythm).She has to be on the blood thinner for 3 weeks continuously for that to be a safe option.She cannot miss doses.she states would like to see Dr. Einar Gip instead of Af[facility] clinic since Husband follows up with Dr.Ganji. She denies any headache,dizziness,palpitation,chest pain or shortness of breath.bilateral leg weakness has resolved.  She states has stopped taking indapamide as directed.  Review of Systems  Constitutional: Negative for chills, fever and malaise/fatigue.  HENT: Negative for congestion, sinus pain and sore throat.   Respiratory: Negative for cough, shortness of breath and wheezing.   Cardiovascular: Negative for chest pain, palpitations and leg swelling.       Chronic Afib   Gastrointestinal: Negative for abdominal pain, constipation, diarrhea, heartburn, melena, nausea and vomiting.  Genitourinary: Negative for dysuria, flank pain, frequency and urgency.  Musculoskeletal: Negative for falls and myalgias.  Skin: Negative for itching.  Neurological: Negative for dizziness, sensory change, speech change, seizures,  loss of consciousness, weakness and headaches.       Had bilateral leg weakness this morning but has resolved.   Psychiatric/Behavioral: Negative for depression.   Plan  1. Paroxysmal atrial fibrillation (HCC) HR 82 b/min - Has been taking metoprolol succinate 100 mg XL tablet instead of 25 mg tablet as directed by MD.will send Metoprolol succinate (TOPROL-XL) 25 MG 24 hr tablet daily to her pharmacy.Agreed to take metoprolol 25 mg tablet as directed.  - continue on EliQuis 2.5 mg tablet twice daily.  - Advised to Notify provider or go to ED if HR > 100 b/min  - Ambulatory referral to Cardiology request to see Dr.Ganji. - metoprolol succinate (TOPROL-XL) 25 MG 24 hr tablet; Take 1 tablet (25 mg total) by mouth daily. Take with or immediately following a meal.  Dispense: 30 tablet; Refill: 3  2. Essential hypertension Low soft blood pressure today.Adjust metoprolol as above. - Advised to check B/p daily and notify provider's office if B/p > 140/90 or HR > 100 b/min.  - Ambulatory referral to Cardiology - metoprolol succinate (TOPROL-XL) 25 MG 24 hr tablet; Take 1 tablet (25 mg total) by mouth daily. Take with or immediately following a meal.  Dispense: 30 tablet; Refill: 3  3. Bilateral leg weakness Had weakness in the morning but has resolved.  - Notify provider or to ED if symptoms recurs or worsen.   Spent 27 minutes of non-face to face with patient I connected with  [name] on 11/22/19 by a video enabled telemedicine application and verified that I am speaking with the correct person using two identifiers.   I  discussed the limitations of evaluation and management by telemedicine. The patient expressed understanding and agreed to proceed.

## 2019-11-25 DIAGNOSIS — Z20828 Contact with and (suspected) exposure to other viral communicable diseases: Secondary | ICD-10-CM | POA: Diagnosis not present

## 2019-11-25 DIAGNOSIS — Z1159 Encounter for screening for other viral diseases: Secondary | ICD-10-CM | POA: Diagnosis not present

## 2019-12-02 ENCOUNTER — Encounter: Payer: Self-pay | Admitting: Internal Medicine

## 2019-12-02 ENCOUNTER — Ambulatory Visit (INDEPENDENT_AMBULATORY_CARE_PROVIDER_SITE_OTHER): Payer: Medicare Other | Admitting: Internal Medicine

## 2019-12-02 ENCOUNTER — Other Ambulatory Visit: Payer: Self-pay

## 2019-12-02 VITALS — BP 140/80 | HR 65 | Temp 97.3°F | Ht 63.0 in | Wt 117.6 lb

## 2019-12-02 DIAGNOSIS — I10 Essential (primary) hypertension: Secondary | ICD-10-CM

## 2019-12-02 DIAGNOSIS — M159 Polyosteoarthritis, unspecified: Secondary | ICD-10-CM

## 2019-12-02 DIAGNOSIS — Z20828 Contact with and (suspected) exposure to other viral communicable diseases: Secondary | ICD-10-CM | POA: Diagnosis not present

## 2019-12-02 DIAGNOSIS — H3530 Unspecified macular degeneration: Secondary | ICD-10-CM | POA: Diagnosis not present

## 2019-12-02 DIAGNOSIS — I480 Paroxysmal atrial fibrillation: Secondary | ICD-10-CM

## 2019-12-02 DIAGNOSIS — H5316 Psychophysical visual disturbances: Secondary | ICD-10-CM

## 2019-12-02 DIAGNOSIS — Z1159 Encounter for screening for other viral diseases: Secondary | ICD-10-CM | POA: Diagnosis not present

## 2019-12-02 MED ORDER — METOPROLOL SUCCINATE ER 50 MG PO TB24: 50.0000 mg | ORAL_TABLET | Freq: Every day | ORAL | 3 refills | Status: DC

## 2019-12-02 NOTE — Patient Instructions (Signed)
Increase your metoprolol to 50mg  daily.   Continue your blood pressure checks every other day.   Keep the appointment with Dr. partner and we'll cancel your [facility] appt as you request.

## 2019-12-02 NOTE — Progress Notes (Signed)
Location:  [facility] clinic Provider:  Avien Taha L. Renato Gails, D.O., C.M.D.  Code Status: DNR Goals of Care:  Advanced Directives 12/02/2019  Does Patient Have a Medical Advance Directive? Yes  Type of Estate agent of [place];Out of facility DNR (pink MOST or yellow form)  Does patient want to make changes to medical advance directive? No - Patient declined  Copy of Healthcare Power of Attorney in Chart? -  Would patient like information on creating a medical advance directive? -  Pre-existing out of facility DNR order (yellow form or pink MOST form) Pink MOST/Yellow Form most recent copy in chart - Physician notified to receive inpatient order     Chief Complaint  Patient presents with  . Medical Management of Chronic Issues    3 month follow up      HPI: Patient is a 86 y.o. female seen today for medical management of chronic diseases.  Mostly this is to f/u on her bp and afib.  She will be seeing Dr. Verl Dicker partner.  She does not want to go to [facility] cardiology due to the challenges getting there.    BP with wrist cuff is typically running 160s/70s to 90.   She is taking the metoprolol 25 and no longer taking indapamide.  She still gets up 3x at night to urinate.  [place]  She is taking the cardizem if needed for HR <70.  Did not take this am.  They're using the pulse ox to check the pulse.  She does wear her compression hose.  Legs feel weak with her progressive knee OA.  Uses her rolling walker with skis or rollator walker.    Past Medical History:  Diagnosis Date  . Allergic rhinitis due to pollen   . Anxiety state, unspecified   . Calculus of kidney   . Cataract   . Diaphragmatic hernia without mention of obstruction or gangrene   . Diffuse cystic mastopathy   . Dizziness and giddiness   . GERD (gastroesophageal reflux disease)   . Hiatal hernia   . Insomnia, unspecified   . Insomnia, unspecified   . Lumbago   .  Macular degeneration (senile) of retina, unspecified   . Open wound of toe(s), without mention of complication   . Osteoarthrosis, unspecified whether generalized or localized, unspecified site   . Other and unspecified hyperlipidemia   . Personal history of fall   . Sciatica   . Sebaceous cyst   . Senile osteoporosis   . Unspecified constipation   . Unspecified essential hypertension   . Unspecified hereditary and idiopathic peripheral neuropathy   . Unspecified hypothyroidism   . Unspecified tinnitus     Past Surgical History:  Procedure Laterality Date  . CATARACT EXTRACTION, BILATERAL    . CHOLECYSTECTOMY  07/25/2007  . TONSILLECTOMY AND ADENOIDECTOMY Bilateral 1943    Allergies  Allergen Reactions  . Ambien [Zolpidem Tartrate]     hallucination  . Bactrim [Sulfamethoxazole-Trimethoprim]     Unknown reaction   . Morphine And Related Nausea And Vomiting  . Zithromax [Azithromycin]     Not effective      Outpatient Encounter Medications as of 12/02/2019  Medication Sig  . acetaminophen (TYLENOL) 500 MG tablet Take 500 mg by mouth in the morning and at bedtime.   . ALPRAZolam (XANAX) 0.5 MG tablet TAKE 1 TABLET BY MOUTH THREE TIMES A DAY AS NEEDED FOR ANXIETY  . apixaban (ELIQUIS) 2.5 MG TABS tablet  Take 1 tablet (2.5 mg total) by mouth 2 (two) times daily.  Marland Kitchen diltiazem (CARDIZEM CD) 120 MG 24 hr capsule Take 1 capsule (120 mg total) by mouth daily.  Marland Kitchen doxazosin (CARDURA) 4 MG tablet TAKE 1 TABLET BY MOUTH EVERY DAY  . irbesartan (AVAPRO) 150 MG tablet Take 1 tablet (150 mg total) by mouth daily.  Marland Kitchen KLOR-CON M20 20 MEQ tablet TAKE 1 TABLET BY MOUTH EVERY DAY  . levothyroxine (SYNTHROID) 100 MCG tablet TAKE 1 TABLET BY MOUTH EVERY DAY BEFORE BREAKFAST  . loratadine (CLARITIN) 10 MG tablet Take 10 mg by mouth daily.   . metoprolol succinate (TOPROL-XL) 25 MG 24 hr tablet Take 1 tablet (25 mg total) by mouth daily. Take with or immediately following a meal.  . pantoprazole  (PROTONIX) 40 MG tablet TAKE 1 TABLET BY MOUTH EVERY DAY  . polyethylene glycol (MIRALAX / GLYCOLAX) packet Take 17 g by mouth daily.   Marland Kitchen Propylene Glycol (SYSTANE BALANCE) 0.6 % SOLN Place 1 drop into both eyes 2 (two) times daily.  . sertraline (ZOLOFT) 50 MG tablet TAKE 1 TABLET BY MOUTH EVERY DAY TO CALM NERVES.  Marland Kitchen temazepam (RESTORIL) 30 MG capsule TAKE 1 CAPSULE BY MOUTH AT BEDTIME AS NEEDED FOR SLEEP   No facility-administered encounter medications on file as of 12/02/2019.    Review of Systems:  Review of Systems  Constitutional: Negative for chills and fever.  HENT: Positive for hearing loss. Negative for congestion and sore throat.   Eyes: Positive for blurred vision.  Respiratory: Negative for cough and shortness of breath.   Cardiovascular: Positive for leg swelling. Negative for chest pain, palpitations, orthopnea and PND.       VERY minimal, wears compression hose  Gastrointestinal: Negative for abdominal pain, blood in stool, constipation, diarrhea and melena.  Genitourinary: Negative for dysuria.  Musculoskeletal: Positive for falls.  Neurological: Negative for dizziness and loss of consciousness.  Endo/Heme/Allergies: Bruises/bleeds easily.  Psychiatric/Behavioral: Negative for depression and memory loss. The patient is nervous/anxious. The patient does not have insomnia.     Health Maintenance  Topic Date Due  . COVID-19 Vaccine (1) Never done  . INFLUENZA VACCINE  03/15/2020  . TETANUS/TDAP  06/16/2027  . DEXA SCAN  Completed  . PNA vac Low Risk Adult  Completed    Physical Exam: Vitals:   12/02/19 1112  BP: 140/80  Pulse: 65  Temp: (!) 97.3 F (36.3 C)  TempSrc: Temporal  SpO2: 98%  Weight: 117 lb 9.6 oz (53.3 kg)  Height: 5\' 3"  (1.6 m)   Body mass index is 20.83 kg/m. Physical Exam Vitals reviewed.  Constitutional:      General: She is not in acute distress.    Appearance: She is not toxic-appearing.     Comments: Thin female walking with  rolling walker with skis  HENT:     Head: Normocephalic and atraumatic.  Cardiovascular:     Rate and Rhythm: Rhythm irregular.     Heart sounds: No murmur.  Pulmonary:     Effort: Pulmonary effort is normal.     Breath sounds: Normal breath sounds. No rales.  Musculoskeletal:        General: Normal range of motion.     Cervical back: Neck supple. No tenderness.     Comments: Very minimal nonpitting edema of ankles, wearing her compression hose  Lymphadenopathy:     Cervical: No cervical adenopathy.  Skin:    General: Skin is warm and dry.  Neurological:  General: No focal deficit present.     Mental Status: She is alert and oriented to person, place, and time.     Gait: Gait abnormal.  Psychiatric:        Mood and Affect: Mood normal.        Behavior: Behavior normal.     Comments: anxious     Labs reviewed: Basic Metabolic Panel: Recent Labs    12/05/18 1011 08/28/19 0957 11/17/19 1224  NA 132* 130* 129*  K 3.3* 3.6 4.2  CL 88* 87* 87*  CO2 36* 37* 31  GLUCOSE 109* 97 131*  BUN 20 17 12   CREATININE 0.83 0.71 0.72  CALCIUM 9.8 9.6 9.3  MG  --   --  1.7  TSH 4.73*  --  3.002   Liver Function Tests: Recent Labs    12/05/18 1011  AST 14  ALT 9  BILITOT 0.6  PROT 6.5   No results for input(s): LIPASE, AMYLASE in the last 8760 hours. No results for input(s): AMMONIA in the last 8760 hours. CBC: Recent Labs    12/05/18 1011 11/17/19 1224  WBC 4.1 4.8  NEUTROABS 2,968 3.8  HGB 13.3 14.1  HCT 38.9 42.9  MCV 87.2 88.6  PLT 141 117*   Lipid Panel: No results for input(s): CHOL, HDL, LDLCALC, TRIG, CHOLHDL, LDLDIRECT in the last 8760 hours. Lab Results  Component Value Date   HGBA1C  07/23/2007    5.7 (NOTE)   The ADA recommends the following therapeutic goals for glycemic   control related to Hgb A1C measurement:   Goal of Therapy:   < 7.0% Hgb A1C   Action Suggested:  > 8.0% Hgb A1C   Ref:  Diabetes Care, 22, Suppl. 1, 1999    Procedures since  last visit: DG Chest Port 1 View  Result Date: 11/17/2019 CLINICAL DATA:  Atrial fibrillation EXAM: PORTABLE CHEST 1 VIEW COMPARISON:  August 02, 2016. FINDINGS: There is no evident edema or airspace opacity. Heart is upper normal in size with pulmonary vascularity normal. No adenopathy. There is aortic atherosclerosis. No bone lesions. IMPRESSION: No edema or airspace opacity. Stable cardiac silhouette. Aortic Atherosclerosis (ICD10-I70.0). Electronically Signed   By: August 04, 2016 III M.D.   On: 11/17/2019 12:34    Assessment/Plan 1. Paroxysmal atrial fibrillation (HCC) - continue prn cardizem if HR over 70  2. Essential hypertension -bps are running in 160s systolic at her facility so will increase metoprolol to 50mg  daily to address this  - metoprolol succinate (TOPROL-XL) 50 MG 24 hr tablet; Take 1 tablet (50 mg total) by mouth daily. Take with or immediately following a meal.  Dispense: 30 tablet; Refill: 3 -I a trying to avoid adding a diuretic b/c she's had difficulty with lightheadedness with that b/c she does NOT drink enough water and was getting lightheaded before when she was on one  3. Macular degeneration (senile) of retina -ongoing, continues to follow with retina specialist -vision continues to decline and has f/u with Dr. 01/17/2020 coming up and they're going to ask if she could qualify to use her long-term care insurance due to limitations related to her poor sight  4. syndrome -due to her macular, has not talked about recently  5. Generalized OA -especially bothersome with "weak" knees lately, continue use of walker at all times (has rollator at home) -may use tylenol and topical for pain  Labs/tests ordered: no new today Next appt:  02/04/2020   Jaeliana Lococo L. Ia Leeb, D.O. Geriatrics Maureen Ralphs  [facility] [address], [place] [address] Cell Phone (Mon-Fri 8am-5pm):  [phone] On Call:  [phone] & follow  prompts after 5pm & weekends Office Phone:  [phone] Office Fax:  [phone]

## 2019-12-03 ENCOUNTER — Telehealth: Payer: Self-pay

## 2019-12-03 NOTE — Telephone Encounter (Signed)
Patient called and stated her BP was elevated this a.m. at 186/80, pulse 68 (taken by staff with a wrist cuff).  She states she has not gotten the 50 mg metoprolol yet, but took two 25 mg tablets and had taken the irbesartan. She states they had to been out to breakfast, as is their routine.

## 2019-12-03 NOTE — Telephone Encounter (Signed)
Patient denied high sodium diet, states she doesn't have any meat when they go out for breakfast - eats grits or an egg biscuit. She avoids soups at facility in an attempt to control her sodium. The suggestions made by Dr. Renato Gails given to the patient.

## 2019-12-03 NOTE — Telephone Encounter (Signed)
It's not clear to me when the bp was checked vs when she took those pills and when she at her high sodium [place] meal.  I recommend we continue with the metoprolol 50mg  daily in the morning.  BP should not be checked until an hour after morning meds and preferably not right after she gets back from going out (when she's been exerting herself).

## 2019-12-09 ENCOUNTER — Ambulatory Visit: Payer: Medicare Other | Admitting: Cardiology

## 2019-12-09 ENCOUNTER — Other Ambulatory Visit: Payer: Self-pay

## 2019-12-09 ENCOUNTER — Encounter (INDEPENDENT_AMBULATORY_CARE_PROVIDER_SITE_OTHER): Payer: Medicare Other | Admitting: Ophthalmology

## 2019-12-09 ENCOUNTER — Encounter: Payer: Self-pay | Admitting: Cardiology

## 2019-12-09 VITALS — BP 166/71 | HR 66 | Temp 98.0°F | Resp 18 | Ht 63.0 in | Wt 116.0 lb

## 2019-12-09 DIAGNOSIS — E039 Hypothyroidism, unspecified: Secondary | ICD-10-CM | POA: Diagnosis not present

## 2019-12-09 DIAGNOSIS — I10 Essential (primary) hypertension: Secondary | ICD-10-CM | POA: Diagnosis not present

## 2019-12-09 DIAGNOSIS — I480 Paroxysmal atrial fibrillation: Secondary | ICD-10-CM | POA: Diagnosis not present

## 2019-12-09 DIAGNOSIS — Z7901 Long term (current) use of anticoagulants: Secondary | ICD-10-CM | POA: Diagnosis not present

## 2019-12-09 MED ORDER — METOPROLOL SUCCINATE ER 100 MG PO TB24: 100.0000 mg | ORAL_TABLET | Freq: Every day | ORAL | Status: DC

## 2019-12-09 MED ORDER — APIXABAN 2.5 MG PO TABS: 2.5000 mg | ORAL_TABLET | Freq: Two times a day (BID) | ORAL | 3 refills | Status: DC

## 2019-12-09 NOTE — Progress Notes (Signed)
Date:  12/09/2019   ID:  [name], DOB 07/26/1931, MRN [id number]  PCP:  Chelsea Balo, DO  Cardiologist:  Chelsea Lerner, DO, [name] (established care 12/09/2019)  REASON FOR CONSULT: Atrial fibrillation and hypertension  REQUESTING PHYSICIAN:  Chelsea Balo, DO [address] [place],  [place] [address]  Chief Complaint  Patient presents with  . Atrial Fibrillation  . Hypertension  . New Patient (Initial Visit)    HPI  [name] is a 86 y.o. female who is being seen today for the evaluation of newly diagnosed atrial fibrillation hypertension at the request of [name], Chelsea L, DO. Patient's past medical history and cardiac risk factors include: Hypothyroidism, hypertension, paroxysmal atrial fibrillation, postmenopausal female, and advanced age.  Newly diagnosed atrial fibrillation: Patient states that on Easter Sunday she was having symptoms of palpitation and went to the ER for further evaluation.  She was diagnosed with atrial fibrillation and placed on Cardizem drip.  Patient states that her heart rate responded well to the Cardizem drip and was transitioned to oral medication and started on Eliquis for thromboembolic prophylaxis.  She is referred to the office for further evaluation.  EKG in the office shows normal sinus rhythm.  She denies any chest pain or shortness of breath at rest or with effort related activities.  No prior cardiac testing has been performed that is available for my review.  No prior history of intracranial bleeding or internal bleeding.  Patient does ambulate with a cane.  Hypertension: Patient states that she was diagnosed with hypertension her 40s.  She has been on antihypertensive medications for several days.  However, has noticed her blood pressures are difficult to control.  Medications reviewed.  Her blood pressure log is reviewed.  And patient tries to consume a low-salt diet.  Patient is systolic blood pressure ranges from 140-182 mmHg.   Diuretics have been discontinued secondary to near syncopal events.  She does wear compression stockings.  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  FUNCTIONAL STATUS: Able to do her ADLs and walks on a daily routine.    ALLERGIES: Allergies  Allergen Reactions  . Ambien [Zolpidem Tartrate]     hallucination  . Bactrim [Sulfamethoxazole-Trimethoprim]     Unknown reaction   . Morphine And Related Nausea And Vomiting  . Zithromax [Azithromycin]     Not effective      MEDICATION LIST PRIOR TO VISIT: Current Meds  Medication Sig  . acetaminophen (TYLENOL) 500 MG tablet Take 500 mg by mouth in the morning and at bedtime.   . ALPRAZolam (XANAX) 0.5 MG tablet TAKE 1 TABLET BY MOUTH THREE TIMES A DAY AS NEEDED FOR ANXIETY  . doxazosin (CARDURA) 4 MG tablet TAKE 1 TABLET BY MOUTH EVERY DAY  . irbesartan (AVAPRO) 150 MG tablet Take 1 tablet (150 mg total) by mouth daily.  Marland Kitchen KLOR-CON M20 20 MEQ tablet TAKE 1 TABLET BY MOUTH EVERY DAY  . levothyroxine (SYNTHROID) 100 MCG tablet TAKE 1 TABLET BY MOUTH EVERY DAY BEFORE BREAKFAST  . loratadine (CLARITIN) 10 MG tablet Take 10 mg by mouth daily.   . pantoprazole (PROTONIX) 40 MG tablet TAKE 1 TABLET BY MOUTH EVERY DAY  . polyethylene glycol (MIRALAX / GLYCOLAX) packet Take 17 g by mouth daily.   Marland Kitchen Propylene Glycol (SYSTANE BALANCE) 0.6 % SOLN Place 1 drop into both eyes 2 (two) times daily.  . sertraline (ZOLOFT) 50 MG tablet TAKE  1 TABLET BY MOUTH EVERY DAY TO CALM NERVES.  Marland Kitchen temazepam (RESTORIL) 30 MG capsule TAKE 1 CAPSULE BY MOUTH AT BEDTIME AS NEEDED FOR SLEEP  . [DISCONTINUED] apixaban (ELIQUIS) 2.5 MG TABS tablet Take 1 tablet (2.5 mg total) by mouth 2 (two) times daily.  . [DISCONTINUED] diltiazem (CARDIZEM CD) 120 MG 24 hr capsule Take 1 capsule (120 mg total) by mouth daily.  . [DISCONTINUED] metoprolol succinate (TOPROL-XL) 50 MG 24 hr tablet Take 1 tablet (50 mg total) by mouth daily. Take with  or immediately following a meal.  . apixaban (ELIQUIS) 2.5 MG TABS tablet Take 1 tablet (2.5 mg total) by mouth 2 (two) times daily.     PAST MEDICAL HISTORY: Past Medical History:  Diagnosis Date  . Allergic rhinitis due to pollen   . Anxiety state, unspecified   . Calculus of kidney   . Cataract   . Diaphragmatic hernia without mention of obstruction or gangrene   . Diffuse cystic mastopathy   . Dizziness and giddiness   . GERD (gastroesophageal reflux disease)   . Hiatal hernia   . Insomnia, unspecified   . Insomnia, unspecified   . Lumbago   . Macular degeneration (senile) of retina, unspecified   . Open wound of toe(s), without mention of complication   . Osteoarthrosis, unspecified whether generalized or localized, unspecified site   . Other and unspecified hyperlipidemia   . Personal history of fall   . Sciatica   . Sebaceous cyst   . Senile osteoporosis   . Unspecified constipation   . Unspecified essential hypertension   . Unspecified hereditary and idiopathic peripheral neuropathy   . Unspecified hypothyroidism   . Unspecified tinnitus     PAST SURGICAL HISTORY: Past Surgical History:  Procedure Laterality Date  . ABDOMINAL HYSTERECTOMY    . CATARACT EXTRACTION, BILATERAL    . CHOLECYSTECTOMY  07/25/2007  . TONSILLECTOMY AND ADENOIDECTOMY Bilateral 1943    FAMILY HISTORY: The patient family history includes Fibromyalgia in her daughter; Hyperlipidemia in her brother; Hypertension in her brother, brother, and son.  SOCIAL HISTORY:  The patient  reports that she has never smoked. She has never used smokeless tobacco. She reports that she does not drink alcohol or use drugs.  REVIEW OF SYSTEMS: Review of Systems  Constitution: Negative for chills and fever.  HENT: Negative for hoarse voice and nosebleeds.   Eyes: Negative for discharge, double vision and pain.  Cardiovascular: Negative for chest pain, claudication, dyspnea on exertion, leg swelling,  near-syncope, orthopnea, palpitations, paroxysmal nocturnal dyspnea and syncope.  Respiratory: Negative for hemoptysis and shortness of breath.   Musculoskeletal: Negative for muscle cramps and myalgias.  Gastrointestinal: Negative for abdominal pain, constipation, diarrhea, hematemesis, hematochezia, melena, nausea and vomiting.  Neurological: Negative for dizziness and light-headedness.    PHYSICAL EXAM: Vitals with BMI 12/09/2019 12/02/2019 11/21/2019  Height 5\' 3"  5\' 3"  5\' 3"   Weight 116 lbs 117 lbs 10 oz 119 lbs 3 oz  BMI 20.55 20.84 21.12  Systolic 166 140  Diastolic 71 80 86  Pulse 66 65 69    CONSTITUTIONAL: Well-developed and well-nourished. No acute distress.  SKIN: Skin is warm and dry. No rash noted. No cyanosis. No pallor. No jaundice HEAD: Normocephalic and atraumatic.  EYES: No scleral icterus MOUTH/THROAT: Moist oral membranes.  NECK: No JVD present. No thyromegaly noted.  LYMPHATIC: No visible cervical adenopathy.  CHEST Normal respiratory effort. No intercostal retractions  LUNGS: Clear to auscultation bilaterally.  No stridor. No wheezes. No  rales.  CARDIOVASCULAR: Regular rate and rhythm, positive S1 and S2, soft systolic murmur heard at the left sternal border, no gallops or rubs. ABDOMINAL: No apparent ascites.  EXTREMITIES: No peripheral edema, warm to touch bilaterally HEMATOLOGIC: No significant bruising NEUROLOGIC: Oriented to person, place, and time. Nonfocal. Normal muscle tone.  PSYCHIATRIC: Normal mood and affect. Normal behavior. Cooperative  CARDIAC DATABASE: EKG: 12/09/2019: Normal sinus rhythm, 70 bpm, normal axis, poor R wave progression, LVH per voltage criteria, cannot rule out old anterior infarct, without underlying injury pattern.  Echocardiogram: None  Stress Testing: None  Heart Catheterization: None  LABORATORY DATA: CBC Latest Ref Rng & Units 11/17/2019 12/05/2018 07/23/2018  WBC 4.0 - 10.5 K/uL 4.8 4.1 3.7(L)  Hemoglobin 12.0 -  15.0 g/dL 16.114.1 09.613.3 04.512.9  Hematocrit 36.0 - 46.0 % 42.9 38.9 39.1  Platelets 150 - 400 K/uL 117(L) 141 143    CMP Latest Ref Rng & Units 11/17/2019 08/28/2019 12/05/2018  Glucose 70 - 99 mg/dL 409(W131(H) 97 119(J109(H)  BUN 8 - 23 mg/dL 12 17 20   Creatinine 0.44 - 1.00 mg/dL 4.780.72 2.950.71 6.210.83  Sodium 135 - 145 mmol/L 129(L) 130(L) 132(L)  Potassium 3.5 - 5.1 mmol/L 4.2 3.6 3.3(L)  Chloride 98 - 111 mmol/L 87(L) 87(L) 88(L)  CO2 22 - 32 mmol/L 31 37(H) 36(H)  Calcium 8.9 - 10.3 mg/dL 9.3 9.6 9.8  Total Protein 6.1 - 8.1 g/dL - - 6.5  Total Bilirubin 0.2 - 1.2 mg/dL - - 0.6  Alkaline Phos 38 - 126 U/L - - -  AST 10 - 35 U/L - - 14  ALT 6 - 29 U/L - - 9    Lipid Panel     Component Value Date/Time   CHOL 175 03/05/2018 1017   CHOL 131 07/30/2015 1053   TRIG 147 03/05/2018 1017   HDL 59 03/05/2018 1017   HDL 59 07/30/2015 1053   CHOLHDL 3.0 03/05/2018 1017   VLDL 24 10/05/2016 0957   LDLCALC 92 03/05/2018 1017   LABVLDL 15 07/30/2015 1053    Lab Results  Component Value Date   HGBA1C  07/23/2007    5.7 (NOTE)   The ADA recommends the following therapeutic goals for glycemic   control related to Hgb A1C measurement:   Goal of Therapy:   < 7.0% Hgb A1C   Action Suggested:  > 8.0% Hgb A1C   Ref:  Diabetes Care, 22, Suppl. 1, 1999   No components found for: NTPROBNP Lab Results  Component Value Date   TSH 3.002 11/17/2019   TSH 4.73 (H) 12/05/2018   TSH 6.71 (H) 07/23/2018   IMPRESSION:    ICD-10-CM   1. Paroxysmal atrial fibrillation (HCC)  I48.0 metoprolol succinate (TOPROL XL) 100 MG 24 hr tablet    PCV ECHOCARDIOGRAM COMPLETE    PCV MYOCARDIAL PERFUSION WITH LEXISCAN    apixaban (ELIQUIS) 2.5 MG TABS tablet  2. Long term (current) use of anticoagulants  Z79.01   3. Essential hypertension  I10 EKG 12-Lead  4. Hypothyroidism, adult  E03.9      RECOMMENDATIONS: [name] is a 86 y.o. female whose past medical history and cardiac risk factors include: Hypothyroidism,  hypertension, paroxysmal atrial fibrillation, postmenopausal female, and advanced age.  Paroxysmal atrial fibrillation: Currently normal sinus rhythm.  Newly diagnosed April 2021.  Currently on metoprolol and diltiazem for rate control.  Patient states that she takes diltiazem on as needed basis when her heart rate is more than 60 bpm.  She states that  she used to be on metoprolol 100 mg p.o. daily and tolerated the medication well but for reasons unknown she is currently been transitioned down to 50 mg p.o. daily.  Patient is instructed to hold diltiazem.  Increased Toprol-XL 100 mg p.o. daily as that she was taking in the past.  Currently on Eliquis for thromboembolic prophylaxis.  CHA2DS2-VASc SCORE is 4 which correlates to 4 % risk of stroke per year.  Echocardiogram will be ordered to evaluate for structural heart disease and left ventricular systolic function.  Nuclear stress test recommended to evaluate for reversible ischemia.  Long-term oral anticoagulation:   Indication paroxysmal atrial fibrillation.    Patient does not endorse any evidence of bleeding. No prior history of intracranial bleed or internal bleeding.    Patient educated on the risks, benefits, and alternatives to oral anticoagulation she verbalizes understanding.  Benign essential hypertension:  Medications reviewed.  Blood pressure is currently not at goal.  Patient states that she has to pay somebody at her facility $15 every time she has to check her blood pressures.  Therefore introduced the concept of principle care management. Patient will be enrolled into principal care management for amatory blood pressure monitoring.  For now hold diltiazem as mentioned above.  Increase Toprol-XL from 50 mg to 100 mg p.o. daily.  We will follow-up later this week to reevaluate blood pressures reading and may consider the addition of Norvasc.  Patient has hyponatremia on review of blood work.  Currently  managed by primary team per patient.  FINAL MEDICATION LIST END OF ENCOUNTER: Meds ordered this encounter  Medications  . metoprolol succinate (TOPROL XL) 100 MG 24 hr tablet    Sig: Take 1 tablet (100 mg total) by mouth daily for 90 doses. Hold if systolic blood pressure (top blood pressure number) less than 100 mmHg or heart rate less than 60 bpm (pulse).  Marland Kitchen apixaban (ELIQUIS) 2.5 MG TABS tablet    Sig: Take 1 tablet (2.5 mg total) by mouth 2 (two) times daily.    Dispense:  60 tablet    Refill:  3    Medications Discontinued During This Encounter  Medication Reason  . metoprolol succinate (TOPROL-XL) 50 MG 24 hr tablet Change in therapy  . diltiazem (CARDIZEM CD) 120 MG 24 hr capsule Discontinued by provider  . apixaban (ELIQUIS) 2.5 MG TABS tablet Reorder     Current Outpatient Medications:  .  acetaminophen (TYLENOL) 500 MG tablet, Take 500 mg by mouth in the morning and at bedtime. , Disp: , Rfl:  .  ALPRAZolam (XANAX) 0.5 MG tablet, TAKE 1 TABLET BY MOUTH THREE TIMES A DAY AS NEEDED FOR ANXIETY, Disp: 90 tablet, Rfl: 5 .  doxazosin (CARDURA) 4 MG tablet, TAKE 1 TABLET BY MOUTH EVERY DAY, Disp: 90 tablet, Rfl: 1 .  irbesartan (AVAPRO) 150 MG tablet, Take 1 tablet (150 mg total) by mouth daily., Disp: 90 tablet, Rfl: 3 .  KLOR-CON M20 20 MEQ tablet, TAKE 1 TABLET BY MOUTH EVERY DAY, Disp: 90 tablet, Rfl: 1 .  levothyroxine (SYNTHROID) 100 MCG tablet, TAKE 1 TABLET BY MOUTH EVERY DAY BEFORE BREAKFAST, Disp: 90 tablet, Rfl: 3 .  loratadine (CLARITIN) 10 MG tablet, Take 10 mg by mouth daily. , Disp: , Rfl:  .  pantoprazole (PROTONIX) 40 MG tablet, TAKE 1 TABLET BY MOUTH EVERY DAY, Disp: 90 tablet, Rfl: 1 .  polyethylene glycol (MIRALAX / GLYCOLAX) packet, Take 17 g by mouth daily. , Disp: , Rfl:  .  Propylene Glycol (SYSTANE BALANCE) 0.6 % SOLN, Place 1 drop into both eyes 2 (two) times daily., Disp: , Rfl:  .  sertraline (ZOLOFT) 50 MG tablet, TAKE 1 TABLET BY MOUTH EVERY DAY TO CALM  NERVES., Disp: 90 tablet, Rfl: 1 .  temazepam (RESTORIL) 30 MG capsule, TAKE 1 CAPSULE BY MOUTH AT BEDTIME AS NEEDED FOR SLEEP, Disp: 30 capsule, Rfl: 5 .  apixaban (ELIQUIS) 2.5 MG TABS tablet, Take 1 tablet (2.5 mg total) by mouth 2 (two) times daily., Disp: 60 tablet, Rfl: 3 .  metoprolol succinate (TOPROL XL) 100 MG 24 hr tablet, Take 1 tablet (100 mg total) by mouth daily for 90 doses. Hold if systolic blood pressure (top blood pressure number) less than 100 mmHg or heart rate less than 60 bpm (pulse)., Disp: , Rfl:   Orders Placed This Encounter  Procedures  . PCV MYOCARDIAL PERFUSION WITH LEXISCAN  . EKG 12-Lead  . PCV ECHOCARDIOGRAM COMPLETE   --Continue cardiac medications as reconciled in final medication list. --Return in about 4 weeks (around 01/06/2020) for afib follow up., review test results., BP follow up.. Or sooner if needed. --Continue follow-up with your primary care physician regarding the management of your other chronic comorbid conditions.  Patient's questions and concerns were addressed to her satisfaction. She voices understanding of the instructions provided during this encounter.   This note was created using a voice recognition software as a result there may be grammatical errors inadvertently enclosed that do not reflect the nature of this encounter. Every attempt is made to correct such errors.  [name], [place], [facility]  Pager: [phone] Office: [phone]

## 2019-12-11 ENCOUNTER — Ambulatory Visit (INDEPENDENT_AMBULATORY_CARE_PROVIDER_SITE_OTHER): Payer: Medicare Other | Admitting: Ophthalmology

## 2019-12-11 ENCOUNTER — Other Ambulatory Visit: Payer: Self-pay | Admitting: Internal Medicine

## 2019-12-11 ENCOUNTER — Other Ambulatory Visit: Payer: Self-pay

## 2019-12-11 ENCOUNTER — Encounter (INDEPENDENT_AMBULATORY_CARE_PROVIDER_SITE_OTHER): Payer: Self-pay | Admitting: Ophthalmology

## 2019-12-11 DIAGNOSIS — H353221 Exudative age-related macular degeneration, left eye, with active choroidal neovascularization: Secondary | ICD-10-CM | POA: Insufficient documentation

## 2019-12-11 DIAGNOSIS — H353124 Nonexudative age-related macular degeneration, left eye, advanced atrophic with subfoveal involvement: Secondary | ICD-10-CM | POA: Diagnosis not present

## 2019-12-11 DIAGNOSIS — H353113 Nonexudative age-related macular degeneration, right eye, advanced atrophic without subfoveal involvement: Secondary | ICD-10-CM

## 2019-12-11 DIAGNOSIS — H353212 Exudative age-related macular degeneration, right eye, with inactive choroidal neovascularization: Secondary | ICD-10-CM | POA: Diagnosis not present

## 2019-12-11 DIAGNOSIS — G4700 Insomnia, unspecified: Secondary | ICD-10-CM

## 2019-12-11 DIAGNOSIS — H353211 Exudative age-related macular degeneration, right eye, with active choroidal neovascularization: Secondary | ICD-10-CM | POA: Diagnosis not present

## 2019-12-11 MED ORDER — BEVACIZUMAB CHEMO INJECTION 1.25MG/0.05ML SYRINGE FOR KALEIDOSCOPE
1.2500 mg | INTRAVITREAL | Status: AC | PRN
  Administered 2019-12-11: 1.25 mg via INTRAVITREAL

## 2019-12-11 NOTE — Telephone Encounter (Signed)
Received Escribe from pharmacy. Pended Rx and sent to Dr. Renato Gails for approval.

## 2019-12-11 NOTE — Progress Notes (Signed)
12/11/2019     CHIEF COMPLAINT Patient presents for Retina Follow Up   HISTORY OF PRESENT ILLNESS: [name] is a 86 y.o. female who presents to the clinic today for:   HPI    Retina Follow Up    Patient presents with  Wet AMD.  In left eye.  Duration of 2 months.  Since onset it is gradually worsening.          Comments    2 month follow up - OCT OU, Possible Avastin OS Patient states that she thinks she has had a decrease in vision in her right eye. Patient states it is becoming harder to read her pill bottles.       Last edited by Berenice Bouton on 12/11/2019 10:27 AM. (History)      Referring physician: Kermit Balo, DO [address] [place],  [place] [address]  HISTORICAL INFORMATION:   Selected notes from the MEDICAL RECORD NUMBER    Lab Results  Component Value Date   HGBA1C  07/23/2007    5.7 (NOTE)   The ADA recommends the following therapeutic goals for glycemic   control related to Hgb A1C measurement:   Goal of Therapy:   < 7.0% Hgb A1C   Action Suggested:  > 8.0% Hgb A1C   Ref:  Diabetes Care, 22, Suppl. 1, 1999     CURRENT MEDICATIONS: Current Outpatient Medications (Ophthalmic Drugs)  Medication Sig  . Propylene Glycol (SYSTANE BALANCE) 0.6 % SOLN Place 1 drop into both eyes 2 (two) times daily.   No current facility-administered medications for this visit. (Ophthalmic Drugs)   Current Outpatient Medications (Other)  Medication Sig  . acetaminophen (TYLENOL) 500 MG tablet Take 500 mg by mouth in the morning and at bedtime.   . ALPRAZolam (XANAX) 0.5 MG tablet TAKE 1 TABLET BY MOUTH THREE TIMES A DAY AS NEEDED FOR ANXIETY  . apixaban (ELIQUIS) 2.5 MG TABS tablet Take 1 tablet (2.5 mg total) by mouth 2 (two) times daily.  Marland Kitchen doxazosin (CARDURA) 4 MG tablet TAKE 1 TABLET BY MOUTH EVERY DAY  . irbesartan (AVAPRO) 150 MG tablet Take 1 tablet (150 mg total) by mouth daily.  Marland Kitchen KLOR-CON M20 20 MEQ tablet TAKE 1 TABLET BY MOUTH EVERY DAY  .  levothyroxine (SYNTHROID) 100 MCG tablet TAKE 1 TABLET BY MOUTH EVERY DAY BEFORE BREAKFAST  . loratadine (CLARITIN) 10 MG tablet Take 10 mg by mouth daily.   . metoprolol succinate (TOPROL XL) 100 MG 24 hr tablet Take 1 tablet (100 mg total) by mouth daily for 90 doses. Hold if systolic blood pressure (top blood pressure number) less than 100 mmHg or heart rate less than 60 bpm (pulse).  . pantoprazole (PROTONIX) 40 MG tablet TAKE 1 TABLET BY MOUTH EVERY DAY  . polyethylene glycol (MIRALAX / GLYCOLAX) packet Take 17 g by mouth daily.   . sertraline (ZOLOFT) 50 MG tablet TAKE 1 TABLET BY MOUTH EVERY DAY TO CALM NERVES.  Marland Kitchen temazepam (RESTORIL) 30 MG capsule TAKE 1 CAPSULE BY MOUTH AT BEDTIME AS NEEDED FOR SLEEP   No current facility-administered medications for this visit. (Other)      REVIEW OF SYSTEMS:    ALLERGIES Allergies  Allergen Reactions  . Ambien [Zolpidem Tartrate]     hallucination  . Bactrim [Sulfamethoxazole-Trimethoprim]     Unknown reaction   . Morphine And Related Nausea And Vomiting  . Zithromax [Azithromycin]     Not effective  PAST MEDICAL HISTORY Past Medical History:  Diagnosis Date  . Allergic rhinitis due to pollen   . Anxiety state, unspecified   . Calculus of kidney   . Cataract   . Diaphragmatic hernia without mention of obstruction or gangrene   . Diffuse cystic mastopathy   . Dizziness and giddiness   . GERD (gastroesophageal reflux disease)   . Hiatal hernia   . Insomnia, unspecified   . Insomnia, unspecified   . Lumbago   . Macular degeneration (senile) of retina, unspecified   . Open wound of toe(s), without mention of complication   . Osteoarthrosis, unspecified whether generalized or localized, unspecified site   . Other and unspecified hyperlipidemia   . Personal history of fall   . Sciatica   . Sebaceous cyst   . Senile osteoporosis   . Unspecified constipation   . Unspecified essential hypertension   . Unspecified hereditary  and idiopathic peripheral neuropathy   . Unspecified hypothyroidism   . Unspecified tinnitus    Past Surgical History:  Procedure Laterality Date  . ABDOMINAL HYSTERECTOMY    . CATARACT EXTRACTION, BILATERAL    . CHOLECYSTECTOMY  07/25/2007  . TONSILLECTOMY AND ADENOIDECTOMY Bilateral 1943    FAMILY HISTORY Family History  Problem Relation Age of Onset  . Hypertension Brother   . Fibromyalgia Daughter   . Hypertension Son   . Hypertension Brother   . Hyperlipidemia Brother   . Breast cancer Neg Hx     SOCIAL HISTORY Social History   Tobacco Use  . Smoking status: Never Smoker  . Smokeless tobacco: Never Used  Substance Use Topics  . Alcohol use: No    Alcohol/week: 0.0 standard drinks  . Drug use: No         OPHTHALMIC EXAM: Base Eye Exam    Visual Acuity (Snellen - Linear)      Right Left   Dist cc 20/60-2 20/400   Dist ph cc NI NI   Correction: Glasses       Tonometry (Tonopen, 10:36 AM)      Right Left   Pressure 8 11       Pupils      Pupils Dark Light Shape React APD   Right PERRL 3 2 Round Brisk None   Left PERRL 3 2 Round Brisk None       Visual Fields (Counting fingers)      Left Right    Full Full       Extraocular Movement      Right Left    Full Full       Neuro/Psych    Oriented x3: Yes   Mood/Affect: Normal       Dilation    Both eyes: 1.0% Mydriacyl, 2.5% Phenylephrine @ 10:36 AM        Slit Lamp and Fundus Exam    External Exam      Right Left   External Normal Normal       Slit Lamp Exam      Right Left   Lids/Lashes Normal Normal   Conjunctiva/Sclera White and quiet White and quiet   Cornea Clear Clear   Anterior Chamber Deep and quiet Deep and quiet   Iris Round and reactive Round and reactive   Lens Posterior chamber intraocular lens Posterior chamber intraocular lens   Vitreous Normal Normal          IMAGING AND PROCEDURES  Imaging and Procedures for 12/11/19  ASSESSMENT/PLAN:  No  problem-specific Assessment & Plan notes found for this encounter.      ICD-10-CM   1. Exudative age-related macular degeneration of left eye with active choroidal neovascularization ([place])  H35.3221   2. Advanced nonexudative age-related macular degeneration of left eye with subfoveal involvement  H35.3124   3. Exudative age-related macular degeneration of right eye with inactive choroidal neovascularization ([place])  H35.3212   4. Advanced nonexudative age-related macular degeneration of right eye without subfoveal involvement  H35.3113     1.  2.  3.  Ophthalmic Meds Ordered this visit:  No orders of the defined types were placed in this encounter.      No follow-ups on file.  There are no Patient Instructions on file for this visit.   Explained the diagnoses, plan, and follow up with the patient and they expressed understanding.  Patient expressed understanding of the importance of proper follow up care.   Clent Demark Deshawnda Acrey M.D. Diseases & Surgery of the Retina and Vitreous [facility] [place] 12/11/19     Abbreviations: M myopia (nearsighted); A astigmatism; H hyperopia (farsighted); P presbyopia; Mrx spectacle prescription;  CTL contact lenses; OD right eye; OS left eye; OU both eyes  XT exotropia; ET esotropia; PEK punctate epithelial keratitis; PEE punctate epithelial erosions; DES dry eye syndrome; MGD meibomian gland dysfunction; ATs artificial tears; PFAT's preservative free artificial tears; [place] nuclear sclerotic cataract; PSC posterior subcapsular cataract; ERM epi-retinal membrane; PVD posterior vitreous detachment; RD retinal detachment; DM diabetes mellitus; DR diabetic retinopathy; NPDR non-proliferative diabetic retinopathy; PDR proliferative diabetic retinopathy; CSME clinically significant macular edema; DME diabetic macular edema; dbh dot blot hemorrhages; CWS cotton wool spot; POAG primary open angle glaucoma; C/D cup-to-disc ratio; HVF humphrey visual  field; GVF goldmann visual field; OCT optical coherence tomography; IOP intraocular pressure; BRVO Branch retinal vein occlusion; CRVO central retinal vein occlusion; CRAO central retinal artery occlusion; BRAO branch retinal artery occlusion; RT retinal tear; SB scleral buckle; PPV pars plana vitrectomy; VH Vitreous hemorrhage; PRP panretinal laser photocoagulation; IVK intravitreal kenalog; VMT vitreomacular traction; MH Macular hole;  NVD neovascularization of the disc; NVE neovascularization elsewhere; AREDS age related eye disease study; ARMD age related macular degeneration; POAG primary open angle glaucoma; EBMD epithelial/anterior basement membrane dystrophy; ACIOL anterior chamber intraocular lens; IOL intraocular lens; PCIOL posterior chamber intraocular lens; Phaco/IOL phacoemulsification with intraocular lens placement; [place] photorefractive keratectomy; LASIK laser assisted in situ keratomileusis; HTN hypertension; DM diabetes mellitus; COPD chronic obstructive pulmonary disease

## 2019-12-12 ENCOUNTER — Ambulatory Visit (INDEPENDENT_AMBULATORY_CARE_PROVIDER_SITE_OTHER): Payer: Medicare Other | Admitting: Ophthalmology

## 2019-12-12 ENCOUNTER — Encounter (INDEPENDENT_AMBULATORY_CARE_PROVIDER_SITE_OTHER): Payer: Self-pay | Admitting: Ophthalmology

## 2019-12-12 DIAGNOSIS — H353113 Nonexudative age-related macular degeneration, right eye, advanced atrophic without subfoveal involvement: Secondary | ICD-10-CM

## 2019-12-12 DIAGNOSIS — H353211 Exudative age-related macular degeneration, right eye, with active choroidal neovascularization: Secondary | ICD-10-CM | POA: Diagnosis not present

## 2019-12-12 DIAGNOSIS — H353124 Nonexudative age-related macular degeneration, left eye, advanced atrophic with subfoveal involvement: Secondary | ICD-10-CM

## 2019-12-12 DIAGNOSIS — H353221 Exudative age-related macular degeneration, left eye, with active choroidal neovascularization: Secondary | ICD-10-CM

## 2019-12-12 MED ORDER — BEVACIZUMAB CHEMO INJECTION 1.25MG/0.05ML SYRINGE FOR KALEIDOSCOPE
1.2500 mg | INTRAVITREAL | Status: AC | PRN
  Administered 2019-12-12: 1.25 mg via INTRAVITREAL

## 2019-12-12 NOTE — Assessment & Plan Note (Signed)
Restart Avastin intravitreal OD today

## 2019-12-12 NOTE — Progress Notes (Signed)
12/12/2019     CHIEF COMPLAINT Patient presents for Retina Follow Up   HISTORY OF PRESENT ILLNESS: [name] is a 86 y.o. female who presents to the clinic today for:   HPI    Retina Follow Up    Patient presents with  Wet AMD.  In right eye.  Duration of 1 day.  Since onset it is stable.          Comments    1 day follow up - OCT OU, Possible Avastin OD Patient denies change in vision and overall has no complaints.        Last edited by Berenice Bouton on 12/12/2019 10:00 AM. (History)      Referring physician: Kermit Balo, DO [address] [place],  [place] [address]  HISTORICAL INFORMATION:   Selected notes from the MEDICAL RECORD NUMBER    Lab Results  Component Value Date   HGBA1C  07/23/2007    5.7 (NOTE)   The ADA recommends the following therapeutic goals for glycemic   control related to Hgb A1C measurement:   Goal of Therapy:   < 7.0% Hgb A1C   Action Suggested:  > 8.0% Hgb A1C   Ref:  Diabetes Care, 22, Suppl. 1, 1999     CURRENT MEDICATIONS: Current Outpatient Medications (Ophthalmic Drugs)  Medication Sig  . Propylene Glycol (SYSTANE BALANCE) 0.6 % SOLN Place 1 drop into both eyes 2 (two) times daily.   No current facility-administered medications for this visit. (Ophthalmic Drugs)   Current Outpatient Medications (Other)  Medication Sig  . acetaminophen (TYLENOL) 500 MG tablet Take 500 mg by mouth in the morning and at bedtime.   . ALPRAZolam (XANAX) 0.5 MG tablet TAKE 1 TABLET BY MOUTH THREE TIMES A DAY AS NEEDED FOR ANXIETY  . apixaban (ELIQUIS) 2.5 MG TABS tablet Take 1 tablet (2.5 mg total) by mouth 2 (two) times daily.  Marland Kitchen doxazosin (CARDURA) 4 MG tablet TAKE 1 TABLET BY MOUTH EVERY DAY  . irbesartan (AVAPRO) 150 MG tablet Take 1 tablet (150 mg total) by mouth daily.  Marland Kitchen KLOR-CON M20 20 MEQ tablet TAKE 1 TABLET BY MOUTH EVERY DAY  . levothyroxine (SYNTHROID) 100 MCG tablet TAKE 1 TABLET BY MOUTH EVERY DAY BEFORE BREAKFAST  . loratadine  (CLARITIN) 10 MG tablet Take 10 mg by mouth daily.   . metoprolol succinate (TOPROL XL) 100 MG 24 hr tablet Take 1 tablet (100 mg total) by mouth daily for 90 doses. Hold if systolic blood pressure (top blood pressure number) less than 100 mmHg or heart rate less than 60 bpm (pulse).  . pantoprazole (PROTONIX) 40 MG tablet TAKE 1 TABLET BY MOUTH EVERY DAY  . polyethylene glycol (MIRALAX / GLYCOLAX) packet Take 17 g by mouth daily.   . sertraline (ZOLOFT) 50 MG tablet TAKE 1 TABLET BY MOUTH EVERY DAY TO CALM NERVES.  Marland Kitchen temazepam (RESTORIL) 30 MG capsule TAKE 1 CAPSULE BY MOUTH AT BEDTIME AS NEEDED FOR SLEEP   No current facility-administered medications for this visit. (Other)      REVIEW OF SYSTEMS:    ALLERGIES Allergies  Allergen Reactions  . Ambien [Zolpidem Tartrate]     hallucination  . Bactrim [Sulfamethoxazole-Trimethoprim]     Unknown reaction   . Morphine And Related Nausea And Vomiting  . Zithromax [Azithromycin]     Not effective      PAST MEDICAL HISTORY Past Medical History:  Diagnosis Date  . Allergic rhinitis due to pollen   .  Anxiety state, unspecified   . Calculus of kidney   . Cataract   . Diaphragmatic hernia without mention of obstruction or gangrene   . Diffuse cystic mastopathy   . Dizziness and giddiness   . GERD (gastroesophageal reflux disease)   . Hiatal hernia   . Insomnia, unspecified   . Insomnia, unspecified   . Lumbago   . Macular degeneration (senile) of retina, unspecified   . Open wound of toe(s), without mention of complication   . Osteoarthrosis, unspecified whether generalized or localized, unspecified site   . Other and unspecified hyperlipidemia   . Personal history of fall   . Sciatica   . Sebaceous cyst   . Senile osteoporosis   . Unspecified constipation   . Unspecified essential hypertension   . Unspecified hereditary and idiopathic peripheral neuropathy   . Unspecified hypothyroidism   . Unspecified tinnitus    Past  Surgical History:  Procedure Laterality Date  . ABDOMINAL HYSTERECTOMY    . CATARACT EXTRACTION, BILATERAL    . CHOLECYSTECTOMY  07/25/2007  . TONSILLECTOMY AND ADENOIDECTOMY Bilateral 1943    FAMILY HISTORY Family History  Problem Relation Age of Onset  . Hypertension Brother   . Fibromyalgia Daughter   . Hypertension Son   . Hypertension Brother   . Hyperlipidemia Brother   . Breast cancer Neg Hx     SOCIAL HISTORY Social History   Tobacco Use  . Smoking status: Never Smoker  . Smokeless tobacco: Never Used  Substance Use Topics  . Alcohol use: No    Alcohol/week: 0.0 standard drinks  . Drug use: No         OPHTHALMIC EXAM:  Base Eye Exam    Visual Acuity (Snellen - Linear)      Right Left   Dist cc 20/80-2 20/400   Dist ph cc NI NI   Correction: Glasses       Tonometry (Tonopen, 10:06 AM)      Right Left   Pressure 12 7        Slit Lamp and Fundus Exam    External Exam      Right Left   External Normal Normal       Slit Lamp Exam      Right Left   Lids/Lashes Normal Normal   Conjunctiva/Sclera White and quiet White and quiet   Cornea Clear Clear   Anterior Chamber Deep and quiet Deep and quiet   Iris Round and reactive Round and reactive   Lens Posterior chamber intraocular lens Posterior chamber intraocular lens   Vitreous Normal Normal          IMAGING AND PROCEDURES  Imaging and Procedures for 12/12/19  Intravitreal Injection, Pharmacologic Agent - OD - Right Eye       Time Out 12/12/2019. 10:32 AM. Confirmed correct patient, procedure, site, and patient consented.   Anesthesia Topical anesthesia was used. Anesthetic medications included Akten 3.5%.   Procedure Preparation included Ofloxacin , 5% betadine to ocular surface, 10% betadine to eyelids. A 30 gauge needle was used.   Injection:  1.25 mg Bevacizumab (AVASTIN) SOLN   NDC: [address]-1, Lot: [id number]   Route: Intravitreal, Site: Right Eye, Waste: 0 mg  Post-op Post  injection exam found visual acuity of at least counting fingers. The patient tolerated the procedure well. There were no complications. The patient received written and verbal post procedure care education. Post injection medications were not given.  ASSESSMENT/PLAN:  Exudative age-related macular degeneration of right eye with active choroidal neovascularization (HCC) Restart Avastin intravitreal OD today      ICD-10-CM   1. Exudative age-related macular degeneration of right eye with active choroidal neovascularization (HCC)  H35.3211 Intravitreal Injection, Pharmacologic Agent - OD - Right Eye    Bevacizumab (AVASTIN) SOLN 1.25 mg  2. Exudative age-related macular degeneration of left eye with active choroidal neovascularization (HCC)  H35.3221   3. Advanced nonexudative age-related macular degeneration of right eye without subfoveal involvement  H35.3113   4. Advanced nonexudative age-related macular degeneration of left eye with subfoveal involvement  H35.3124     1.  See the plan above for the right eye, intravitreal Avastin today 2.  Follow-up wet ARMD OS as scheduled previously  3.  Ophthalmic Meds Ordered this visit:  Meds ordered this encounter  Medications  . Bevacizumab (AVASTIN) SOLN 1.25 mg       Return in about 4 weeks (around 01/09/2020) for AVASTIN OCT, OD.  There are no Patient Instructions on file for this visit.   Explained the diagnoses, plan, and follow up with the patient and they expressed understanding.  Patient expressed understanding of the importance of proper follow up care.   Alford Highland Akera Snowberger M.D. Diseases & Surgery of the Retina and Vitreous [facility] 12/12/19     Abbreviations: M myopia (nearsighted); A astigmatism; H hyperopia (farsighted); P presbyopia; Mrx spectacle prescription;  CTL contact lenses; OD right eye; OS left eye; OU both eyes  XT exotropia; ET esotropia; PEK punctate epithelial  keratitis; PEE punctate epithelial erosions; DES dry eye syndrome; MGD meibomian gland dysfunction; ATs artificial tears; PFAT's preservative free artificial tears; NSC nuclear sclerotic cataract; PSC posterior subcapsular cataract; ERM epi-retinal membrane; PVD posterior vitreous detachment; RD retinal detachment; DM diabetes mellitus; DR diabetic retinopathy; NPDR non-proliferative diabetic retinopathy; PDR proliferative diabetic retinopathy; CSME clinically significant macular edema; DME diabetic macular edema; dbh dot blot hemorrhages; CWS cotton wool spot; POAG primary open angle glaucoma; C/D cup-to-disc ratio; HVF humphrey visual field; GVF goldmann visual field; OCT optical coherence tomography; IOP intraocular pressure; BRVO Branch retinal vein occlusion; CRVO central retinal vein occlusion; CRAO central retinal artery occlusion; BRAO branch retinal artery occlusion; RT retinal tear; SB scleral buckle; PPV pars plana vitrectomy; VH Vitreous hemorrhage; PRP panretinal laser photocoagulation; IVK intravitreal kenalog; VMT vitreomacular traction; MH Macular hole;  NVD neovascularization of the disc; NVE neovascularization elsewhere; AREDS age related eye disease study; ARMD age related macular degeneration; POAG primary open angle glaucoma; EBMD epithelial/anterior basement membrane dystrophy; ACIOL anterior chamber intraocular lens; IOL intraocular lens; PCIOL posterior chamber intraocular lens; Phaco/IOL phacoemulsification with intraocular lens placement; PRK photorefractive keratectomy; LASIK laser assisted in situ keratomileusis; HTN hypertension; DM diabetes mellitus; COPD chronic obstructive pulmonary disease

## 2019-12-13 ENCOUNTER — Other Ambulatory Visit: Payer: Self-pay | Admitting: Pharmacist

## 2019-12-13 DIAGNOSIS — Z7901 Long term (current) use of anticoagulants: Secondary | ICD-10-CM

## 2019-12-13 DIAGNOSIS — I10 Essential (primary) hypertension: Secondary | ICD-10-CM

## 2019-12-13 MED ORDER — AMLODIPINE BESYLATE 5 MG PO TABS: 5.0000 mg | ORAL_TABLET | Freq: Every day | ORAL | 2 refills | Status: DC

## 2019-12-13 NOTE — Progress Notes (Signed)
BP readings reviewed. BP elevated and not at goal. BP readings of 182/110 this morning and remains elevated in 180s in the afternoon. Pt denies any complain of CP, HA, dizziness, SOB, dyspnea, dysarthria. Pt taking her medications as prescribed and denies any missed doses.   Of note, pt had a dose of intravitreal Avastin yesterday from her ophthalmologist Exudative age-related macular degeneration. Per package insert, Avastin may cause and/or worsen hypertension in 24-42% of patient. Severe hypertension occurred at a higher incidence in patients receiving Avastin products. Pt reports that she usually gets Avastin monthly.   Discussed w/ Dr. Odis Hollingshead. Pt currently on Metoprolol 100 mg and irbesartan 150 mg in the morning and doxazosin 4 mg in the afternoon. Will add amlodipine to bedtime and switch irbesartan to evening as well. Rx pended for Dr. Emelda Brothers approval. Will try to titrate Doxazosin off over next week due to increased risk of hypotension and falls in elderly patients per BEER criteria recommendations. Reviewed changes with pt. Pt verbalized understanding. Pt aware to continue monitoring her BP and to notify the office or EMS if her symptoms worsen.

## 2019-12-16 DIAGNOSIS — Z20828 Contact with and (suspected) exposure to other viral communicable diseases: Secondary | ICD-10-CM | POA: Diagnosis not present

## 2019-12-16 DIAGNOSIS — Z1159 Encounter for screening for other viral diseases: Secondary | ICD-10-CM | POA: Diagnosis not present

## 2019-12-18 ENCOUNTER — Encounter (INDEPENDENT_AMBULATORY_CARE_PROVIDER_SITE_OTHER): Payer: Medicare Other | Admitting: Ophthalmology

## 2019-12-19 ENCOUNTER — Ambulatory Visit (HOSPITAL_COMMUNITY): Payer: Medicare Other | Admitting: Nurse Practitioner

## 2019-12-20 ENCOUNTER — Other Ambulatory Visit: Payer: Self-pay

## 2019-12-20 ENCOUNTER — Ambulatory Visit: Payer: Medicare Other

## 2019-12-20 DIAGNOSIS — I480 Paroxysmal atrial fibrillation: Secondary | ICD-10-CM

## 2019-12-23 DIAGNOSIS — Z20828 Contact with and (suspected) exposure to other viral communicable diseases: Secondary | ICD-10-CM | POA: Diagnosis not present

## 2019-12-23 DIAGNOSIS — Z1159 Encounter for screening for other viral diseases: Secondary | ICD-10-CM | POA: Diagnosis not present

## 2019-12-30 ENCOUNTER — Other Ambulatory Visit: Payer: Self-pay

## 2019-12-30 ENCOUNTER — Ambulatory Visit: Payer: Medicare Other

## 2019-12-30 DIAGNOSIS — I480 Paroxysmal atrial fibrillation: Secondary | ICD-10-CM

## 2019-12-30 DIAGNOSIS — Z20828 Contact with and (suspected) exposure to other viral communicable diseases: Secondary | ICD-10-CM | POA: Diagnosis not present

## 2019-12-30 DIAGNOSIS — Z1159 Encounter for screening for other viral diseases: Secondary | ICD-10-CM | POA: Diagnosis not present

## 2019-12-31 NOTE — Progress Notes (Signed)
Pt called back and gave her the echo results, pt understood

## 2019-12-31 NOTE — Progress Notes (Signed)
Called pt no answer, left a vm

## 2019-12-31 NOTE — Progress Notes (Signed)
Left vm to cb.

## 2020-01-02 ENCOUNTER — Telehealth: Payer: Self-pay

## 2020-01-02 NOTE — Telephone Encounter (Signed)
-----   Message from [place], [place] sent at 12/31/2019  9:39 AM EDT ----- The nuclear stress test that was recently performed was reported as normal perfusion and intermediate risk study.  Please have her keep her upcoming office visit to review the results in more details.  If your symptoms have increased in intensity, frequency, duration or new symptoms suggestive of typical chest pain as discussed during last office visit please still seek medical attention at the closest ER via EMS.

## 2020-01-02 NOTE — Telephone Encounter (Signed)
Relayed information to patient. Patient voiced understanding.  

## 2020-01-02 NOTE — Progress Notes (Signed)
Relayed information to patient, patient voiced understanding.  

## 2020-01-06 DIAGNOSIS — Z20828 Contact with and (suspected) exposure to other viral communicable diseases: Secondary | ICD-10-CM | POA: Diagnosis not present

## 2020-01-06 DIAGNOSIS — Z1159 Encounter for screening for other viral diseases: Secondary | ICD-10-CM | POA: Diagnosis not present

## 2020-01-07 ENCOUNTER — Ambulatory Visit: Payer: Medicare Other | Admitting: Cardiology

## 2020-01-07 ENCOUNTER — Encounter: Payer: Self-pay | Admitting: Cardiology

## 2020-01-07 ENCOUNTER — Other Ambulatory Visit: Payer: Self-pay

## 2020-01-07 VITALS — BP 161/75 | HR 57 | Ht 63.0 in | Wt 116.0 lb

## 2020-01-07 DIAGNOSIS — I480 Paroxysmal atrial fibrillation: Secondary | ICD-10-CM

## 2020-01-07 DIAGNOSIS — I10 Essential (primary) hypertension: Secondary | ICD-10-CM | POA: Diagnosis not present

## 2020-01-07 DIAGNOSIS — Z7901 Long term (current) use of anticoagulants: Secondary | ICD-10-CM | POA: Diagnosis not present

## 2020-01-07 MED ORDER — HYDROCHLOROTHIAZIDE 12.5 MG PO CAPS: 12.5000 mg | ORAL_CAPSULE | Freq: Every morning | ORAL | 0 refills | Status: DC

## 2020-01-07 MED ORDER — DOXAZOSIN MESYLATE 4 MG PO TABS: 2.0000 mg | ORAL_TABLET | Freq: Every day | ORAL | 1 refills | Status: DC

## 2020-01-07 MED ORDER — AMLODIPINE BESYLATE 10 MG PO TABS: 10.0000 mg | ORAL_TABLET | Freq: Every evening | ORAL | 0 refills | Status: DC

## 2020-01-07 NOTE — Patient Instructions (Signed)
Please remember to bring in your medication bottles in at the next visit.   New Medications that were added at today's visit:  Hydrochlorothiazide 12.5 mg p.o. every morning  Medications that were changed at today's office visit: Patient is asked to wean off of doxazosin. Week 1: Doxazosin 2 mg p.o. daily Week 2: Doxazosin 2 mg p.o. every other day. Week 3: Stop  Increase amlodipine to 10 mg p.o. every afternoon.  Please get labs done in about 1 week after starting hydrochlorothiazide at the nearest [facility].  Recommend follow up with your PCP as scheduled.

## 2020-01-07 NOTE — Progress Notes (Signed)
Date:  01/07/2020   ID:  [name], DOB [birth year], MRN [id number]  PCP:  Kermit Baloeed, Tiffany L, DO  Cardiologist:  Tessa LernerSunit Warrene Kapfer, DO, [facility] (established care 12/09/2019)  REASON FOR CONSULT: Atrial fibrillation and hypertension  REQUESTING PHYSICIAN:  Kermit Baloeed, Tiffany L, DO [address] [place] [id number]  Chief Complaint  Patient presents with  . Hypertension    pt wants to know if you are going to manage her BP?  Marland Kitchen. Atrial Fibrillation    HPI  Chelsea Marchorma P Padron is a 86 y.o. female whose past medical history and cardiac risk factors include: Hypothyroidism, hypertension, paroxysmal atrial fibrillation, postmenopausal female, and advanced age.  Patient presents to the office with a chief complaint of follow-up on blood pressures and review test results.  She was originally seen in April 2021 at the request of her primary care provider for evaluation of newly diagnosed atrial fibrillation and hypertension management.  Atrial fibrillation: Patient states that she has tolerated the initiation of metoprolol well without any side effects or intolerances.  Her ventricular rate is well controlled.  She is also started on Eliquis for thromboembolic prophylaxis and does not endorse any evidence of bleeding.  Reiterated the risks, benefits, and alternatives to oral anticoagulation at today's office visit.  No recent falls or imbalance with gait.  She does use a walker for ambulation.  She had underwent an echocardiogram and nuclear stress test given the new diagnosis of atrial fibrillation.  The results of the echocardiogram reviewed and noted below for further reference.    Independently reviewed the nuclear stress test images with the patient.  She was informed at today's visit of a small size mild intensity partially reversible perfusion defect in the basal inferoseptal segment most likely secondary to RV insertion.  Mid to distal inferoseptal segments are well perfused.  Otherwise other segments  note normal perfusion.  I believe that this perfusion defect is most likely secondary to underlying artifact.  Shared decision was to proceed with medical management at the current time as the patient is asymptomatic.  She does understand that if she has chest pain or shortness of breath with rest or with effort related activities which is either new or worsening she will seek medical attention at the closest ER via EMS.    Hypertension: Patient states that she was diagnosed with hypertension her 40s.  When she initially presented to the office her systolic blood pressures would range between 140-182 mmHg.  Her medications were titrated at the last office visit and she has been monitored remotely with amatory blood pressure monitoring.  Her blood pressures are still elevated but improving.  Her systolic blood pressures now range between 140--160 mmHg.  Most recent blood pressure log reviewed.    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  FUNCTIONAL STATUS: Able to do her ADLs and walks on a daily routine.    ALLERGIES: Allergies  Allergen Reactions  . Ambien [Zolpidem Tartrate]     hallucination  . Bactrim [Sulfamethoxazole-Trimethoprim]     Unknown reaction   . Morphine And Related Nausea And Vomiting  . Zithromax [Azithromycin]     Not effective      MEDICATION LIST PRIOR TO VISIT: Current Meds  Medication Sig  . acetaminophen (TYLENOL) 500 MG tablet Take 500 mg by mouth in the morning and at bedtime.   . ALPRAZolam (XANAX) 0.5 MG tablet TAKE 1 TABLET BY MOUTH THREE TIMES  A DAY AS NEEDED FOR ANXIETY  . apixaban (ELIQUIS) 2.5 MG TABS tablet Take 1 tablet (2.5 mg total) by mouth 2 (two) times daily.  Marland Kitchen doxazosin (CARDURA) 4 MG tablet Take 0.5 tablets (2 mg total) by mouth daily.  . irbesartan (AVAPRO) 150 MG tablet Take 1 tablet (150 mg total) by mouth daily.  Marland Kitchen KLOR-CON M20 20 MEQ tablet TAKE 1 TABLET BY MOUTH EVERY DAY  . levothyroxine  (SYNTHROID) 100 MCG tablet TAKE 1 TABLET BY MOUTH EVERY DAY BEFORE BREAKFAST  . loratadine (CLARITIN) 10 MG tablet Take 10 mg by mouth daily.   . metoprolol succinate (TOPROL XL) 100 MG 24 hr tablet Take 1 tablet (100 mg total) by mouth daily for 90 doses. Hold if systolic blood pressure (top blood pressure number) less than 100 mmHg or heart rate less than 60 bpm (pulse).  . pantoprazole (PROTONIX) 40 MG tablet TAKE 1 TABLET BY MOUTH EVERY DAY  . polyethylene glycol (MIRALAX / GLYCOLAX) packet Take 17 g by mouth daily.   Marland Kitchen Propylene Glycol (SYSTANE BALANCE) 0.6 % SOLN Place 1 drop into both eyes 2 (two) times daily.  . sertraline (ZOLOFT) 50 MG tablet TAKE 1 TABLET BY MOUTH EVERY DAY TO CALM NERVES.  Marland Kitchen temazepam (RESTORIL) 30 MG capsule TAKE 1 CAPSULE BY MOUTH AT BEDTIME AS NEEDED FOR SLEEP  . [DISCONTINUED] amLODipine (NORVASC) 5 MG tablet Take 1 tablet (5 mg total) by mouth at bedtime.  . [DISCONTINUED] doxazosin (CARDURA) 4 MG tablet TAKE 1 TABLET BY MOUTH EVERY DAY     PAST MEDICAL HISTORY: Past Medical History:  Diagnosis Date  . Allergic rhinitis due to pollen   . Anxiety state, unspecified   . Calculus of kidney   . Cataract   . Diaphragmatic hernia without mention of obstruction or gangrene   . Diffuse cystic mastopathy   . Dizziness and giddiness   . GERD (gastroesophageal reflux disease)   . Hiatal hernia   . Insomnia, unspecified   . Insomnia, unspecified   . Lumbago   . Macular degeneration (senile) of retina, unspecified   . Open wound of toe(s), without mention of complication   . Osteoarthrosis, unspecified whether generalized or localized, unspecified site   . Other and unspecified hyperlipidemia   . Personal history of fall   . Sciatica   . Sebaceous cyst   . Senile osteoporosis   . Unspecified constipation   . Unspecified essential hypertension   . Unspecified hereditary and idiopathic peripheral neuropathy   . Unspecified hypothyroidism   . Unspecified  tinnitus     PAST SURGICAL HISTORY: Past Surgical History:  Procedure Laterality Date  . ABDOMINAL HYSTERECTOMY    . CATARACT EXTRACTION, BILATERAL    . CHOLECYSTECTOMY  07/25/2007  . TONSILLECTOMY AND ADENOIDECTOMY Bilateral 1943    FAMILY HISTORY: The patient family history includes Fibromyalgia in her daughter; Hyperlipidemia in her brother; Hypertension in her brother, brother, and son.  SOCIAL HISTORY:  The patient  reports that she has never smoked. She has never used smokeless tobacco. She reports that she does not drink alcohol or use drugs.  REVIEW OF SYSTEMS: Review of Systems  Constitution: Negative for chills and fever.  HENT: Negative for hoarse voice and nosebleeds.   Eyes: Negative for discharge, double vision and pain.  Cardiovascular: Negative for chest pain, claudication, dyspnea on exertion, leg swelling, near-syncope, orthopnea, palpitations, paroxysmal nocturnal dyspnea and syncope.  Respiratory: Negative for hemoptysis and shortness of breath.   Musculoskeletal: Negative for muscle cramps  and myalgias.  Gastrointestinal: Negative for abdominal pain, constipation, diarrhea, hematemesis, hematochezia, melena, nausea and vomiting.  Neurological: Negative for dizziness and light-headedness.    PHYSICAL EXAM: Vitals with BMI 01/07/2020 12/09/2019 12/02/2019  Height 5\' 3"  5\' 3"  5\' 3"   Weight 116 lbs 116 lbs 117 lbs 10 oz  BMI 20.55 75.17 00.17  Systolic 494 496 759  Diastolic 75 71 80  Pulse 57 66 65    CONSTITUTIONAL: Well-developed and well-nourished. No acute distress.  SKIN: Skin is warm and dry. No rash noted. No cyanosis. No pallor. No jaundice HEAD: Normocephalic and atraumatic.  EYES: No scleral icterus MOUTH/THROAT: Moist oral membranes.  NECK: No JVD present. No thyromegaly noted.  LYMPHATIC: No visible cervical adenopathy.  CHEST Normal respiratory effort. No intercostal retractions  LUNGS: Clear to auscultation bilaterally.  No stridor. No  wheezes. No rales.  CARDIOVASCULAR: Regular rate and rhythm, positive S1 and S2, soft systolic murmur heard at the left sternal border, no gallops or rubs. ABDOMINAL: No apparent ascites.  EXTREMITIES: No peripheral edema, warm to touch bilaterally HEMATOLOGIC: No significant bruising NEUROLOGIC: Oriented to person, place, and time. Nonfocal. Normal muscle tone.  PSYCHIATRIC: Normal mood and affect. Normal behavior. Cooperative  CARDIAC DATABASE: EKG: 12/09/2019: Normal sinus rhythm, 70 bpm, normal axis, poor R wave progression, LVH per voltage criteria, cannot rule out old anterior infarct, without underlying injury pattern.  Echocardiogram: 12/20/2019:  Normal LV systolic function with visual EF 55-60%. Left ventricle cavity is normal in size. Mild concentric hypertrophy of the left ventricle.  Normal global wall motion. Doppler evidence of grade II (pseudonormal) diastolic dysfunction, elevated LAP. Calculated EF 50%.  Patient was in sinus rhythm during exam.  Left atrial cavity is severely dilated with LA voume index of 70 mL/m2.  Structurally normal mitral valve. Mild (Grade I) mitral regurgitation.  Structurally normal tricuspid valve. Mild tricuspid regurgitation. No evidence of pulmonary hypertension. RVSP measures 30 mmHg.  Stress Testing: 12/30/2019: Small sized, mild intensity, partially reversible perfusion defect in basal inferoseptal myocardium. Normal myocardial perfusion. All segments of left ventricle demonstrated normal wall motion and thickening. Stress LVEF calculated 42%, although visually appears normal.  Heart Catheterization: None  LABORATORY DATA: CBC Latest Ref Rng & Units 11/17/2019 12/05/2018 07/23/2018  WBC 4.0 - 10.5 K/uL 4.8 4.1 3.7(L)  Hemoglobin 12.0 - 15.0 g/dL 14.1 13.3 12.9  Hematocrit 36.0 - 46.0 % 42.9 38.9 39.1  Platelets 150 - 400 K/uL 117(L) 141 143    CMP Latest Ref Rng & Units 11/17/2019 08/28/2019 12/05/2018  Glucose 70 - 99 mg/dL 131(H) 97  109(H)  BUN 8 - 23 mg/dL 12 17 20   Creatinine 0.44 - 1.00 mg/dL 0.72 0.71 0.83  Sodium 135 - 145 mmol/L 129(L) 130(L) 132(L)  Potassium 3.5 - 5.1 mmol/L 4.2 3.6 3.3(L)  Chloride 98 - 111 mmol/L 87(L) 87(L) 88(L)  CO2 22 - 32 mmol/L 31 37(H) 36(H)  Calcium 8.9 - 10.3 mg/dL 9.3 9.6 9.8  Total Protein 6.1 - 8.1 g/dL - - 6.5  Total Bilirubin 0.2 - 1.2 mg/dL - - 0.6  Alkaline Phos 38 - 126 U/L - - -  AST 10 - 35 U/L - - 14  ALT 6 - 29 U/L - - 9    Lipid Panel     Component Value Date/Time   CHOL 175 03/05/2018 1017   CHOL 131 07/30/2015 1053   TRIG 147 03/05/2018 1017   HDL 59 03/05/2018 1017   HDL 59 07/30/2015 1053   CHOLHDL 3.0 03/05/2018  1017   VLDL 24 10/05/2016 0957   LDLCALC 92 03/05/2018 1017   LABVLDL 15 07/30/2015 1053    Lab Results  Component Value Date   HGBA1C  07/23/2007    5.7 (NOTE)   The ADA recommends the following therapeutic goals for glycemic   control related to Hgb A1C measurement:   Goal of Therapy:   < 7.0% Hgb A1C   Action Suggested:  > 8.0% Hgb A1C   Ref:  Diabetes Care, 22, Suppl. 1, 1999   No components found for: NTPROBNP Lab Results  Component Value Date   TSH 3.002 11/17/2019   TSH 4.73 (H) 12/05/2018   TSH 6.71 (H) 07/23/2018   IMPRESSION:    ICD-10-CM   1. Essential hypertension  I10 hydrochlorothiazide (MICROZIDE) 12.5 MG capsule    Basic metabolic panel    Magnesium    amLODipine (NORVASC) 10 MG tablet    doxazosin (CARDURA) 4 MG tablet  2. Paroxysmal atrial fibrillation (HCC)  I48.0   3. Long term (current) use of anticoagulants  Z79.01      RECOMMENDATIONS: SHERLYNN TOURVILLE is a 86 y.o. female whose past medical history and cardiac risk factors include: Hypothyroidism, hypertension, paroxysmal atrial fibrillation, postmenopausal female, and advanced age.  Paroxysmal atrial fibrillation: Currently normal sinus rhythm.  Newly diagnosed April 2021.  Rate control: Toprol-XL 100 mg p.o. daily   Rhythm control: N/A    Thromboembolic prophylaxis: Eliquis   CHA2DS2-VASc SCORE is 4 which correlates to 4 % risk of stroke per year.  Echocardiogram and nuclear stress test results reviewed with the patient at today's visit.  Independently reviewed the nuclear stress test perfusion images with the patient.  Patient is noted to have a small perfusion defect on the nuclear stress test which appears to be most likely secondary to underlying artifact from RV insertion.  Clinically patient is asymptomatic.  Shared decision is to treat the patient medically.  However she understands that if she has substernal chest pain or shortness of breath that is new onset or worsening she will seek medical attention at the closest ER via EMS for further evaluation.  Long-term oral anticoagulation:   Indication paroxysmal atrial fibrillation.    Patient does not endorse any evidence of bleeding. No prior history of intracranial bleed or internal bleeding.    Patient educated on the risks, benefits, and alternatives to oral anticoagulation she verbalizes understanding.  Benign essential hypertension:  Medications reviewed.  Blood pressure is currently not at goal, but improving.  Increase amlodipine to 10 mg p.o. every afternoon.  Will add hydrochlorothiazide 12.5 mg p.o. every morning.  This is secondary to elevated blood pressures and also echocardiogram noting elevated left atrial pressures.  Check BMP and magnesium in 1 week.  Will wean off doxazosin over the next 3 weeks.  Continue with ambulatory blood pressure monitoring.  Patient has hyponatremia on review of blood work.  Currently managed by primary team per patient.  FINAL MEDICATION LIST END OF ENCOUNTER: Meds ordered this encounter  Medications  . hydrochlorothiazide (MICROZIDE) 12.5 MG capsule    Sig: Take 1 capsule (12.5 mg total) by mouth in the morning.    Dispense:  90 capsule    Refill:  0  . amLODipine (NORVASC) 10 MG tablet    Sig: Take 1 tablet  (10 mg total) by mouth every evening.    Dispense:  90 tablet    Refill:  0  . doxazosin (CARDURA) 4 MG tablet    Sig: Take  0.5 tablets (2 mg total) by mouth daily.    Dispense:  90 tablet    Refill:  1    Medications Discontinued During This Encounter  Medication Reason  . amLODipine (NORVASC) 5 MG tablet Dose change  . doxazosin (CARDURA) 4 MG tablet      Current Outpatient Medications:  .  acetaminophen (TYLENOL) 500 MG tablet, Take 500 mg by mouth in the morning and at bedtime. , Disp: , Rfl:  .  ALPRAZolam (XANAX) 0.5 MG tablet, TAKE 1 TABLET BY MOUTH THREE TIMES A DAY AS NEEDED FOR ANXIETY, Disp: 90 tablet, Rfl: 5 .  apixaban (ELIQUIS) 2.5 MG TABS tablet, Take 1 tablet (2.5 mg total) by mouth 2 (two) times daily., Disp: 60 tablet, Rfl: 3 .  doxazosin (CARDURA) 4 MG tablet, Take 0.5 tablets (2 mg total) by mouth daily., Disp: 90 tablet, Rfl: 1 .  irbesartan (AVAPRO) 150 MG tablet, Take 1 tablet (150 mg total) by mouth daily., Disp: 90 tablet, Rfl: 3 .  KLOR-CON M20 20 MEQ tablet, TAKE 1 TABLET BY MOUTH EVERY DAY, Disp: 90 tablet, Rfl: 1 .  levothyroxine (SYNTHROID) 100 MCG tablet, TAKE 1 TABLET BY MOUTH EVERY DAY BEFORE BREAKFAST, Disp: 90 tablet, Rfl: 3 .  loratadine (CLARITIN) 10 MG tablet, Take 10 mg by mouth daily. , Disp: , Rfl:  .  metoprolol succinate (TOPROL XL) 100 MG 24 hr tablet, Take 1 tablet (100 mg total) by mouth daily for 90 doses. Hold if systolic blood pressure (top blood pressure number) less than 100 mmHg or heart rate less than 60 bpm (pulse)., Disp: , Rfl:  .  pantoprazole (PROTONIX) 40 MG tablet, TAKE 1 TABLET BY MOUTH EVERY DAY, Disp: 90 tablet, Rfl: 1 .  polyethylene glycol (MIRALAX / GLYCOLAX) packet, Take 17 g by mouth daily. , Disp: , Rfl:  .  Propylene Glycol (SYSTANE BALANCE) 0.6 % SOLN, Place 1 drop into both eyes 2 (two) times daily., Disp: , Rfl:  .  sertraline (ZOLOFT) 50 MG tablet, TAKE 1 TABLET BY MOUTH EVERY DAY TO CALM NERVES., Disp: 90 tablet,  Rfl: 1 .  temazepam (RESTORIL) 30 MG capsule, TAKE 1 CAPSULE BY MOUTH AT BEDTIME AS NEEDED FOR SLEEP, Disp: 30 capsule, Rfl: 5 .  amLODipine (NORVASC) 10 MG tablet, Take 1 tablet (10 mg total) by mouth every evening., Disp: 90 tablet, Rfl: 0 .  hydrochlorothiazide (MICROZIDE) 12.5 MG capsule, Take 1 capsule (12.5 mg total) by mouth in the morning., Disp: 90 capsule, Rfl: 0  Orders Placed This Encounter  Procedures  . Basic metabolic panel  . Magnesium   Patient Instructions  Please remember to bring in your medication bottles in at the next visit.   New Medications that were added at today's visit:  Hydrochlorothiazide 12.5 mg p.o. every morning  Medications that were changed at today's office visit: Patient is asked to wean off of doxazosin. Week 1: Doxazosin 2 mg p.o. daily Week 2: Doxazosin 2 mg p.o. every other day. Week 3: Stop  Increase amlodipine to 10 mg p.o. every afternoon.  Please get labs done in about 1 week after starting hydrochlorothiazide at the nearest [facility].  Recommend follow up with your PCP as scheduled.   --Continue cardiac medications as reconciled in final medication list. --Return in about 4 weeks (around 02/04/2020) for BP follow up.. Or sooner if needed. --Continue follow-up with your primary care physician regarding the management of your other chronic comorbid conditions.  Patient's questions and concerns were addressed to  her satisfaction. She voices understanding of the instructions provided during this encounter.   This note was created using a voice recognition software as a result there may be grammatical errors inadvertently enclosed that do not reflect the nature of this encounter. Every attempt is made to correct such errors.  Rex Kras, [place], [facility]  Pager: [phone] Office: [phone]

## 2020-01-09 ENCOUNTER — Encounter (INDEPENDENT_AMBULATORY_CARE_PROVIDER_SITE_OTHER): Payer: Medicare Other | Admitting: Ophthalmology

## 2020-01-10 DIAGNOSIS — I10 Essential (primary) hypertension: Secondary | ICD-10-CM | POA: Diagnosis not present

## 2020-01-14 DIAGNOSIS — I10 Essential (primary) hypertension: Secondary | ICD-10-CM | POA: Diagnosis not present

## 2020-01-15 ENCOUNTER — Other Ambulatory Visit: Payer: Self-pay | Admitting: Pharmacist

## 2020-01-15 DIAGNOSIS — I10 Essential (primary) hypertension: Secondary | ICD-10-CM

## 2020-01-15 LAB — BASIC METABOLIC PANEL
BUN/Creatinine Ratio: 24 (ref 12–28)
BUN: 22 mg/dL (ref 8–27)
CO2: 31 mmol/L — ABNORMAL HIGH (ref 20–29)
Calcium: 9.9 mg/dL (ref 8.7–10.3)
Chloride: 92 mmol/L — ABNORMAL LOW (ref 96–106)
Creatinine, Ser: 0.91 mg/dL (ref 0.57–1.00)
GFR calc Af Amer: 66 mL/min/{1.73_m2} (ref >59)
GFR calc non Af Amer: 57 mL/min/{1.73_m2} — ABNORMAL LOW (ref >59)
Glucose: 137 mg/dL — ABNORMAL HIGH (ref 65–99)
Potassium: 4.3 mmol/L (ref 3.5–5.2)
Sodium: 136 mmol/L (ref 134–144)

## 2020-01-15 LAB — MAGNESIUM: Magnesium: 2 mg/dL (ref 1.6–2.3)

## 2020-01-16 MED ORDER — IRBESARTAN 150 MG PO TABS: 150.0000 mg | ORAL_TABLET | Freq: Every day | ORAL | 3 refills | Status: AC

## 2020-01-17 ENCOUNTER — Encounter (INDEPENDENT_AMBULATORY_CARE_PROVIDER_SITE_OTHER): Payer: Medicare Other | Admitting: Ophthalmology

## 2020-01-20 ENCOUNTER — Encounter (INDEPENDENT_AMBULATORY_CARE_PROVIDER_SITE_OTHER): Payer: Self-pay | Admitting: Ophthalmology

## 2020-01-20 ENCOUNTER — Other Ambulatory Visit: Payer: Self-pay

## 2020-01-20 ENCOUNTER — Ambulatory Visit (INDEPENDENT_AMBULATORY_CARE_PROVIDER_SITE_OTHER): Payer: Medicare Other | Admitting: Ophthalmology

## 2020-01-20 DIAGNOSIS — H353211 Exudative age-related macular degeneration, right eye, with active choroidal neovascularization: Secondary | ICD-10-CM

## 2020-01-20 MED ORDER — BEVACIZUMAB CHEMO INJECTION 1.25MG/0.05ML SYRINGE FOR KALEIDOSCOPE
1.2500 mg | INTRAVITREAL | Status: AC | PRN
  Administered 2020-01-20: 1.25 mg via INTRAVITREAL

## 2020-01-20 NOTE — Assessment & Plan Note (Signed)
Repeat intravitreal Avastin OD today in the eye with best chance for maintaining visual functioning and ambulatory vision

## 2020-01-20 NOTE — Progress Notes (Signed)
01/20/2020     CHIEF COMPLAINT Patient presents for Retina Follow Up   HISTORY OF PRESENT ILLNESS: [name] is a 86 y.o. female who presents to the clinic today for:   HPI    Retina Follow Up    Patient presents with  Wet AMD.  In right eye.  Duration of 5 weeks.  Since onset it is stable.          Comments    5 week follow up - OCT OU, Poss Avastin OD Patient denies change in vision and overall has no complaints.        Last edited by Berenice Bouton on 01/20/2020  2:19 PM. (History)      Referring physician: Kermit Balo, DO [address] [place],  [place] [address]  HISTORICAL INFORMATION:   Selected notes from the MEDICAL RECORD NUMBER    Lab Results  Component Value Date   HGBA1C  07/23/2007    5.7 (NOTE)   The ADA recommends the following therapeutic goals for glycemic   control related to Hgb A1C measurement:   Goal of Therapy:   < 7.0% Hgb A1C   Action Suggested:  > 8.0% Hgb A1C   Ref:  Diabetes Care, 22, Suppl. 1, 1999     CURRENT MEDICATIONS: Current Outpatient Medications (Ophthalmic Drugs)  Medication Sig  . Propylene Glycol (SYSTANE BALANCE) 0.6 % SOLN Place 1 drop into both eyes 2 (two) times daily.   No current facility-administered medications for this visit. (Ophthalmic Drugs)   Current Outpatient Medications (Other)  Medication Sig  . acetaminophen (TYLENOL) 500 MG tablet Take 500 mg by mouth in the morning and at bedtime.   . ALPRAZolam (XANAX) 0.5 MG tablet TAKE 1 TABLET BY MOUTH THREE TIMES A DAY AS NEEDED FOR ANXIETY  . amLODipine (NORVASC) 10 MG tablet Take 1 tablet (10 mg total) by mouth every evening.  Marland Kitchen apixaban (ELIQUIS) 2.5 MG TABS tablet Take 1 tablet (2.5 mg total) by mouth 2 (two) times daily.  Marland Kitchen doxazosin (CARDURA) 4 MG tablet Take 0.5 tablets (2 mg total) by mouth daily. (Patient not taking: Reported on 01/20/2020)  . hydrochlorothiazide (MICROZIDE) 12.5 MG capsule Take 1 capsule (12.5 mg total) by mouth in the morning.  .  irbesartan (AVAPRO) 150 MG tablet Take 1 tablet (150 mg total) by mouth daily.  Marland Kitchen KLOR-CON M20 20 MEQ tablet TAKE 1 TABLET BY MOUTH EVERY DAY  . levothyroxine (SYNTHROID) 100 MCG tablet TAKE 1 TABLET BY MOUTH EVERY DAY BEFORE BREAKFAST  . loratadine (CLARITIN) 10 MG tablet Take 10 mg by mouth daily.   . metoprolol succinate (TOPROL XL) 100 MG 24 hr tablet Take 1 tablet (100 mg total) by mouth daily for 90 doses. Hold if systolic blood pressure (top blood pressure number) less than 100 mmHg or heart rate less than 60 bpm (pulse).  . pantoprazole (PROTONIX) 40 MG tablet TAKE 1 TABLET BY MOUTH EVERY DAY  . polyethylene glycol (MIRALAX / GLYCOLAX) packet Take 17 g by mouth daily.   . sertraline (ZOLOFT) 50 MG tablet TAKE 1 TABLET BY MOUTH EVERY DAY TO CALM NERVES.  Marland Kitchen temazepam (RESTORIL) 30 MG capsule TAKE 1 CAPSULE BY MOUTH AT BEDTIME AS NEEDED FOR SLEEP   No current facility-administered medications for this visit. (Other)      REVIEW OF SYSTEMS:    ALLERGIES Allergies  Allergen Reactions  . Ambien [Zolpidem Tartrate]     hallucination  . Bactrim [Sulfamethoxazole-Trimethoprim]  Unknown reaction   . Morphine And Related Nausea And Vomiting  . Zithromax [Azithromycin]     Not effective      PAST MEDICAL HISTORY Past Medical History:  Diagnosis Date  . Allergic rhinitis due to pollen   . Anxiety state, unspecified   . Calculus of kidney   . Cataract   . Diaphragmatic hernia without mention of obstruction or gangrene   . Diffuse cystic mastopathy   . Dizziness and giddiness   . GERD (gastroesophageal reflux disease)   . Hiatal hernia   . Insomnia, unspecified   . Insomnia, unspecified   . Lumbago   . Macular degeneration (senile) of retina, unspecified   . Open wound of toe(s), without mention of complication   . Osteoarthrosis, unspecified whether generalized or localized, unspecified site   . Other and unspecified hyperlipidemia   . Personal history of fall   .  Sciatica   . Sebaceous cyst   . Senile osteoporosis   . Unspecified constipation   . Unspecified essential hypertension   . Unspecified hereditary and idiopathic peripheral neuropathy   . Unspecified hypothyroidism   . Unspecified tinnitus    Past Surgical History:  Procedure Laterality Date  . ABDOMINAL HYSTERECTOMY    . CATARACT EXTRACTION, BILATERAL    . CHOLECYSTECTOMY  07/25/2007  . TONSILLECTOMY AND ADENOIDECTOMY Bilateral 1943    FAMILY HISTORY Family History  Problem Relation Age of Onset  . Hypertension Brother   . Fibromyalgia Daughter   . Hypertension Son   . Hypertension Brother   . Hyperlipidemia Brother   . Breast cancer Neg Hx     SOCIAL HISTORY Social History   Tobacco Use  . Smoking status: Never Smoker  . Smokeless tobacco: Never Used  Substance Use Topics  . Alcohol use: No    Alcohol/week: 0.0 standard drinks  . Drug use: No         OPHTHALMIC EXAM:  Base Eye Exam    Visual Acuity (Snellen - Linear)      Right Left   Dist cc 20/60+1 20/400   Dist ph cc NI NI   Correction: Glasses       Tonometry (Tonopen, 2:30 PM)      Right Left   Pressure 10 12       Pupils      Pupils Dark Light Shape React APD   Right PERRL 3 2 Round Brisk None   Left PERRL 3 2 Round Brisk None       Visual Fields (Counting fingers)      Left Right     Full   Restrictions Partial outer inferior nasal deficiency        Extraocular Movement      Right Left    Full Full       Neuro/Psych    Oriented x3: Yes   Mood/Affect: Normal       Dilation    Right eye: 1.0% Mydriacyl, 2.5% Phenylephrine @ 2:30 PM        Slit Lamp and Fundus Exam    External Exam      Right Left   External Normal Normal       Slit Lamp Exam      Right Left   Lids/Lashes Normal Normal   Conjunctiva/Sclera White and quiet White and quiet   Cornea Clear Clear   Anterior Chamber Deep and quiet Deep and quiet   Iris Round and reactive Round and reactive   Lens  Posterior  chamber intraocular lens Posterior chamber intraocular lens   Anterior Vitreous Normal Normal       Fundus Exam      Right Left   Posterior Vitreous Normal    Disc Normal    C/D Ratio 0.0 0.3   Macula Geographic atrophy, Advanced age related macular degeneration, Disciform scar, Macular thickening, Subretinal neovascular membrane, Subretinal hemorrhage smaller temporal to the pannus.    Vessels Normal    Periphery Normal           IMAGING AND PROCEDURES  Imaging and Procedures for 01/20/20  OCT, Retina - OU - Both Eyes       Right Eye Quality was good. Scan locations included subfoveal. Central Foveal Thickness: 362. Progression has improved. Findings include abnormal foveal contour, subretinal fluid, central retinal atrophy, outer retinal atrophy, intraretinal fluid, disciform scar, subretinal hyper-reflective material.   Left Eye Quality was good. Scan locations included subfoveal. Central Foveal Thickness: 353. Progression has improved. Findings include abnormal foveal contour.   Notes OD much less CME temporal to the fovea.  Much less scarring centrally.  5-week status post recent intravitreal Avastin.  OS with extensive choroidal neovascular membrane Also much less extensive status post Avastin       Intravitreal Injection, Pharmacologic Agent - OD - Right Eye       Time Out 01/20/2020. 3:29 PM. Confirmed correct patient, procedure, site, and patient consented.   Anesthesia Topical anesthesia was used. Anesthetic medications included Akten 3.5%.   Procedure Preparation included 10% betadine to eyelids, Tobramycin 0.3%.   Injection:  1.25 mg Bevacizumab (AVASTIN) SOLN   NDC: [address]-9, Lot: [id number]   Route: Intravitreal, Site: Right Eye, Waste: 0 mg  Post-op Post injection exam found visual acuity of at least counting fingers. The patient tolerated the procedure well. There were no complications. The patient received written and verbal post  procedure care education. Post injection medications were not given.                 ASSESSMENT/PLAN:  Exudative age-related macular degeneration of right eye with active choroidal neovascularization (HCC) Repeat intravitreal Avastin OD today in the eye with best chance for maintaining visual functioning and ambulatory vision      ICD-10-CM   1. Exudative age-related macular degeneration of right eye with active choroidal neovascularization (HCC)  H35.3211 OCT, Retina - OU - Both Eyes    Intravitreal Injection, Pharmacologic Agent - OD - Right Eye    Bevacizumab (AVASTIN) SOLN 1.25 mg    1.  OD, eye with best chance at visual functioning and ambulatory vision maintenance.  Improved massive subretinal hemorrhage and disciform active choroidal neovascular membrane temporally.  Improved after recent Avastin patient in 5 weeks  2.  OS, repeat examination is scheduled  3.  Ophthalmic Meds Ordered this visit:  Meds ordered this encounter  Medications  . Bevacizumab (AVASTIN) SOLN 1.25 mg       Return in about 5 weeks (around 02/24/2020), or ,, OS return as scheduled, for dilate, OD, AVASTIN OCT.  There are no Patient Instructions on file for this visit.   Explained the diagnoses, plan, and follow up with the patient and they expressed understanding.  Patient expressed understanding of the importance of proper follow up care.   Alford Highland Shavelle Runkel M.D. Diseases & Surgery of the Retina and Vitreous [facility] 01/20/20     Abbreviations: M myopia (nearsighted); A astigmatism; H hyperopia (farsighted); P presbyopia; Mrx spectacle prescription;  CTL contact  lenses; OD right eye; OS left eye; OU both eyes  XT exotropia; ET esotropia; PEK punctate epithelial keratitis; PEE punctate epithelial erosions; DES dry eye syndrome; MGD meibomian gland dysfunction; ATs artificial tears; PFAT's preservative free artificial tears; [place] nuclear sclerotic cataract; PSC posterior  subcapsular cataract; ERM epi-retinal membrane; PVD posterior vitreous detachment; RD retinal detachment; DM diabetes mellitus; DR diabetic retinopathy; NPDR non-proliferative diabetic retinopathy; PDR proliferative diabetic retinopathy; CSME clinically significant macular edema; DME diabetic macular edema; dbh dot blot hemorrhages; CWS cotton wool spot; POAG primary open angle glaucoma; C/D cup-to-disc ratio; HVF humphrey visual field; GVF goldmann visual field; OCT optical coherence tomography; IOP intraocular pressure; BRVO Branch retinal vein occlusion; CRVO central retinal vein occlusion; CRAO central retinal artery occlusion; BRAO branch retinal artery occlusion; RT retinal tear; SB scleral buckle; PPV pars plana vitrectomy; VH Vitreous hemorrhage; PRP panretinal laser photocoagulation; IVK intravitreal kenalog; VMT vitreomacular traction; MH Macular hole;  NVD neovascularization of the disc; NVE neovascularization elsewhere; AREDS age related eye disease study; ARMD age related macular degeneration; POAG primary open angle glaucoma; EBMD epithelial/anterior basement membrane dystrophy; ACIOL anterior chamber intraocular lens; IOL intraocular lens; PCIOL posterior chamber intraocular lens; Phaco/IOL phacoemulsification with intraocular lens placement; [place] photorefractive keratectomy; LASIK laser assisted in situ keratomileusis; HTN hypertension; DM diabetes mellitus; COPD chronic obstructive pulmonary disease

## 2020-01-22 ENCOUNTER — Ambulatory Visit (INDEPENDENT_AMBULATORY_CARE_PROVIDER_SITE_OTHER): Payer: Medicare Other | Admitting: Ophthalmology

## 2020-01-22 ENCOUNTER — Other Ambulatory Visit: Payer: Self-pay

## 2020-01-22 ENCOUNTER — Encounter (INDEPENDENT_AMBULATORY_CARE_PROVIDER_SITE_OTHER): Payer: Self-pay | Admitting: Ophthalmology

## 2020-01-22 DIAGNOSIS — H353221 Exudative age-related macular degeneration, left eye, with active choroidal neovascularization: Secondary | ICD-10-CM

## 2020-01-22 DIAGNOSIS — H353211 Exudative age-related macular degeneration, right eye, with active choroidal neovascularization: Secondary | ICD-10-CM

## 2020-01-22 MED ORDER — BEVACIZUMAB CHEMO INJECTION 1.25MG/0.05ML SYRINGE FOR KALEIDOSCOPE
1.2500 mg | INTRAVITREAL | Status: AC | PRN
  Administered 2020-01-22: 1.25 mg via INTRAVITREAL

## 2020-01-22 NOTE — Assessment & Plan Note (Signed)
Improved OD, 2 days status post intravitreal Avastin

## 2020-01-22 NOTE — Assessment & Plan Note (Signed)
Active large disciform scar temporal aspect of the clinicians macula with subretinal hemorrhage controlled by intravitreal Avastin repeat today in 6 weeks per patient request

## 2020-01-22 NOTE — Progress Notes (Signed)
01/22/2020     CHIEF COMPLAINT Patient presents for Retina Follow Up   HISTORY OF PRESENT ILLNESS: [name] is a 86 y.o. female who presents to the clinic today for:   HPI    Retina Follow Up    Patient presents with  Wet AMD.  In left eye.  This started 6 weeks ago.  Severity is mild.  Duration of 6 weeks.  Since onset it is stable.          Comments    6 Week AMD F/U OS, poss Avastin OS  Pt denies noticeable changes to [place] OU since last visit. Pt denies ocular pain, flashes of light, or floaters OU.         Last edited by Ileana Roup, COA on 01/22/2020  9:30 AM. (History)      Referring physician: Kermit Balo, DO [address] [place],  [place] [address]  HISTORICAL INFORMATION:   Selected notes from the MEDICAL RECORD NUMBER    Lab Results  Component Value Date   HGBA1C  07/23/2007    5.7 (NOTE)   The ADA recommends the following therapeutic goals for glycemic   control related to Hgb A1C measurement:   Goal of Therapy:   < 7.0% Hgb A1C   Action Suggested:  > 8.0% Hgb A1C   Ref:  Diabetes Care, 22, Suppl. 1, 1999     CURRENT MEDICATIONS: Current Outpatient Medications (Ophthalmic Drugs)  Medication Sig  . Propylene Glycol (SYSTANE BALANCE) 0.6 % SOLN Place 1 drop into both eyes 2 (two) times daily.   No current facility-administered medications for this visit. (Ophthalmic Drugs)   Current Outpatient Medications (Other)  Medication Sig  . acetaminophen (TYLENOL) 500 MG tablet Take 500 mg by mouth in the morning and at bedtime.   . ALPRAZolam (XANAX) 0.5 MG tablet TAKE 1 TABLET BY MOUTH THREE TIMES A DAY AS NEEDED FOR ANXIETY  . amLODipine (NORVASC) 10 MG tablet Take 1 tablet (10 mg total) by mouth every evening.  Marland Kitchen apixaban (ELIQUIS) 2.5 MG TABS tablet Take 1 tablet (2.5 mg total) by mouth 2 (two) times daily.  Marland Kitchen doxazosin (CARDURA) 4 MG tablet Take 0.5 tablets (2 mg total) by mouth daily. (Patient not taking: Reported on 01/20/2020)  .  hydrochlorothiazide (MICROZIDE) 12.5 MG capsule Take 1 capsule (12.5 mg total) by mouth in the morning.  . irbesartan (AVAPRO) 150 MG tablet Take 1 tablet (150 mg total) by mouth daily.  Marland Kitchen KLOR-CON M20 20 MEQ tablet TAKE 1 TABLET BY MOUTH EVERY DAY  . levothyroxine (SYNTHROID) 100 MCG tablet TAKE 1 TABLET BY MOUTH EVERY DAY BEFORE BREAKFAST  . loratadine (CLARITIN) 10 MG tablet Take 10 mg by mouth daily.   . metoprolol succinate (TOPROL XL) 100 MG 24 hr tablet Take 1 tablet (100 mg total) by mouth daily for 90 doses. Hold if systolic blood pressure (top blood pressure number) less than 100 mmHg or heart rate less than 60 bpm (pulse).  . pantoprazole (PROTONIX) 40 MG tablet TAKE 1 TABLET BY MOUTH EVERY DAY  . polyethylene glycol (MIRALAX / GLYCOLAX) packet Take 17 g by mouth daily.   . sertraline (ZOLOFT) 50 MG tablet TAKE 1 TABLET BY MOUTH EVERY DAY TO CALM NERVES.  Marland Kitchen temazepam (RESTORIL) 30 MG capsule TAKE 1 CAPSULE BY MOUTH AT BEDTIME AS NEEDED FOR SLEEP   No current facility-administered medications for this visit. (Other)      REVIEW OF SYSTEMS:    ALLERGIES  Allergies  Allergen Reactions  . Ambien [Zolpidem Tartrate]     hallucination  . Bactrim [Sulfamethoxazole-Trimethoprim]     Unknown reaction   . Morphine And Related Nausea And Vomiting  . Zithromax [Azithromycin]     Not effective      PAST MEDICAL HISTORY Past Medical History:  Diagnosis Date  . Allergic rhinitis due to pollen   . Anxiety state, unspecified   . Calculus of kidney   . Cataract   . Diaphragmatic hernia without mention of obstruction or gangrene   . Diffuse cystic mastopathy   . Dizziness and giddiness   . GERD (gastroesophageal reflux disease)   . Hiatal hernia   . Insomnia, unspecified   . Insomnia, unspecified   . Lumbago   . Macular degeneration (senile) of retina, unspecified   . Open wound of toe(s), without mention of complication   . Osteoarthrosis, unspecified whether generalized or  localized, unspecified site   . Other and unspecified hyperlipidemia   . Personal history of fall   . Sciatica   . Sebaceous cyst   . Senile osteoporosis   . Unspecified constipation   . Unspecified essential hypertension   . Unspecified hereditary and idiopathic peripheral neuropathy   . Unspecified hypothyroidism   . Unspecified tinnitus    Past Surgical History:  Procedure Laterality Date  . ABDOMINAL HYSTERECTOMY    . CATARACT EXTRACTION, BILATERAL    . CHOLECYSTECTOMY  07/25/2007  . TONSILLECTOMY AND ADENOIDECTOMY Bilateral 1943    FAMILY HISTORY Family History  Problem Relation Age of Onset  . Hypertension Brother   . Fibromyalgia Daughter   . Hypertension Son   . Hypertension Brother   . Hyperlipidemia Brother   . Breast cancer Neg Hx     SOCIAL HISTORY Social History   Tobacco Use  . Smoking status: Never Smoker  . Smokeless tobacco: Never Used  Substance Use Topics  . Alcohol use: No    Alcohol/week: 0.0 standard drinks  . Drug use: No         OPHTHALMIC EXAM: Base Eye Exam    Visual Acuity (ETDRS)      Right Left   Dist cc 20/50 -2 20/400   Dist ph cc NI NI   Correction: Glasses       Tonometry (Tonopen, 9:34 AM)      Right Left   Pressure 10 10       Pupils      Pupils Dark Light Shape React APD   Right PERRL 3 2 Round Brisk None   Left PERRL 3 2 Round Brisk None       Visual Fields (Counting fingers)      Left Right     Full   Restrictions Partial outer inferior nasal deficiency        Extraocular Movement      Right Left    Full Full       Neuro/Psych    Oriented x3: Yes   Mood/Affect: Normal       Dilation    Left eye: 1.0% Mydriacyl, 2.5% Phenylephrine @ 9:34 AM        Slit Lamp and Fundus Exam    External Exam      Right Left   External Normal Normal       Slit Lamp Exam      Right Left   Lids/Lashes Normal Normal   Conjunctiva/Sclera White and quiet White and quiet   Cornea Clear Clear   Anterior  Chamber Deep and quiet Deep and quiet   Iris Round and reactive Round and reactive   Lens Posterior chamber intraocular lens Posterior chamber intraocular lens   Anterior Vitreous Normal Normal       Fundus Exam      Right Left   Posterior Vitreous  Normal   Disc  Normal   C/D Ratio  0.3   Macula  Geographic atrophy, Advanced age related macular degeneration, Disciform scar, Macular thickening, Subretinal neovascular membrane   Vessels  Normal   Periphery  Normal          IMAGING AND PROCEDURES  Imaging and Procedures for 01/22/20           ASSESSMENT/PLAN:  No problem-specific Assessment & Plan notes found for this encounter.      ICD-10-CM   1. Exudative age-related macular degeneration of left eye with active choroidal neovascularization (HCC)  H35.3221 OCT, Retina - OU - Both Eyes    1.  2.  3.  Ophthalmic Meds Ordered this visit:  No orders of the defined types were placed in this encounter.      Return in about 6 weeks (around 03/04/2020) for dilate, OS, AVASTIN OCT.  There are no Patient Instructions on file for this visit.   Explained the diagnoses, plan, and follow up with the patient and they expressed understanding.  Patient expressed understanding of the importance of proper follow up care.   Alford Highland Antoinetta Berrones M.D. Diseases & Surgery of the Retina and Vitreous [facility] 01/22/20     Abbreviations: M myopia (nearsighted); A astigmatism; H hyperopia (farsighted); P presbyopia; Mrx spectacle prescription;  CTL contact lenses; OD right eye; OS left eye; OU both eyes  XT exotropia; ET esotropia; PEK punctate epithelial keratitis; PEE punctate epithelial erosions; DES dry eye syndrome; MGD meibomian gland dysfunction; ATs artificial tears; PFAT's preservative free artificial tears; [place] nuclear sclerotic cataract; PSC posterior subcapsular cataract; ERM epi-retinal membrane; PVD posterior vitreous detachment; RD retinal detachment;  DM diabetes mellitus; DR diabetic retinopathy; NPDR non-proliferative diabetic retinopathy; PDR proliferative diabetic retinopathy; CSME clinically significant macular edema; DME diabetic macular edema; dbh dot blot hemorrhages; CWS cotton wool spot; POAG primary open angle glaucoma; C/D cup-to-disc ratio; HVF humphrey visual field; GVF goldmann visual field; OCT optical coherence tomography; IOP intraocular pressure; BRVO Branch retinal vein occlusion; CRVO central retinal vein occlusion; CRAO central retinal artery occlusion; BRAO branch retinal artery occlusion; RT retinal tear; SB scleral buckle; PPV pars plana vitrectomy; VH Vitreous hemorrhage; PRP panretinal laser photocoagulation; IVK intravitreal kenalog; VMT vitreomacular traction; MH Macular hole;  NVD neovascularization of the disc; NVE neovascularization elsewhere; AREDS age related eye disease study; ARMD age related macular degeneration; POAG primary open angle glaucoma; EBMD epithelial/anterior basement membrane dystrophy; ACIOL anterior chamber intraocular lens; IOL intraocular lens; PCIOL posterior chamber intraocular lens; Phaco/IOL phacoemulsification with intraocular lens placement; PRK photorefractive keratectomy; LASIK laser assisted in situ keratomileusis; HTN hypertension; DM diabetes mellitus; COPD chronic obstructive pulmonary disease

## 2020-01-23 ENCOUNTER — Telehealth: Payer: Self-pay

## 2020-01-23 NOTE — Telephone Encounter (Signed)
-----   Message from [place], [place] sent at 01/23/2020 11:27 AM EDT ----- Results reviewed. Serum potassium and magnesium levels are within normal limits. Kidney function is relatively at baseline. Continue current medical therapy. Call if questions arise

## 2020-01-23 NOTE — Telephone Encounter (Signed)
Gave results to patient, she verbalized understanding.

## 2020-02-04 ENCOUNTER — Other Ambulatory Visit: Payer: Self-pay

## 2020-02-04 ENCOUNTER — Ambulatory Visit (INDEPENDENT_AMBULATORY_CARE_PROVIDER_SITE_OTHER): Payer: Medicare Other | Admitting: Family

## 2020-02-04 ENCOUNTER — Encounter: Payer: Self-pay | Admitting: Family

## 2020-02-04 DIAGNOSIS — Z0000 Encounter for general adult medical examination without abnormal findings: Secondary | ICD-10-CM | POA: Diagnosis not present

## 2020-02-04 NOTE — Progress Notes (Signed)
Subjective:   [name] is a 86 y.o. female who presents for Medicare Annual (Subsequent) preventive examination.  Review of Systems    Cardiac Risk Factors include: advanced age (>18men, >39 women);hypertension;family history of premature cardiovascular disease     Objective:    There were no vitals filed for this visit. There is no height or weight on file to calculate BMI.  Advanced Directives 02/04/2020 12/02/2019 11/18/2019 10/29/2019 09/02/2019 07/19/2019 05/02/2019  Does Patient Have a Medical Advance Directive? Yes Yes Yes Yes Yes Yes Yes  Type of Advance Directive Living will;Out of facility DNR (pink MOST or yellow form) Healthcare Power of [place];Out of facility DNR (pink MOST or yellow form) Out of facility DNR (pink MOST or yellow form) Healthcare Power of [place];Out of facility DNR (pink MOST or yellow form);Living will Healthcare Power of [place];Living will;Out of facility DNR (pink MOST or yellow form) Living will;Out of facility DNR (pink MOST or yellow form) Out of facility DNR (pink MOST or yellow form)  Does patient want to make changes to medical advance directive? No - Patient declined No - Patient declined No - Patient declined No - Patient declined No - Patient declined No - Patient declined No - Patient declined  Copy of Healthcare Power of Attorney in Chart? - - Yes - validated most recent copy scanned in chart (See row information) Yes - validated most recent copy scanned in chart (See row information) - - -  Would patient like information on creating a medical advance directive? - - - - - - -  Pre-existing out of facility DNR order (yellow form or pink MOST form) - Pink MOST/Yellow Form most recent copy in chart - Physician notified to receive inpatient order Pink MOST/Yellow Form most recent copy in chart - Physician notified to receive inpatient order Pink MOST form placed in chart (order not valid for inpatient use) - - Yellow form placed in chart (order not  valid for inpatient use)    Current Medications (verified) Outpatient Encounter Medications as of 02/04/2020  Medication Sig  . acetaminophen (TYLENOL) 500 MG tablet Take 500 mg by mouth in the morning and at bedtime.   . ALPRAZolam (XANAX) 0.5 MG tablet TAKE 1 TABLET BY MOUTH THREE TIMES A DAY AS NEEDED FOR ANXIETY  . amLODipine (NORVASC) 10 MG tablet Take 1 tablet (10 mg total) by mouth every evening.  Marland Kitchen apixaban (ELIQUIS) 2.5 MG TABS tablet Take 1 tablet (2.5 mg total) by mouth 2 (two) times daily.  . hydrochlorothiazide (MICROZIDE) 12.5 MG capsule Take 1 capsule (12.5 mg total) by mouth in the morning.  . irbesartan (AVAPRO) 150 MG tablet Take 1 tablet (150 mg total) by mouth daily.  Marland Kitchen KLOR-CON M20 20 MEQ tablet TAKE 1 TABLET BY MOUTH EVERY DAY  . levothyroxine (SYNTHROID) 100 MCG tablet TAKE 1 TABLET BY MOUTH EVERY DAY BEFORE BREAKFAST  . loratadine (CLARITIN) 10 MG tablet Take 10 mg by mouth daily.   . metoprolol succinate (TOPROL XL) 100 MG 24 hr tablet Take 1 tablet (100 mg total) by mouth daily for 90 doses. Hold if systolic blood pressure (top blood pressure number) less than 100 mmHg or heart rate less than 60 bpm (pulse).  . pantoprazole (PROTONIX) 40 MG tablet TAKE 1 TABLET BY MOUTH EVERY DAY  . polyethylene glycol (MIRALAX / GLYCOLAX) packet Take 17 g by mouth daily.   Marland Kitchen Propylene Glycol (SYSTANE BALANCE) 0.6 % SOLN Place 1 drop into both eyes 2 (two) times daily.  Marland Kitchen  sertraline (ZOLOFT) 50 MG tablet TAKE 1 TABLET BY MOUTH EVERY DAY TO CALM NERVES.  Marland Kitchen temazepam (RESTORIL) 30 MG capsule TAKE 1 CAPSULE BY MOUTH AT BEDTIME AS NEEDED FOR SLEEP  . [DISCONTINUED] doxazosin (CARDURA) 4 MG tablet Take 0.5 tablets (2 mg total) by mouth daily. (Patient not taking: Reported on 01/20/2020)   No facility-administered encounter medications on file as of 02/04/2020.    Allergies (verified) Ambien [zolpidem tartrate], Bactrim [sulfamethoxazole-trimethoprim], Morphine and related, and Zithromax  [azithromycin]   History: Past Medical History:  Diagnosis Date  . Allergic rhinitis due to pollen   . Anxiety state, unspecified   . Calculus of kidney   . Cataract   . Diaphragmatic hernia without mention of obstruction or gangrene   . Diffuse cystic mastopathy   . Dizziness and giddiness   . GERD (gastroesophageal reflux disease)   . Hiatal hernia   . Insomnia, unspecified   . Insomnia, unspecified   . Lumbago   . Macular degeneration (senile) of retina, unspecified   . Open wound of toe(s), without mention of complication   . Osteoarthrosis, unspecified whether generalized or localized, unspecified site   . Other and unspecified hyperlipidemia   . Personal history of fall   . Sciatica   . Sebaceous cyst   . Senile osteoporosis   . Unspecified constipation   . Unspecified essential hypertension   . Unspecified hereditary and idiopathic peripheral neuropathy   . Unspecified hypothyroidism   . Unspecified tinnitus    Past Surgical History:  Procedure Laterality Date  . ABDOMINAL HYSTERECTOMY    . CATARACT EXTRACTION, BILATERAL    . CHOLECYSTECTOMY  07/25/2007  . TONSILLECTOMY AND ADENOIDECTOMY Bilateral 1943   Family History  Problem Relation Age of Onset  . Hypertension Brother   . Fibromyalgia Daughter   . Hypertension Son   . Hypertension Brother   . Hyperlipidemia Brother   . Breast cancer Neg Hx    Social History   Socioeconomic History  . Marital status: Married    Spouse name: Not on file  . Number of children: 2  . Years of education: Not on file  . Highest education level: Not on file  Occupational History  . Occupation: retired  Tobacco Use  . Smoking status: Never Smoker  . Smokeless tobacco: Never Used  Vaping Use  . Vaping Use: Never used  Substance and Sexual Activity  . Alcohol use: No    Alcohol/week: 0.0 standard drinks  . Drug use: No  . Sexual activity: Never  Other Topics Concern  . Not on file  Social History Narrative  . Not  on file   Social Determinants of Health   Financial Resource Strain:   . Difficulty of Paying Living Expenses:   Food Insecurity:   . Worried About Programme researcher, broadcasting/film/video in the Last Year:   . Barista in the Last Year:   Transportation Needs:   . Freight forwarder (Medical):   Marland Kitchen Lack of Transportation (Non-Medical):   Physical Activity:   . Days of Exercise per Week:   . Minutes of Exercise per Session:   Stress:   . Feeling of Stress :   Social Connections:   . Frequency of Communication with Friends and Family:   . Frequency of Social Gatherings with Friends and Family:   . Attends Religious Services:   . Active Member of Clubs or Organizations:   . Attends Banker Meetings:   Marland Kitchen Marital Status:  Tobacco Counseling Counseling given: Not Answered   Clinical Intake:  Pre-visit preparation completed: No  Pain : 0-10 Pain Type: Chronic pain, Neuropathic pain Pain Location: Foot (generalized arthritis) Pain Orientation: Other (Comment) (feet) Pain Descriptors / Indicators: Burning, Aching Pain Onset: Other (comment) (several years) Pain Frequency: Constant Pain Relieving Factors: tyelnol Effect of Pain on Daily Activities: Walkning  Pain Relieving Factors: tyelnol  BMI - recorded: 20.83 Nutritional Status: BMI of 19-24  Normal Nutritional Risks: None Diabetes: No  How often do you need to have someone help you when you read instructions, pamphlets, or other written materials from your doctor or pharmacy?: 1 - Never What is the last grade level you completed in school?: 3 yrs [employer]  Diabetic?no   Interpreter Needed?: No  Information entered by :: Ayannah Faddis FNP-C   Activities of Daily Living In your present state of health, do you have any difficulty performing the following activities: 02/04/2020  Hearing? N  Vision? Y  Comment left eye legally blind  Difficulty concentrating or making decisions? N  Walking or  climbing stairs? Y  Comment uses a walker  Dressing or bathing? N  Doing errands, shopping? Y  Comment husband drives and daughter does Psychologist, clinicalshopping  Preparing Food and eating ? Y  Comment impaired vision.eats at the facility  Using the Toilet? N  Comment miralax effective  In the past six months, have you accidently leaked urine? Y  Comment wears depends  Do you have problems with loss of bowel control? N  Managing your Medications? Y  Comment husband assist  Managing your Finances? Y  Comment husband assist  Housekeeping or managing your Housekeeping? Y  Comment has Programmer, applicationshouse keeper  Some recent data might be hidden    Patient Care Team: Kermit Baloeed, Tiffany L, DO as PCP - General (Geriatric Medicine) Luciana Axeankin, Alford HighlandGary A, MD as Consulting Physician (Ophthalmology) Donzetta StarchJones, Drew, MD as Consulting Physician (Dermatology) Hart CarwinBrodie, Dora M, MD (Inactive) as Consulting Physician (Gastroenterology)  Indicate any recent Medical Services you may have received from other than [facility] providers in the past year (date may be approximate).     Assessment:   This is a routine wellness examination for [name].  Hearing/Vision screen  Hearing Screening   125Hz  250Hz  500Hz  1000Hz  2000Hz  3000Hz  4000Hz  6000Hz  8000Hz   Right ear:           Left ear:           Comments: Some hearing concerns.   Vision Screening Comments: Some vision concerns. Patient wears prescription glasses. Patient states she gets shots in her eyes for Macular Degeneration.  Dietary issues and exercise activities discussed: Current Exercise Habits: Home exercise routine, Type of exercise: stretching;strength training/weights;walking, Time (Minutes): 10, Frequency (Times/Week): 7, Weekly Exercise (Minutes/Week): 70, Intensity: Mild, Exercise limited by: Other - see comments (arthritis)  Goals    . <enter goal here>     Starting 10/05/16, I will maintain my current lifestyle; I will also attempt to increase my walking daily.       Depression  Screen PHQ 2/9 Scores 02/04/2020 12/02/2019 11/18/2019 10/29/2019 09/02/2019 02/01/2019 12/24/2018  PHQ - 2 Score 0 0 0 0 0 0 0    Fall Risk Fall Risk  02/04/2020 12/02/2019 11/18/2019 10/29/2019 09/02/2019  Falls in the past year? 1 - 0 1 0  Number falls in past yr: 0 0 0 1 0  Comment - - - - -  Injury with Fall? 0 0 0 1 0  Comment - - - - -  Risk Factor Category  - - - - -  Risk for fall due to : - - - - -  Follow up - - - - -    Any stairs in or around the home? No  If so, are there any without handrails? No  Home free of loose throw rugs in walkways, pet beds, electrical cords, etc? No  Adequate lighting in your home to reduce risk of falls? Yes   ASSISTIVE DEVICES UTILIZED TO PREVENT FALLS:  Life alert? No  Use of a cane, walker or w/c? Yes  Grab bars in the bathroom? Yes  Shower chair or bench in shower? Yes  Elevated toilet seat or a handicapped toilet? Yes   TIMED UP AND GO:  Was the test performed? No .  Length of time to ambulate 10 feet:  sec. Had Telephone visit   Cognitive Function: MMSE - Mini Mental State Exam 12/27/2017 10/05/2016 10/28/2014  Not completed: (No Data) - -  Orientation to time 5 5 5   Orientation to Place 5 5 5   Registration 3 3 3   Attention/ Calculation 5 5 5   Recall 2 3 2   Language- name 2 objects 2 2 2   Language- repeat 1 1 1   Language- follow 3 step command 3 3 3   Language- read & follow direction 1 1 1   Write a sentence 1 1 1   Copy design 0 0 1  Total score 28 29 29      6CIT Screen 02/04/2020 02/01/2019  What Year? 0 points 0 points  What month? 0 points 0 points  What time? 0 points 0 points  Count back from 20 4 points 2 points  Months in reverse 0 points 0 points  Repeat phrase 4 points 2 points  Total Score 8 4    Immunizations Immunization History  Administered Date(s) Administered  . Fluad Quad(high Dose 65+) 04/08/2019  . Influenza, High Dose Seasonal PF 05/04/2017, 05/10/2018  . Influenza,inj,Quad PF,6+ Mos 05/27/2013,  04/30/2014, 05/18/2015, 05/16/2016  . Influenza-Unspecified 05/04/2011  . Moderna SARS-COVID-2 Vaccination 09/05/2019, 10/03/2019  . PPD Test 06/20/2017  . Pneumococcal Conjugate-13 08/11/2015  . Pneumococcal-Unspecified 08/16/1999  . Td 12/07/2005  . Tdap 06/15/2017  . Zoster 08/14/2015    TDAP status: Up to date Flu Vaccine status: Up to date Pneumococcal vaccine status: Up to date Covid-19 vaccine status: Completed vaccines  Qualifies for Shingles Vaccine? Yes   Zostavax completed Yes   Shingrix Completed?: No.    Education has been provided regarding the importance of this vaccine. Patient has been advised to call insurance company to determine out of pocket expense if they have not yet received this vaccine. Advised may also receive vaccine at local pharmacy or Health Dept. Verbalized acceptance and understanding.  Screening Tests Health Maintenance  Topic Date Due  . INFLUENZA VACCINE  03/15/2020  . TETANUS/TDAP  06/16/2027  . DEXA SCAN  Completed  . COVID-19 Vaccine  Completed  . PNA vac Low Risk Adult  Completed    Health Maintenance  There are no preventive care reminders to display for this patient.  Colorectal cancer screening: No longer required.  Mammogram status: No longer required.  Bone Density status: Completed 05/02/2007. Results reflect: Bone density results: OSTEOPENIA. Repeat every declined years.  Lung Cancer Screening: (Low Dose CT Chest recommended if Age 34-80 years, 30 pack-year currently smoking OR have quit w/in 15years.) does not qualify.   Lung Cancer Screening Referral: N/a   Additional Screening:  Hepatitis C Screening: does not qualify;  Completed : Low Risk   Vision Screening: Recommended annual ophthalmology exams for early detection of glaucoma and other disorders of the eye. Is the patient up to date with their annual eye exam?  Yes  Who is the provider or what is the name of the office in which the patient attends annual eye exams?  01/20/2020  If pt is not established with a provider, would they like to be referred to a provider to establish care? No follows up with Dr.Rankin.    Dental Screening: Recommended annual dental exams for proper oral hygiene  Community Resource Referral / Chronic Care Management: CRR required this visit?  No   CCM required this visit?  No     Plan:    - due for Shingrix vaccine  I have personally reviewed and noted the following in the patient's chart:   . Medical and social history . Use of alcohol, tobacco or illicit drugs  . Current medications and supplements . Functional ability and status . Nutritional status . Physical activity . Advanced directives . List of other physicians . Hospitalizations, surgeries, and ER visits in previous 12 months . Vitals . Screenings to include cognitive, depression, and falls . Referrals and appointments  In addition, I have reviewed and discussed with patient certain preventive protocols, quality metrics, and best practice recommendations. A written personalized care plan for preventive services as well as general preventive health recommendations were provided to patient.    Caesar Bookman, NP   02/04/2020   Nurse Notes: Advised to get shingrix vaccine at her pharmacy.

## 2020-02-04 NOTE — Patient Instructions (Signed)
[name] , Thank you for taking time to come for your Medicare Wellness Visit. I appreciate your ongoing commitment to your health goals. Please review the following plan we discussed and let me know if I can assist you in the future.   Screening recommendations/referrals: Colonoscopy: Aged out  Mammogram: Aged out  Bone Density: N/a  Recommended yearly ophthalmology/optometry visit for glaucoma screening and checkup Recommended yearly dental visit for hygiene and checkup  Vaccinations: Influenza vaccine: Up to date  Pneumococcal vaccine : Up to date  Tdap vaccine : Up to date due 07/02/2027  Shingles vaccine: please get your shingrix vaccine at your pharmacy.   Advanced directives: Yes   Conditions/risks identified:Advance age female > 49 yrs,Hypertension,Family hx of Cardiovascular disease   Next appointment: 1 year   Preventive Care 53 Years and Older, Female Preventive care refers to lifestyle choices and visits with your health care provider that can promote health and wellness. What does preventive care include?  A yearly physical exam. This is also called an annual well check.  Dental exams once or twice a year.  Routine eye exams. Ask your health care provider how often you should have your eyes checked.  Personal lifestyle choices, including:  Daily care of your teeth and gums.  Regular physical activity.  Eating a healthy diet.  Avoiding tobacco and drug use.  Limiting alcohol use.  Practicing safe sex.  Taking low-dose aspirin every day.  Taking vitamin and mineral supplements as recommended by your health care provider. What happens during an annual well check? The services and screenings done by your health care provider during your annual well check will depend on your age, overall health, lifestyle risk factors, and family history of disease. Counseling  Your health care provider may ask you questions about your:  Alcohol use.  Tobacco  use.  Drug use.  Emotional well-being.  Home and relationship well-being.  Sexual activity.  Eating habits.  History of falls.  Memory and ability to understand (cognition).  Work and work Astronomer.  Reproductive health. Screening  You may have the following tests or measurements:  Height, weight, and BMI.  Blood pressure.  Lipid and cholesterol levels. These may be checked every 5 years, or more frequently if you are over 72 years old.  Skin check.  Lung cancer screening. You may have this screening every year starting at age 93 if you have a 30-pack-year history of smoking and currently smoke or have quit within the past 15 years.  Fecal occult blood test (FOBT) of the stool. You may have this test every year starting at age 87.  Flexible sigmoidoscopy or colonoscopy. You may have a sigmoidoscopy every 5 years or a colonoscopy every 10 years starting at age 46.  Hepatitis C blood test.  Hepatitis B blood test.  Sexually transmitted disease (STD) testing.  Diabetes screening. This is done by checking your blood sugar (glucose) after you have not eaten for a while (fasting). You may have this done every 1-3 years.  Bone density scan. This is done to screen for osteoporosis. You may have this done starting at age 60.  Mammogram. This may be done every 1-2 years. Talk to your health care provider about how often you should have regular mammograms. Talk with your health care provider about your test results, treatment options, and if necessary, the need for more tests. Vaccines  Your health care provider may recommend certain vaccines, such as:  Influenza vaccine. This is recommended every year.  Tetanus, diphtheria, and acellular pertussis (Tdap, Td) vaccine. You may need a Td booster every 10 years.  Zoster vaccine. You may need this after age 52.  Pneumococcal 13-valent conjugate (PCV13) vaccine. One dose is recommended after age 26.  Pneumococcal  polysaccharide (PPSV23) vaccine. One dose is recommended after age 84. Talk to your health care provider about which screenings and vaccines you need and how often you need them. This information is not intended to replace advice given to you by your health care provider. Make sure you discuss any questions you have with your health care provider. Document Released: 08/28/2015 Document Revised: 04/20/2016 Document Reviewed: 06/02/2015 Elsevier Interactive Patient Education  2017 [place] Prevention in the Home Falls can cause injuries. They can happen to people of all ages. There are many things you can do to make your home safe and to help prevent falls. What can I do on the outside of my home?  Regularly fix the edges of walkways and driveways and fix any cracks.  Remove anything that might make you trip as you walk through a door, such as a raised step or threshold.  Trim any bushes or trees on the path to your home.  Use bright outdoor lighting.  Clear any walking paths of anything that might make someone trip, such as rocks or tools.  Regularly check to see if handrails are loose or broken. Make sure that both sides of any steps have handrails.  Any raised decks and porches should have guardrails on the edges.  Have any leaves, snow, or ice cleared regularly.  Use sand or salt on walking paths during winter.  Clean up any spills in your garage right away. This includes oil or grease spills. What can I do in the bathroom?  Use night lights.  Install grab bars by the toilet and in the tub and shower. Do not use towel bars as grab bars.  Use non-skid mats or decals in the tub or shower.  If you need to sit down in the shower, use a plastic, non-slip stool.  Keep the floor dry. Clean up any water that spills on the floor as soon as it happens.  Remove soap buildup in the tub or shower regularly.  Attach bath mats securely with double-sided non-slip rug  tape.  Do not have throw rugs and other things on the floor that can make you trip. What can I do in the bedroom?  Use night lights.  Make sure that you have a light by your bed that is easy to reach.  Do not use any sheets or blankets that are too big for your bed. They should not hang down onto the floor.  Have a firm chair that has side arms. You can use this for support while you get dressed.  Do not have throw rugs and other things on the floor that can make you trip. What can I do in the kitchen?  Clean up any spills right away.  Avoid walking on wet floors.  Keep items that you use a lot in easy-to-reach places.  If you need to reach something above you, use a strong step stool that has a grab bar.  Keep electrical cords out of the way.  Do not use floor polish or wax that makes floors slippery. If you must use wax, use non-skid floor wax.  Do not have throw rugs and other things on the floor that can make you trip. What can I do  with my stairs?  Do not leave any items on the stairs.  Make sure that there are handrails on both sides of the stairs and use them. Fix handrails that are broken or loose. Make sure that handrails are as long as the stairways.  Check any carpeting to make sure that it is firmly attached to the stairs. Fix any carpet that is loose or worn.  Avoid having throw rugs at the top or bottom of the stairs. If you do have throw rugs, attach them to the floor with carpet tape.  Make sure that you have a light switch at the top of the stairs and the bottom of the stairs. If you do not have them, ask someone to add them for you. What else can I do to help prevent falls?  Wear shoes that:  Do not have high heels.  Have rubber bottoms.  Are comfortable and fit you well.  Are closed at the toe. Do not wear sandals.  If you use a stepladder:  Make sure that it is fully opened. Do not climb a closed stepladder.  Make sure that both sides of the  stepladder are locked into place.  Ask someone to hold it for you, if possible.  Clearly mark and make sure that you can see:  Any grab bars or handrails.  First and last steps.  Where the edge of each step is.  Use tools that help you move around (mobility aids) if they are needed. These include:  Canes.  Walkers.  Scooters.  Crutches.  Turn on the lights when you go into a dark area. Replace any light bulbs as soon as they burn out.  Set up your furniture so you have a clear path. Avoid moving your furniture around.  If any of your floors are uneven, fix them.  If there are any pets around you, be aware of where they are.  Review your medicines with your doctor. Some medicines can make you feel dizzy. This can increase your chance of falling. Ask your doctor what other things that you can do to help prevent falls. This information is not intended to replace advice given to you by your health care provider. Make sure you discuss any questions you have with your health care provider. Document Released: 05/28/2009 Document Revised: 01/07/2016 Document Reviewed: 09/05/2014 Elsevier Interactive Patient Education  2017 [employer].

## 2020-02-04 NOTE — Progress Notes (Signed)
    This service is provided via telemedicine  No vital signs collected/recorded due to the encounter was a telemedicine visit.   Location of patient (ex: home, work): Home.  Patient consents to a telephone visit: Yes.  Location of the provider (ex: office, home):  [facility].  Name of any referring provider: N/A  Names of all persons participating in the telemedicine service and their role in the encounter:  Patient, Adora Yeh, RMA, Ngetich, Dinah, NP.    Time spent on call: 8 minutes spent on the phone with Medical Assistant.   

## 2020-02-05 ENCOUNTER — Telehealth: Payer: Self-pay

## 2020-02-05 ENCOUNTER — Encounter: Payer: Self-pay | Admitting: Cardiology

## 2020-02-05 ENCOUNTER — Ambulatory Visit: Payer: Medicare Other | Admitting: Cardiology

## 2020-02-05 ENCOUNTER — Other Ambulatory Visit: Payer: Self-pay

## 2020-02-05 VITALS — BP 138/80 | HR 79 | Resp 16 | Ht 63.0 in | Wt 116.4 lb

## 2020-02-05 DIAGNOSIS — I10 Essential (primary) hypertension: Secondary | ICD-10-CM

## 2020-02-05 DIAGNOSIS — Z7901 Long term (current) use of anticoagulants: Secondary | ICD-10-CM

## 2020-02-05 DIAGNOSIS — I480 Paroxysmal atrial fibrillation: Secondary | ICD-10-CM

## 2020-02-05 DIAGNOSIS — E039 Hypothyroidism, unspecified: Secondary | ICD-10-CM

## 2020-02-05 MED ORDER — AMLODIPINE BESYLATE 10 MG PO TABS: 10.0000 mg | ORAL_TABLET | Freq: Every evening | ORAL | 1 refills | Status: DC

## 2020-02-05 MED ORDER — METOPROLOL SUCCINATE ER 100 MG PO TB24: 100.0000 mg | ORAL_TABLET | Freq: Every day | ORAL | 1 refills | Status: DC

## 2020-02-05 NOTE — Telephone Encounter (Signed)
Ms. batoul, limes are scheduled for a virtual visit with your provider today.    Just as we do with appointments in the office, we must obtain your consent to participate.  Your consent will be active for this visit and any virtual visit you may have with one of our providers in the next 365 days.    If you have a MyChart account, I can also send a copy of this consent to you electronically.  All virtual visits are billed to your insurance company just like a traditional visit in the office.  As this is a virtual visit, video technology does not allow for your provider to perform a traditional examination.  This may limit your provider's ability to fully assess your condition.  If your provider identifies any concerns that need to be evaluated in person or the need to arrange testing such as labs, EKG, etc, we will make arrangements to do so.    Although advances in technology are sophisticated, we cannot ensure that it will always work on either your end or our end.  If the connection with a video visit is poor, we may have to switch to a telephone visit.  With either a video or telephone visit, we are not always able to ensure that we have a secure connection.   I need to obtain your verbal consent now.   Are you willing to proceed with your visit today?   EDA MAGNUSSEN has provided verbal consent on 02/04/2020 for a virtual visit (video or telephone).   Dicky Doe, [place] 02/04/2020  10:30am

## 2020-02-05 NOTE — Progress Notes (Signed)
Date:  02/05/2020   ID:  [name], DOB 1931-03-23, MRN [id number]  PCP:  Kermit Balo, DO  Cardiologist:  Tessa Lerner, DO, [facility] (established care 12/09/2019)  Date: 02/05/20 Last Office Visit: 01/07/2020  REASON FOR CONSULT: Atrial fibrillation and hypertension  Chief Complaint  Patient presents with  . Hypertension  . Follow-up    4 week    HPI  [name] is a 86 y.o. female whose past medical history and cardiac risk factors include: Hypothyroidism, hypertension, paroxysmal atrial fibrillation, postmenopausal female, and advanced age.  Patient presents to the office with a chief complaint of follow-up on blood pressures.  She was originally seen in April 2021 at the request of her primary care provider for evaluation of newly diagnosed atrial fibrillation and hypertension management.  Benign essential hypertension: Patient was referred to the office initially for evaluation and management of hypertension.  Prior to establishing care with our office her systolic blood pressures would be as high as 180-190 mmHg.  Her medications have been uptitrated slowly and enrolled into principal care management.  As result her blood pressures have come down very nicely.  Blood pressure for the last 1 month have ranged between 128-141 mmHg.  No longer is on doxazosin and has weaned herself off of it since last office visit.  She is tolerated the initiation of amlodipine and hydrochlorothiazide well without any side effects or intolerances.  Most recent lab work reviewed with her at today's visit.  Atrial fibrillation: Patient remains asymptomatic.  She is doing well on metoprolol and Eliquis.  Does not endorse any evidence of bleeding.  Patient's ventricular rate remains controlled. Reiterated the risks, benefits, and alternatives to oral anticoagulation at today's office visit.  No recent falls or imbalance with gait.  She does use a walker for ambulation.  She had underwent an  echocardiogram and nuclear stress test given the new diagnosis of atrial fibrillation.  Patient nuclear stress test was reported to be intermediate risk.  She continues to be asymptomatic.  I would like to continue medical therapy as opposed to invasive procedures.  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  FUNCTIONAL STATUS: Able to do her ADLs and walks on a daily routine.    ALLERGIES: Allergies  Allergen Reactions  . Ambien [Zolpidem Tartrate]     hallucination  . Bactrim [Sulfamethoxazole-Trimethoprim]     Unknown reaction   . Morphine And Related Nausea And Vomiting  . Zithromax [Azithromycin]     Not effective      MEDICATION LIST PRIOR TO VISIT: Current Meds  Medication Sig  . acetaminophen (TYLENOL) 500 MG tablet Take 500 mg by mouth in the morning and at bedtime.   . ALPRAZolam (XANAX) 0.5 MG tablet TAKE 1 TABLET BY MOUTH THREE TIMES A DAY AS NEEDED FOR ANXIETY  . apixaban (ELIQUIS) 2.5 MG TABS tablet Take 1 tablet (2.5 mg total) by mouth 2 (two) times daily.  . hydrochlorothiazide (MICROZIDE) 12.5 MG capsule Take 1 capsule (12.5 mg total) by mouth in the morning.  . irbesartan (AVAPRO) 150 MG tablet Take 1 tablet (150 mg total) by mouth daily.  Marland Kitchen KLOR-CON M20 20 MEQ tablet TAKE 1 TABLET BY MOUTH EVERY DAY  . levothyroxine (SYNTHROID) 100 MCG tablet TAKE 1 TABLET BY MOUTH EVERY DAY BEFORE BREAKFAST  . loratadine (CLARITIN) 10 MG tablet Take 10 mg by mouth daily.   . pantoprazole (PROTONIX) 40 MG tablet TAKE 1  TABLET BY MOUTH EVERY DAY  . polyethylene glycol (MIRALAX / GLYCOLAX) packet Take 17 g by mouth daily.   Marland Kitchen Propylene Glycol (SYSTANE BALANCE) 0.6 % SOLN Place 1 drop into both eyes 2 (two) times daily.  . sertraline (ZOLOFT) 50 MG tablet TAKE 1 TABLET BY MOUTH EVERY DAY TO CALM NERVES.  Marland Kitchen temazepam (RESTORIL) 30 MG capsule TAKE 1 CAPSULE BY MOUTH AT BEDTIME AS NEEDED FOR SLEEP  . [DISCONTINUED] amLODipine (NORVASC) 10 MG tablet  Take 1 tablet (10 mg total) by mouth every evening.  . [DISCONTINUED] metoprolol succinate (TOPROL XL) 100 MG 24 hr tablet Take 1 tablet (100 mg total) by mouth daily for 90 doses. Hold if systolic blood pressure (top blood pressure number) less than 100 mmHg or heart rate less than 60 bpm (pulse).  Marland Kitchen amLODipine (NORVASC) 10 MG tablet Take 1 tablet (10 mg total) by mouth every evening.  . metoprolol succinate (TOPROL XL) 100 MG 24 hr tablet Take 1 tablet (100 mg total) by mouth daily. Hold if systolic blood pressure (top blood pressure number) less than 100 mmHg or heart rate less than 60 bpm (pulse).     PAST MEDICAL HISTORY: Past Medical History:  Diagnosis Date  . Allergic rhinitis due to pollen   . Anxiety state, unspecified   . Calculus of kidney   . Cataract   . Diaphragmatic hernia without mention of obstruction or gangrene   . Diffuse cystic mastopathy   . Dizziness and giddiness   . GERD (gastroesophageal reflux disease)   . Hiatal hernia   . Insomnia, unspecified   . Insomnia, unspecified   . Lumbago   . Macular degeneration (senile) of retina, unspecified   . Open wound of toe(s), without mention of complication   . Osteoarthrosis, unspecified whether generalized or localized, unspecified site   . Other and unspecified hyperlipidemia   . Personal history of fall   . Sciatica   . Sebaceous cyst   . Senile osteoporosis   . Unspecified constipation   . Unspecified essential hypertension   . Unspecified hereditary and idiopathic peripheral neuropathy   . Unspecified hypothyroidism   . Unspecified tinnitus     PAST SURGICAL HISTORY: Past Surgical History:  Procedure Laterality Date  . ABDOMINAL HYSTERECTOMY    . CATARACT EXTRACTION, BILATERAL    . CHOLECYSTECTOMY  07/25/2007  . TONSILLECTOMY AND ADENOIDECTOMY Bilateral 1943    FAMILY HISTORY: The patient family history includes Fibromyalgia in her daughter; Hyperlipidemia in her brother; Hypertension in her  brother, brother, and son.  SOCIAL HISTORY:  The patient  reports that she has never smoked. She has never used smokeless tobacco. She reports that she does not drink alcohol and does not use drugs.  REVIEW OF SYSTEMS: Review of Systems  Constitutional: Negative for chills and fever.  HENT: Negative for hoarse voice and nosebleeds.   Eyes: Negative for discharge, double vision and pain.  Cardiovascular: Negative for chest pain, claudication, dyspnea on exertion, leg swelling, near-syncope, orthopnea, palpitations, paroxysmal nocturnal dyspnea and syncope.  Respiratory: Negative for hemoptysis and shortness of breath.   Musculoskeletal: Negative for muscle cramps and myalgias.  Gastrointestinal: Negative for abdominal pain, constipation, diarrhea, hematemesis, hematochezia, melena, nausea and vomiting.  Neurological: Negative for dizziness and light-headedness.    PHYSICAL EXAM: Vitals with BMI 02/05/2020 02/05/2020 01/07/2020  Height - 5\' 3"  5\' 3"   Weight - 116 lbs 6 oz 116 lbs  BMI - 32.67 12.45  Systolic 809 983 382  Diastolic 80 88 75  Pulse 79 75 57    CONSTITUTIONAL: Well-developed and well-nourished. No acute distress.  SKIN: Skin is warm and dry. No rash noted. No cyanosis. No pallor. No jaundice HEAD: Normocephalic and atraumatic.  EYES: No scleral icterus MOUTH/THROAT: Moist oral membranes.  NECK: No JVD present. No thyromegaly noted.  LYMPHATIC: No visible cervical adenopathy.  CHEST Normal respiratory effort. No intercostal retractions  LUNGS: Clear to auscultation bilaterally.  No stridor. No wheezes. No rales.  CARDIOVASCULAR: Regular rate and rhythm, positive S1 and S2, soft systolic murmur heard at the left sternal border, no gallops or rubs. ABDOMINAL: No apparent ascites.  EXTREMITIES: No peripheral edema, warm to touch bilaterally HEMATOLOGIC: No significant bruising NEUROLOGIC: Oriented to person, place, and time. Nonfocal. Normal muscle tone.  PSYCHIATRIC:  Normal mood and affect. Normal behavior. Cooperative  CARDIAC DATABASE: EKG: 12/09/2019: Normal sinus rhythm, 70 bpm, normal axis, poor R wave progression, LVH per voltage criteria, cannot rule out old anterior infarct, without underlying injury pattern.  Echocardiogram: 12/20/2019:  Normal LV systolic function with visual EF 55-60%. Left ventricle cavity is normal in size. Mild concentric hypertrophy of the left ventricle.  Normal global wall motion. Doppler evidence of grade II (pseudonormal) diastolic dysfunction, elevated LAP. Calculated EF 50%.  Patient was in sinus rhythm during exam.  Left atrial cavity is severely dilated with LA voume index of 70 mL/m2.  Structurally normal mitral valve. Mild (Grade I) mitral regurgitation.  Structurally normal tricuspid valve. Mild tricuspid regurgitation. No evidence of pulmonary hypertension. RVSP measures 30 mmHg.  Stress Testing: 12/30/2019: Small sized, mild intensity, partially reversible perfusion defect in basal inferoseptal myocardium. Normal myocardial perfusion. All segments of left ventricle demonstrated normal wall motion and thickening. Stress LVEF calculated 42%, although visually appears normal.  Heart Catheterization: None  LABORATORY DATA: CBC Latest Ref Rng & Units 11/17/2019 12/05/2018 07/23/2018  WBC 4.0 - 10.5 K/uL 4.8 4.1 3.7(L)  Hemoglobin 12.0 - 15.0 g/dL 29.514.1 28.413.3 13.212.9  Hematocrit 36 - 46 % 42.9 38.9 39.1  Platelets 150 - 400 K/uL 117(L) 141 143    CMP Latest Ref Rng & Units 01/14/2020 11/17/2019 08/28/2019  Glucose 65 - 99 mg/dL 440(N137(H) 027(O131(H) 97  BUN 8 - 27 mg/dL 22 12 17   Creatinine 0.57 - 1.00 mg/dL 5.360.91 6.440.72 0.340.71  Sodium 134 - 144 mmol/L 136 129(L) 130(L)  Potassium 3.5 - 5.2 mmol/L 4.3 4.2 3.6  Chloride 96 - 106 mmol/L 92(L) 87(L) 87(L)  CO2 20 - 29 mmol/L 31(H) 31 37(H)  Calcium 8.7 - 10.3 mg/dL 9.9 9.3 9.6  Total Protein 6.1 - 8.1 g/dL - - -  Total Bilirubin 0.2 - 1.2 mg/dL - - -  Alkaline Phos 38 - 126 U/L -  - -  AST 10 - 35 U/L - - -  ALT 6 - 29 U/L - - -    Lipid Panel     Component Value Date/Time   CHOL 175 03/05/2018 1017   CHOL 131 07/30/2015 1053   TRIG 147 03/05/2018 1017   HDL 59 03/05/2018 1017   HDL 59 07/30/2015 1053   CHOLHDL 3.0 03/05/2018 1017   VLDL 24 10/05/2016 0957   LDLCALC 92 03/05/2018 1017   LABVLDL 15 07/30/2015 1053    Lab Results  Component Value Date   HGBA1C  07/23/2007    5.7 (NOTE)   The ADA recommends the following therapeutic goals for glycemic   control related to Hgb A1C measurement:   Goal of Therapy:   < 7.0% Hgb A1C  Action Suggested:  > 8.0% Hgb A1C   Ref:  Diabetes Care, 22, Suppl. 1, 1999   No components found for: NTPROBNP Lab Results  Component Value Date   TSH 3.002 11/17/2019   TSH 4.73 (H) 12/05/2018   TSH 6.71 (H) 07/23/2018   IMPRESSION:    ICD-10-CM   1. Essential hypertension  I10 amLODipine (NORVASC) 10 MG tablet  2. Paroxysmal atrial fibrillation (HCC)  I48.0 metoprolol succinate (TOPROL XL) 100 MG 24 hr tablet  3. Long term (current) use of anticoagulants  Z79.01   4. Hypothyroidism, adult  E03.9      RECOMMENDATIONS: SHAUNTAVIA BRACKIN is a 86 y.o. female whose past medical history and cardiac risk factors include: Hypothyroidism, hypertension, paroxysmal atrial fibrillation, postmenopausal female, and advanced age.  Paroxysmal atrial fibrillation: Currently normal sinus rhythm.  Newly diagnosed April 2021.  Rate control: Toprol-XL, refilled.  Rhythm control: N/A   Thromboembolic prophylaxis: Eliquis   CHA2DS2-VASc SCORE is 4 which correlates to 4 % risk of stroke per year.  Long-term oral anticoagulation:   Indication paroxysmal atrial fibrillation.    Patient does not endorse any evidence of bleeding. No prior history of intracranial bleed or internal bleeding.    Patient educated on the risks, benefits, and alternatives to oral anticoagulation she verbalizes understanding.  Benign essential  hypertension:  Medications reviewed.  Blood pressure is currently not at goal, but improving.  Refill amlodipine to 10 mg p.o. every afternoon.  Continue hydrochlorothiazide 12.5 mg p.o. every morning.   Continue with ambulatory blood pressure monitoring.  We will recheck kidney function, electrolytes, and hemoglobin prior to the next office visit.  FINAL MEDICATION LIST END OF ENCOUNTER: Meds ordered this encounter  Medications  . amLODipine (NORVASC) 10 MG tablet    Sig: Take 1 tablet (10 mg total) by mouth every evening.    Dispense:  90 tablet    Refill:  1  . metoprolol succinate (TOPROL XL) 100 MG 24 hr tablet    Sig: Take 1 tablet (100 mg total) by mouth daily. Hold if systolic blood pressure (top blood pressure number) less than 100 mmHg or heart rate less than 60 bpm (pulse).    Dispense:  90 tablet    Refill:  1    Medications Discontinued During This Encounter  Medication Reason  . metoprolol succinate (TOPROL XL) 100 MG 24 hr tablet Reorder  . amLODipine (NORVASC) 10 MG tablet Reorder     Current Outpatient Medications:  .  acetaminophen (TYLENOL) 500 MG tablet, Take 500 mg by mouth in the morning and at bedtime. , Disp: , Rfl:  .  ALPRAZolam (XANAX) 0.5 MG tablet, TAKE 1 TABLET BY MOUTH THREE TIMES A DAY AS NEEDED FOR ANXIETY, Disp: 90 tablet, Rfl: 5 .  apixaban (ELIQUIS) 2.5 MG TABS tablet, Take 1 tablet (2.5 mg total) by mouth 2 (two) times daily., Disp: 60 tablet, Rfl: 3 .  hydrochlorothiazide (MICROZIDE) 12.5 MG capsule, Take 1 capsule (12.5 mg total) by mouth in the morning., Disp: 90 capsule, Rfl: 0 .  irbesartan (AVAPRO) 150 MG tablet, Take 1 tablet (150 mg total) by mouth daily., Disp: 90 tablet, Rfl: 3 .  KLOR-CON M20 20 MEQ tablet, TAKE 1 TABLET BY MOUTH EVERY DAY, Disp: 90 tablet, Rfl: 1 .  levothyroxine (SYNTHROID) 100 MCG tablet, TAKE 1 TABLET BY MOUTH EVERY DAY BEFORE BREAKFAST, Disp: 90 tablet, Rfl: 3 .  loratadine (CLARITIN) 10 MG tablet, Take 10  mg by mouth daily. , Disp: , Rfl:  .  pantoprazole (PROTONIX) 40 MG tablet, TAKE 1 TABLET BY MOUTH EVERY DAY, Disp: 90 tablet, Rfl: 1 .  polyethylene glycol (MIRALAX / GLYCOLAX) packet, Take 17 g by mouth daily. , Disp: , Rfl:  .  Propylene Glycol (SYSTANE BALANCE) 0.6 % SOLN, Place 1 drop into both eyes 2 (two) times daily., Disp: , Rfl:  .  sertraline (ZOLOFT) 50 MG tablet, TAKE 1 TABLET BY MOUTH EVERY DAY TO CALM NERVES., Disp: 90 tablet, Rfl: 1 .  temazepam (RESTORIL) 30 MG capsule, TAKE 1 CAPSULE BY MOUTH AT BEDTIME AS NEEDED FOR SLEEP, Disp: 30 capsule, Rfl: 5 .  amLODipine (NORVASC) 10 MG tablet, Take 1 tablet (10 mg total) by mouth every evening., Disp: 90 tablet, Rfl: 1 .  metoprolol succinate (TOPROL XL) 100 MG 24 hr tablet, Take 1 tablet (100 mg total) by mouth daily. Hold if systolic blood pressure (top blood pressure number) less than 100 mmHg or heart rate less than 60 bpm (pulse)., Disp: 90 tablet, Rfl: 1  No orders of the defined types were placed in this encounter.  There are no Patient Instructions on file for this visit. --Continue cardiac medications as reconciled in final medication list. --Return in about 3 months (around 05/07/2020) for afib follow up., BP follow up.. Or sooner if needed. --Continue follow-up with your primary care physician regarding the management of your other chronic comorbid conditions.  Patient's questions and concerns were addressed to her satisfaction. She voices understanding of the instructions provided during this encounter.   This note was created using a voice recognition software as a result there may be grammatical errors inadvertently enclosed that do not reflect the nature of this encounter. Every attempt is made to correct such errors.  Tessa Lerner, [place], [facility]  Pager: [phone] Office: [phone]

## 2020-02-06 ENCOUNTER — Other Ambulatory Visit: Payer: Self-pay

## 2020-02-06 ENCOUNTER — Other Ambulatory Visit: Payer: Self-pay | Admitting: Pharmacist

## 2020-02-06 DIAGNOSIS — R600 Localized edema: Secondary | ICD-10-CM

## 2020-02-06 DIAGNOSIS — I480 Paroxysmal atrial fibrillation: Secondary | ICD-10-CM

## 2020-02-06 DIAGNOSIS — I10 Essential (primary) hypertension: Secondary | ICD-10-CM

## 2020-02-06 MED ORDER — METOPROLOL SUCCINATE ER 100 MG PO TB24: 100.0000 mg | ORAL_TABLET | Freq: Every day | ORAL | 1 refills | Status: AC

## 2020-02-06 MED ORDER — AMLODIPINE BESYLATE 10 MG PO TABS: 10.0000 mg | ORAL_TABLET | Freq: Every evening | ORAL | 1 refills | Status: AC

## 2020-02-12 DIAGNOSIS — I10 Essential (primary) hypertension: Secondary | ICD-10-CM | POA: Diagnosis not present

## 2020-02-15 ENCOUNTER — Other Ambulatory Visit: Payer: Self-pay | Admitting: Family

## 2020-02-15 DIAGNOSIS — I10 Essential (primary) hypertension: Secondary | ICD-10-CM

## 2020-02-15 DIAGNOSIS — I480 Paroxysmal atrial fibrillation: Secondary | ICD-10-CM

## 2020-02-18 NOTE — Telephone Encounter (Signed)
Patient is requesting refill on "Metoprolol". Patient had this medication discontinued on 12/02/2019 and upcoming appointment on 04/02/2020. Medication pend and sent to pcp Reed, Tiffany L, DO. Please Advise.

## 2020-02-25 ENCOUNTER — Encounter (INDEPENDENT_AMBULATORY_CARE_PROVIDER_SITE_OTHER): Payer: Self-pay | Admitting: Ophthalmology

## 2020-02-25 ENCOUNTER — Other Ambulatory Visit: Payer: Self-pay

## 2020-02-25 ENCOUNTER — Ambulatory Visit (INDEPENDENT_AMBULATORY_CARE_PROVIDER_SITE_OTHER): Payer: Medicare Other | Admitting: Ophthalmology

## 2020-02-25 DIAGNOSIS — H353211 Exudative age-related macular degeneration, right eye, with active choroidal neovascularization: Secondary | ICD-10-CM

## 2020-02-25 MED ORDER — BEVACIZUMAB CHEMO INJECTION 1.25MG/0.05ML SYRINGE FOR KALEIDOSCOPE
1.2500 mg | INTRAVITREAL | Status: AC | PRN
  Administered 2020-02-25: 1.25 mg via INTRAVITREAL

## 2020-02-25 NOTE — Assessment & Plan Note (Signed)
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

## 2020-02-25 NOTE — Progress Notes (Signed)
02/25/2020     CHIEF COMPLAINT Patient presents for Retina Follow Up   HISTORY OF PRESENT ILLNESS: [name] is a 86 y.o. female who presents to the clinic today for:   HPI    Retina Follow Up    Patient presents with  Wet AMD.  In right eye.  Duration of 5 weeks.  Since onset it is stable.          Comments    5 week follow up - OCT OU, Poss Avastin OD Patient denies change in vision and overall has no complaints.        Last edited by Berenice Bouton on 02/25/2020 10:48 AM. (History)      Referring physician: Kermit Balo, DO [address] [place],  [place] [address]  HISTORICAL INFORMATION:   Selected notes from the MEDICAL RECORD NUMBER    Lab Results  Component Value Date   HGBA1C  07/23/2007    5.7 (NOTE)   The ADA recommends the following therapeutic goals for glycemic   control related to Hgb A1C measurement:   Goal of Therapy:   < 7.0% Hgb A1C   Action Suggested:  > 8.0% Hgb A1C   Ref:  Diabetes Care, 22, Suppl. 1, 1999     CURRENT MEDICATIONS: Current Outpatient Medications (Ophthalmic Drugs)  Medication Sig  . Propylene Glycol (SYSTANE BALANCE) 0.6 % SOLN Place 1 drop into both eyes 2 (two) times daily.   No current facility-administered medications for this visit. (Ophthalmic Drugs)   Current Outpatient Medications (Other)  Medication Sig  . acetaminophen (TYLENOL) 500 MG tablet Take 500 mg by mouth in the morning and at bedtime.   . ALPRAZolam (XANAX) 0.5 MG tablet TAKE 1 TABLET BY MOUTH THREE TIMES A DAY AS NEEDED FOR ANXIETY  . amLODipine (NORVASC) 10 MG tablet Take 1 tablet (10 mg total) by mouth every evening.  Marland Kitchen apixaban (ELIQUIS) 2.5 MG TABS tablet Take 1 tablet (2.5 mg total) by mouth 2 (two) times daily.  . hydrochlorothiazide (MICROZIDE) 12.5 MG capsule Take 1 capsule (12.5 mg total) by mouth in the morning.  . irbesartan (AVAPRO) 150 MG tablet Take 1 tablet (150 mg total) by mouth daily.  Marland Kitchen KLOR-CON M20 20 MEQ tablet TAKE 1 TABLET BY  MOUTH EVERY DAY  . levothyroxine (SYNTHROID) 100 MCG tablet TAKE 1 TABLET BY MOUTH EVERY DAY BEFORE BREAKFAST  . loratadine (CLARITIN) 10 MG tablet Take 10 mg by mouth daily.   . metoprolol succinate (TOPROL XL) 100 MG 24 hr tablet Take 1 tablet (100 mg total) by mouth daily. Hold if systolic blood pressure (top blood pressure number) less than 100 mmHg or heart rate less than 60 bpm (pulse).  . pantoprazole (PROTONIX) 40 MG tablet TAKE 1 TABLET BY MOUTH EVERY DAY  . polyethylene glycol (MIRALAX / GLYCOLAX) packet Take 17 g by mouth daily.   . sertraline (ZOLOFT) 50 MG tablet TAKE 1 TABLET BY MOUTH EVERY DAY TO CALM NERVES.  Marland Kitchen temazepam (RESTORIL) 30 MG capsule TAKE 1 CAPSULE BY MOUTH AT BEDTIME AS NEEDED FOR SLEEP   No current facility-administered medications for this visit. (Other)      REVIEW OF SYSTEMS:    ALLERGIES Allergies  Allergen Reactions  . Ambien [Zolpidem Tartrate]     hallucination  . Bactrim [Sulfamethoxazole-Trimethoprim]     Unknown reaction   . Morphine And Related Nausea And Vomiting  . Zithromax [Azithromycin]     Not effective  PAST MEDICAL HISTORY Past Medical History:  Diagnosis Date  . Allergic rhinitis due to pollen   . Anxiety state, unspecified   . Calculus of kidney   . Cataract   . Diaphragmatic hernia without mention of obstruction or gangrene   . Diffuse cystic mastopathy   . Dizziness and giddiness   . GERD (gastroesophageal reflux disease)   . Hiatal hernia   . Insomnia, unspecified   . Insomnia, unspecified   . Lumbago   . Macular degeneration (senile) of retina, unspecified   . Open wound of toe(s), without mention of complication   . Osteoarthrosis, unspecified whether generalized or localized, unspecified site   . Other and unspecified hyperlipidemia   . Personal history of fall   . Sciatica   . Sebaceous cyst   . Senile osteoporosis   . Unspecified constipation   . Unspecified essential hypertension   . Unspecified  hereditary and idiopathic peripheral neuropathy   . Unspecified hypothyroidism   . Unspecified tinnitus    Past Surgical History:  Procedure Laterality Date  . ABDOMINAL HYSTERECTOMY    . CATARACT EXTRACTION, BILATERAL    . CHOLECYSTECTOMY  07/25/2007  . TONSILLECTOMY AND ADENOIDECTOMY Bilateral 1943    FAMILY HISTORY Family History  Problem Relation Age of Onset  . Hypertension Brother   . Fibromyalgia Daughter   . Hypertension Son   . Hypertension Brother   . Hyperlipidemia Brother   . Breast cancer Neg Hx     SOCIAL HISTORY Social History   Tobacco Use  . Smoking status: Never Smoker  . Smokeless tobacco: Never Used  Vaping Use  . Vaping Use: Never used  Substance Use Topics  . Alcohol use: No    Alcohol/week: 0.0 standard drinks  . Drug use: No         OPHTHALMIC EXAM:  Base Eye Exam    Visual Acuity (Snellen - Linear)      Right Left   Dist cc 20/50+2 20/400   Dist ph cc NI NI   Correction: Glasses       Tonometry (Tonopen, 10:55 AM)      Right Left   Pressure 13 13       Pupils      Pupils Dark Light Shape React APD   Right PERRL 3 2 Round Brisk None   Left PERRL 3 2 Round Brisk None       Visual Fields (Counting fingers)      Left Right     Full   Restrictions Partial outer inferior nasal deficiency        Extraocular Movement      Right Left    Full Full       Neuro/Psych    Oriented x3: Yes   Mood/Affect: Normal       Dilation    Both eyes: 1.0% Mydriacyl, 2.5% Phenylephrine @ 10:55 AM        Slit Lamp and Fundus Exam    External Exam      Right Left   External Normal Normal       Slit Lamp Exam      Right Left   Lids/Lashes Normal Normal   Conjunctiva/Sclera White and quiet White and quiet   Cornea Clear Clear   Anterior Chamber Deep and quiet Deep and quiet   Iris Round and reactive Round and reactive   Lens Posterior chamber intraocular lens Posterior chamber intraocular lens   Anterior Vitreous Normal Normal  Fundus Exam      Right Left   Posterior Vitreous Normal    Disc Normal    C/D Ratio 0.0    Macula Geographic atrophy, Advanced age related macular degeneration, Disciform scar, Macular thickening, Subretinal neovascular membrane, Subretinal hemorrhage smaller temporal to the pannus.    Vessels Normal    Periphery Normal           IMAGING AND PROCEDURES  Imaging and Procedures for 02/25/20  OCT, Retina - OU - Both Eyes       Right Eye Quality was good. Scan locations included subfoveal. Central Foveal Thickness: 255. Progression has been stable. Findings include subretinal hyper-reflective material, central retinal atrophy, outer retinal atrophy, inner retinal atrophy, subretinal scarring.   Left Eye Quality was good. Scan locations included subfoveal. Central Foveal Thickness: 341. Progression has been stable. Findings include choroidal neovascular membrane, cystoid macular edema, central retinal atrophy, outer retinal atrophy, inner retinal atrophy.   Notes OD with active CNVM superior to the fovea, controlled at 5-week exam interval repeat today  OS with active lesion temporal to the fovea, repeat as scheduled or when patient is available       Intravitreal Injection, Pharmacologic Agent - OD - Right Eye       Time Out 02/25/2020. 11:27 AM. Confirmed correct patient, procedure, site, and patient consented.   Anesthesia Topical anesthesia was used. Anesthetic medications included Akten 3.5%.   Procedure Preparation included Tobramycin 0.3%, 10% betadine to eyelids, 5% betadine to ocular surface. A 30 gauge needle was used.   Injection:  1.25 mg Bevacizumab (AVASTIN) SOLN   NDC: [address]-069194-0334-1, Lot: [id number]   Route: Intravitreal, Site: Right Eye, Waste: 0 mg  Post-op Post injection exam found visual acuity of at least counting fingers. The patient tolerated the procedure well. There were no complications. The patient received written and verbal post procedure  care education. Post injection medications were not given.                 ASSESSMENT/PLAN:  Exudative age-related macular degeneration of right eye with active choroidal neovascularization (HCC) OD, controlled lesion superior to the fovea on intravitreal Avastin, repeat today      ICD-10-CM   1. Exudative age-related macular degeneration of right eye with active choroidal neovascularization (HCC)  H35.3211 OCT, Retina - OU - Both Eyes    Intravitreal Injection, Pharmacologic Agent - OD - Right Eye    Bevacizumab (AVASTIN) SOLN 1.25 mg    1.  Repeat intravitreal Avastin OD to control CNVM disease and exam in 5 weeks  2.  OS scheduled for evaluation and possible intravitreal Avastin next week, patient wishes to move it for 2 weeks  3.  Ophthalmic Meds Ordered this visit:  Meds ordered this encounter  Medications  . Bevacizumab (AVASTIN) SOLN 1.25 mg       Return in about 5 weeks (around 03/31/2020) for dilate, OD, AVASTIN OCT.  There are no Patient Instructions on file for this visit.   Explained the diagnoses, plan, and follow up with the patient and they expressed understanding.  Patient expressed understanding of the importance of proper follow up care.   Alford HighlandGary A. Davian Hanshaw M.D. Diseases & Surgery of the Retina and Vitreous [facility] 02/25/20     Abbreviations: M myopia (nearsighted); A astigmatism; H hyperopia (farsighted); P presbyopia; Mrx spectacle prescription;  CTL contact lenses; OD right eye; OS left eye; OU both eyes  XT exotropia; ET esotropia; PEK punctate epithelial keratitis;  PEE punctate epithelial erosions; DES dry eye syndrome; MGD meibomian gland dysfunction; ATs artificial tears; PFAT's preservative free artificial tears; NSC nuclear sclerotic cataract; PSC posterior subcapsular cataract; ERM epi-retinal membrane; PVD posterior vitreous detachment; RD retinal detachment; DM diabetes mellitus; DR diabetic retinopathy; NPDR  non-proliferative diabetic retinopathy; PDR proliferative diabetic retinopathy; CSME clinically significant macular edema; DME diabetic macular edema; dbh dot blot hemorrhages; CWS cotton wool spot; POAG primary open angle glaucoma; C/D cup-to-disc ratio; HVF humphrey visual field; GVF goldmann visual field; OCT optical coherence tomography; IOP intraocular pressure; BRVO Branch retinal vein occlusion; CRVO central retinal vein occlusion; CRAO central retinal artery occlusion; BRAO branch retinal artery occlusion; RT retinal tear; SB scleral buckle; PPV pars plana vitrectomy; VH Vitreous hemorrhage; PRP panretinal laser photocoagulation; IVK intravitreal kenalog; VMT vitreomacular traction; MH Macular hole;  NVD neovascularization of the disc; NVE neovascularization elsewhere; AREDS age related eye disease study; ARMD age related macular degeneration; POAG primary open angle glaucoma; EBMD epithelial/anterior basement membrane dystrophy; ACIOL anterior chamber intraocular lens; IOL intraocular lens; PCIOL posterior chamber intraocular lens; Phaco/IOL phacoemulsification with intraocular lens placement; PRK photorefractive keratectomy; LASIK laser assisted in situ keratomileusis; HTN hypertension; DM diabetes mellitus; COPD chronic obstructive pulmonary disease

## 2020-02-26 ENCOUNTER — Other Ambulatory Visit: Payer: Self-pay | Admitting: Internal Medicine

## 2020-03-04 ENCOUNTER — Encounter (INDEPENDENT_AMBULATORY_CARE_PROVIDER_SITE_OTHER): Payer: Medicare Other | Admitting: Ophthalmology

## 2020-03-05 ENCOUNTER — Other Ambulatory Visit: Payer: Self-pay | Admitting: Cardiology

## 2020-03-05 DIAGNOSIS — I10 Essential (primary) hypertension: Secondary | ICD-10-CM

## 2020-03-11 ENCOUNTER — Encounter (INDEPENDENT_AMBULATORY_CARE_PROVIDER_SITE_OTHER): Payer: Self-pay | Admitting: Ophthalmology

## 2020-03-11 ENCOUNTER — Ambulatory Visit (INDEPENDENT_AMBULATORY_CARE_PROVIDER_SITE_OTHER): Payer: Medicare Other | Admitting: Ophthalmology

## 2020-03-11 ENCOUNTER — Other Ambulatory Visit: Payer: Self-pay

## 2020-03-11 DIAGNOSIS — H353113 Nonexudative age-related macular degeneration, right eye, advanced atrophic without subfoveal involvement: Secondary | ICD-10-CM | POA: Diagnosis not present

## 2020-03-11 DIAGNOSIS — H353124 Nonexudative age-related macular degeneration, left eye, advanced atrophic with subfoveal involvement: Secondary | ICD-10-CM | POA: Diagnosis not present

## 2020-03-11 DIAGNOSIS — H353221 Exudative age-related macular degeneration, left eye, with active choroidal neovascularization: Secondary | ICD-10-CM | POA: Diagnosis not present

## 2020-03-11 MED ORDER — BEVACIZUMAB CHEMO INJECTION 1.25MG/0.05ML SYRINGE FOR KALEIDOSCOPE
1.2500 mg | INTRAVITREAL | Status: AC | PRN
  Administered 2020-03-11: 1.25 mg via INTRAVITREAL

## 2020-03-11 NOTE — Progress Notes (Signed)
03/11/2020     CHIEF COMPLAINT Patient presents for Retina Follow Up   HISTORY OF PRESENT ILLNESS: [name] is a 86 y.o. female who presents to the clinic today for:   HPI    Retina Follow Up    Patient presents with  Wet AMD.  In left eye.  Duration of 7 weeks.  Since onset it is stable.          Comments    7 week follow up - OCT OU, Poss Avastin OS Patient denies change in vision and overall has no complaints.       Last edited by Berenice Bouton on 03/11/2020 10:48 AM. (History)      Referring physician: Kermit Balo, DO [address] [place],  [place] [address]  HISTORICAL INFORMATION:   Selected notes from the MEDICAL RECORD NUMBER    Lab Results  Component Value Date   HGBA1C  07/23/2007    5.7 (NOTE)   The ADA recommends the following therapeutic goals for glycemic   control related to Hgb A1C measurement:   Goal of Therapy:   < 7.0% Hgb A1C   Action Suggested:  > 8.0% Hgb A1C   Ref:  Diabetes Care, 22, Suppl. 1, 1999     CURRENT MEDICATIONS: Current Outpatient Medications (Ophthalmic Drugs)  Medication Sig  . Propylene Glycol (SYSTANE BALANCE) 0.6 % SOLN Place 1 drop into both eyes 2 (two) times daily.   No current facility-administered medications for this visit. (Ophthalmic Drugs)   Current Outpatient Medications (Other)  Medication Sig  . acetaminophen (TYLENOL) 500 MG tablet Take 500 mg by mouth in the morning and at bedtime.   . ALPRAZolam (XANAX) 0.5 MG tablet TAKE 1 TABLET BY MOUTH THREE TIMES A DAY AS NEEDED FOR ANXIETY  . amLODipine (NORVASC) 10 MG tablet Take 1 tablet (10 mg total) by mouth every evening.  Marland Kitchen apixaban (ELIQUIS) 2.5 MG TABS tablet Take 1 tablet (2.5 mg total) by mouth 2 (two) times daily.  . hydrochlorothiazide (MICROZIDE) 12.5 MG capsule Take 1 capsule (12.5 mg total) by mouth in the morning.  . irbesartan (AVAPRO) 150 MG tablet Take 1 tablet (150 mg total) by mouth daily.  Marland Kitchen KLOR-CON M20 20 MEQ tablet TAKE 1 TABLET BY  MOUTH EVERY DAY  . levothyroxine (SYNTHROID) 100 MCG tablet TAKE 1 TABLET BY MOUTH EVERY DAY BEFORE BREAKFAST  . loratadine (CLARITIN) 10 MG tablet Take 10 mg by mouth daily.   . metoprolol succinate (TOPROL XL) 100 MG 24 hr tablet Take 1 tablet (100 mg total) by mouth daily. Hold if systolic blood pressure (top blood pressure number) less than 100 mmHg or heart rate less than 60 bpm (pulse).  . pantoprazole (PROTONIX) 40 MG tablet TAKE 1 TABLET BY MOUTH EVERY DAY  . polyethylene glycol (MIRALAX / GLYCOLAX) packet Take 17 g by mouth daily.   . sertraline (ZOLOFT) 50 MG tablet TAKE 1 TABLET BY MOUTH EVERY DAY TO CALM NERVES.  Marland Kitchen temazepam (RESTORIL) 30 MG capsule TAKE 1 CAPSULE BY MOUTH AT BEDTIME AS NEEDED FOR SLEEP   No current facility-administered medications for this visit. (Other)      REVIEW OF SYSTEMS:    ALLERGIES Allergies  Allergen Reactions  . Ambien [Zolpidem Tartrate]     hallucination  . Bactrim [Sulfamethoxazole-Trimethoprim]     Unknown reaction   . Morphine And Related Nausea And Vomiting  . Zithromax [Azithromycin]     Not effective  PAST MEDICAL HISTORY Past Medical History:  Diagnosis Date  . Allergic rhinitis due to pollen   . Anxiety state, unspecified   . Calculus of kidney   . Cataract   . Diaphragmatic hernia without mention of obstruction or gangrene   . Diffuse cystic mastopathy   . Dizziness and giddiness   . GERD (gastroesophageal reflux disease)   . Hiatal hernia   . Insomnia, unspecified   . Insomnia, unspecified   . Lumbago   . Macular degeneration (senile) of retina, unspecified   . Open wound of toe(s), without mention of complication   . Osteoarthrosis, unspecified whether generalized or localized, unspecified site   . Other and unspecified hyperlipidemia   . Personal history of fall   . Sciatica   . Sebaceous cyst   . Senile osteoporosis   . Unspecified constipation   . Unspecified essential hypertension   . Unspecified  hereditary and idiopathic peripheral neuropathy   . Unspecified hypothyroidism   . Unspecified tinnitus    Past Surgical History:  Procedure Laterality Date  . ABDOMINAL HYSTERECTOMY    . CATARACT EXTRACTION, BILATERAL    . CHOLECYSTECTOMY  07/25/2007  . TONSILLECTOMY AND ADENOIDECTOMY Bilateral 1943    FAMILY HISTORY Family History  Problem Relation Age of Onset  . Hypertension Brother   . Fibromyalgia Daughter   . Hypertension Son   . Hypertension Brother   . Hyperlipidemia Brother   . Breast cancer Neg Hx     SOCIAL HISTORY Social History   Tobacco Use  . Smoking status: Never Smoker  . Smokeless tobacco: Never Used  Vaping Use  . Vaping Use: Never used  Substance Use Topics  . Alcohol use: No    Alcohol/week: 0.0 standard drinks  . Drug use: No         OPHTHALMIC EXAM:  Base Eye Exam    Visual Acuity (Snellen - Linear)      Right Left   Dist cc 20/60-2 CF @ 8'   Dist ph cc NI NI   Correction: Glasses       Tonometry (Tonopen, 10:53 AM)      Right Left   Pressure 13 10       Pupils      Pupils Dark Light Shape React APD   Right PERRL 3 2 Round Brisk None   Left PERRL 3 2 Round Brisk None       Visual Fields (Counting fingers)      Left Right     Full   Restrictions Partial outer inferior nasal deficiency        Extraocular Movement      Right Left    Full Full       Neuro/Psych    Oriented x3: Yes   Mood/Affect: Normal       Dilation    Left eye: 1.0% Mydriacyl, 2.5% Phenylephrine @ 10:53 AM        Slit Lamp and Fundus Exam    External Exam      Right Left   External Normal Normal       Slit Lamp Exam      Right Left   Lids/Lashes Normal Normal   Conjunctiva/Sclera White and quiet White and quiet   Cornea Clear Clear   Anterior Chamber Deep and quiet Deep and quiet   Iris Round and reactive Round and reactive   Lens Posterior chamber intraocular lens Posterior chamber intraocular lens   Anterior Vitreous Normal Normal  Fundus Exam      Right Left   Posterior Vitreous  Normal   Disc  Normal   C/D Ratio  0.3   Macula  Geographic atrophy, Advanced age related macular degeneration, Disciform scar, less  Macular thickening, Subretinal neovascular membrane temporally, Pigmented atrophy   Vessels  Normal   Periphery  Normal          IMAGING AND PROCEDURES  Imaging and Procedures for 03/11/20  OCT, Retina - OU - Both Eyes       Right Eye Quality was good. Scan locations included subfoveal. Central Foveal Thickness: 240. Progression has been stable. Findings include subretinal scarring, central retinal atrophy, outer retinal atrophy.   Left Eye Quality was good. Scan locations included subfoveal. Central Foveal Thickness: 334. Progression has improved. Findings include outer retinal atrophy, central retinal atrophy, cystoid macular edema.   Notes OS, with much less active CNVM centrally And peripherally yet with chronic scarring, currently at 7-week interval.  Large disciform temporally is less active.       Intravitreal Injection, Pharmacologic Agent - OS - Left Eye       Time Out 03/11/2020. 11:20 AM. Confirmed correct patient, procedure, site, and patient consented.   Anesthesia Topical anesthesia was used. Anesthetic medications included Akten 3.5%.   Procedure Preparation included 5% betadine to ocular surface, 10% betadine to eyelids. A 30 gauge needle was used.   Injection:  1.25 mg Bevacizumab (AVASTIN) SOLN   NDC: [address]-7, Lot: [id number]   Route: Intravitreal, Site: Left Eye, Waste: 0 mg  Post-op Post injection exam found visual acuity of at least counting fingers. The patient tolerated the procedure well. There were no complications. The patient received written and verbal post procedure care education. Post injection medications were not given.                 ASSESSMENT/PLAN:  Exudative age-related macular degeneration of left eye with active choroidal  neovascularization (HCC) Much less active disciform scar to the foveal region.  Will retreat today to prevent scotoma enlargement, eye exam OS in 8 weeks      ICD-10-CM   1. Exudative age-related macular degeneration of left eye with active choroidal neovascularization (HCC)  H35.3221 OCT, Retina - OU - Both Eyes    Intravitreal Injection, Pharmacologic Agent - OS - Left Eye    Bevacizumab (AVASTIN) SOLN 1.25 mg  2. Advanced nonexudative age-related macular degeneration of right eye without subfoveal involvement  H35.3113   3. Advanced nonexudative age-related macular degeneration of left eye with subfoveal involvement  H35.3124     1.  Repeat injection intravitreal Avastin OS today to prevent scotoma enlargement, currently at 7-week interval.  We will repeat examination left eye in 8 weeks  2.  Upper OD as scheduled  3.  Ophthalmic Meds Ordered this visit:  Meds ordered this encounter  Medications  . Bevacizumab (AVASTIN) SOLN 1.25 mg       Return in about 8 weeks (around 05/06/2020), or ,,OD as scheduled, for dilate, OS, AVASTIN OCT.  There are no Patient Instructions on file for this visit.   Explained the diagnoses, plan, and follow up with the patient and they expressed understanding.  Patient expressed understanding of the importance of proper follow up care.   Alford Highland Jahlen Bollman M.D. Diseases & Surgery of the Retina and Vitreous [facility] 03/11/20     Abbreviations: M myopia (nearsighted); A astigmatism; H hyperopia (farsighted); P presbyopia; Mrx spectacle prescription;  CTL contact  lenses; OD right eye; OS left eye; OU both eyes  XT exotropia; ET esotropia; PEK punctate epithelial keratitis; PEE punctate epithelial erosions; DES dry eye syndrome; MGD meibomian gland dysfunction; ATs artificial tears; PFAT's preservative free artificial tears; NSC nuclear sclerotic cataract; PSC posterior subcapsular cataract; ERM epi-retinal membrane; PVD posterior  vitreous detachment; RD retinal detachment; DM diabetes mellitus; DR diabetic retinopathy; NPDR non-proliferative diabetic retinopathy; PDR proliferative diabetic retinopathy; CSME clinically significant macular edema; DME diabetic macular edema; dbh dot blot hemorrhages; CWS cotton wool spot; POAG primary open angle glaucoma; C/D cup-to-disc ratio; HVF humphrey visual field; GVF goldmann visual field; OCT optical coherence tomography; IOP intraocular pressure; BRVO Branch retinal vein occlusion; CRVO central retinal vein occlusion; CRAO central retinal artery occlusion; BRAO branch retinal artery occlusion; RT retinal tear; SB scleral buckle; PPV pars plana vitrectomy; VH Vitreous hemorrhage; PRP panretinal laser photocoagulation; IVK intravitreal kenalog; VMT vitreomacular traction; MH Macular hole;  NVD neovascularization of the disc; NVE neovascularization elsewhere; AREDS age related eye disease study; ARMD age related macular degeneration; POAG primary open angle glaucoma; EBMD epithelial/anterior basement membrane dystrophy; ACIOL anterior chamber intraocular lens; IOL intraocular lens; PCIOL posterior chamber intraocular lens; Phaco/IOL phacoemulsification with intraocular lens placement; PRK photorefractive keratectomy; LASIK laser assisted in situ keratomileusis; HTN hypertension; DM diabetes mellitus; COPD chronic obstructive pulmonary disease

## 2020-03-11 NOTE — Assessment & Plan Note (Signed)
Much less active disciform scar to the foveal region.  Will retreat today to prevent scotoma enlargement, eye exam OS in 8 weeks

## 2020-03-12 ENCOUNTER — Other Ambulatory Visit: Payer: Self-pay | Admitting: Cardiology

## 2020-03-12 DIAGNOSIS — I480 Paroxysmal atrial fibrillation: Secondary | ICD-10-CM

## 2020-03-14 DIAGNOSIS — I10 Essential (primary) hypertension: Secondary | ICD-10-CM | POA: Diagnosis not present

## 2020-03-21 ENCOUNTER — Other Ambulatory Visit: Payer: Self-pay | Admitting: Internal Medicine

## 2020-03-30 ENCOUNTER — Encounter (INDEPENDENT_AMBULATORY_CARE_PROVIDER_SITE_OTHER): Payer: Self-pay | Admitting: Ophthalmology

## 2020-03-30 ENCOUNTER — Ambulatory Visit (INDEPENDENT_AMBULATORY_CARE_PROVIDER_SITE_OTHER): Payer: Medicare Other | Admitting: Ophthalmology

## 2020-03-30 ENCOUNTER — Other Ambulatory Visit: Payer: Self-pay

## 2020-03-30 ENCOUNTER — Other Ambulatory Visit: Payer: Self-pay | Admitting: Cardiology

## 2020-03-30 DIAGNOSIS — Z20828 Contact with and (suspected) exposure to other viral communicable diseases: Secondary | ICD-10-CM | POA: Diagnosis not present

## 2020-03-30 DIAGNOSIS — H353211 Exudative age-related macular degeneration, right eye, with active choroidal neovascularization: Secondary | ICD-10-CM

## 2020-03-30 DIAGNOSIS — Z1159 Encounter for screening for other viral diseases: Secondary | ICD-10-CM | POA: Diagnosis not present

## 2020-03-30 DIAGNOSIS — I10 Essential (primary) hypertension: Secondary | ICD-10-CM

## 2020-03-30 MED ORDER — BEVACIZUMAB CHEMO INJECTION 1.25MG/0.05ML SYRINGE FOR KALEIDOSCOPE
1.2500 mg | INTRAVITREAL | Status: AC | PRN
  Administered 2020-03-30: 1.25 mg via INTRAVITREAL

## 2020-03-30 NOTE — Progress Notes (Signed)
03/30/2020     CHIEF COMPLAINT Patient presents for Retina Follow Up   HISTORY OF PRESENT ILLNESS: [name] is a 86 y.o. female who presents to the clinic today for:   HPI    Retina Follow Up    Patient presents with  Wet AMD.  Severity is moderate.  Duration of 5 weeks.  Since onset it is stable.  I, the attending physician,  performed the HPI with the patient and updated documentation appropriately.          Comments    5 Week Wet AMD f\u OD. Possible Avastin OD. OCT  Pt states vision is stable. Denies any complaints       Last edited by Elyse Jarvis on 03/30/2020 10:25 AM. (History)      Referring physician: Kermit Balo, DO [address] [place],  [place] [address]  HISTORICAL INFORMATION:   Selected notes from the MEDICAL RECORD NUMBER    Lab Results  Component Value Date   HGBA1C  07/23/2007    5.7 (NOTE)   The ADA recommends the following therapeutic goals for glycemic   control related to Hgb A1C measurement:   Goal of Therapy:   < 7.0% Hgb A1C   Action Suggested:  > 8.0% Hgb A1C   Ref:  Diabetes Care, 22, Suppl. 1, 1999     CURRENT MEDICATIONS: Current Outpatient Medications (Ophthalmic Drugs)  Medication Sig  . Propylene Glycol (SYSTANE BALANCE) 0.6 % SOLN Place 1 drop into both eyes 2 (two) times daily.   No current facility-administered medications for this visit. (Ophthalmic Drugs)   Current Outpatient Medications (Other)  Medication Sig  . acetaminophen (TYLENOL) 500 MG tablet Take 500 mg by mouth in the morning and at bedtime.   . ALPRAZolam (XANAX) 0.5 MG tablet TAKE 1 TABLET BY MOUTH THREE TIMES A DAY AS NEEDED FOR ANXIETY  . amLODipine (NORVASC) 10 MG tablet Take 1 tablet (10 mg total) by mouth every evening.  Marland Kitchen ELIQUIS 2.5 MG TABS tablet TAKE 1 TABLET BY MOUTH TWICE A DAY  . hydrochlorothiazide (MICROZIDE) 12.5 MG capsule TAKE 1 CAPSULE (12.5 MG TOTAL) BY MOUTH IN THE MORNING.  Marland Kitchen irbesartan (AVAPRO) 150 MG tablet Take 1 tablet (150  mg total) by mouth daily.  Marland Kitchen KLOR-CON M20 20 MEQ tablet TAKE 1 TABLET BY MOUTH EVERY DAY  . levothyroxine (SYNTHROID) 100 MCG tablet TAKE 1 TABLET BY MOUTH EVERY DAY BEFORE BREAKFAST  . loratadine (CLARITIN) 10 MG tablet Take 10 mg by mouth daily.   . metoprolol succinate (TOPROL XL) 100 MG 24 hr tablet Take 1 tablet (100 mg total) by mouth daily. Hold if systolic blood pressure (top blood pressure number) less than 100 mmHg or heart rate less than 60 bpm (pulse).  . pantoprazole (PROTONIX) 40 MG tablet TAKE 1 TABLET BY MOUTH EVERY DAY  . polyethylene glycol (MIRALAX / GLYCOLAX) packet Take 17 g by mouth daily.   . sertraline (ZOLOFT) 50 MG tablet TAKE 1 TABLET BY MOUTH EVERY DAY TO CALM NERVES.  Marland Kitchen temazepam (RESTORIL) 30 MG capsule TAKE 1 CAPSULE BY MOUTH AT BEDTIME AS NEEDED FOR SLEEP   No current facility-administered medications for this visit. (Other)      REVIEW OF SYSTEMS:    ALLERGIES Allergies  Allergen Reactions  . Ambien [Zolpidem Tartrate]     hallucination  . Bactrim [Sulfamethoxazole-Trimethoprim]     Unknown reaction   . Morphine And Related Nausea And Vomiting  . Zithromax [Azithromycin]  Not effective      PAST MEDICAL HISTORY Past Medical History:  Diagnosis Date  . Allergic rhinitis due to pollen   . Anxiety state, unspecified   . Calculus of kidney   . Cataract   . Diaphragmatic hernia without mention of obstruction or gangrene   . Diffuse cystic mastopathy   . Dizziness and giddiness   . GERD (gastroesophageal reflux disease)   . Hiatal hernia   . Insomnia, unspecified   . Insomnia, unspecified   . Lumbago   . Macular degeneration (senile) of retina, unspecified   . Open wound of toe(s), without mention of complication   . Osteoarthrosis, unspecified whether generalized or localized, unspecified site   . Other and unspecified hyperlipidemia   . Personal history of fall   . Sciatica   . Sebaceous cyst   . Senile osteoporosis   . Unspecified  constipation   . Unspecified essential hypertension   . Unspecified hereditary and idiopathic peripheral neuropathy   . Unspecified hypothyroidism   . Unspecified tinnitus    Past Surgical History:  Procedure Laterality Date  . ABDOMINAL HYSTERECTOMY    . CATARACT EXTRACTION, BILATERAL    . CHOLECYSTECTOMY  07/25/2007  . TONSILLECTOMY AND ADENOIDECTOMY Bilateral 1943    FAMILY HISTORY Family History  Problem Relation Age of Onset  . Hypertension Brother   . Fibromyalgia Daughter   . Hypertension Son   . Hypertension Brother   . Hyperlipidemia Brother   . Breast cancer Neg Hx     SOCIAL HISTORY Social History   Tobacco Use  . Smoking status: Never Smoker  . Smokeless tobacco: Never Used  Vaping Use  . Vaping Use: Never used  Substance Use Topics  . Alcohol use: No    Alcohol/week: 0.0 standard drinks  . Drug use: No         OPHTHALMIC EXAM:  Base Eye Exam    Visual Acuity (Snellen - Linear)      Right Left   Dist cc 20/60 -2 CF @ 3'   Dist ph cc NI NI   Correction: Glasses       Tonometry (Tonopen, 10:29 AM)      Right Left   Pressure 12 13       Pupils      Pupils Dark Light Shape React APD   Right PERRL 3 2 Round Brisk None   Left PERRL 3 2 Round Brisk None       Visual Fields (Counting fingers)      Left Right     Full   Restrictions Partial outer superior nasal, inferior nasal deficiencies        Neuro/Psych    Oriented x3: Yes   Mood/Affect: Normal       Dilation    Right eye: 1.0% Mydriacyl, 2.5% Phenylephrine @ 10:30 AM        Slit Lamp and Fundus Exam    External Exam      Right Left   External Normal Normal       Slit Lamp Exam      Right Left   Lids/Lashes Normal Normal   Conjunctiva/Sclera White and quiet White and quiet   Cornea Clear Clear   Anterior Chamber Deep and quiet Deep and quiet   Iris Round and reactive Round and reactive   Lens Posterior chamber intraocular lens Posterior chamber intraocular lens    Anterior Vitreous Normal Normal       Fundus Exam  Right Left   Posterior Vitreous Posterior vitreous detachment    Disc Normal    C/D Ratio 0.0    Macula Geographic atrophy, Advanced age related macular degeneration, Disciform scar, Macular thickening, Subretinal neovascular membrane, Subretinal hemorrhage smaller temporal to the pannus.    Vessels Normal    Periphery Normal           IMAGING AND PROCEDURES  Imaging and Procedures for 03/30/20  OCT, Retina - OU - Both Eyes       Right Eye Quality was good. Scan locations included subfoveal. Central Foveal Thickness: 251. Progression has improved. Findings include central retinal atrophy, outer retinal atrophy, abnormal foveal contour, retinal drusen .   Left Eye Quality was good. Scan locations included subfoveal. Central Foveal Thickness: 325. Findings include abnormal foveal contour.   Notes OD, much less perimacular large CNVM on 5-week examination post Avastin, repeat today and examination in 6 weeks.  Outer retinal atrophy accounts for acuity       Intravitreal Injection, Pharmacologic Agent - OD - Right Eye       Time Out 03/30/2020. 11:23 AM. Confirmed correct patient, procedure, site, and patient consented.   Anesthesia Topical anesthesia was used.   Procedure Preparation included Ofloxacin , 10% betadine to eyelids, 5% betadine to ocular surface. A supplied needle was used.   Injection:  1.25 mg Bevacizumab (AVASTIN) SOLN   NDC: [address]-5, Lot: [id number]   Route: Intravitreal, Site: Right Eye, Waste: 0 mg  Post-op Post injection exam found visual acuity of at least counting fingers. The patient tolerated the procedure well. There were no complications. Post injection medications were not given.                 ASSESSMENT/PLAN:  Exudative age-related macular degeneration of right eye with active choroidal neovascularization (HCC) At 5-week examination, perimacular CNVM less active, repeat  intravitreal Avastin today to prevent scotoma growth.  Examination repeat in 6 weeks OD.      ICD-10-CM   1. Exudative age-related macular degeneration of right eye with active choroidal neovascularization (HCC)  H35.3211 OCT, Retina - OU - Both Eyes    Intravitreal Injection, Pharmacologic Agent - OD - Right Eye    Bevacizumab (AVASTIN) SOLN 1.25 mg    1.  Repeat injection intravitreal Avastin OD today, examination repeat OD in 6 weeks  2.  Follow-up examination OS as scheduled  3.  Ophthalmic Meds Ordered this visit:  Meds ordered this encounter  Medications  . Bevacizumab (AVASTIN) SOLN 1.25 mg       Return in about 6 weeks (around 05/11/2020) for dilate, OD, AVASTIN OCT.  There are no Patient Instructions on file for this visit.   Explained the diagnoses, plan, and follow up with the patient and they expressed understanding.  Patient expressed understanding of the importance of proper follow up care.   Alford Highland Kathleen Tamm M.D. Diseases & Surgery of the Retina and Vitreous [facility] 03/30/20     Abbreviations: M myopia (nearsighted); A astigmatism; H hyperopia (farsighted); P presbyopia; Mrx spectacle prescription;  CTL contact lenses; OD right eye; OS left eye; OU both eyes  XT exotropia; ET esotropia; PEK punctate epithelial keratitis; PEE punctate epithelial erosions; DES dry eye syndrome; MGD meibomian gland dysfunction; ATs artificial tears; PFAT's preservative free artificial tears; NSC nuclear sclerotic cataract; PSC posterior subcapsular cataract; ERM epi-retinal membrane; PVD posterior vitreous detachment; RD retinal detachment; DM diabetes mellitus; DR diabetic retinopathy; NPDR non-proliferative diabetic retinopathy; PDR proliferative diabetic  retinopathy; CSME clinically significant macular edema; DME diabetic macular edema; dbh dot blot hemorrhages; CWS cotton wool spot; POAG primary open angle glaucoma; C/D cup-to-disc ratio; HVF humphrey visual  field; GVF goldmann visual field; OCT optical coherence tomography; IOP intraocular pressure; BRVO Branch retinal vein occlusion; CRVO central retinal vein occlusion; CRAO central retinal artery occlusion; BRAO branch retinal artery occlusion; RT retinal tear; SB scleral buckle; PPV pars plana vitrectomy; VH Vitreous hemorrhage; PRP panretinal laser photocoagulation; IVK intravitreal kenalog; VMT vitreomacular traction; MH Macular hole;  NVD neovascularization of the disc; NVE neovascularization elsewhere; AREDS age related eye disease study; ARMD age related macular degeneration; POAG primary open angle glaucoma; EBMD epithelial/anterior basement membrane dystrophy; ACIOL anterior chamber intraocular lens; IOL intraocular lens; PCIOL posterior chamber intraocular lens; Phaco/IOL phacoemulsification with intraocular lens placement; PRK photorefractive keratectomy; LASIK laser assisted in situ keratomileusis; HTN hypertension; DM diabetes mellitus; COPD chronic obstructive pulmonary disease

## 2020-03-30 NOTE — Assessment & Plan Note (Signed)
At 5-week examination, perimacular CNVM less active, repeat intravitreal Avastin today to prevent scotoma growth.  Examination repeat in 6 weeks OD.

## 2020-03-31 ENCOUNTER — Other Ambulatory Visit: Payer: Self-pay | Admitting: *Deleted

## 2020-03-31 DIAGNOSIS — F411 Generalized anxiety disorder: Secondary | ICD-10-CM

## 2020-03-31 DIAGNOSIS — G4700 Insomnia, unspecified: Secondary | ICD-10-CM

## 2020-03-31 MED ORDER — SERTRALINE HCL 50 MG PO TABS: ORAL_TABLET | ORAL | 1 refills | Status: AC

## 2020-04-02 ENCOUNTER — Ambulatory Visit (INDEPENDENT_AMBULATORY_CARE_PROVIDER_SITE_OTHER): Payer: Medicare Other | Admitting: Internal Medicine

## 2020-04-02 ENCOUNTER — Other Ambulatory Visit: Payer: Self-pay

## 2020-04-02 ENCOUNTER — Encounter: Payer: Self-pay | Admitting: Internal Medicine

## 2020-04-02 VITALS — BP 140/68 | HR 77 | Temp 97.5°F | Ht 68.0 in | Wt 113.0 lb

## 2020-04-02 DIAGNOSIS — R2681 Unsteadiness on feet: Secondary | ICD-10-CM

## 2020-04-02 DIAGNOSIS — I480 Paroxysmal atrial fibrillation: Secondary | ICD-10-CM | POA: Diagnosis not present

## 2020-04-02 DIAGNOSIS — G4700 Insomnia, unspecified: Secondary | ICD-10-CM | POA: Diagnosis not present

## 2020-04-02 DIAGNOSIS — I10 Essential (primary) hypertension: Secondary | ICD-10-CM

## 2020-04-02 DIAGNOSIS — R636 Underweight: Secondary | ICD-10-CM | POA: Diagnosis not present

## 2020-04-02 DIAGNOSIS — H3530 Unspecified macular degeneration: Secondary | ICD-10-CM | POA: Diagnosis not present

## 2020-04-02 DIAGNOSIS — M159 Polyosteoarthritis, unspecified: Secondary | ICD-10-CM

## 2020-04-02 DIAGNOSIS — Z681 Body mass index (BMI) 19 or less, adult: Secondary | ICD-10-CM

## 2020-04-02 NOTE — Progress Notes (Signed)
Location:  [facility] clinic Provider:  Keerstin Bjelland L. Renato Gails, D.O., C.M.D.  Code Status: DNR Goals of Care:  Advanced Directives 04/02/2020  Does Patient Have a Medical Advance Directive? Yes  Type of Advance Directive Healthcare Power of Attorney  Does patient want to make changes to medical advance directive? No - Patient declined  Copy of Healthcare Power of Attorney in Chart? -  Would patient like information on creating a medical advance directive? -  Pre-existing out of facility DNR order (yellow form or pink MOST form) -     Chief Complaint  Patient presents with  . Medical Management of Chronic Issues    4 month follow up  . Health Maintenance    Influenza - High dose has not come in yet.     HPI: Patient is a 86 y.o. female seen today for medical management of chronic diseases.    BPs are improving and readings transmit to Dr. Odis Hollingshead.  Taking before her am meds and again in the afternoon.  Pharmacist is monitoring.  Goal is 140/80.  She sees Dr. Odis Hollingshead again this week.  Diastolic is still a little high.  Often goes down.    HR now runs 70s and 80s usually now.    Vision is worse.  Getting shots in both eyes now with avastin.  She's also not moving as well.  Takes 4 walks per day.  Has lost a few lbs again.  Knows she needs to eat more breakfast but seems to like breakfast meats she's avoiding.  Eats well if she goes out with her children or to her daughter's--cleans her plate, she says.  Skipped ensure a few times also lately.  Suggested she be faithful with that.    Has breakfast, ensure and crackers, then supper.    Past Medical History:  Diagnosis Date  . Allergic rhinitis due to pollen   . Anxiety state, unspecified   . Calculus of kidney   . Cataract   . Diaphragmatic hernia without mention of obstruction or gangrene   . Diffuse cystic mastopathy   . Dizziness and giddiness   . GERD (gastroesophageal reflux disease)   . Hiatal hernia   . Insomnia, unspecified   .  Insomnia, unspecified   . Lumbago   . Macular degeneration (senile) of retina, unspecified   . Open wound of toe(s), without mention of complication   . Osteoarthrosis, unspecified whether generalized or localized, unspecified site   . Other and unspecified hyperlipidemia   . Personal history of fall   . Sciatica   . Sebaceous cyst   . Senile osteoporosis   . Unspecified constipation   . Unspecified essential hypertension   . Unspecified hereditary and idiopathic peripheral neuropathy   . Unspecified hypothyroidism   . Unspecified tinnitus     Past Surgical History:  Procedure Laterality Date  . ABDOMINAL HYSTERECTOMY    . CATARACT EXTRACTION, BILATERAL    . CHOLECYSTECTOMY  07/25/2007  . TONSILLECTOMY AND ADENOIDECTOMY Bilateral 1943    Allergies  Allergen Reactions  . Ambien [Zolpidem Tartrate]     hallucination  . Bactrim [Sulfamethoxazole-Trimethoprim]     Unknown reaction   . Morphine And Related Nausea And Vomiting  . Zithromax [Azithromycin]     Not effective      Outpatient Encounter Medications as of 04/02/2020  Medication Sig  . acetaminophen (TYLENOL) 500 MG tablet Take 500 mg by mouth in the morning and at bedtime.   . ALPRAZolam (XANAX) 0.5 MG tablet TAKE  1 TABLET BY MOUTH THREE TIMES A DAY AS NEEDED FOR ANXIETY  . amLODipine (NORVASC) 10 MG tablet Take 1 tablet (10 mg total) by mouth every evening.  Marland Kitchen ELIQUIS 2.5 MG TABS tablet TAKE 1 TABLET BY MOUTH TWICE A DAY  . hydrochlorothiazide (MICROZIDE) 12.5 MG capsule TAKE 1 CAPSULE (12.5 MG TOTAL) BY MOUTH IN THE MORNING.  Marland Kitchen irbesartan (AVAPRO) 150 MG tablet Take 1 tablet (150 mg total) by mouth daily.  Marland Kitchen KLOR-CON M20 20 MEQ tablet TAKE 1 TABLET BY MOUTH EVERY DAY  . levothyroxine (SYNTHROID) 100 MCG tablet TAKE 1 TABLET BY MOUTH EVERY DAY BEFORE BREAKFAST  . loratadine (CLARITIN) 10 MG tablet Take 10 mg by mouth daily.   . metoprolol succinate (TOPROL XL) 100 MG 24 hr tablet Take 1 tablet (100 mg total) by  mouth daily. Hold if systolic blood pressure (top blood pressure number) less than 100 mmHg or heart rate less than 60 bpm (pulse).  . pantoprazole (PROTONIX) 40 MG tablet TAKE 1 TABLET BY MOUTH EVERY DAY  . polyethylene glycol (MIRALAX / GLYCOLAX) packet Take 17 g by mouth daily.   Marland Kitchen Propylene Glycol (SYSTANE BALANCE) 0.6 % SOLN Place 1 drop into both eyes 2 (two) times daily.  . sertraline (ZOLOFT) 50 MG tablet Take one tablet by mouth once daily to calm nerves.  . temazepam (RESTORIL) 30 MG capsule TAKE 1 CAPSULE BY MOUTH AT BEDTIME AS NEEDED FOR SLEEP   No facility-administered encounter medications on file as of 04/02/2020.    Review of Systems:  Review of Systems  Constitutional: Positive for malaise/fatigue and weight loss. Negative for chills and fever.  HENT: Positive for hearing loss. Negative for congestion and sore throat.   Eyes: Positive for blurred vision.  Respiratory: Negative for cough and shortness of breath.   Cardiovascular: Positive for leg swelling. Negative for chest pain and palpitations.       Edema unchanged, wearing compression hose  Gastrointestinal: Negative for abdominal pain, blood in stool and melena.  Genitourinary: Positive for frequency. Negative for dysuria.  Musculoskeletal: Positive for joint pain. Negative for falls.  Skin: Negative for itching and rash.  Neurological: Positive for weakness. Negative for dizziness and loss of consciousness.       Unsteady gait, has just had PT recently  Endo/Heme/Allergies: Bruises/bleeds easily.  Psychiatric/Behavioral: Positive for memory loss. Negative for depression. The patient is nervous/anxious and has insomnia.        Does not seem to be remembering quite as well recently    Health Maintenance  Topic Date Due  . INFLUENZA VACCINE  07/03/2020 (Originally 03/15/2020)  . TETANUS/TDAP  06/16/2027  . DEXA SCAN  Completed  . COVID-19 Vaccine  Completed  . PNA vac Low Risk Adult  Completed    Physical  Exam: Vitals:   04/02/20 1051  BP: 140/68  Pulse: (!) 104  Temp: (!) 97.5 F (36.4 C)  TempSrc: Temporal  SpO2: (!) 78%  Weight: 113 lb (51.3 kg)  Height: 5\' 8"  (1.727 m)   Body mass index is 17.18 kg/m. Physical Exam Constitutional:      General: She is not in acute distress.    Appearance: She is not toxic-appearing.     Comments: Increasingly frail, walks with walker  HENT:     Head: Normocephalic and atraumatic.  Eyes:     Conjunctiva/sclera: Conjunctivae normal.     Pupils: Pupils are equal, round, and reactive to light.     Comments: Vision poor--eye contact no longer  good  Cardiovascular:     Heart sounds: No murmur heard.      Comments: Irregularly irregular Pulmonary:     Effort: Pulmonary effort is normal.     Breath sounds: Normal breath sounds. No wheezing, rhonchi or rales.  Abdominal:     General: Bowel sounds are normal.  Musculoskeletal:        General: Normal range of motion.     Cervical back: Neck supple.     Right lower leg: Edema present.     Left lower leg: Edema present.     Comments: Nonpitting edema of both lower legs and feet, wearing compression stockings  Skin:    General: Skin is warm and dry.  Neurological:     General: No focal deficit present.     Mental Status: She is alert and oriented to person, place, and time.     Motor: Weakness present.  Psychiatric:        Mood and Affect: Mood normal.     Comments: Slightly tremulous     Labs reviewed: Basic Metabolic Panel: Recent Labs    08/28/19 0957 11/17/19 1224 01/14/20 1134 01/14/20 1135  NA 130* 129* 136  --   K 3.6 4.2 4.3  --   CL 87* 87* 92*  --   CO2 37* 31 31*  --   GLUCOSE 97 131* 137*  --   BUN 17 12 22   --   CREATININE 0.71 0.72 0.91  --   CALCIUM 9.6 9.3 9.9  --   MG  --  1.7  --  2.0  TSH  --  3.002  --   --    Liver Function Tests: No results for input(s): AST, ALT, ALKPHOS, BILITOT, PROT, ALBUMIN in the last 8760 hours. No results for input(s):  LIPASE, AMYLASE in the last 8760 hours. No results for input(s): AMMONIA in the last 8760 hours. CBC: Recent Labs    11/17/19 1224  WBC 4.8  NEUTROABS 3.8  HGB 14.1  HCT 42.9  MCV 88.6  PLT 117*   Lipid Panel: No results for input(s): CHOL, HDL, LDLCALC, TRIG, CHOLHDL, LDLDIRECT in the last 8760 hours. Lab Results  Component Value Date   HGBA1C  07/23/2007    5.7 (NOTE)   The ADA recommends the following therapeutic goals for glycemic   control related to Hgb A1C measurement:   Goal of Therapy:   < 7.0% Hgb A1C   Action Suggested:  > 8.0% Hgb A1C   Ref:  Diabetes Care, 22, Suppl. 1, 1999    Procedures since last visit: Intravitreal Injection, Pharmacologic Agent - OD - Right Eye  Result Date: 03/30/2020 Time Out 03/30/2020. 11:23 AM. Confirmed correct patient, procedure, site, and patient consented. Anesthesia Topical anesthesia was used. Procedure Preparation included Ofloxacin , 10% betadine to eyelids, 5% betadine to ocular surface. A supplied needle was used. Injection: 1.25 mg Bevacizumab (AVASTIN) SOLN   NDC: 04/01/2020, Lot: [id number]   Route: Intravitreal, Site: Right Eye, Waste: 0 mg Post-op Post injection exam found visual acuity of at least counting fingers. The patient tolerated the procedure well. There were no complications. Post injection medications were not given.   Intravitreal Injection, Pharmacologic Agent - OS - Left Eye  Result Date: 03/11/2020 Time Out 03/11/2020. 11:20 AM. Confirmed correct patient, procedure, site, and patient consented. Anesthesia Topical anesthesia was used. Anesthetic medications included Akten 3.5%. Procedure Preparation included 5% betadine to ocular surface, 10% betadine to eyelids. A 30 gauge needle was used.  Injection: 1.25 mg Bevacizumab (AVASTIN) SOLN   NDC: [address]-469194-0334-1, Lot: [id number]   Route: Intravitreal, Site: Left Eye, Waste: 0 mg Post-op Post injection exam found visual acuity of at least counting fingers. The patient tolerated the  procedure well. There were no complications. The patient received written and verbal post procedure care education. Post injection medications were not given.   OCT, Retina - OU - Both Eyes  Result Date: 03/30/2020 Right Eye Quality was good. Scan locations included subfoveal. Central Foveal Thickness: 251. Progression has improved. Findings include central retinal atrophy, outer retinal atrophy, abnormal foveal contour, retinal drusen . Left Eye Quality was good. Scan locations included subfoveal. Central Foveal Thickness: 325. Findings include abnormal foveal contour. Notes OD, much less perimacular large CNVM on 5-week examination post Avastin, repeat today and examination in 6 weeks.  Outer retinal atrophy accounts for acuity  OCT, Retina - OU - Both Eyes  Result Date: 03/11/2020 Right Eye Quality was good. Scan locations included subfoveal. Central Foveal Thickness: 240. Progression has been stable. Findings include subretinal scarring, central retinal atrophy, outer retinal atrophy. Left Eye Quality was good. Scan locations included subfoveal. Central Foveal Thickness: 334. Progression has improved. Findings include outer retinal atrophy, central retinal atrophy, cystoid macular edema. Notes OS, with much less active CNVM centrally And peripherally yet with chronic scarring, currently at 7-week interval.  Large disciform temporally is less active.   Assessment/Plan 1. Essential hypertension -bp at goal majority of time per her log -results are getting sent to [facility] now  2. Paroxysmal atrial fibrillation (HCC) -she's not aware she's in afib -has been weaker and more unsteady though ever since  3. Macular degeneration (senile) of retina -now getting injections in both eyes -says left central vision gone and ophtho trying to preserve the periphery and right still trying to keep some central vision -is definitely having more challenges seeing that I can detect just over the  past few months (not as neatly groomed and eye contact not happening even closer up)  4. Generalized OA -ongoing, cont tylenol, topicals,heat  5. Unsteady gait -cont use of walker at all times  6. Insomnia, unspecified type -I've never been able to wean her from her benzos though I have tried  7. Underweight -has lost weight again due to not sticking with regular supplement shakes and missing a mid-day meal -she's going to get back to those items and eat more and drink more water--knows she feels better when she does those  8. Body mass index (BMI) of 19 or less in adult -due to inadequate intake -encouraged her to get back on track with supplements and higher intake with her breakfast as she'd been doing  Labs/tests ordered:   Lab Orders  No laboratory test(s) ordered today  NONE--seeing cardio in b/w--if labs are not done recently before next visit, I will get some same day  Next appt:  08/03/2020 med mgt, NO LABS ahead and does not have to be fasting for the same day labs if they're needed  Kendrick Haapala L. Jochebed Bills, D.O. Geriatrics [facility] [address] [place] [place], [place] [id number] Cell Phone (Mon-Fri 8am-5pm):  [phone] On Call:  [phone] & follow prompts after 5pm & weekends Office Phone:  [phone] Office Fax:  [phone]

## 2020-04-06 DIAGNOSIS — Z20828 Contact with and (suspected) exposure to other viral communicable diseases: Secondary | ICD-10-CM | POA: Diagnosis not present

## 2020-04-06 DIAGNOSIS — Z1159 Encounter for screening for other viral diseases: Secondary | ICD-10-CM | POA: Diagnosis not present

## 2020-04-13 DIAGNOSIS — Z20828 Contact with and (suspected) exposure to other viral communicable diseases: Secondary | ICD-10-CM | POA: Diagnosis not present

## 2020-04-13 DIAGNOSIS — Z1159 Encounter for screening for other viral diseases: Secondary | ICD-10-CM | POA: Diagnosis not present

## 2020-04-14 DIAGNOSIS — I10 Essential (primary) hypertension: Secondary | ICD-10-CM | POA: Diagnosis not present

## 2020-04-20 DIAGNOSIS — Z20828 Contact with and (suspected) exposure to other viral communicable diseases: Secondary | ICD-10-CM | POA: Diagnosis not present

## 2020-04-20 DIAGNOSIS — Z1159 Encounter for screening for other viral diseases: Secondary | ICD-10-CM | POA: Diagnosis not present

## 2020-04-27 DIAGNOSIS — Z1159 Encounter for screening for other viral diseases: Secondary | ICD-10-CM | POA: Diagnosis not present

## 2020-04-27 DIAGNOSIS — Z20828 Contact with and (suspected) exposure to other viral communicable diseases: Secondary | ICD-10-CM | POA: Diagnosis not present

## 2020-05-04 DIAGNOSIS — Z1159 Encounter for screening for other viral diseases: Secondary | ICD-10-CM | POA: Diagnosis not present

## 2020-05-04 DIAGNOSIS — Z20828 Contact with and (suspected) exposure to other viral communicable diseases: Secondary | ICD-10-CM | POA: Diagnosis not present

## 2020-05-06 ENCOUNTER — Encounter (INDEPENDENT_AMBULATORY_CARE_PROVIDER_SITE_OTHER): Payer: Self-pay | Admitting: Ophthalmology

## 2020-05-06 ENCOUNTER — Other Ambulatory Visit: Payer: Self-pay

## 2020-05-06 ENCOUNTER — Ambulatory Visit (INDEPENDENT_AMBULATORY_CARE_PROVIDER_SITE_OTHER): Payer: Medicare Other | Admitting: Ophthalmology

## 2020-05-06 DIAGNOSIS — H353221 Exudative age-related macular degeneration, left eye, with active choroidal neovascularization: Secondary | ICD-10-CM | POA: Diagnosis not present

## 2020-05-06 DIAGNOSIS — H353211 Exudative age-related macular degeneration, right eye, with active choroidal neovascularization: Secondary | ICD-10-CM | POA: Diagnosis not present

## 2020-05-06 MED ORDER — BEVACIZUMAB CHEMO INJECTION 1.25MG/0.05ML SYRINGE FOR KALEIDOSCOPE
1.2500 mg | INTRAVITREAL | Status: AC | PRN
  Administered 2020-05-06: 1.25 mg via INTRAVITREAL

## 2020-05-06 NOTE — Assessment & Plan Note (Signed)
OS large disciform scar temporal to the foveal region with intraretinal fluid and CME, will at 8 weeks, repeat injection OS today to prevent and scotoma enlargement

## 2020-05-06 NOTE — Progress Notes (Signed)
05/06/2020     CHIEF COMPLAINT Patient presents for Retina Follow Up   HISTORY OF PRESENT ILLNESS: [name] is a 86 y.o. female who presents to the clinic today for:   HPI    Retina Follow Up    Patient presents with  Wet AMD.  In right eye.  Severity is moderate.  Duration of 8 weeks.  I, the attending physician,  performed the HPI with the patient and updated documentation appropriately.          Comments    8 Week Wet AMD f\u OS. Possible Avastin OS. OCT  Pt states vision is stable. Pt denies any complaints.       Last edited by Elyse Jarvislayton, Kriston M on 05/06/2020 10:43 AM. (History)      Referring physician: Kermit Baloeed, Tiffany L, DO [address] [place] [id number]  HISTORICAL INFORMATION:   Selected notes from the MEDICAL RECORD NUMBER    Lab Results  Component Value Date   HGBA1C  07/23/2007    5.7 (NOTE)   The ADA recommends the following therapeutic goals for glycemic   control related to Hgb A1C measurement:   Goal of Therapy:   < 7.0% Hgb A1C   Action Suggested:  > 8.0% Hgb A1C   Ref:  Diabetes Care, 22, Suppl. 1, 1999     CURRENT MEDICATIONS: Current Outpatient Medications (Ophthalmic Drugs)  Medication Sig  . Propylene Glycol (SYSTANE BALANCE) 0.6 % SOLN Place 1 drop into both eyes 2 (two) times daily.   No current facility-administered medications for this visit. (Ophthalmic Drugs)   Current Outpatient Medications (Other)  Medication Sig  . acetaminophen (TYLENOL) 500 MG tablet Take 500 mg by mouth in the morning and at bedtime.   . ALPRAZolam (XANAX) 0.5 MG tablet TAKE 1 TABLET BY MOUTH THREE TIMES A DAY AS NEEDED FOR ANXIETY  . amLODipine (NORVASC) 10 MG tablet Take 1 tablet (10 mg total) by mouth every evening.  Marland Kitchen. ELIQUIS 2.5 MG TABS tablet TAKE 1 TABLET BY MOUTH TWICE A DAY  . hydrochlorothiazide (MICROZIDE) 12.5 MG capsule TAKE 1 CAPSULE (12.5 MG TOTAL) BY MOUTH IN THE MORNING.  Marland Kitchen. irbesartan (AVAPRO) 150 MG tablet Take 1 tablet (150 mg  total) by mouth daily.  Marland Kitchen. KLOR-CON M20 20 MEQ tablet TAKE 1 TABLET BY MOUTH EVERY DAY  . levothyroxine (SYNTHROID) 100 MCG tablet TAKE 1 TABLET BY MOUTH EVERY DAY BEFORE BREAKFAST  . loratadine (CLARITIN) 10 MG tablet Take 10 mg by mouth daily.   . metoprolol succinate (TOPROL XL) 100 MG 24 hr tablet Take 1 tablet (100 mg total) by mouth daily. Hold if systolic blood pressure (top blood pressure number) less than 100 mmHg or heart rate less than 60 bpm (pulse).  . pantoprazole (PROTONIX) 40 MG tablet TAKE 1 TABLET BY MOUTH EVERY DAY  . polyethylene glycol (MIRALAX / GLYCOLAX) packet Take 17 g by mouth daily.   . sertraline (ZOLOFT) 50 MG tablet Take one tablet by mouth once daily to calm nerves.  . temazepam (RESTORIL) 30 MG capsule TAKE 1 CAPSULE BY MOUTH AT BEDTIME AS NEEDED FOR SLEEP   No current facility-administered medications for this visit. (Other)      REVIEW OF SYSTEMS:    ALLERGIES Allergies  Allergen Reactions  . Ambien [Zolpidem Tartrate]     hallucination  . Bactrim [Sulfamethoxazole-Trimethoprim]     Unknown reaction   . Morphine And Related Nausea And Vomiting  . Zithromax [Azithromycin]  Not effective      PAST MEDICAL HISTORY Past Medical History:  Diagnosis Date  . Allergic rhinitis due to pollen   . Anxiety state, unspecified   . Calculus of kidney   . Cataract   . Diaphragmatic hernia without mention of obstruction or gangrene   . Diffuse cystic mastopathy   . Dizziness and giddiness   . GERD (gastroesophageal reflux disease)   . Hiatal hernia   . Insomnia, unspecified   . Insomnia, unspecified   . Lumbago   . Macular degeneration (senile) of retina, unspecified   . Open wound of toe(s), without mention of complication   . Osteoarthrosis, unspecified whether generalized or localized, unspecified site   . Other and unspecified hyperlipidemia   . Personal history of fall   . Sciatica   . Sebaceous cyst   . Senile osteoporosis   . Unspecified  constipation   . Unspecified essential hypertension   . Unspecified hereditary and idiopathic peripheral neuropathy   . Unspecified hypothyroidism   . Unspecified tinnitus    Past Surgical History:  Procedure Laterality Date  . ABDOMINAL HYSTERECTOMY    . CATARACT EXTRACTION, BILATERAL    . CHOLECYSTECTOMY  07/25/2007  . TONSILLECTOMY AND ADENOIDECTOMY Bilateral 1943    FAMILY HISTORY Family History  Problem Relation Age of Onset  . Hypertension Brother   . Fibromyalgia Daughter   . Hypertension Son   . Hypertension Brother   . Hyperlipidemia Brother   . Breast cancer Neg Hx     SOCIAL HISTORY Social History   Tobacco Use  . Smoking status: Never Smoker  . Smokeless tobacco: Never Used  Vaping Use  . Vaping Use: Never used  Substance Use Topics  . Alcohol use: No    Alcohol/week: 0.0 standard drinks  . Drug use: No         OPHTHALMIC EXAM:  Base Eye Exam    Visual Acuity (Snellen - Linear)      Right Left   Dist cc 20/60 -2 CF @ 3'   Dist ph cc NI NI   Correction: Glasses       Tonometry (Tonopen, 10:47 AM)      Right Left   Pressure 12 10       Pupils      Pupils Dark Light Shape React APD   Right PERRL 3 2 Round Brisk None   Left PERRL 3 2 Round Brisk None       Visual Fields (Counting fingers)      Left Right     Full   Restrictions Partial outer superior nasal deficiency        Neuro/Psych    Oriented x3: Yes   Mood/Affect: Normal       Dilation    Left eye: 1.0% Mydriacyl, 2.5% Phenylephrine @ 10:47 AM        Slit Lamp and Fundus Exam    External Exam      Right Left   External Normal Normal       Slit Lamp Exam      Right Left   Lids/Lashes Normal Normal   Conjunctiva/Sclera White and quiet White and quiet   Cornea Clear Clear   Anterior Chamber Deep and quiet Deep and quiet   Iris Round and reactive Round and reactive   Lens Posterior chamber intraocular lens Posterior chamber intraocular lens   Anterior Vitreous  Normal Normal       Fundus Exam      Right  Left   Posterior Vitreous  Normal   Disc  Normal   C/D Ratio  0.3   Macula  Geographic atrophy, Advanced age related macular degeneration, Disciform scar, less  Macular thickening, Subretinal neovascular membrane temporally, Pigmented atrophy   Vessels  Normal   Periphery  Normal          IMAGING AND PROCEDURES  Imaging and Procedures for 05/06/20  OCT, Retina - OU - Both Eyes       Right Eye Quality was good. Scan locations included subfoveal. Central Foveal Thickness: 236. Progression has been stable. Findings include disciform scar.   Left Eye Quality was good. Scan locations included subfoveal. Central Foveal Thickness: 383. Progression has been stable. Findings include disciform scar, subretinal fluid, intraretinal fluid.   Notes OD, with actively perimacular lesional CNVM, return follow-up visit next week as scheduled  OS large disciform scar temporal to the foveal region with intraretinal fluid and CME, will at 8 weeks, repeat injection OS today to prevent and scotoma enlargement       Intravitreal Injection, Pharmacologic Agent - OS - Left Eye       Time Out 05/06/2020. 11:22 AM. Confirmed correct patient, procedure, site, and patient consented.   Anesthesia Topical anesthesia was used. Anesthetic medications included Akten 3.5%.   Procedure Preparation included 5% betadine to ocular surface, 10% betadine to eyelids, Ofloxacin . A supplied needle was used.   Injection:  1.25 mg Bevacizumab (AVASTIN) SOLN   NDC: [address]-1, Lot: [id number]   Route: Intravitreal, Site: Left Eye, Waste: 0 mg  Post-op Post injection exam found visual acuity of at least counting fingers. The patient tolerated the procedure well. There were no complications. The patient received written and verbal post procedure care education. Post injection medications were not given.                 ASSESSMENT/PLAN:  Exudative age-related  macular degeneration of left eye with active choroidal neovascularization (HCC) OS large disciform scar temporal to the foveal region with intraretinal fluid and CME, will at 8 weeks, repeat injection OS today to prevent and scotoma enlargement      ICD-10-CM   1. Exudative age-related macular degeneration of left eye with active choroidal neovascularization (HCC)  H35.3221 OCT, Retina - OU - Both Eyes    Intravitreal Injection, Pharmacologic Agent - OS - Left Eye    Bevacizumab (AVASTIN) SOLN 1.25 mg  2. Exudative age-related macular degeneration of right eye with active choroidal neovascularization (HCC)  H35.3211     1.  Repeat injection intravitreal Avastin OS today to control size of scotoma with active disciform lesion temporal to the macula.  Residual subretinal fluid and hemorrhage remains.  At 8-week follow-up  2.  Return visit OD as scheduled, next week  3.  Ophthalmic Meds Ordered this visit:  Meds ordered this encounter  Medications  . Bevacizumab (AVASTIN) SOLN 1.25 mg       Return in about 8 weeks (around 07/01/2020) for dilate, OS, AVASTIN OCT.  There are no Patient Instructions on file for this visit.   Explained the diagnoses, plan, and follow up with the patient and they expressed understanding.  Patient expressed understanding of the importance of proper follow up care.   Alford Highland Aaliyah Cancro M.D. Diseases & Surgery of the Retina and Vitreous [facility] 05/06/20     Abbreviations: M myopia (nearsighted); A astigmatism; H hyperopia (farsighted); P presbyopia; Mrx spectacle prescription;  CTL contact lenses; OD right eye;  OS left eye; OU both eyes  XT exotropia; ET esotropia; PEK punctate epithelial keratitis; PEE punctate epithelial erosions; DES dry eye syndrome; MGD meibomian gland dysfunction; ATs artificial tears; PFAT's preservative free artificial tears; NSC nuclear sclerotic cataract; PSC posterior subcapsular cataract; ERM epi-retinal  membrane; PVD posterior vitreous detachment; RD retinal detachment; DM diabetes mellitus; DR diabetic retinopathy; NPDR non-proliferative diabetic retinopathy; PDR proliferative diabetic retinopathy; CSME clinically significant macular edema; DME diabetic macular edema; dbh dot blot hemorrhages; CWS cotton wool spot; POAG primary open angle glaucoma; C/D cup-to-disc ratio; HVF humphrey visual field; GVF goldmann visual field; OCT optical coherence tomography; IOP intraocular pressure; BRVO Branch retinal vein occlusion; CRVO central retinal vein occlusion; CRAO central retinal artery occlusion; BRAO branch retinal artery occlusion; RT retinal tear; SB scleral buckle; PPV pars plana vitrectomy; VH Vitreous hemorrhage; PRP panretinal laser photocoagulation; IVK intravitreal kenalog; VMT vitreomacular traction; MH Macular hole;  NVD neovascularization of the disc; NVE neovascularization elsewhere; AREDS age related eye disease study; ARMD age related macular degeneration; POAG primary open angle glaucoma; EBMD epithelial/anterior basement membrane dystrophy; ACIOL anterior chamber intraocular lens; IOL intraocular lens; PCIOL posterior chamber intraocular lens; Phaco/IOL phacoemulsification with intraocular lens placement; PRK photorefractive keratectomy; LASIK laser assisted in situ keratomileusis; HTN hypertension; DM diabetes mellitus; COPD chronic obstructive pulmonary disease

## 2020-05-07 ENCOUNTER — Ambulatory Visit: Payer: Medicare Other | Admitting: Cardiology

## 2020-05-12 ENCOUNTER — Encounter (INDEPENDENT_AMBULATORY_CARE_PROVIDER_SITE_OTHER): Payer: Medicare Other | Admitting: Ophthalmology

## 2020-05-12 ENCOUNTER — Ambulatory Visit: Payer: Medicare Other | Admitting: Cardiology

## 2020-05-13 ENCOUNTER — Other Ambulatory Visit: Payer: Self-pay

## 2020-05-13 ENCOUNTER — Ambulatory Visit (INDEPENDENT_AMBULATORY_CARE_PROVIDER_SITE_OTHER): Payer: Medicare Other | Admitting: Ophthalmology

## 2020-05-13 ENCOUNTER — Encounter (INDEPENDENT_AMBULATORY_CARE_PROVIDER_SITE_OTHER): Payer: Self-pay | Admitting: Ophthalmology

## 2020-05-13 DIAGNOSIS — H353221 Exudative age-related macular degeneration, left eye, with active choroidal neovascularization: Secondary | ICD-10-CM

## 2020-05-13 DIAGNOSIS — H353211 Exudative age-related macular degeneration, right eye, with active choroidal neovascularization: Secondary | ICD-10-CM | POA: Diagnosis not present

## 2020-05-13 MED ORDER — BEVACIZUMAB CHEMO INJECTION 1.25MG/0.05ML SYRINGE FOR KALEIDOSCOPE
1.2500 mg | INTRAVITREAL | Status: AC | PRN
  Administered 2020-05-13: 1.25 mg via INTRAVITREAL

## 2020-05-13 NOTE — Patient Instructions (Signed)
Patient asked to return promptly for new onset visual acuity decline or distortions

## 2020-05-13 NOTE — Progress Notes (Signed)
05/13/2020     CHIEF COMPLAINT Patient presents for Retina Follow Up   HISTORY OF PRESENT ILLNESS: [name] is a 86 y.o. female who presents to the clinic today for:   HPI    Retina Follow Up    Patient presents with  Wet AMD.  In right eye.  This started 6 weeks ago.  Severity is mild.  Duration of 6 weeks.  Since onset it is stable.          Comments    6 Week AMD F/U OD, poss Avastin OD  Pt denies noticeable changes to [place] OU since last visit. Pt denies ocular pain, flashes of light, or floaters OU.         Last edited by Ileana Roup, COA on 05/13/2020 10:25 AM. (History)      Referring physician: Kermit Balo, DO [address] [place],  [place] [address]  HISTORICAL INFORMATION:   Selected notes from the MEDICAL RECORD NUMBER    Lab Results  Component Value Date   HGBA1C  07/23/2007    5.7 (NOTE)   The ADA recommends the following therapeutic goals for glycemic   control related to Hgb A1C measurement:   Goal of Therapy:   < 7.0% Hgb A1C   Action Suggested:  > 8.0% Hgb A1C   Ref:  Diabetes Care, 22, Suppl. 1, 1999     CURRENT MEDICATIONS: Current Outpatient Medications (Ophthalmic Drugs)  Medication Sig  . Propylene Glycol (SYSTANE BALANCE) 0.6 % SOLN Place 1 drop into both eyes 2 (two) times daily.   No current facility-administered medications for this visit. (Ophthalmic Drugs)   Current Outpatient Medications (Other)  Medication Sig  . acetaminophen (TYLENOL) 500 MG tablet Take 500 mg by mouth in the morning and at bedtime.   . ALPRAZolam (XANAX) 0.5 MG tablet TAKE 1 TABLET BY MOUTH THREE TIMES A DAY AS NEEDED FOR ANXIETY  . amLODipine (NORVASC) 10 MG tablet Take 1 tablet (10 mg total) by mouth every evening.  Marland Kitchen ELIQUIS 2.5 MG TABS tablet TAKE 1 TABLET BY MOUTH TWICE A DAY  . hydrochlorothiazide (MICROZIDE) 12.5 MG capsule TAKE 1 CAPSULE (12.5 MG TOTAL) BY MOUTH IN THE MORNING.  Marland Kitchen irbesartan (AVAPRO) 150 MG tablet Take 1 tablet (150 mg total) by  mouth daily.  Marland Kitchen KLOR-CON M20 20 MEQ tablet TAKE 1 TABLET BY MOUTH EVERY DAY  . levothyroxine (SYNTHROID) 100 MCG tablet TAKE 1 TABLET BY MOUTH EVERY DAY BEFORE BREAKFAST  . loratadine (CLARITIN) 10 MG tablet Take 10 mg by mouth daily.   . metoprolol succinate (TOPROL XL) 100 MG 24 hr tablet Take 1 tablet (100 mg total) by mouth daily. Hold if systolic blood pressure (top blood pressure number) less than 100 mmHg or heart rate less than 60 bpm (pulse).  . pantoprazole (PROTONIX) 40 MG tablet TAKE 1 TABLET BY MOUTH EVERY DAY  . polyethylene glycol (MIRALAX / GLYCOLAX) packet Take 17 g by mouth daily.   . sertraline (ZOLOFT) 50 MG tablet Take one tablet by mouth once daily to calm nerves.  . temazepam (RESTORIL) 30 MG capsule TAKE 1 CAPSULE BY MOUTH AT BEDTIME AS NEEDED FOR SLEEP   No current facility-administered medications for this visit. (Other)      REVIEW OF SYSTEMS:    ALLERGIES Allergies  Allergen Reactions  . Ambien [Zolpidem Tartrate]     hallucination  . Bactrim [Sulfamethoxazole-Trimethoprim]     Unknown reaction   . Morphine And Related Nausea  And Vomiting  . Zithromax [Azithromycin]     Not effective      PAST MEDICAL HISTORY Past Medical History:  Diagnosis Date  . Allergic rhinitis due to pollen   . Anxiety state, unspecified   . Calculus of kidney   . Cataract   . Diaphragmatic hernia without mention of obstruction or gangrene   . Diffuse cystic mastopathy   . Dizziness and giddiness   . GERD (gastroesophageal reflux disease)   . Hiatal hernia   . Insomnia, unspecified   . Insomnia, unspecified   . Lumbago   . Macular degeneration (senile) of retina, unspecified   . Open wound of toe(s), without mention of complication   . Osteoarthrosis, unspecified whether generalized or localized, unspecified site   . Other and unspecified hyperlipidemia   . Personal history of fall   . Sciatica   . Sebaceous cyst   . Senile osteoporosis   . Unspecified  constipation   . Unspecified essential hypertension   . Unspecified hereditary and idiopathic peripheral neuropathy   . Unspecified hypothyroidism   . Unspecified tinnitus    Past Surgical History:  Procedure Laterality Date  . ABDOMINAL HYSTERECTOMY    . CATARACT EXTRACTION, BILATERAL    . CHOLECYSTECTOMY  07/25/2007  . TONSILLECTOMY AND ADENOIDECTOMY Bilateral 1943    FAMILY HISTORY Family History  Problem Relation Age of Onset  . Hypertension Brother   . Fibromyalgia Daughter   . Hypertension Son   . Hypertension Brother   . Hyperlipidemia Brother   . Breast cancer Neg Hx     SOCIAL HISTORY Social History   Tobacco Use  . Smoking status: Never Smoker  . Smokeless tobacco: Never Used  Vaping Use  . Vaping Use: Never used  Substance Use Topics  . Alcohol use: No    Alcohol/week: 0.0 standard drinks  . Drug use: No         OPHTHALMIC EXAM:  Base Eye Exam    Visual Acuity (ETDRS)      Right Left   Dist cc 20/50 +1 CF at 3'   Dist ph cc NI 20/400   Correction: Glasses       Tonometry (Tonopen, 10:26 AM)      Right Left   Pressure 14 15       Pupils      Pupils Dark Light Shape React APD   Right PERRL 3 2 Round Brisk None   Left PERRL 3 2 Round Brisk None       Visual Fields (Counting fingers)      Left Right     Full   Restrictions Partial outer superior nasal deficiency        Extraocular Movement      Right Left    Full Full       Neuro/Psych    Oriented x3: Yes   Mood/Affect: Normal       Dilation    Right eye: 1.0% Mydriacyl, 2.5% Phenylephrine @ 10:30 AM        Slit Lamp and Fundus Exam    External Exam      Right Left   External Normal Normal       Slit Lamp Exam      Right Left   Lids/Lashes Normal Normal   Conjunctiva/Sclera White and quiet White and quiet   Cornea Clear Clear   Anterior Chamber Deep and quiet Deep and quiet   Iris Round and reactive Round and reactive   Lens Posterior  chamber intraocular lens  Posterior chamber intraocular lens   Anterior Vitreous Normal Normal       Fundus Exam      Right Left   Posterior Vitreous Posterior vitreous detachment    Disc Normal    C/D Ratio 0.0    Macula Geographic atrophy, Advanced age related macular degeneration, Disciform scar, Macular thickening, Subretinal neovascular membrane, Subretinal hemorrhage smaller temporal to the pannus., Exudates less active    Vessels Normal    Periphery Normal           IMAGING AND PROCEDURES  Imaging and Procedures for 05/13/20  OCT, Retina - OU - Both Eyes       Right Eye Quality was borderline. Progression has been stable. Findings include abnormal foveal contour, disciform scar, intraretinal fluid.   Left Eye Quality was good. Progression has improved. Findings include abnormal foveal contour, disciform scar, intraretinal fluid.   Notes OU improved and stable       Intravitreal Injection, Pharmacologic Agent - OD - Right Eye       Time Out 05/13/2020. 11:27 AM. Confirmed correct patient, procedure, site, and patient consented.   Anesthesia Topical anesthesia was used.   Procedure Preparation included Ofloxacin , 10% betadine to eyelids, 5% betadine to ocular surface. A supplied needle was used.   Injection:  1.25 mg Bevacizumab (AVASTIN) SOLN   NDC: [address]-7, Lot: [id number]   Route: Intravitreal, Site: Right Eye, Waste: 0 mg  Post-op Post injection exam found visual acuity of at least counting fingers. The patient tolerated the procedure well. There were no complications. Post injection medications were not given.                 ASSESSMENT/PLAN:  Exudative age-related macular degeneration of right eye with active choroidal neovascularization (HCC) Region temporally, much less extensive yet still present on intravitreal Avastin preventing extension into the foveal region.  Repeat injection right eye today and exam again in 6 weeks      ICD-10-CM   1. Exudative  age-related macular degeneration of right eye with active choroidal neovascularization (HCC)  H35.3211 OCT, Retina - OU - Both Eyes    Intravitreal Injection, Pharmacologic Agent - OD - Right Eye    Bevacizumab (AVASTIN) SOLN 1.25 mg  2. Exudative age-related macular degeneration of left eye with active choroidal neovascularization (HCC)  H35.3221     1.  Repeat intravitreal injection Avastin today to prevent spread of large disciform active scar temporal to the macula right eye, currently at 6-week follow-up interval  2.  Follow-up examination left eye as scheduled  3.  Ophthalmic Meds Ordered this visit:  Meds ordered this encounter  Medications  . Bevacizumab (AVASTIN) SOLN 1.25 mg       Return in about 6 weeks (around 06/24/2020) for dilate, OD, AVASTIN OCT.  Patient Instructions  Patient asked to return promptly for new onset visual acuity decline or distortions    Explained the diagnoses, plan, and follow up with the patient and they expressed understanding.  Patient expressed understanding of the importance of proper follow up care.   Alford Highland Janet Decesare M.D. Diseases & Surgery of the Retina and Vitreous [facility] 05/13/20     Abbreviations: M myopia (nearsighted); A astigmatism; H hyperopia (farsighted); P presbyopia; Mrx spectacle prescription;  CTL contact lenses; OD right eye; OS left eye; OU both eyes  XT exotropia; ET esotropia; PEK punctate epithelial keratitis; PEE punctate epithelial erosions; DES dry eye syndrome; MGD meibomian gland  dysfunction; ATs artificial tears; PFAT's preservative free artificial tears; NSC nuclear sclerotic cataract; PSC posterior subcapsular cataract; ERM epi-retinal membrane; PVD posterior vitreous detachment; RD retinal detachment; DM diabetes mellitus; DR diabetic retinopathy; NPDR non-proliferative diabetic retinopathy; PDR proliferative diabetic retinopathy; CSME clinically significant macular edema; DME diabetic  macular edema; dbh dot blot hemorrhages; CWS cotton wool spot; POAG primary open angle glaucoma; C/D cup-to-disc ratio; HVF humphrey visual field; GVF goldmann visual field; OCT optical coherence tomography; IOP intraocular pressure; BRVO Branch retinal vein occlusion; CRVO central retinal vein occlusion; CRAO central retinal artery occlusion; BRAO branch retinal artery occlusion; RT retinal tear; SB scleral buckle; PPV pars plana vitrectomy; VH Vitreous hemorrhage; PRP panretinal laser photocoagulation; IVK intravitreal kenalog; VMT vitreomacular traction; MH Macular hole;  NVD neovascularization of the disc; NVE neovascularization elsewhere; AREDS age related eye disease study; ARMD age related macular degeneration; POAG primary open angle glaucoma; EBMD epithelial/anterior basement membrane dystrophy; ACIOL anterior chamber intraocular lens; IOL intraocular lens; PCIOL posterior chamber intraocular lens; Phaco/IOL phacoemulsification with intraocular lens placement; PRK photorefractive keratectomy; LASIK laser assisted in situ keratomileusis; HTN hypertension; DM diabetes mellitus; COPD chronic obstructive pulmonary disease

## 2020-05-13 NOTE — Assessment & Plan Note (Signed)
Region temporally, much less extensive yet still present on intravitreal Avastin preventing extension into the foveal region.  Repeat injection right eye today and exam again in 6 weeks

## 2020-05-14 DIAGNOSIS — I10 Essential (primary) hypertension: Secondary | ICD-10-CM | POA: Diagnosis not present

## 2020-05-18 DIAGNOSIS — Z20828 Contact with and (suspected) exposure to other viral communicable diseases: Secondary | ICD-10-CM | POA: Diagnosis not present

## 2020-05-18 DIAGNOSIS — Z1159 Encounter for screening for other viral diseases: Secondary | ICD-10-CM | POA: Diagnosis not present

## 2020-05-19 ENCOUNTER — Other Ambulatory Visit: Payer: Self-pay | Admitting: Internal Medicine

## 2020-05-19 ENCOUNTER — Other Ambulatory Visit: Payer: Self-pay

## 2020-05-19 ENCOUNTER — Ambulatory Visit: Payer: Medicare Other | Admitting: Cardiology

## 2020-05-19 ENCOUNTER — Encounter: Payer: Self-pay | Admitting: Cardiology

## 2020-05-19 VITALS — BP 138/84 | HR 69 | Ht 68.0 in | Wt 115.0 lb

## 2020-05-19 DIAGNOSIS — I480 Paroxysmal atrial fibrillation: Secondary | ICD-10-CM | POA: Diagnosis not present

## 2020-05-19 DIAGNOSIS — I10 Essential (primary) hypertension: Secondary | ICD-10-CM

## 2020-05-19 DIAGNOSIS — F411 Generalized anxiety disorder: Secondary | ICD-10-CM

## 2020-05-19 DIAGNOSIS — Z7901 Long term (current) use of anticoagulants: Secondary | ICD-10-CM | POA: Diagnosis not present

## 2020-05-19 DIAGNOSIS — E039 Hypothyroidism, unspecified: Secondary | ICD-10-CM

## 2020-05-19 NOTE — Telephone Encounter (Signed)
RX last filled 10/21/2019 in [facility], #90/5 refills   Treatment agreement on file from 07/19/2019

## 2020-05-19 NOTE — Progress Notes (Signed)
ID:  [name], DOB November 13, 1930, MRN [id number]  PCP:  Kermit Balo, DO  Cardiologist:  Tessa Lerner, DO, [facility] (established care 12/09/2019)  Date: 05/19/2020 Last Office Visit: 02/05/2020  Chief Complaint  Patient presents with  . Hypertension  . Atrial Fibrillation    HPI  [name] is a 86 y.o. female whose past medical history and cardiac risk factors include: Hypothyroidism, hypertension, paroxysmal atrial fibrillation, postmenopausal female, and advanced age.  Patient presents to the office with a chief complaint of "atrial fibrillation and blood pressure follow-up."   She was originally seen in April 2021 at the request of her primary care provider for evaluation of newly diagnosed atrial fibrillation and hypertension management.  Benign essential hypertension: Patient was referred to the office initially for evaluation and management of hypertension.  Prior to establishing care with our office her systolic blood pressures would be as high as 180-190 mmHg.  Her medications have been uptitrated slowly and enrolled into principal care management.  As result her blood pressures have come down very nicely.  Patient is 2-week blood pressure averages are 138/84 with a pulse of 75 bpm.  Medications reconciled.  Patient is very pleased of the blood pressure readings she is now getting as opposed to in the past.  Atrial fibrillation: Patient remains asymptomatic.  She is doing well on metoprolol and Eliquis.  Does not endorse any evidence of bleeding.  Patient's ventricular rate remains controlled. Reiterated the risks, benefits, and alternatives to oral anticoagulation at today's office visit.  No recent falls or imbalance with gait.  She does use a walker for ambulation to help prevent falls.  Fall precautions we reviewed with the patient at today's visit.  She had  an echocardiogram and nuclear stress test given the new diagnosis of atrial fibrillation.  Patient nuclear stress test was  reported to be intermediate risk and is requesting conservative management as opposed to undergoing left heart catheterization with possible PCI.  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  FUNCTIONAL STATUS: Able to do her ADLs and walks on a daily routine.    ALLERGIES: Allergies  Allergen Reactions  . Ambien [Zolpidem Tartrate]     hallucination  . Bactrim [Sulfamethoxazole-Trimethoprim]     Unknown reaction   . Morphine And Related Nausea And Vomiting  . Zithromax [Azithromycin]     Not effective      MEDICATION LIST PRIOR TO VISIT: Current Meds  Medication Sig  . acetaminophen (TYLENOL) 500 MG tablet Take 500 mg by mouth in the morning and at bedtime.   Marland Kitchen amLODipine (NORVASC) 10 MG tablet Take 1 tablet (10 mg total) by mouth every evening.  Marland Kitchen ELIQUIS 2.5 MG TABS tablet TAKE 1 TABLET BY MOUTH TWICE A DAY  . hydrochlorothiazide (MICROZIDE) 12.5 MG capsule TAKE 1 CAPSULE (12.5 MG TOTAL) BY MOUTH IN THE MORNING.  Marland Kitchen irbesartan (AVAPRO) 150 MG tablet Take 1 tablet (150 mg total) by mouth daily.  Marland Kitchen KLOR-CON M20 20 MEQ tablet TAKE 1 TABLET BY MOUTH EVERY DAY  . levothyroxine (SYNTHROID) 100 MCG tablet TAKE 1 TABLET BY MOUTH EVERY DAY BEFORE BREAKFAST  . loratadine (CLARITIN) 10 MG tablet Take 10 mg by mouth daily.   . metoprolol succinate (TOPROL XL) 100 MG 24 hr tablet Take 1 tablet (100 mg total) by mouth daily. Hold if systolic blood pressure (top blood pressure number) less than 100 mmHg or heart rate less than 60 bpm (pulse).  Marland Kitchen  pantoprazole (PROTONIX) 40 MG tablet TAKE 1 TABLET BY MOUTH EVERY DAY  . polyethylene glycol (MIRALAX / GLYCOLAX) packet Take 17 g by mouth daily.   Marland Kitchen Propylene Glycol (SYSTANE BALANCE) 0.6 % SOLN Place 1 drop into both eyes 2 (two) times daily.  . sertraline (ZOLOFT) 50 MG tablet Take one tablet by mouth once daily to calm nerves.  . temazepam (RESTORIL) 30 MG capsule TAKE 1 CAPSULE BY MOUTH AT BEDTIME AS NEEDED FOR  SLEEP  . [DISCONTINUED] ALPRAZolam (XANAX) 0.5 MG tablet TAKE 1 TABLET BY MOUTH THREE TIMES A DAY AS NEEDED FOR ANXIETY     PAST MEDICAL HISTORY: Past Medical History:  Diagnosis Date  . Allergic rhinitis due to pollen   . Anxiety state, unspecified   . Calculus of kidney   . Cataract   . Diaphragmatic hernia without mention of obstruction or gangrene   . Diffuse cystic mastopathy   . Dizziness and giddiness   . GERD (gastroesophageal reflux disease)   . Hiatal hernia   . Insomnia, unspecified   . Insomnia, unspecified   . Lumbago   . Macular degeneration (senile) of retina, unspecified   . Open wound of toe(s), without mention of complication   . Osteoarthrosis, unspecified whether generalized or localized, unspecified site   . Other and unspecified hyperlipidemia   . Personal history of fall   . Sciatica   . Sebaceous cyst   . Senile osteoporosis   . Unspecified constipation   . Unspecified essential hypertension   . Unspecified hereditary and idiopathic peripheral neuropathy   . Unspecified hypothyroidism   . Unspecified tinnitus     PAST SURGICAL HISTORY: Past Surgical History:  Procedure Laterality Date  . ABDOMINAL HYSTERECTOMY    . CATARACT EXTRACTION, BILATERAL    . CHOLECYSTECTOMY  07/25/2007  . TONSILLECTOMY AND ADENOIDECTOMY Bilateral 1943    FAMILY HISTORY: The patient family history includes Fibromyalgia in her daughter; Hyperlipidemia in her brother; Hypertension in her brother, brother, and son.  SOCIAL HISTORY:  The patient  reports that she has never smoked. She has never used smokeless tobacco. She reports that she does not drink alcohol and does not use drugs.  REVIEW OF SYSTEMS: Review of Systems  Constitutional: Negative for chills and fever.  HENT: Negative for hoarse voice and nosebleeds.   Eyes: Negative for discharge, double vision and pain.  Cardiovascular: Negative for chest pain, claudication, dyspnea on exertion, leg swelling,  near-syncope, orthopnea, palpitations, paroxysmal nocturnal dyspnea and syncope.  Respiratory: Negative for hemoptysis and shortness of breath.   Musculoskeletal: Negative for muscle cramps and myalgias.  Gastrointestinal: Negative for abdominal pain, constipation, diarrhea, hematemesis, hematochezia, melena, nausea and vomiting.  Neurological: Negative for dizziness and light-headedness.    PHYSICAL EXAM: Vitals with BMI 05/19/2020 04/02/2020 02/05/2020  Height 5\' 8"  5\' 8"  -  Weight 115 lbs 113 lbs -  BMI 17.49 17.19 -  Systolic 138 140  Diastolic 84 68 80  Pulse 69 77 79    CONSTITUTIONAL: Well-developed and well-nourished. No acute distress.  SKIN: Skin is warm and dry. No rash noted. No cyanosis. No pallor. No jaundice HEAD: Normocephalic and atraumatic.  EYES: No scleral icterus MOUTH/THROAT: Moist oral membranes.  NECK: No JVD present. No thyromegaly noted.  LYMPHATIC: No visible cervical adenopathy.  CHEST Normal respiratory effort. No intercostal retractions  LUNGS: Clear to auscultation bilaterally.  No stridor. No wheezes. No rales.  CARDIOVASCULAR: Regular rate and rhythm, positive S1 and S2, soft systolic murmur heard at the  left sternal border, no gallops or rubs. ABDOMINAL: No apparent ascites.  EXTREMITIES: No peripheral edema, warm to touch bilaterally HEMATOLOGIC: No significant bruising NEUROLOGIC: Oriented to person, place, and time. Nonfocal. Normal muscle tone.  PSYCHIATRIC: Normal mood and affect. Normal behavior. Cooperative  CARDIAC DATABASE: EKG: 12/09/2019: Normal sinus rhythm, 70 bpm, normal axis, poor R wave progression, LVH per voltage criteria, cannot rule out old anterior infarct, without underlying injury pattern.  Echocardiogram: 12/20/2019:  Normal LV systolic function with visual EF 55-60%. Left ventricle cavity is normal in size. Mild concentric hypertrophy of the left ventricle.  Normal global wall motion. Doppler evidence of grade II  (pseudonormal) diastolic dysfunction, elevated LAP. Calculated EF 50%.  Patient was in sinus rhythm during exam.  Left atrial cavity is severely dilated with LA voume index of 70 mL/m2.  Structurally normal mitral valve. Mild (Grade I) mitral regurgitation.  Structurally normal tricuspid valve. Mild tricuspid regurgitation. No evidence of pulmonary hypertension. RVSP measures 30 mmHg.  Stress Testing: 12/30/2019: Small sized, mild intensity, partially reversible perfusion defect in basal inferoseptal myocardium. Normal myocardial perfusion. All segments of left ventricle demonstrated normal wall motion and thickening. Stress LVEF calculated 42%, although visually appears normal.  Heart Catheterization: None  LABORATORY DATA: CBC Latest Ref Rng & Units 11/17/2019 12/05/2018 07/23/2018  WBC 4.0 - 10.5 K/uL 4.8 4.1 3.7(L)  Hemoglobin 12.0 - 15.0 g/dL 16.114.1 09.613.3 04.512.9  Hematocrit 36 - 46 % 42.9 38.9 39.1  Platelets 150 - 400 K/uL 117(L) 141 143    CMP Latest Ref Rng & Units 01/14/2020 11/17/2019 08/28/2019  Glucose 65 - 99 mg/dL 409(W137(H) 119(J131(H) 97  BUN 8 - 27 mg/dL 22 12 17   Creatinine 0.57 - 1.00 mg/dL 4.780.91 2.950.72 6.210.71  Sodium 134 - 144 mmol/L 136 129(L) 130(L)  Potassium 3.5 - 5.2 mmol/L 4.3 4.2 3.6  Chloride 96 - 106 mmol/L 92(L) 87(L) 87(L)  CO2 20 - 29 mmol/L 31(H) 31 37(H)  Calcium 8.7 - 10.3 mg/dL 9.9 9.3 9.6  Total Protein 6.1 - 8.1 g/dL - - -  Total Bilirubin 0.2 - 1.2 mg/dL - - -  Alkaline Phos 38 - 126 U/L - - -  AST 10 - 35 U/L - - -  ALT 6 - 29 U/L - - -    Lipid Panel     Component Value Date/Time   CHOL 175 03/05/2018 1017   CHOL 131 07/30/2015 1053   TRIG 147 03/05/2018 1017   HDL 59 03/05/2018 1017   HDL 59 07/30/2015 1053   CHOLHDL 3.0 03/05/2018 1017   VLDL 24 10/05/2016 0957   LDLCALC 92 03/05/2018 1017   LABVLDL 15 07/30/2015 1053    Lab Results  Component Value Date   HGBA1C  07/23/2007    5.7 (NOTE)   The ADA recommends the following therapeutic goals for  glycemic   control related to Hgb A1C measurement:   Goal of Therapy:   < 7.0% Hgb A1C   Action Suggested:  > 8.0% Hgb A1C   Ref:  Diabetes Care, 22, Suppl. 1, 1999   No components found for: NTPROBNP Lab Results  Component Value Date   TSH 3.002 11/17/2019   TSH 4.73 (H) 12/05/2018   TSH 6.71 (H) 07/23/2018   IMPRESSION:    ICD-10-CM   1. Paroxysmal atrial fibrillation (HCC)  I48.0   2. Long term (current) use of anticoagulants  Z79.01   3. Essential hypertension  I10 Basic metabolic panel    Hemoglobin and hematocrit, blood  Magnesium    Magnesium    Hemoglobin and hematocrit, blood    Basic metabolic panel  4. Hypothyroidism, adult  E03.9      RECOMMENDATIONS: KATHEY SIMER is a 86 y.o. female whose past medical history and cardiac risk factors include: Hypothyroidism, hypertension, paroxysmal atrial fibrillation, postmenopausal female, and advanced age.  Paroxysmal atrial fibrillation: Currently normal sinus rhythm.  Newly diagnosed April 2021.  Rate control: Toprol-XL, refilled.  Rhythm control: N/A   Thromboembolic prophylaxis: Eliquis   CHA2DS2-VASc SCORE is 4 which correlates to 4 % risk of stroke per year.  Long-term oral anticoagulation:   Indication paroxysmal atrial fibrillation.    Patient does not endorse any evidence of bleeding. No prior history of intracranial bleed or internal bleeding.    Patient educated on the risks, benefits, and alternatives to oral anticoagulation she verbalizes understanding.  She uses a walker and is educated on fall prevention.   Benign essential hypertension:  Medications reviewed.  Blood pressure have improved since consultation  Home BP log reviewed; average BP 138/84 and pulse of 75.  Continue with ambulatory blood pressure monitoring.  We will recheck kidney function, electrolytes, and hemoglobin prior to the next office visit.  FINAL MEDICATION LIST END OF ENCOUNTER: No orders of the defined types were  placed in this encounter.   There are no discontinued medications.   Current Outpatient Medications:  .  acetaminophen (TYLENOL) 500 MG tablet, Take 500 mg by mouth in the morning and at bedtime. , Disp: , Rfl:  .  amLODipine (NORVASC) 10 MG tablet, Take 1 tablet (10 mg total) by mouth every evening., Disp: 90 tablet, Rfl: 1 .  ELIQUIS 2.5 MG TABS tablet, TAKE 1 TABLET BY MOUTH TWICE A DAY, Disp: 60 tablet, Rfl: 3 .  hydrochlorothiazide (MICROZIDE) 12.5 MG capsule, TAKE 1 CAPSULE (12.5 MG TOTAL) BY MOUTH IN THE MORNING., Disp: 90 capsule, Rfl: 0 .  irbesartan (AVAPRO) 150 MG tablet, Take 1 tablet (150 mg total) by mouth daily., Disp: 90 tablet, Rfl: 3 .  KLOR-CON M20 20 MEQ tablet, TAKE 1 TABLET BY MOUTH EVERY DAY, Disp: 90 tablet, Rfl: 1 .  levothyroxine (SYNTHROID) 100 MCG tablet, TAKE 1 TABLET BY MOUTH EVERY DAY BEFORE BREAKFAST, Disp: 90 tablet, Rfl: 3 .  loratadine (CLARITIN) 10 MG tablet, Take 10 mg by mouth daily. , Disp: , Rfl:  .  metoprolol succinate (TOPROL XL) 100 MG 24 hr tablet, Take 1 tablet (100 mg total) by mouth daily. Hold if systolic blood pressure (top blood pressure number) less than 100 mmHg or heart rate less than 60 bpm (pulse)., Disp: 90 tablet, Rfl: 1 .  pantoprazole (PROTONIX) 40 MG tablet, TAKE 1 TABLET BY MOUTH EVERY DAY, Disp: 90 tablet, Rfl: 1 .  polyethylene glycol (MIRALAX / GLYCOLAX) packet, Take 17 g by mouth daily. , Disp: , Rfl:  .  Propylene Glycol (SYSTANE BALANCE) 0.6 % SOLN, Place 1 drop into both eyes 2 (two) times daily., Disp: , Rfl:  .  sertraline (ZOLOFT) 50 MG tablet, Take one tablet by mouth once daily to calm nerves., Disp: 90 tablet, Rfl: 1 .  temazepam (RESTORIL) 30 MG capsule, TAKE 1 CAPSULE BY MOUTH AT BEDTIME AS NEEDED FOR SLEEP, Disp: 30 capsule, Rfl: 5 .  ALPRAZolam (XANAX) 0.5 MG tablet, TAKE 1 TABLET BY MOUTH THREE TIMES A DAY AS NEEDED FOR ANXIETY, Disp: 90 tablet, Rfl: 5  Orders Placed This Encounter  Procedures  . Basic metabolic  panel  . Hemoglobin and  hematocrit, blood  . Magnesium   There are no Patient Instructions on file for this visit. --Continue cardiac medications as reconciled in final medication list. --Return in about 6 months (around 11/27/2020) for Reevaluation of, BP, Afib. Blood work prior to next visit. . Or sooner if needed. --Continue follow-up with your primary care physician regarding the management of your other chronic comorbid conditions.  Patient's questions and concerns were addressed to her satisfaction. She voices understanding of the instructions provided during this encounter.   This note was created using a voice recognition software as a result there may be grammatical errors inadvertently enclosed that do not reflect the nature of this encounter. Every attempt is made to correct such errors.  Tessa Lerner, [place], [facility]  Pager: [phone] Office: [phone]

## 2020-05-25 DIAGNOSIS — Z20828 Contact with and (suspected) exposure to other viral communicable diseases: Secondary | ICD-10-CM | POA: Diagnosis not present

## 2020-05-25 DIAGNOSIS — Z1159 Encounter for screening for other viral diseases: Secondary | ICD-10-CM | POA: Diagnosis not present

## 2020-06-04 ENCOUNTER — Other Ambulatory Visit: Payer: Self-pay | Admitting: Internal Medicine

## 2020-06-10 ENCOUNTER — Ambulatory Visit (INDEPENDENT_AMBULATORY_CARE_PROVIDER_SITE_OTHER): Payer: Medicare Other | Admitting: Otolaryngology

## 2020-06-10 ENCOUNTER — Other Ambulatory Visit: Payer: Self-pay

## 2020-06-10 ENCOUNTER — Encounter (INDEPENDENT_AMBULATORY_CARE_PROVIDER_SITE_OTHER): Payer: Self-pay | Admitting: Otolaryngology

## 2020-06-10 VITALS — Temp 95.9°F

## 2020-06-10 DIAGNOSIS — H6123 Impacted cerumen, bilateral: Secondary | ICD-10-CM | POA: Diagnosis not present

## 2020-06-10 NOTE — Progress Notes (Signed)
HPI: [name] is a 86 y.o. female who presents for evaluation of wax buildup in her ears.  She has blockage mostly of the right ear.  She also recently burned the superior aspect of her pinna on the right side while getting her hair curled.  She had a large scab that recently fell off...  Past Medical History:  Diagnosis Date  . Allergic rhinitis due to pollen   . Anxiety state, unspecified   . Calculus of kidney   . Cataract   . Diaphragmatic hernia without mention of obstruction or gangrene   . Diffuse cystic mastopathy   . Dizziness and giddiness   . GERD (gastroesophageal reflux disease)   . Hiatal hernia   . Insomnia, unspecified   . Insomnia, unspecified   . Lumbago   . Macular degeneration (senile) of retina, unspecified   . Open wound of toe(s), without mention of complication   . Osteoarthrosis, unspecified whether generalized or localized, unspecified site   . Other and unspecified hyperlipidemia   . Personal history of fall   . Sciatica   . Sebaceous cyst   . Senile osteoporosis   . Unspecified constipation   . Unspecified essential hypertension   . Unspecified hereditary and idiopathic peripheral neuropathy   . Unspecified hypothyroidism   . Unspecified tinnitus    Past Surgical History:  Procedure Laterality Date  . ABDOMINAL HYSTERECTOMY    . CATARACT EXTRACTION, BILATERAL    . CHOLECYSTECTOMY  07/25/2007  . TONSILLECTOMY AND ADENOIDECTOMY Bilateral 1943   Social History   Socioeconomic History  . Marital status: Married    Spouse name: Not on file  . Number of children: 2  . Years of education: Not on file  . Highest education level: Not on file  Occupational History  . Occupation: retired  Tobacco Use  . Smoking status: Never Smoker  . Smokeless tobacco: Never Used  Vaping Use  . Vaping Use: Never used  Substance and Sexual Activity  . Alcohol use: No    Alcohol/week: 0.0 standard drinks  . Drug use: No  . Sexual activity: Never  Other  Topics Concern  . Not on file  Social History Narrative  . Not on file   Social Determinants of Health   Financial Resource Strain:   . Difficulty of Paying Living Expenses: Not on file  Food Insecurity:   . Worried About Programme researcher, broadcasting/film/video in the Last Year: Not on file  . Ran Out of Food in the Last Year: Not on file  Transportation Needs:   . Lack of Transportation (Medical): Not on file  . Lack of Transportation (Non-Medical): Not on file  Physical Activity:   . Days of Exercise per Week: Not on file  . Minutes of Exercise per Session: Not on file  Stress:   . Feeling of Stress : Not on file  Social Connections:   . Frequency of Communication with Friends and Family: Not on file  . Frequency of Social Gatherings with Friends and Family: Not on file  . Attends Religious Services: Not on file  . Active Member of Clubs or Organizations: Not on file  . Attends Banker Meetings: Not on file  . Marital Status: Not on file   Family History  Problem Relation Age of Onset  . Hypertension Brother   . Fibromyalgia Daughter   . Hypertension Son   . Hypertension Brother   . Hyperlipidemia Brother   . Breast cancer Neg Hx  Allergies  Allergen Reactions  . Ambien [Zolpidem Tartrate]     hallucination  . Bactrim [Sulfamethoxazole-Trimethoprim]     Unknown reaction   . Morphine And Related Nausea And Vomiting  . Zithromax [Azithromycin]     Not effective     Prior to Admission medications   Medication Sig Start Date End Date Taking? Authorizing Provider  acetaminophen (TYLENOL) 500 MG tablet Take 500 mg by mouth in the morning and at bedtime.    Yes [provider]  ALPRAZolam (XANAX) 0.5 MG tablet TAKE 1 TABLET BY MOUTH THREE TIMES A DAY AS NEEDED FOR ANXIETY 05/19/20  Yes Reed, Tiffany L, DO  amLODipine (NORVASC) 10 MG tablet Take 1 tablet (10 mg total) by mouth every evening. 02/06/20 08/04/20 Yes Tolia, Sunit, DO  ELIQUIS 2.5 MG TABS tablet TAKE 1  TABLET BY MOUTH TWICE A DAY 03/12/20  Yes Tolia, Sunit, DO  hydrochlorothiazide (MICROZIDE) 12.5 MG capsule TAKE 1 CAPSULE (12.5 MG TOTAL) BY MOUTH IN THE MORNING. 03/30/20 06/28/20 Yes Tolia, Sunit, DO  irbesartan (AVAPRO) 150 MG tablet Take 1 tablet (150 mg total) by mouth daily. 01/16/20  Yes Tolia, Sunit, DO  KLOR-CON M20 20 MEQ tablet TAKE 1 TABLET BY MOUTH EVERY DAY 06/04/20  Yes Reed, Tiffany L, DO  levothyroxine (SYNTHROID) 100 MCG tablet TAKE 1 TABLET BY MOUTH EVERY DAY BEFORE BREAKFAST 02/26/20  Yes Reed, Tiffany L, DO  loratadine (CLARITIN) 10 MG tablet Take 10 mg by mouth daily.    Yes [provider]  metoprolol succinate (TOPROL XL) 100 MG 24 hr tablet Take 1 tablet (100 mg total) by mouth daily. Hold if systolic blood pressure (top blood pressure number) less than 100 mmHg or heart rate less than 60 bpm (pulse). 02/06/20 08/04/20 Yes Tolia, Sunit, DO  pantoprazole (PROTONIX) 40 MG tablet TAKE 1 TABLET BY MOUTH EVERY DAY 03/23/20  Yes Reed, Tiffany L, DO  polyethylene glycol (MIRALAX / GLYCOLAX) packet Take 17 g by mouth daily.    Yes [provider]  Propylene Glycol (SYSTANE BALANCE) 0.6 % SOLN Place 1 drop into both eyes 2 (two) times daily.   Yes [provider]  sertraline (ZOLOFT) 50 MG tablet Take one tablet by mouth once daily to calm nerves. 03/31/20  Yes Reed, Tiffany L, DO  temazepam (RESTORIL) 30 MG capsule TAKE 1 CAPSULE BY MOUTH AT BEDTIME AS NEEDED FOR SLEEP 12/11/19  Yes Reed, Tiffany L, DO     Positive ROS: Otherwise negative  All other systems have been reviewed and were otherwise negative with the exception of those mentioned in the HPI and as above.  Physical Exam: Constitutional: Alert, well-appearing, no acute distress Ears: External ears without lesions or tenderness. Ear canals with a mild amount of wax in both ear canals right side was completely occluded.  Ear canals were cleaned with forceps and curettes.  TMs were clear  bilaterally.. Nasal: External nose without lesions. Clear nasal passages Oral: Oropharynx clear. Neck: No palpable adenopathy or masses Respiratory: Breathing comfortably  Skin: No facial/neck lesions or rash noted.  She has mild erythema of the superior aspect of the right pinna little bit of crusting but no ulceration.  Cerumen impaction removal  Date/Time: 06/10/2020 9:42 AM Performed by: Drema Halon, MD Authorized by: Drema Halon, MD   Consent:    Consent obtained:  Verbal   Consent given by:  Patient   Risks discussed:  Pain and bleeding Procedure details:    Location:  L ear and  R ear   Procedure type: curette and forceps   Post-procedure details:    Inspection:  TM intact and canal normal   Hearing quality:  Improved   Patient tolerance of procedure:  Tolerated well, no immediate complications Comments:     TMs are clear bilaterally    Assessment: Bilateral wax buildup right side worse than left.  Plan: She will follow-up as needed. Concerning the erythema of the right superior pinna suggested following up with dermatology if this is not resolved over the next month.  Narda Bonds, MD

## 2020-06-11 ENCOUNTER — Other Ambulatory Visit: Payer: Self-pay | Admitting: Internal Medicine

## 2020-06-11 DIAGNOSIS — G4700 Insomnia, unspecified: Secondary | ICD-10-CM

## 2020-06-11 NOTE — Telephone Encounter (Signed)
Patient is requesting refill on medication "Temazepam". Last refill for patient was 12/11/2019 with 30 capsules to be taken once daily at bedtime as needed. Patient had 5 refills. Medication pend and sent to provider Kermit Balo, DO for approval. Please Advise.

## 2020-06-14 DIAGNOSIS — I10 Essential (primary) hypertension: Secondary | ICD-10-CM | POA: Diagnosis not present

## 2020-06-22 DIAGNOSIS — Z1159 Encounter for screening for other viral diseases: Secondary | ICD-10-CM | POA: Diagnosis not present

## 2020-06-22 DIAGNOSIS — Z20828 Contact with and (suspected) exposure to other viral communicable diseases: Secondary | ICD-10-CM | POA: Diagnosis not present

## 2020-06-24 ENCOUNTER — Encounter (INDEPENDENT_AMBULATORY_CARE_PROVIDER_SITE_OTHER): Payer: Self-pay | Admitting: Ophthalmology

## 2020-06-24 ENCOUNTER — Other Ambulatory Visit: Payer: Self-pay

## 2020-06-24 ENCOUNTER — Ambulatory Visit (INDEPENDENT_AMBULATORY_CARE_PROVIDER_SITE_OTHER): Payer: Medicare Other | Admitting: Ophthalmology

## 2020-06-24 DIAGNOSIS — H353211 Exudative age-related macular degeneration, right eye, with active choroidal neovascularization: Secondary | ICD-10-CM

## 2020-06-24 DIAGNOSIS — H353221 Exudative age-related macular degeneration, left eye, with active choroidal neovascularization: Secondary | ICD-10-CM | POA: Diagnosis not present

## 2020-06-24 MED ORDER — BEVACIZUMAB CHEMO INJECTION 1.25MG/0.05ML SYRINGE FOR KALEIDOSCOPE
1.2500 mg | INTRAVITREAL | Status: AC | PRN
  Administered 2020-06-24: 1.25 mg via INTRAVITREAL

## 2020-06-24 NOTE — Assessment & Plan Note (Signed)
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

## 2020-06-24 NOTE — Assessment & Plan Note (Signed)
OS active lesion encroaching near the fovea, repeat examination as scheduled next

## 2020-06-24 NOTE — Progress Notes (Signed)
`   06/24/2020     CHIEF COMPLAINT Patient presents for Retina Follow Up   HISTORY OF PRESENT ILLNESS: [name] is a 86 y.o. female who presents to the clinic today for:   HPI    Retina Follow Up    Patient presents with  Wet AMD.  In right eye.  This started 6 weeks ago.  Severity is mild.  Duration of 6 weeks.  Since onset it is stable.          Comments    6 Week AMD F/U OD, poss Avastin OD  Pt denies noticeable changes to [place] OU since last visit. Pt denies ocular pain, flashes of light, or floaters OU.         Last edited by Ileana RoupKennerly, Paige, COA on 06/24/2020 10:33 AM. (History)      Referring physician: Kermit Baloeed, Tiffany L, DO [address] [place] [id number]  HISTORICAL INFORMATION:   Selected notes from the MEDICAL RECORD NUMBER    Lab Results  Component Value Date   HGBA1C  07/23/2007    5.7 (NOTE)   The ADA recommends the following therapeutic goals for glycemic   control related to Hgb A1C measurement:   Goal of Therapy:   < 7.0% Hgb A1C   Action Suggested:  > 8.0% Hgb A1C   Ref:  Diabetes Care, 22, Suppl. 1, 1999     CURRENT MEDICATIONS: Current Outpatient Medications (Ophthalmic Drugs)  Medication Sig  . Propylene Glycol (SYSTANE BALANCE) 0.6 % SOLN Place 1 drop into both eyes 2 (two) times daily.   No current facility-administered medications for this visit. (Ophthalmic Drugs)   Current Outpatient Medications (Other)  Medication Sig  . acetaminophen (TYLENOL) 500 MG tablet Take 500 mg by mouth in the morning and at bedtime.   . ALPRAZolam (XANAX) 0.5 MG tablet TAKE 1 TABLET BY MOUTH THREE TIMES A DAY AS NEEDED FOR ANXIETY  . amLODipine (NORVASC) 10 MG tablet Take 1 tablet (10 mg total) by mouth every evening.  Marland Kitchen. ELIQUIS 2.5 MG TABS tablet TAKE 1 TABLET BY MOUTH TWICE A DAY  . hydrochlorothiazide (MICROZIDE) 12.5 MG capsule TAKE 1 CAPSULE (12.5 MG TOTAL) BY MOUTH IN THE MORNING.  Marland Kitchen. irbesartan (AVAPRO) 150 MG tablet Take 1 tablet (150 mg total)  by mouth daily.  Marland Kitchen. KLOR-CON M20 20 MEQ tablet TAKE 1 TABLET BY MOUTH EVERY DAY  . levothyroxine (SYNTHROID) 100 MCG tablet TAKE 1 TABLET BY MOUTH EVERY DAY BEFORE BREAKFAST  . loratadine (CLARITIN) 10 MG tablet Take 10 mg by mouth daily.   . metoprolol succinate (TOPROL XL) 100 MG 24 hr tablet Take 1 tablet (100 mg total) by mouth daily. Hold if systolic blood pressure (top blood pressure number) less than 100 mmHg or heart rate less than 60 bpm (pulse).  . pantoprazole (PROTONIX) 40 MG tablet TAKE 1 TABLET BY MOUTH EVERY DAY  . polyethylene glycol (MIRALAX / GLYCOLAX) packet Take 17 g by mouth daily.   . sertraline (ZOLOFT) 50 MG tablet Take one tablet by mouth once daily to calm nerves.  . temazepam (RESTORIL) 30 MG capsule TAKE 1 CAPSULE BY MOUTH AT BEDTIME AS NEEDED FOR SLEEP   No current facility-administered medications for this visit. (Other)      REVIEW OF SYSTEMS:    ALLERGIES Allergies  Allergen Reactions  . Ambien [Zolpidem Tartrate]     hallucination  . Bactrim [Sulfamethoxazole-Trimethoprim]     Unknown reaction   . Morphine And Related  Nausea And Vomiting  . Zithromax [Azithromycin]     Not effective      PAST MEDICAL HISTORY Past Medical History:  Diagnosis Date  . Allergic rhinitis due to pollen   . Anxiety state, unspecified   . Calculus of kidney   . Cataract   . Diaphragmatic hernia without mention of obstruction or gangrene   . Diffuse cystic mastopathy   . Dizziness and giddiness   . GERD (gastroesophageal reflux disease)   . Hiatal hernia   . Insomnia, unspecified   . Insomnia, unspecified   . Lumbago   . Macular degeneration (senile) of retina, unspecified   . Open wound of toe(s), without mention of complication   . Osteoarthrosis, unspecified whether generalized or localized, unspecified site   . Other and unspecified hyperlipidemia   . Personal history of fall   . Sciatica   . Sebaceous cyst   . Senile osteoporosis   . Unspecified  constipation   . Unspecified essential hypertension   . Unspecified hereditary and idiopathic peripheral neuropathy   . Unspecified hypothyroidism   . Unspecified tinnitus    Past Surgical History:  Procedure Laterality Date  . ABDOMINAL HYSTERECTOMY    . CATARACT EXTRACTION, BILATERAL    . CHOLECYSTECTOMY  07/25/2007  . TONSILLECTOMY AND ADENOIDECTOMY Bilateral 1943    FAMILY HISTORY Family History  Problem Relation Age of Onset  . Hypertension Brother   . Fibromyalgia Daughter   . Hypertension Son   . Hypertension Brother   . Hyperlipidemia Brother   . Breast cancer Neg Hx     SOCIAL HISTORY Social History   Tobacco Use  . Smoking status: Never Smoker  . Smokeless tobacco: Never Used  Vaping Use  . Vaping Use: Never used  Substance Use Topics  . Alcohol use: No    Alcohol/week: 0.0 standard drinks  . Drug use: No         OPHTHALMIC EXAM:  Base Eye Exam    Visual Acuity (ETDRS)      Right Left   Dist cc 20/80 CF at 3'   Dist ph cc NI 20/400   Correction: Glasses       Tonometry (Tonopen, 10:33 AM)      Right Left   Pressure 12 12       Pupils      Pupils Dark Light Shape React APD   Right PERRL 3 2 Round Brisk None   Left PERRL 3 2 Round Brisk None       Visual Fields (Counting fingers)      Left Right   Restrictions Partial outer superior temporal deficiency Partial outer superior nasal deficiency       Extraocular Movement      Right Left    Full Full       Neuro/Psych    Oriented x3: Yes   Mood/Affect: Normal       Dilation    Right eye: 1.0% Mydriacyl, 2.5% Phenylephrine @ 10:39 AM        Slit Lamp and Fundus Exam    External Exam      Right Left   External Normal Normal       Slit Lamp Exam      Right Left   Lids/Lashes Normal Normal   Conjunctiva/Sclera White and quiet White and quiet   Cornea Clear Clear   Anterior Chamber Deep and quiet Deep and quiet   Iris Round and reactive Round and reactive   Lens Posterior  chamber intraocular lens Posterior chamber intraocular lens   Anterior Vitreous Normal Normal       Fundus Exam      Right Left   Posterior Vitreous Posterior vitreous detachment    Disc Normal    C/D Ratio 0.0    Macula Geographic atrophy, Advanced age related macular degeneration, Disciform scar, Macular thickening, Subretinal neovascular membrane, Subretinal hemorrhage smaller temporal to the pannus., Exudates less active    Vessels Normal    Periphery Normal           IMAGING AND PROCEDURES  Imaging and Procedures for 06/24/20  OCT, Retina - OU - Both Eyes       Right Eye Quality was good. Scan locations included subfoveal. Central Foveal Thickness: 249.   Left Eye Quality was good. Scan locations included subfoveal. Central Foveal Thickness: 421.   Notes  Central foveal atrophy OD, with juxta foveal CNVM CME OD, repeat injection Avastin OD today  OS with encroachment of of large CNVM noted on OCT only, follow-up as scheduled OS       Intravitreal Injection, Pharmacologic Agent - OD - Right Eye       Time Out 06/24/2020. 11:29 AM. Confirmed correct patient, procedure, site, and patient consented.   Anesthesia Topical anesthesia was used.   Procedure Preparation included 10% betadine to eyelids, 5% betadine to ocular surface, Tobramycin 0.3%. A 30 gauge needle was used.   Injection:  1.25 mg Bevacizumab (AVASTIN) SOLN   NDC: 70360-001-02, Lot: [id number]   Route: Intravitreal, Site: Right Eye, Waste: 0 mg  Post-op Post injection exam found visual acuity of at least counting fingers. The patient tolerated the procedure well. There were no complications. Post injection medications were not given.                 ASSESSMENT/PLAN:  Exudative age-related macular degeneration of right eye with active choroidal neovascularization (HCC) Extra foveal CNVM temporal and superior Lee OD, active with CME and subretinal fluid but less active overall on  intravitreal Avastin, goal is to protect central acuity OD  Exudative age-related macular degeneration of left eye with active choroidal neovascularization (HCC) OS active lesion encroaching near the fovea, repeat examination as scheduled next      ICD-10-CM   1. Exudative age-related macular degeneration of right eye with active choroidal neovascularization (HCC)  H35.3211 OCT, Retina - OU - Both Eyes    Intravitreal Injection, Pharmacologic Agent - OD - Right Eye    Bevacizumab (AVASTIN) SOLN 1.25 mg  2. Exudative age-related macular degeneration of left eye with active choroidal neovascularization (HCC)  H35.3221     1.  Repeat injection intravitreal Avastin OD today at 6-week interval.  2.  Follow-up left eye as scheduled possible injection  3.  Ophthalmic Meds Ordered this visit:  Meds ordered this encounter  Medications  . Bevacizumab (AVASTIN) SOLN 1.25 mg       Return in about 6 weeks (around 08/05/2020) for dilate, OD, AVASTIN OCT.  There are no Patient Instructions on file for this visit.   Explained the diagnoses, plan, and follow up with the patient and they expressed understanding.  Patient expressed understanding of the importance of proper follow up care.   Alford Highland Arshad Oberholzer M.D. Diseases & Surgery of the Retina and Vitreous [facility] 06/24/20     Abbreviations: M myopia (nearsighted); A astigmatism; H hyperopia (farsighted); P presbyopia; Mrx spectacle prescription;  CTL contact lenses; OD right eye; OS left eye; OU  both eyes  XT exotropia; ET esotropia; PEK punctate epithelial keratitis; PEE punctate epithelial erosions; DES dry eye syndrome; MGD meibomian gland dysfunction; ATs artificial tears; PFAT's preservative free artificial tears; NSC nuclear sclerotic cataract; PSC posterior subcapsular cataract; ERM epi-retinal membrane; PVD posterior vitreous detachment; RD retinal detachment; DM diabetes mellitus; DR diabetic retinopathy; NPDR  non-proliferative diabetic retinopathy; PDR proliferative diabetic retinopathy; CSME clinically significant macular edema; DME diabetic macular edema; dbh dot blot hemorrhages; CWS cotton wool spot; POAG primary open angle glaucoma; C/D cup-to-disc ratio; HVF humphrey visual field; GVF goldmann visual field; OCT optical coherence tomography; IOP intraocular pressure; BRVO Branch retinal vein occlusion; CRVO central retinal vein occlusion; CRAO central retinal artery occlusion; BRAO branch retinal artery occlusion; RT retinal tear; SB scleral buckle; PPV pars plana vitrectomy; VH Vitreous hemorrhage; PRP panretinal laser photocoagulation; IVK intravitreal kenalog; VMT vitreomacular traction; MH Macular hole;  NVD neovascularization of the disc; NVE neovascularization elsewhere; AREDS age related eye disease study; ARMD age related macular degeneration; POAG primary open angle glaucoma; EBMD epithelial/anterior basement membrane dystrophy; ACIOL anterior chamber intraocular lens; IOL intraocular lens; PCIOL posterior chamber intraocular lens; Phaco/IOL phacoemulsification with intraocular lens placement; PRK photorefractive keratectomy; LASIK laser assisted in situ keratomileusis; HTN hypertension; DM diabetes mellitus; COPD chronic obstructive pulmonary disease

## 2020-06-26 ENCOUNTER — Other Ambulatory Visit: Payer: Self-pay | Admitting: Cardiology

## 2020-06-26 DIAGNOSIS — Z23 Encounter for immunization: Secondary | ICD-10-CM | POA: Diagnosis not present

## 2020-06-26 DIAGNOSIS — I10 Essential (primary) hypertension: Secondary | ICD-10-CM

## 2020-06-27 ENCOUNTER — Inpatient Hospital Stay (HOSPITAL_COMMUNITY)
Admission: EM | Admit: 2020-06-27 | Discharge: 2020-07-15 | DRG: 177 | Disposition: E | Payer: Medicare Other | Attending: Student | Admitting: Student

## 2020-06-27 ENCOUNTER — Emergency Department (HOSPITAL_COMMUNITY): Payer: Medicare Other

## 2020-06-27 ENCOUNTER — Other Ambulatory Visit: Payer: Self-pay

## 2020-06-27 ENCOUNTER — Encounter (HOSPITAL_COMMUNITY): Payer: Self-pay

## 2020-06-27 DIAGNOSIS — Z83438 Family history of other disorder of lipoprotein metabolism and other lipidemia: Secondary | ICD-10-CM

## 2020-06-27 DIAGNOSIS — G4700 Insomnia, unspecified: Secondary | ICD-10-CM | POA: Diagnosis present

## 2020-06-27 DIAGNOSIS — R0602 Shortness of breath: Secondary | ICD-10-CM | POA: Diagnosis not present

## 2020-06-27 DIAGNOSIS — Z881 Allergy status to other antibiotic agents status: Secondary | ICD-10-CM

## 2020-06-27 DIAGNOSIS — M797 Fibromyalgia: Secondary | ICD-10-CM | POA: Diagnosis present

## 2020-06-27 DIAGNOSIS — J9811 Atelectasis: Secondary | ICD-10-CM | POA: Diagnosis present

## 2020-06-27 DIAGNOSIS — Z888 Allergy status to other drugs, medicaments and biological substances status: Secondary | ICD-10-CM

## 2020-06-27 DIAGNOSIS — R0989 Other specified symptoms and signs involving the circulatory and respiratory systems: Secondary | ICD-10-CM | POA: Diagnosis not present

## 2020-06-27 DIAGNOSIS — R0902 Hypoxemia: Secondary | ICD-10-CM | POA: Diagnosis not present

## 2020-06-27 DIAGNOSIS — Z882 Allergy status to sulfonamides status: Secondary | ICD-10-CM

## 2020-06-27 DIAGNOSIS — Z20822 Contact with and (suspected) exposure to covid-19: Secondary | ICD-10-CM | POA: Diagnosis present

## 2020-06-27 DIAGNOSIS — F411 Generalized anxiety disorder: Secondary | ICD-10-CM | POA: Diagnosis present

## 2020-06-27 DIAGNOSIS — I10 Essential (primary) hypertension: Secondary | ICD-10-CM | POA: Diagnosis not present

## 2020-06-27 DIAGNOSIS — I5033 Acute on chronic diastolic (congestive) heart failure: Secondary | ICD-10-CM | POA: Diagnosis not present

## 2020-06-27 DIAGNOSIS — Z79890 Hormone replacement therapy (postmenopausal): Secondary | ICD-10-CM

## 2020-06-27 DIAGNOSIS — R1310 Dysphagia, unspecified: Secondary | ICD-10-CM | POA: Diagnosis present

## 2020-06-27 DIAGNOSIS — G9341 Metabolic encephalopathy: Secondary | ICD-10-CM | POA: Diagnosis present

## 2020-06-27 DIAGNOSIS — E785 Hyperlipidemia, unspecified: Secondary | ICD-10-CM | POA: Diagnosis present

## 2020-06-27 DIAGNOSIS — F09 Unspecified mental disorder due to known physiological condition: Secondary | ICD-10-CM | POA: Diagnosis not present

## 2020-06-27 DIAGNOSIS — R001 Bradycardia, unspecified: Secondary | ICD-10-CM | POA: Diagnosis not present

## 2020-06-27 DIAGNOSIS — M810 Age-related osteoporosis without current pathological fracture: Secondary | ICD-10-CM | POA: Diagnosis present

## 2020-06-27 DIAGNOSIS — Z885 Allergy status to narcotic agent status: Secondary | ICD-10-CM

## 2020-06-27 DIAGNOSIS — J449 Chronic obstructive pulmonary disease, unspecified: Secondary | ICD-10-CM | POA: Diagnosis not present

## 2020-06-27 DIAGNOSIS — Z7901 Long term (current) use of anticoagulants: Secondary | ICD-10-CM

## 2020-06-27 DIAGNOSIS — I480 Paroxysmal atrial fibrillation: Secondary | ICD-10-CM | POA: Diagnosis present

## 2020-06-27 DIAGNOSIS — J690 Pneumonitis due to inhalation of food and vomit: Principal | ICD-10-CM

## 2020-06-27 DIAGNOSIS — Z66 Do not resuscitate: Secondary | ICD-10-CM | POA: Diagnosis not present

## 2020-06-27 DIAGNOSIS — E876 Hypokalemia: Secondary | ICD-10-CM | POA: Diagnosis not present

## 2020-06-27 DIAGNOSIS — E039 Hypothyroidism, unspecified: Secondary | ICD-10-CM | POA: Diagnosis present

## 2020-06-27 DIAGNOSIS — J9601 Acute respiratory failure with hypoxia: Secondary | ICD-10-CM | POA: Diagnosis not present

## 2020-06-27 DIAGNOSIS — E875 Hyperkalemia: Secondary | ICD-10-CM | POA: Diagnosis present

## 2020-06-27 DIAGNOSIS — K5900 Constipation, unspecified: Secondary | ICD-10-CM | POA: Diagnosis present

## 2020-06-27 DIAGNOSIS — K219 Gastro-esophageal reflux disease without esophagitis: Secondary | ICD-10-CM | POA: Diagnosis present

## 2020-06-27 DIAGNOSIS — Z515 Encounter for palliative care: Secondary | ICD-10-CM

## 2020-06-27 DIAGNOSIS — E874 Mixed disorder of acid-base balance: Secondary | ICD-10-CM | POA: Diagnosis present

## 2020-06-27 DIAGNOSIS — I4891 Unspecified atrial fibrillation: Secondary | ICD-10-CM | POA: Diagnosis not present

## 2020-06-27 DIAGNOSIS — R0600 Dyspnea, unspecified: Secondary | ICD-10-CM

## 2020-06-27 DIAGNOSIS — J9602 Acute respiratory failure with hypercapnia: Secondary | ICD-10-CM | POA: Diagnosis present

## 2020-06-27 DIAGNOSIS — M1990 Unspecified osteoarthritis, unspecified site: Secondary | ICD-10-CM | POA: Diagnosis present

## 2020-06-27 DIAGNOSIS — E871 Hypo-osmolality and hyponatremia: Secondary | ICD-10-CM | POA: Diagnosis present

## 2020-06-27 DIAGNOSIS — J90 Pleural effusion, not elsewhere classified: Secondary | ICD-10-CM | POA: Diagnosis not present

## 2020-06-27 DIAGNOSIS — I110 Hypertensive heart disease with heart failure: Secondary | ICD-10-CM | POA: Diagnosis present

## 2020-06-27 DIAGNOSIS — Z79899 Other long term (current) drug therapy: Secondary | ICD-10-CM

## 2020-06-27 DIAGNOSIS — Z9889 Other specified postprocedural states: Secondary | ICD-10-CM

## 2020-06-27 DIAGNOSIS — Z8249 Family history of ischemic heart disease and other diseases of the circulatory system: Secondary | ICD-10-CM

## 2020-06-27 DIAGNOSIS — Z91013 Allergy to seafood: Secondary | ICD-10-CM

## 2020-06-27 DIAGNOSIS — G609 Hereditary and idiopathic neuropathy, unspecified: Secondary | ICD-10-CM | POA: Diagnosis present

## 2020-06-27 HISTORY — DX: Paroxysmal atrial fibrillation: I48.0

## 2020-06-27 LAB — I-STAT CHEM 8, ED
BUN: 17 mg/dL (ref 8–23)
Calcium, Ion: 1.12 mmol/L — ABNORMAL LOW (ref 1.15–1.40)
Chloride: 82 mmol/L — ABNORMAL LOW (ref 98–111)
Creatinine, Ser: 0.7 mg/dL (ref 0.44–1.00)
Glucose, Bld: 158 mg/dL — ABNORMAL HIGH (ref 70–99)
HCT: 44 % (ref 36.0–46.0)
Hemoglobin: 15 g/dL (ref 12.0–15.0)
Potassium: 3.7 mmol/L (ref 3.5–5.1)
Sodium: 123 mmol/L — ABNORMAL LOW (ref 135–145)
TCO2: 33 mmol/L — ABNORMAL HIGH (ref 22–32)

## 2020-06-27 LAB — COMPREHENSIVE METABOLIC PANEL
ALT: 24 U/L (ref 0–44)
AST: 24 U/L (ref 15–41)
Albumin: 4.1 g/dL (ref 3.5–5.0)
Alkaline Phosphatase: 115 U/L (ref 38–126)
Anion gap: 11 (ref 5–15)
BUN: 14 mg/dL (ref 8–23)
CO2: 31 mmol/L (ref 22–32)
Calcium: 9.3 mg/dL (ref 8.9–10.3)
Chloride: 82 mmol/L — ABNORMAL LOW (ref 98–111)
Creatinine, Ser: 0.58 mg/dL (ref 0.44–1.00)
GFR, Estimated: >60 mL/min (ref >60)
Glucose, Bld: 159 mg/dL — ABNORMAL HIGH (ref 70–99)
Potassium: 3.8 mmol/L (ref 3.5–5.1)
Sodium: 124 mmol/L — ABNORMAL LOW (ref 135–145)
Total Bilirubin: 0.6 mg/dL (ref 0.3–1.2)
Total Protein: 7.3 g/dL (ref 6.5–8.1)

## 2020-06-27 LAB — CBC WITH DIFFERENTIAL/PLATELET
Abs Immature Granulocytes: 0.05 10*3/uL (ref 0.00–0.07)
Basophils Absolute: 0 10*3/uL (ref 0.0–0.1)
Basophils Relative: 0 %
Eosinophils Absolute: 0 10*3/uL (ref 0.0–0.5)
Eosinophils Relative: 0 %
HCT: 41.3 % (ref 36.0–46.0)
Hemoglobin: 13.6 g/dL (ref 12.0–15.0)
Immature Granulocytes: 1 %
Lymphocytes Relative: 8 %
Lymphs Abs: 0.7 10*3/uL (ref 0.7–4.0)
MCH: 29.2 pg (ref 26.0–34.0)
MCHC: 32.9 g/dL (ref 30.0–36.0)
MCV: 88.8 fL (ref 80.0–100.0)
Monocytes Absolute: 0.6 10*3/uL (ref 0.1–1.0)
Monocytes Relative: 6 %
Neutro Abs: 8.1 10*3/uL — ABNORMAL HIGH (ref 1.7–7.7)
Neutrophils Relative %: 85 %
Platelets: 168 10*3/uL (ref 150–400)
RBC: 4.65 MIL/uL (ref 3.87–5.11)
RDW: 12.9 % (ref 11.5–15.5)
WBC: 9.4 10*3/uL (ref 4.0–10.5)
nRBC: 0 % (ref 0.0–0.2)

## 2020-06-27 LAB — I-STAT VENOUS BLOOD GAS, ED
Acid-Base Excess: 9 mmol/L — ABNORMAL HIGH (ref 0.0–2.0)
Bicarbonate: 38.2 mmol/L — ABNORMAL HIGH (ref 20.0–28.0)
Calcium, Ion: 1.12 mmol/L — ABNORMAL LOW (ref 1.15–1.40)
HCT: 43 % (ref 36.0–46.0)
Hemoglobin: 14.6 g/dL (ref 12.0–15.0)
O2 Saturation: 99 %
Potassium: 3.8 mmol/L (ref 3.5–5.1)
Sodium: 124 mmol/L — ABNORMAL LOW (ref 135–145)
TCO2: 40 mmol/L — ABNORMAL HIGH (ref 22–32)
pCO2, Ven: 70.1 mmHg (ref 44.0–60.0)
pH, Ven: 7.345 (ref 7.250–7.430)
pO2, Ven: 164 mmHg — ABNORMAL HIGH (ref 32.0–45.0)

## 2020-06-27 LAB — BRAIN NATRIURETIC PEPTIDE: B Natriuretic Peptide: 456.1 pg/mL — ABNORMAL HIGH (ref 0.0–100.0)

## 2020-06-27 LAB — TROPONIN I (HIGH SENSITIVITY): Troponin I (High Sensitivity): 4 ng/L (ref <18)

## 2020-06-27 MED ORDER — ALBUTEROL SULFATE HFA 108 (90 BASE) MCG/ACT IN AERS
2.0000 | INHALATION_SPRAY | Freq: Once | RESPIRATORY_TRACT | Status: AC
  Administered 2020-06-27: 2 via RESPIRATORY_TRACT
  Filled 2020-06-27: qty 6.7

## 2020-06-27 MED ORDER — MAGNESIUM SULFATE 2 GM/50ML IV SOLN
2.0000 g | Freq: Once | INTRAVENOUS | Status: AC
  Administered 2020-06-27: 2 g via INTRAVENOUS
  Filled 2020-06-27: qty 50

## 2020-06-27 NOTE — ED Triage Notes (Addendum)
Pt BIB [facility] from [place] for eval of SHOB & weakness. Pt reports she was trying to take at-home medication & "got choked up." Pt has hx of aspiration, per EMS bilateral lower lobes sound "junky," but top lobes clear. Pt denies hx of CHF, COPD, emphysema, endorses hx of afib, on eliquis.   125mg  solumedrol en route, 15L NRB on arrival, per EMS, pt O2 66% RA to 90's on NRB

## 2020-06-27 NOTE — ED Notes (Signed)
Pt decreased to 6L [place], O2 sats holding at 95%

## 2020-06-27 NOTE — H&P (Signed)
History and Physical    [name] OAC:[id number] DOB: [birth year] DOA: 07-18-2020  PCP: Chelsea Balo, DO  Patient coming from: [facility]  I have personally briefly reviewed patient's old medical records in [facility]  Chief Complaint: Shortness of breath  HPI: [name] is a 86 y.o. female with medical history significant for paroxysmal atrial fibrillation on Eliquis, hypertension, hypothyroidism, GERD, and anxiety who presents to the ED for evaluation of shortness of breath and cough.  Daughters at bedside to provide additional history.  Patient states that she is currently living in an independent living facility.  She has noticed over the last several days increasing dyspnea with exertion such as when walking to the dining area.  She says this is a new significant change for her.  She has had generalized weakness and recent weight loss.  She uses a walker to assist with ambulation and denies any recent falls.  She has not had any chest pain or palpitations.  She has not seen any swelling on her legs but does wear compression stockings.  She does not require supplemental oxygen as an outpatient.  This evening, patient states she was taking her afternoon medications which include Eliquis, Tylenol, and vitamin supplements when she choked on the pills and began to have nonproductive coughing fits.  She was having increased shortness of breath and EMS were called.  Per ED documentation, EMS reported SPO2 of 66% on room air.  She was placed on NRB with improved SPO2 in the 90s.  She was given 125 mg IV Solu-Medrol on route to the ED.  Patient states that she does have frequent issues with choking on her pills, most notably on her potassium supplement which she takes every morning.  She otherwise denies any subjective fevers, chills, diaphoresis, nausea, vomiting, abdominal pain, diarrhea, constipation, or dysuria.  ED Course:  Initial vitals showed BP  152/96, pulse 84, RR 18, temp 98.3 Fahrenheit, SPO2 96% on nonrebreather.  Patient was weaned down to 6 L of O2 via [place].  Labs show sodium 124, potassium 3.8, bicarb 31, BUN 14, creatinine 0.58, serum glucose 159, LFTs within normal limits, WBC 9.4, hemoglobin 13.6, platelets 168,000, high-sensitivity troponin I 4.  BNP 456.1.  VBG showed pH 7.345, PCO2 70.1, PO2 99.  SARS-CoV-2 PCR panel was obtained and pending.  2 view chest x-ray showed right greater than left bilateral pleural effusions with associated compressive atelectasis.  X-ray limited due to rotation.  Patient was given albuterol inhaler treatment and 2 g IV magnesium.  The hospitalist service was consulted to admit for further evaluation and management.  Review of Systems: All systems reviewed and are negative except as documented in history of present illness above.   Past Medical History:  Diagnosis Date  . Allergic rhinitis due to pollen   . Anxiety state, unspecified   . Calculus of kidney   . Cataract   . Diaphragmatic hernia without mention of obstruction or gangrene   . Diffuse cystic mastopathy   . Dizziness and giddiness   . GERD (gastroesophageal reflux disease)   . Hiatal hernia   . Insomnia, unspecified   . Insomnia, unspecified   . Lumbago   . Macular degeneration (senile) of retina, unspecified   . Open wound of toe(s), without mention of complication   . Osteoarthrosis, unspecified whether generalized or localized, unspecified site   . Other and unspecified hyperlipidemia   . PAF (paroxysmal atrial fibrillation) (HCC)   . Personal history  of fall   . Sciatica   . Sebaceous cyst   . Senile osteoporosis   . Unspecified constipation   . Unspecified essential hypertension   . Unspecified hereditary and idiopathic peripheral neuropathy   . Unspecified hypothyroidism   . Unspecified tinnitus     Past Surgical History:  Procedure Laterality Date  . ABDOMINAL HYSTERECTOMY    . CATARACT EXTRACTION,  BILATERAL    . CHOLECYSTECTOMY  07/25/2007  . TONSILLECTOMY AND ADENOIDECTOMY Bilateral 1943    Social History:  reports that she has never smoked. She has never used smokeless tobacco. She reports that she does not drink alcohol and does not use drugs.  Allergies  Allergen Reactions  . Crab (Diagnostic) Shortness Of Breath and Other (See Comments)    Patient "swells"  . Ambien [Zolpidem Tartrate]     hallucination  . Bactrim [Sulfamethoxazole-Trimethoprim] Other (See Comments)    Unknown reaction   . Morphine And Related Nausea And Vomiting  . Shellfish-Derived Products Other (See Comments)    Patient can maybe tolerate shrimp  . Zithromax [Azithromycin] Other (See Comments)    Not effective      Family History  Problem Relation Age of Onset  . Hypertension Brother   . Fibromyalgia Daughter   . Hypertension Son   . Hypertension Brother   . Hyperlipidemia Brother   . Breast cancer Neg Hx      Prior to Admission medications   Medication Sig Start Date End Date Taking? Authorizing Provider  acetaminophen (TYLENOL) 500 MG tablet Take 500 mg by mouth in the morning and at bedtime.     [provider]  ALPRAZolam Prudy Feeler) 0.5 MG tablet TAKE 1 TABLET BY MOUTH THREE TIMES A DAY AS NEEDED FOR ANXIETY 05/19/20   Reed, Tiffany L, DO  amLODipine (NORVASC) 10 MG tablet Take 1 tablet (10 mg total) by mouth every evening. 02/06/20 08/04/20  Tolia, Sunit, DO  ELIQUIS 2.5 MG TABS tablet TAKE 1 TABLET BY MOUTH TWICE A DAY 03/12/20   Tolia, Sunit, DO  hydrochlorothiazide (MICROZIDE) 12.5 MG capsule TAKE 1 CAPSULE (12.5 MG TOTAL) BY MOUTH IN THE MORNING. 06/26/20 09/24/20  Tolia, Sunit, DO  irbesartan (AVAPRO) 150 MG tablet Take 1 tablet (150 mg total) by mouth daily. 01/16/20   Tolia, Sunit, DO  KLOR-CON M20 20 MEQ tablet TAKE 1 TABLET BY MOUTH EVERY DAY 06/04/20   Reed, Tiffany L, DO  levothyroxine (SYNTHROID) 100 MCG tablet TAKE 1 TABLET BY MOUTH EVERY DAY BEFORE BREAKFAST 02/26/20    Reed, Tiffany L, DO  loratadine (CLARITIN) 10 MG tablet Take 10 mg by mouth daily.     [provider]  metoprolol succinate (TOPROL XL) 100 MG 24 hr tablet Take 1 tablet (100 mg total) by mouth daily. Hold if systolic blood pressure (top blood pressure number) less than 100 mmHg or heart rate less than 60 bpm (pulse). 02/06/20 08/04/20  Tolia, Sunit, DO  pantoprazole (PROTONIX) 40 MG tablet TAKE 1 TABLET BY MOUTH EVERY DAY 03/23/20   Reed, Tiffany L, DO  polyethylene glycol (MIRALAX / GLYCOLAX) packet Take 17 g by mouth daily.     [provider]  Propylene Glycol (SYSTANE BALANCE) 0.6 % SOLN Place 1 drop into both eyes 2 (two) times daily.    [provider]  sertraline (ZOLOFT) 50 MG tablet Take one tablet by mouth once daily to calm nerves. 03/31/20   Reed, Tiffany L, DO  temazepam (RESTORIL) 30 MG capsule TAKE 1 CAPSULE BY MOUTH AT  BEDTIME AS NEEDED FOR SLEEP 06/11/20   Chelsea Balo, DO    Physical Exam: Vitals:   07-01-2020 2148 07/01/2020 2149 2020/07/01 2300 06/28/20 0000  BP: (!) 152/96  (!) 147/97 (!) 145/87  Pulse: 66  (!) 46 68  Resp: 18  17 15   Temp: 98.3 F (36.8 C)     TempSrc: Oral     SpO2: 96%  100% 100%  Weight:  52.2 kg    Height:  5\' 8"  (1.727 m)     Constitutional: Frail elderly woman resting in bed with head elevated, NAD, calm, comfortable Eyes: PERRL, lids and conjunctivae normal ENMT: Mucous membranes are moist. Posterior pharynx clear of any exudate or lesions.multiple prior dental crowns.  Neck: normal, supple, no masses. Respiratory: Expiratory wheeze/rhonchorous breath sounds throughout the lung fields, more prominent in the lower lungs.  Normal respiratory effort. No accessory muscle use.  Cardiovascular: Irregularly irregular, no murmurs / rubs / gallops. No extremity edema. 2+ pedal pulses. Abdomen: no tenderness, no masses palpated. No hepatosplenomegaly. Bowel sounds positive.  Musculoskeletal: no clubbing / cyanosis. No joint  deformity upper and lower extremities. Good ROM, no contractures. Normal muscle tone.  Skin: no rashes, lesions, ulcers. No induration Neurologic: CN 2-12 grossly intact. Sensation intact, Strength 5/5 in all 4.  Psychiatric: Normal judgment and insight. Alert and oriented x 3. Normal mood.    Labs on Admission: I have personally reviewed following labs and imaging studies  CBC: Recent Labs  Lab 07-01-2020 2232 07/01/20 2241 01-Jul-2020 2246  WBC 9.4  --   --   NEUTROABS 8.1*  --   --   HGB 13.6 15.0 14.6  HCT 41.3 44.0 43.0  MCV 88.8  --   --   PLT 168  --   --    Basic Metabolic Panel: Recent Labs  Lab 01-Jul-2020 2232 Jul 01, 2020 2241 07/01/2020 2246  NA 124* 123* 124*  K 3.8 3.7 3.8  CL 82* 82*  --   CO2 31  --   --   GLUCOSE 159* 158*  --   BUN 14 17  --   CREATININE 0.58 0.70  --   CALCIUM 9.3  --   --    GFR: Estimated Creatinine Clearance: 40.8 mL/min (by C-G formula based on SCr of 0.7 mg/dL). Liver Function Tests: Recent Labs  Lab 07/01/2020 2232  AST 24  ALT 24  ALKPHOS 115  BILITOT 0.6  PROT 7.3  ALBUMIN 4.1   No results for input(s): LIPASE, AMYLASE in the last 168 hours. No results for input(s): AMMONIA in the last 168 hours. Coagulation Profile: No results for input(s): INR, PROTIME in the last 168 hours. Cardiac Enzymes: No results for input(s): CKTOTAL, CKMB, CKMBINDEX, TROPONINI in the last 168 hours. BNP (last 3 results) No results for input(s): PROBNP in the last 8760 hours. HbA1C: No results for input(s): HGBA1C in the last 72 hours. CBG: No results for input(s): GLUCAP in the last 168 hours. Lipid Profile: No results for input(s): CHOL, HDL, LDLCALC, TRIG, CHOLHDL, LDLDIRECT in the last 72 hours. Thyroid Function Tests: No results for input(s): TSH, T4TOTAL, FREET4, T3FREE, THYROIDAB in the last 72 hours. Anemia Panel: No results for input(s): VITAMINB12, FOLATE, FERRITIN, TIBC, IRON, RETICCTPCT in the last 72 hours. Urine analysis:      Component Value Date/Time   COLORURINE YELLOW 11/17/2019 1253   APPEARANCEUR CLOUDY (A) 11/17/2019 1253   LABSPEC 1.006 11/17/2019 1253   PHURINE 8.0 11/17/2019 1253   GLUCOSEU NEGATIVE 11/17/2019  1253   HGBUR NEGATIVE 11/17/2019 1253   BILIRUBINUR NEGATIVE 11/17/2019 1253   KETONESUR NEGATIVE 11/17/2019 1253   PROTEINUR NEGATIVE 11/17/2019 1253   UROBILINOGEN 1.0 07/22/2007 2239   NITRITE NEGATIVE 11/17/2019 1253   LEUKOCYTESUR NEGATIVE 11/17/2019 1253    Radiological Exams on Admission: DG Chest 2 View  Result Date: Jul 12, 2020 CLINICAL DATA:  Dyspnea EXAM: CHEST - 2 VIEW COMPARISON:  11/17/2019 FINDINGS: Moderate right pleural effusion has developed with compressive atelectasis of the right lung base. Small left pleural effusion has developed. The lungs are hyperinflated in keeping with underlying COPD. No pneumothorax. Cardiac size is within normal limits when accounting for moderate rotation on this examination. Pulmonary vascularity is normal. No acute bone abnormality. IMPRESSION: Interval development of bilateral pleural effusions, right greater than left, with associated compressive atelectasis. Electronically Signed   By: Helyn Numbers MD   On: 07-12-20 22:42    EKG: Personally reviewed. Atrial fibrillation, rate 84, PVC present.  Previous EKG showed normal sinus rhythm.  Assessment/Plan Principal Problem:   Acute respiratory failure with hypoxia and hypercapnia (HCC) Active Problems:   Hypothyroidism, adult   Essential hypertension   Anxiety state   Acute on chronic diastolic CHF (congestive heart failure) (HCC)   Pleural effusion, right   PAF (paroxysmal atrial fibrillation) (HCC)   Hyponatremia  [name] is a 86 y.o. female with medical history significant for paroxysmal atrial fibrillation on Eliquis, hypertension, hypothyroidism, GERD, and anxiety who is admitted with acute respiratory failure with hypoxia and hypercapnia.  Acute respiratory failure with  hypoxia and hypercapnia Acute on chronic diastolic CHF: TTE 12/20/2019 showed EF 55-60% with G2DD, mild MR and TR, severely dilated LA. Suspect patient's progressive dyspnea on exertion and pleural effusion related to diastolic CHF. -Give IV Lasix 20 mg once now and monitor response -Supplement potassium with oral solution -Update echocardiogram -Continue supplemental oxygen and wean as able, down to 4 L O2 via [place] during examination on admission  Pleural effusions: Chest x-ray shows interval development of bilateral pleural effusions, right greater than left.  Likely related to CHF.  Trial of diuresis as above.  Hyponatremia: Sodium 124 on admission.  Likely multifactorial from CHF and medication effect from HCTZ.  Could also be SIADH related to sertraline use.  Trialing Lasix as above.  Check serum and urine osmolality, urine sodium.  Pill dysphagia: Patient reports frequent dysphagia with home medications, notably potassium and another vitamin supplement.  May have had an aspiration episode prior to arrival but does not appear to have related pneumonia. -Switch potassium supplement to oral solution -SLP eval  Paroxysmal atrial fibrillation: CHA2DS2-VASc score is 5.  Remains in atrial fibrillation on admission with controlled rate. -Continue Eliquis 2.5 mg twice daily -Continue Toprol-XL 100 mg daily  Hypertension: Currently stable. -Continue amlodipine 10 mg daily, irbesartan 150 mg daily, Toprol-XL 100 mg daily -Holding HCTZ in setting of hyponatremia  Hypothyroidism: Continue Synthroid, check TSH.  Anxiety: Continue home Xanax as needed.  Holding sertraline for now.  Generalized weakness: -PT/OT eval  DVT prophylaxis: Eliquis Code Status: DNR, confirmed with patient Family Communication: Discussed with patient's daughter at bedside Disposition Plan: From [facility] and likely return to same facility pending improvement in respiratory status, adequate  diuresis, and PT/OT/SLP eval Consults called: None Admission status:  Status is: Observation  The patient remains OBS appropriate and will d/c before 2 midnights.  Dispo: The patient is from: Independent living  Anticipated d/c is to: Independent living facility              Anticipated d/c date is: 1 day              Patient currently is not medically stable to d/c.    Darreld McleanVishal Ashiah Karpowicz MD [facility]  If 7PM-7AM, please contact night-coverage [email or web address]  06/28/2020, 12:23 AM

## 2020-06-27 NOTE — ED Provider Notes (Signed)
[facility] EMERGENCY DEPARTMENT Provider Note   CSN: [id number] Arrival date & time: 06/24/2020  2138     History Chief Complaint  Patient presents with  . Shortness of Breath    [name] is a 86 y.o. female hx of GERD, fibromyalgia, here presenting with shortness of breath.  Patient states that she took her evening meds and states that she may have choked on it.  She then had some wheezing afterwards.  Patient has a history of A. Fib  and is currently on Eliquis.  Patient was noted to be hypoxic was put on a nonrebreather.  EMS also gave her 125 mg Solu-Medrol.  Patient has no history of heart failure or COPD.  EMS was considered for possible COPD exacerbation.  Patient had multiple negative Covid tests.  The history is provided by the patient.       Past Medical History:  Diagnosis Date  . Allergic rhinitis due to pollen   . Anxiety state, unspecified   . Calculus of kidney   . Cataract   . Diaphragmatic hernia without mention of obstruction or gangrene   . Diffuse cystic mastopathy   . Dizziness and giddiness   . GERD (gastroesophageal reflux disease)   . Hiatal hernia   . Insomnia, unspecified   . Insomnia, unspecified   . Lumbago   . Macular degeneration (senile) of retina, unspecified   . Open wound of toe(s), without mention of complication   . Osteoarthrosis, unspecified whether generalized or localized, unspecified site   . Other and unspecified hyperlipidemia   . Personal history of fall   . Sciatica   . Sebaceous cyst   . Senile osteoporosis   . Unspecified constipation   . Unspecified essential hypertension   . Unspecified hereditary and idiopathic peripheral neuropathy   . Unspecified hypothyroidism   . Unspecified tinnitus     Patient Active Problem List   Diagnosis Date Noted  . Exudative age-related macular degeneration of left eye with active choroidal neovascularization (HCC) 12/11/2019  . Advanced nonexudative age-related  macular degeneration of left eye with subfoveal involvement 12/11/2019  . Exudative age-related macular degeneration of right eye with inactive choroidal neovascularization (HCC) 12/11/2019  . Advanced nonexudative age-related macular degeneration of right eye without subfoveal involvement 12/11/2019  . Exudative age-related macular degeneration of right eye with active choroidal neovascularization (HCC) 12/11/2019  . Generalized OA 09/02/2019  . Osteoporosis 06/20/2017  . 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 Iron deficiency anemia secondary to inadequate dietary iron intake 02/10/2017  . Facial asymmetry 11/09/2016  . Incontinence 09/22/2016  . Hypoxia 08/02/2016  . Hypokalemia 08/02/2016  . Lumbar spondylolysis 04/13/2016  . Bilateral knee pain 12/08/2015  . Abnormality of gait 08/11/2015  . Edema 08/11/2015  . Bunion 08/11/2015  . Anxiety state 03/10/2015  . Palpitations 02/18/2015  . GERD (gastroesophageal reflux disease) 11/05/2014  . Hypothyroidism, adult   . Hyperlipidemia   . Essential hypertension   . Macular degeneration (senile) of retina   . Diaphragmatic hernia   . Insomnia   . Hereditary and idiopathic peripheral neuropathy   . Low back pain     Past Surgical History:  Procedure Laterality Date  . ABDOMINAL HYSTERECTOMY    . CATARACT EXTRACTION, BILATERAL    . CHOLECYSTECTOMY  07/25/2007  . TONSILLECTOMY AND ADENOIDECTOMY Bilateral 1943     OB History   No obstetric history on file.     Family History  Problem Relation Age of Onset  .  Hypertension Brother   . Fibromyalgia Daughter   . Hypertension Son   . Hypertension Brother   . Hyperlipidemia Brother   . Breast cancer Neg Hx     Social History   Tobacco Use  . Smoking status: Never Smoker  . Smokeless tobacco: Never Used  Vaping Use  . Vaping Use: Never used  Substance Use Topics  . Alcohol use: No    Alcohol/week: 0.0 standard drinks  . Drug use: No    Home Medications Prior to  Admission medications   Medication Sig Start Date End Date Taking? Authorizing Provider  acetaminophen (TYLENOL) 500 MG tablet Take 500 mg by mouth in the morning and at bedtime.     [provider]  ALPRAZolam Prudy Feeler(XANAX) 0.5 MG tablet TAKE 1 TABLET BY MOUTH THREE TIMES A DAY AS NEEDED FOR ANXIETY 05/19/20   Reed, Tiffany L, DO  amLODipine (NORVASC) 10 MG tablet Take 1 tablet (10 mg total) by mouth every evening. 02/06/20 08/04/20  Tolia, Sunit, DO  ELIQUIS 2.5 MG TABS tablet TAKE 1 TABLET BY MOUTH TWICE A DAY 03/12/20   Tolia, Sunit, DO  hydrochlorothiazide (MICROZIDE) 12.5 MG capsule TAKE 1 CAPSULE (12.5 MG TOTAL) BY MOUTH IN THE MORNING. 06/26/20 09/24/20  Tolia, Sunit, DO  irbesartan (AVAPRO) 150 MG tablet Take 1 tablet (150 mg total) by mouth daily. 01/16/20   Tolia, Sunit, DO  KLOR-CON M20 20 MEQ tablet TAKE 1 TABLET BY MOUTH EVERY DAY 06/04/20   Reed, Tiffany L, DO  levothyroxine (SYNTHROID) 100 MCG tablet TAKE 1 TABLET BY MOUTH EVERY DAY BEFORE BREAKFAST 02/26/20   Reed, Tiffany L, DO  loratadine (CLARITIN) 10 MG tablet Take 10 mg by mouth daily.     [provider]  metoprolol succinate (TOPROL XL) 100 MG 24 hr tablet Take 1 tablet (100 mg total) by mouth daily. Hold if systolic blood pressure (top blood pressure number) less than 100 mmHg or heart rate less than 60 bpm (pulse). 02/06/20 08/04/20  Tolia, Sunit, DO  pantoprazole (PROTONIX) 40 MG tablet TAKE 1 TABLET BY MOUTH EVERY DAY 03/23/20   Reed, Tiffany L, DO  polyethylene glycol (MIRALAX / GLYCOLAX) packet Take 17 g by mouth daily.     [provider]  Propylene Glycol (SYSTANE BALANCE) 0.6 % SOLN Place 1 drop into both eyes 2 (two) times daily.    [provider]  sertraline (ZOLOFT) 50 MG tablet Take one tablet by mouth once daily to calm nerves. 03/31/20   Reed, Tiffany L, DO  temazepam (RESTORIL) 30 MG capsule TAKE 1 CAPSULE BY MOUTH AT BEDTIME AS NEEDED FOR SLEEP 06/11/20   Reed, Tiffany L, DO     Allergies    Ambien [zolpidem tartrate], Bactrim [sulfamethoxazole-trimethoprim], Morphine and related, and Zithromax [azithromycin]  Review of Systems   Review of Systems  Respiratory: Positive for shortness of breath.   All other systems reviewed and are negative.   Physical Exam Updated Vital Signs BP (!) 147/97   Pulse (!) 46   Temp 98.3 F (36.8 C) (Oral)   Resp 17   Ht 5\' 8"  (1.727 m)   Wt 52.2 kg   SpO2 100%   BMI 17.50 kg/m   Physical Exam Vitals and nursing note reviewed.  Constitutional:      Comments: Tachypneic.  HENT:     Head: Normocephalic.  Eyes:     Pupils: Pupils are equal, round, and reactive to light.  Cardiovascular:     Rate and Rhythm: Regular rhythm.  Tachycardia present.  Pulmonary:     Comments: Wheezing throughout but no retractions Abdominal:     General: Bowel sounds are normal.     Palpations: Abdomen is soft.  Musculoskeletal:        General: Normal range of motion.     Cervical back: Normal range of motion.  Skin:    General: Skin is warm.     Capillary Refill: Capillary refill takes less than 2 seconds.  Neurological:     General: No focal deficit present.     Mental Status: She is oriented to person, place, and time.  Psychiatric:        Mood and Affect: Mood normal.        Behavior: Behavior normal.     ED Results / Procedures / Treatments   Labs (all labs ordered are listed, but only abnormal results are displayed) Labs Reviewed  CBC WITH DIFFERENTIAL/PLATELET - Abnormal; Notable for the following components:      Result Value   Neutro Abs 8.1 (*)    All other components within normal limits  I-STAT CHEM 8, ED - Abnormal; Notable for the following components:   Sodium 123 (*)    Chloride 82 (*)    Glucose, Bld 158 (*)    Calcium, Ion 1.12 (*)    TCO2 33 (*)    All other components within normal limits  I-STAT VENOUS BLOOD GAS, ED - Abnormal; Notable for the following components:   pCO2, Ven 70.1 (*)    pO2,  Ven 164.0 (*)    Bicarbonate 38.2 (*)    TCO2 40 (*)    Acid-Base Excess 9.0 (*)    Sodium 124 (*)    Calcium, Ion 1.12 (*)    All other components within normal limits  COMPREHENSIVE METABOLIC PANEL  BRAIN NATRIURETIC PEPTIDE  TROPONIN I (HIGH SENSITIVITY)    EKG EKG Interpretation  Date/Time:  Saturday June 27 2020 21:46:05 EST Ventricular Rate:  84 PR Interval:    QRS Duration: 86 QT Interval:  372 QTC Calculation: 440 R Axis:   71 Text Interpretation: Atrial fibrillation Anterior infarct, old Nonspecific repol abnormality, lateral leads No significant change since last tracing Confirmed by Richardean Canal [phone]) on 07/09/2020 10:03:08 PM   Radiology DG Chest 2 View  Result Date: 06/25/2020 CLINICAL DATA:  Dyspnea EXAM: CHEST - 2 VIEW COMPARISON:  11/17/2019 FINDINGS: Moderate right pleural effusion has developed with compressive atelectasis of the right lung base. Small left pleural effusion has developed. The lungs are hyperinflated in keeping with underlying COPD. No pneumothorax. Cardiac size is within normal limits when accounting for moderate rotation on this examination. Pulmonary vascularity is normal. No acute bone abnormality. IMPRESSION: Interval development of bilateral pleural effusions, right greater than left, with associated compressive atelectasis. Electronically Signed   By: Helyn Numbers MD   On: 06/16/2020 22:42    Procedures Procedures (including critical care time)  CRITICAL CARE Performed by: Richardean Canal   Total critical care time: 30 minutes  Critical care time was exclusive of separately billable procedures and treating other patients.  Critical care was necessary to treat or prevent imminent or life-threatening deterioration.  Critical care was time spent personally by me on the following activities: development of treatment plan with patient and/or surrogate as well as nursing, discussions with consultants, evaluation of patient's response  to treatment, examination of patient, obtaining history from patient or surrogate, ordering and performing treatments and interventions, ordering and review of laboratory studies,  ordering and review of radiographic studies, pulse oximetry and re-evaluation of patient's condition.   Medications Ordered in ED Medications  magnesium sulfate IVPB 2 g 50 mL (2 g Intravenous New Bag/Given 07/09/2020 2256)  albuterol (VENTOLIN HFA) 108 (90 Base) MCG/ACT inhaler 2 puff (2 puffs Inhalation Given 06/25/2020 2254)    ED Course  I have reviewed the triage vital signs and the nursing notes.  Pertinent labs & imaging results that were available during my care of the patient were reviewed by me and considered in my medical decision making (see chart for details).    MDM Rules/Calculators/A&P                         KEMORA PINARD is a 86 y.o. female presenting with shortness of breath.  Patient has diffuse wheezing. Patient likely aspirated.  Patient is hypoxic.  She was initially on nonrebreather and I was able to titrate her down on 5 L.  Plan to get VBG and labs and chest x-ray and Covid test.  11:19 PM VBG showed pH of 7.34 and PCO2 of 70.  Patient has partially compensated respiratory acidosis.  X-ray showed bilateral pleural effusions.  I think she likely aspirated and will need to be admitted for hypoxia.  Given magnesium albuterol.  Covid test is pending.  Hospitalist to admit.     Final Clinical Impression(s) / ED Diagnoses Final diagnoses:  None    Rx / DC Orders ED Discharge Orders    None       Charlynne Pander, MD 07/03/2020 2326

## 2020-06-28 ENCOUNTER — Observation Stay (HOSPITAL_COMMUNITY): Payer: Medicare Other

## 2020-06-28 DIAGNOSIS — E039 Hypothyroidism, unspecified: Secondary | ICD-10-CM | POA: Diagnosis present

## 2020-06-28 DIAGNOSIS — I110 Hypertensive heart disease with heart failure: Secondary | ICD-10-CM | POA: Diagnosis present

## 2020-06-28 DIAGNOSIS — J181 Lobar pneumonia, unspecified organism: Secondary | ICD-10-CM | POA: Diagnosis not present

## 2020-06-28 DIAGNOSIS — K5900 Constipation, unspecified: Secondary | ICD-10-CM | POA: Diagnosis present

## 2020-06-28 DIAGNOSIS — Z7189 Other specified counseling: Secondary | ICD-10-CM | POA: Diagnosis not present

## 2020-06-28 DIAGNOSIS — I5031 Acute diastolic (congestive) heart failure: Secondary | ICD-10-CM

## 2020-06-28 DIAGNOSIS — I480 Paroxysmal atrial fibrillation: Secondary | ICD-10-CM | POA: Diagnosis present

## 2020-06-28 DIAGNOSIS — F411 Generalized anxiety disorder: Secondary | ICD-10-CM | POA: Diagnosis present

## 2020-06-28 DIAGNOSIS — R0602 Shortness of breath: Secondary | ICD-10-CM | POA: Diagnosis not present

## 2020-06-28 DIAGNOSIS — J9811 Atelectasis: Secondary | ICD-10-CM | POA: Diagnosis not present

## 2020-06-28 DIAGNOSIS — Z888 Allergy status to other drugs, medicaments and biological substances status: Secondary | ICD-10-CM | POA: Diagnosis not present

## 2020-06-28 DIAGNOSIS — Z91013 Allergy to seafood: Secondary | ICD-10-CM | POA: Diagnosis not present

## 2020-06-28 DIAGNOSIS — J9601 Acute respiratory failure with hypoxia: Secondary | ICD-10-CM | POA: Diagnosis not present

## 2020-06-28 DIAGNOSIS — Z881 Allergy status to other antibiotic agents status: Secondary | ICD-10-CM | POA: Diagnosis not present

## 2020-06-28 DIAGNOSIS — K219 Gastro-esophageal reflux disease without esophagitis: Secondary | ICD-10-CM | POA: Diagnosis present

## 2020-06-28 DIAGNOSIS — R5381 Other malaise: Secondary | ICD-10-CM | POA: Diagnosis not present

## 2020-06-28 DIAGNOSIS — J690 Pneumonitis due to inhalation of food and vomit: Secondary | ICD-10-CM | POA: Diagnosis not present

## 2020-06-28 DIAGNOSIS — I10 Essential (primary) hypertension: Secondary | ICD-10-CM | POA: Diagnosis not present

## 2020-06-28 DIAGNOSIS — G9341 Metabolic encephalopathy: Secondary | ICD-10-CM | POA: Diagnosis not present

## 2020-06-28 DIAGNOSIS — J984 Other disorders of lung: Secondary | ICD-10-CM | POA: Diagnosis not present

## 2020-06-28 DIAGNOSIS — E871 Hypo-osmolality and hyponatremia: Secondary | ICD-10-CM | POA: Diagnosis not present

## 2020-06-28 DIAGNOSIS — Z882 Allergy status to sulfonamides status: Secondary | ICD-10-CM | POA: Diagnosis not present

## 2020-06-28 DIAGNOSIS — J189 Pneumonia, unspecified organism: Secondary | ICD-10-CM | POA: Diagnosis not present

## 2020-06-28 DIAGNOSIS — Z20822 Contact with and (suspected) exposure to covid-19: Secondary | ICD-10-CM | POA: Diagnosis not present

## 2020-06-28 DIAGNOSIS — I2510 Atherosclerotic heart disease of native coronary artery without angina pectoris: Secondary | ICD-10-CM | POA: Diagnosis not present

## 2020-06-28 DIAGNOSIS — G4700 Insomnia, unspecified: Secondary | ICD-10-CM | POA: Diagnosis present

## 2020-06-28 DIAGNOSIS — I5033 Acute on chronic diastolic (congestive) heart failure: Secondary | ICD-10-CM | POA: Diagnosis not present

## 2020-06-28 DIAGNOSIS — Z515 Encounter for palliative care: Secondary | ICD-10-CM | POA: Diagnosis not present

## 2020-06-28 DIAGNOSIS — I517 Cardiomegaly: Secondary | ICD-10-CM | POA: Diagnosis not present

## 2020-06-28 DIAGNOSIS — E874 Mixed disorder of acid-base balance: Secondary | ICD-10-CM | POA: Diagnosis not present

## 2020-06-28 DIAGNOSIS — J9602 Acute respiratory failure with hypercapnia: Secondary | ICD-10-CM | POA: Diagnosis not present

## 2020-06-28 DIAGNOSIS — R0902 Hypoxemia: Secondary | ICD-10-CM | POA: Diagnosis not present

## 2020-06-28 DIAGNOSIS — Z66 Do not resuscitate: Secondary | ICD-10-CM | POA: Diagnosis not present

## 2020-06-28 DIAGNOSIS — J90 Pleural effusion, not elsewhere classified: Secondary | ICD-10-CM | POA: Diagnosis not present

## 2020-06-28 DIAGNOSIS — E875 Hyperkalemia: Secondary | ICD-10-CM | POA: Diagnosis present

## 2020-06-28 LAB — BASIC METABOLIC PANEL
Anion gap: 11 (ref 5–15)
Anion gap: 9 (ref 5–15)
BUN: 14 mg/dL (ref 8–23)
BUN: 15 mg/dL (ref 8–23)
CO2: 33 mmol/L — ABNORMAL HIGH (ref 22–32)
CO2: 34 mmol/L — ABNORMAL HIGH (ref 22–32)
Calcium: 9.2 mg/dL (ref 8.9–10.3)
Calcium: 9.3 mg/dL (ref 8.9–10.3)
Chloride: 81 mmol/L — ABNORMAL LOW (ref 98–111)
Chloride: 80 mmol/L — ABNORMAL LOW (ref 98–111)
Creatinine, Ser: 0.6 mg/dL (ref 0.44–1.00)
Creatinine, Ser: 0.63 mg/dL (ref 0.44–1.00)
GFR, Estimated: >60 mL/min (ref >60)
GFR, Estimated: >60 mL/min (ref >60)
Glucose, Bld: 154 mg/dL — ABNORMAL HIGH (ref 70–99)
Glucose, Bld: 179 mg/dL — ABNORMAL HIGH (ref 70–99)
Potassium: 3.9 mmol/L (ref 3.5–5.1)
Potassium: 3.9 mmol/L (ref 3.5–5.1)
Sodium: 125 mmol/L — ABNORMAL LOW (ref 135–145)
Sodium: 123 mmol/L — ABNORMAL LOW (ref 135–145)

## 2020-06-28 LAB — SODIUM, URINE, RANDOM: Sodium, Ur: 11 mmol/L

## 2020-06-28 LAB — CBC
HCT: 41.5 % (ref 36.0–46.0)
Hemoglobin: 13.7 g/dL (ref 12.0–15.0)
MCH: 29.4 pg (ref 26.0–34.0)
MCHC: 33 g/dL (ref 30.0–36.0)
MCV: 89.1 fL (ref 80.0–100.0)
Platelets: 152 10*3/uL (ref 150–400)
RBC: 4.66 MIL/uL (ref 3.87–5.11)
RDW: 12.8 % (ref 11.5–15.5)
WBC: 12.2 10*3/uL — ABNORMAL HIGH (ref 4.0–10.5)
nRBC: 0 % (ref 0.0–0.2)

## 2020-06-28 LAB — RESPIRATORY PANEL BY RT PCR (FLU A&B, COVID)
Influenza A by PCR: NEGATIVE
Influenza B by PCR: NEGATIVE
SARS Coronavirus 2 by RT PCR: NEGATIVE

## 2020-06-28 LAB — ECHOCARDIOGRAM COMPLETE
Area-P 1/2: 4.68 cm2
Height: 68 in
Radius: 0.2 cm
S' Lateral: 2.4 cm
Weight: 1841.28 oz

## 2020-06-28 LAB — TSH: TSH: 1.713 u[IU]/mL (ref 0.350–4.500)

## 2020-06-28 LAB — OSMOLALITY, URINE: Osmolality, Ur: 446 mOsm/kg (ref 300–900)

## 2020-06-28 LAB — OSMOLALITY: Osmolality: 270 mOsm/kg — ABNORMAL LOW (ref 275–295)

## 2020-06-28 LAB — MAGNESIUM: Magnesium: 2.6 mg/dL — ABNORMAL HIGH (ref 1.7–2.4)

## 2020-06-28 MED ORDER — FUROSEMIDE 10 MG/ML IJ SOLN
40.0000 mg | Freq: Every day | INTRAMUSCULAR | Status: DC
  Administered 2020-06-28: 40 mg via INTRAVENOUS
  Filled 2020-06-28: qty 4

## 2020-06-28 MED ORDER — APIXABAN 2.5 MG PO TABS
2.5000 mg | ORAL_TABLET | Freq: Two times a day (BID) | ORAL | Status: DC
  Administered 2020-06-28 – 2020-07-11 (×27): 2.5 mg via ORAL
  Filled 2020-06-28 (×28): qty 1

## 2020-06-28 MED ORDER — ACETAMINOPHEN 325 MG PO TABS
650.0000 mg | ORAL_TABLET | Freq: Four times a day (QID) | ORAL | Status: DC | PRN
  Administered 2020-07-07: 650 mg via ORAL
  Filled 2020-06-28: qty 2

## 2020-06-28 MED ORDER — METOPROLOL SUCCINATE ER 100 MG PO TB24
100.0000 mg | ORAL_TABLET | Freq: Every day | ORAL | Status: DC
  Administered 2020-06-28 – 2020-07-01 (×4): 100 mg via ORAL
  Filled 2020-06-28 (×4): qty 1

## 2020-06-28 MED ORDER — PANTOPRAZOLE SODIUM 40 MG PO TBEC
40.0000 mg | DELAYED_RELEASE_TABLET | Freq: Every day | ORAL | Status: DC
  Administered 2020-06-28 – 2020-07-11 (×14): 40 mg via ORAL
  Filled 2020-06-28 (×14): qty 1

## 2020-06-28 MED ORDER — ONDANSETRON HCL 4 MG/2ML IJ SOLN: 4.0000 mg | Freq: Four times a day (QID) | INTRAMUSCULAR | Status: DC | PRN

## 2020-06-28 MED ORDER — IRBESARTAN 150 MG PO TABS
150.0000 mg | ORAL_TABLET | Freq: Every day | ORAL | Status: DC
  Administered 2020-06-28 – 2020-07-09 (×12): 150 mg via ORAL
  Filled 2020-06-28 (×13): qty 1

## 2020-06-28 MED ORDER — ALPRAZOLAM 0.5 MG PO TABS
0.5000 mg | ORAL_TABLET | Freq: Three times a day (TID) | ORAL | Status: DC | PRN
  Administered 2020-06-28 – 2020-07-08 (×20): 0.5 mg via ORAL
  Filled 2020-06-28 (×10): qty 1
  Filled 2020-06-28: qty 2
  Filled 2020-06-28 (×9): qty 1

## 2020-06-28 MED ORDER — ACETAMINOPHEN 650 MG RE SUPP: 650.0000 mg | Freq: Four times a day (QID) | RECTAL | Status: DC | PRN

## 2020-06-28 MED ORDER — LEVOTHYROXINE SODIUM 100 MCG PO TABS
100.0000 ug | ORAL_TABLET | Freq: Every day | ORAL | Status: DC
  Administered 2020-06-28 – 2020-07-11 (×14): 100 ug via ORAL
  Filled 2020-06-28 (×13): qty 1

## 2020-06-28 MED ORDER — SODIUM CHLORIDE 0.9% FLUSH
3.0000 mL | Freq: Two times a day (BID) | INTRAVENOUS | Status: DC
  Administered 2020-06-28 – 2020-07-12 (×29): 3 mL via INTRAVENOUS

## 2020-06-28 MED ORDER — POLYETHYLENE GLYCOL 3350 17 G PO PACK
17.0000 g | PACK | Freq: Every day | ORAL | Status: DC | PRN
  Administered 2020-07-04 – 2020-07-07 (×3): 17 g via ORAL
  Filled 2020-06-28 (×2): qty 1

## 2020-06-28 MED ORDER — ONDANSETRON HCL 4 MG PO TABS: 4.0000 mg | ORAL_TABLET | Freq: Four times a day (QID) | ORAL | Status: DC | PRN

## 2020-06-28 MED ORDER — AMLODIPINE BESYLATE 10 MG PO TABS
10.0000 mg | ORAL_TABLET | Freq: Every evening | ORAL | Status: DC
  Administered 2020-06-28 – 2020-07-07 (×10): 10 mg via ORAL
  Filled 2020-06-28 (×10): qty 1

## 2020-06-28 MED ORDER — POTASSIUM CHLORIDE 20 MEQ PO PACK
20.0000 meq | PACK | Freq: Every day | ORAL | Status: DC
  Administered 2020-06-28 – 2020-07-04 (×7): 20 meq via ORAL
  Filled 2020-06-28 (×7): qty 1

## 2020-06-28 MED ORDER — MELATONIN 3 MG PO TABS
3.0000 mg | ORAL_TABLET | Freq: Every day | ORAL | Status: DC
  Administered 2020-06-28 – 2020-07-10 (×13): 3 mg via ORAL
  Filled 2020-06-28 (×14): qty 1

## 2020-06-28 MED ORDER — TEMAZEPAM 15 MG PO CAPS
30.0000 mg | ORAL_CAPSULE | Freq: Every day | ORAL | Status: DC
  Administered 2020-06-28 – 2020-07-08 (×11): 30 mg via ORAL
  Filled 2020-06-28 (×11): qty 2

## 2020-06-28 MED ORDER — FUROSEMIDE 10 MG/ML IJ SOLN
20.0000 mg | Freq: Once | INTRAMUSCULAR | Status: AC
  Administered 2020-06-28: 20 mg via INTRAVENOUS

## 2020-06-28 NOTE — Progress Notes (Signed)
  Echocardiogram 2D Echocardiogram has been performed.  Augustine Radar 06/28/2020, 1:54 PM

## 2020-06-28 NOTE — Progress Notes (Signed)
OT Cancellation Note  Patient Details Name: [name] MRN: [id number] DOB: 1930/11/06   Cancelled Treatment:    Reason Eval/Treat Not Completed: Patient at procedure or test/ unavailable.  Pt undergoing ECHO  Eber Jones., OTR/L Acute Rehabilitation Services Pager [phone] Office [phone]   Jeani Hawking M 06/28/2020, 1:52 PM

## 2020-06-28 NOTE — Evaluation (Signed)
Occupational Therapy Evaluation Patient Details Name: [name] MRN: [id number] DOB: August 01, 1931 Today's Date: 06/28/2020    History of Present Illness This 21 admitted with SOB and cough.  Dx:  acute respiratory failrue with hypoxia, acute on chronic diastolic CHF, pleural effusions, hypnatremia, paroxysmal A-Fib.  PMH includes: unspecified tinnitus, hereditary ideopathic peripheral neuropathy, HTN, senile osteoporosis, OA, macular degeneration, diaphragmatic hernia, anxiety    Clinical Impression   Pt admitted with above. She demonstrates the below listed deficits and will benefit from continued OT to maximize safety and independence with BADLs.  Pt presents to OT with generalized weakness, mild balance deficits, decreased activity tolerance, and low vision due to macular degeneration.  She currently requires min guard to min A for ADLs and functional mobility.  She lives at [facility] with her spouse and was mod I with ADLs PTA.  She requires assist for IADLs.  Sats remained >91% on 5L supplemental 02.  She is eager to progress and discharge home.  Will follow acutely.       Follow Up Recommendations  Home health OT;Supervision/Assistance - 24 hour    Equipment Recommendations  None recommended by OT    Recommendations for Other Services       Precautions / Restrictions Precautions Precautions: None Restrictions Weight Bearing Restrictions: No      Mobility Bed Mobility Overal bed mobility: Needs Assistance Bed Mobility: Supine to Sit     Supine to sit: Min assist     General bed mobility comments: assist to lift trunk     Transfers Overall transfer level: Needs assistance Equipment used: Rolling walker (2 wheeled) Transfers: Sit to/from [employer] Transfers Sit to Stand: Min guard Stand pivot transfers: Min assist       General transfer comment: increased posterior lean requiring min A for balance, verbal cueing needed for hand placement     Balance Overall balance assessment: Needs assistance Sitting-balance support: Feet supported;No upper extremity supported Sitting balance-[name]: Good     Standing balance support: Single extremity supported Standing balance-[name]: Poor Standing balance comment: reliant on UE support                            ADL either performed or assessed with clinical judgement   ADL Overall ADL's : Needs assistance/impaired Eating/Feeding: Modified independent;Sitting   Grooming: Wash/dry face;Wash/dry hands;Oral care;Brushing hair;Minimal assistance;Standing   Upper Body Bathing: Set up;Sitting   Lower Body Bathing: Min guard;Sit to/from stand   Upper Body Dressing : Set up;Sitting   Lower Body Dressing: Min guard;Sit to/from stand   Toilet Transfer: Min guard;Ambulation;Comfort height toilet;BSC;RW;Grab bars   Toileting- Clothing Manipulation and Hygiene: Min guard;Sit to/from stand       Functional mobility during ADLs: Min guard;Rolling walker       Vision Baseline Vision/History: Wears glasses;Macular Degeneration Wears Glasses: At all times Patient Visual Report: No change from baseline Additional Comments: Pt has long standing low vision - relies on tactile input to locate items due to macular degeneration.  She receives intraoccular Avastin injections from her ophthalmologist      Perception     Praxis      Pertinent Vitals/Pain Pain Assessment: No/denies pain     Hand Dominance Right   Extremity/Trunk Assessment Upper Extremity Assessment Upper Extremity Assessment: Generalized weakness   Lower Extremity Assessment Lower Extremity Assessment: Defer to PT evaluation   Cervical / Trunk Assessment Cervical / Trunk Assessment: Kyphotic  Communication Communication Communication: No difficulties   Cognition Arousal/Alertness: Awake/alert Behavior During Therapy: WFL for tasks assessed/performed Overall Cognitive Status: Within  Functional Limits for tasks assessed                                 General Comments:  (grossly assessed )   General Comments  02 sats >91% on 5L supplemental 02     Exercises     Shoulder Instructions      Home Living Family/patient expects to be discharged to:: Private residence Living Arrangements: Spouse/significant other;Children Available Help at Discharge: Family;Available 24 hours/day Type of Home: Independent living facility Home Access: Elevator     Home Layout: One level     Bathroom Shower/Tub: Producer, television/film/video: Handicapped height Bathroom Accessibility: Yes   Home Equipment: Environmental consultant - 2 wheels;Shower seat   Additional Comments: pt does not shower       Prior Functioning/Environment Level of Independence: Independent with assistive device(s)        Comments: Pt reports she uses a rollator.  She ambulates to dining room for meals, but has had to sit and rest lately.  She does not drive, and does not grocery shop.  She reports she is mod I with ADLs         OT Problem List: Decreased strength;Decreased activity tolerance;Impaired balance (sitting and/or standing);Decreased knowledge of use of DME or AE;Impaired vision/perception;Cardiopulmonary status limiting activity      OT Treatment/Interventions: Self-care/ADL training;Therapeutic exercise;DME and/or AE instruction;Therapeutic activities;Visual/perceptual remediation/compensation;Patient/family education;Balance training    OT Goals(Current goals can be found in the care plan section) Acute Rehab OT Goals Patient Stated Goal: to go home  OT Goal Formulation: With patient Time For Goal Achievement: 07/05/20 Potential to Achieve Goals: Good ADL Goals Pt Will Perform Grooming: with supervision;standing Pt Will Perform Upper Body Bathing: with supervision;sitting Pt Will Perform Lower Body Bathing: with supervision;sit to/from stand Pt Will Perform Upper Body Dressing:  with supervision;sitting Pt Will Perform Lower Body Dressing: with supervision;sit to/from stand Pt Will Transfer to Toilet: with supervision;ambulating;regular height toilet Pt Will Perform Toileting - Clothing Manipulation and hygiene: with supervision  OT Frequency: Min 2X/week   Barriers to D/C:            Co-evaluation              AM-PAC OT "6 Clicks" Daily Activity     Outcome Measure Help from another person eating meals?: None Help from another person taking care of personal grooming?: A Little Help from another person toileting, which includes using toliet, bedpan, or urinal?: A Little Help from another person bathing (including washing, rinsing, drying)?: A Little Help from another person to put on and taking off regular upper body clothing?: A Little Help from another person to put on and taking off regular lower body clothing?: A Little 6 Click Score: 19   End of Session Equipment Utilized During Treatment: Gait belt;Oxygen Nurse Communication: Mobility status  Activity Tolerance: Patient tolerated treatment well Patient left: in bed;with call bell/phone within reach;with bed alarm set  OT Visit Diagnosis: Unsteadiness on feet (R26.81)                Time: 1287-8676 OT Time Calculation (min): 20 min Charges:  OT General Charges $OT Visit: 1 Visit OT Evaluation $OT Eval Moderate Complexity: 1 Mod  Sekai Nayak C., OTR/L Acute Rehabilitation Services Pager [phone] Office [phone]  Jeani Hawking M 06/28/2020, 4:16 PM

## 2020-06-28 NOTE — Progress Notes (Signed)
PROGRESS NOTE    [name]  KYH:[id number] DOB: 11/20/1930 DOA: 06/15/2020 PCP: Kermit Balo, DO   Brief Narrative: [name] is a 86 y.o. female with medical history significant for paroxysmal atrial fibrillation on Eliquis, hypertension, hypothyroidism, GERD, and anxiety. Patient presented secondary to dyspnea with concern for heart failure exacerbation. Lasix started.   Assessment & Plan:   Principal Problem:   Acute respiratory failure with hypoxia and hypercapnia (HCC) Active Problems:   Hypothyroidism, adult   Essential hypertension   Anxiety state   Acute on chronic diastolic CHF (congestive heart failure) (HCC)   Pleural effusion, right   PAF (paroxysmal atrial fibrillation) (HCC)   Hyponatremia   Acute respiratory failure with hypoxia Appears secondary to heart failure/fluid overload with evidence of bilateral pleural effusions on chest x-ray. VBG significant for elevated pCO2 but unsure this constitutes true hypercapnia. Currently on 5L of oxygen via [place] -Wean to room as able  Acute on chronic diastolic heart failure Last Transthoracic Echocardiogram from 12/2019 significant for grade 1 diastolic dysfunction -Start Lasix 40 mg IV daily -BMP -Daily weights and strict in/out  Paroxysmal atrial fibrillation Currently in atrial fibrillation. Rate controlled. On Eliquis as an outpatient. -Continue Toprol-XL, Eliquis  Hyponatremia Unsure of etiology. Patient with likely heart failure with hypervolemia in addition to hydrochlorothiazide and sertraline use as an outpatient. -BMP q12 hours  Primary hypertension -Continue irbesartan and amlodipine -Hold hydrochlorothiazide secondary to hyponatremia  Hypothyroidism TSH of 1.71 -Continue Synthroid  GERD -Continue Protonix  DVT prophylaxis: Eliquis Code Status:   Code Status: DNR Family Communication: None at bedside Disposition Plan: Discharge back to ILF in 1-3 days pending diuresis and wean to room  air with HHPT   Consultants:   None  Procedures:   None  Antimicrobials:  None    Subjective: Breathing better than yesterday.  Objective: Vitals:   06/28/20 0749 06/28/20 1000 06/28/20 1200 06/28/20 1225  BP: 111/69 137/81 (!) 150/85   Pulse: 73 83 (!) 118   Resp: 14 17    Temp: (!) 96.7 F (35.9 C)     TempSrc: Oral     SpO2: 96% 97% 94% 93%  Weight:      Height:        Intake/Output Summary (Last 24 hours) at 06/28/2020 1305 Last data filed at 06/28/2020 1039 Gross per 24 hour  Intake 50 ml  Output 550 ml  Net -500 ml   Filed Weights   07/03/2020 2149  Weight: 52.2 kg    Examination:  General exam: Appears calm and comfortable Respiratory system: Diminished. Respiratory effort normal. Cardiovascular system: S1 & S2 heard, RRR. No murmurs, rubs, gallops or clicks. Gastrointestinal system: Abdomen is nondistended, soft and nontender. No organomegaly or masses felt. Normal bowel sounds heard. Central nervous system: Alert and oriented. No focal neurological deficits. Musculoskeletal: 2+ BLE edema. No calf tenderness Skin: No cyanosis. No rashes Psychiatry: Judgement and insight appear normal. Mood & affect appropriate.     Data Reviewed: I have personally reviewed following labs and imaging studies  CBC Lab Results  Component Value Date   WBC 12.2 (H) 06/28/2020   RBC 4.66 06/28/2020   HGB 13.7 06/28/2020   HCT 41.5 06/28/2020   MCV 89.1 06/28/2020   MCH 29.4 06/28/2020   PLT 152 06/28/2020   MCHC 33.0 06/28/2020   RDW 12.8 06/28/2020   LYMPHSABS 0.7 07/01/2020   MONOABS 0.6 07/12/2020   EOSABS 0.0 06/21/2020   BASOSABS 0.0 07/06/2020  Last metabolic panel Lab Results  Component Value Date   NA 125 (L) 06/28/2020   K 3.9 06/28/2020   CL 81 (L) 06/28/2020   CO2 33 (H) 06/28/2020   BUN 14 06/28/2020   CREATININE 0.60 06/28/2020   GLUCOSE 154 (H) 06/28/2020   GFRNONAA >60 06/28/2020   GFRAA 66 01/14/2020   CALCIUM 9.2 06/28/2020    PHOS 2.1 (L) 08/02/2016   PROT 7.3 06/19/2020   ALBUMIN 4.1 07/09/2020   LABGLOB 2.3 07/30/2015   AGRATIO 1.7 07/30/2015   BILITOT 0.6 07/06/2020   ALKPHOS 115 06/23/2020   AST 24 07/01/2020   ALT 24 06/18/2020   ANIONGAP 11 06/28/2020    CBG (last 3)  No results for input(s): GLUCAP in the last 72 hours.   GFR: Estimated Creatinine Clearance: 40.8 mL/min (by C-G formula based on SCr of 0.6 mg/dL).  Coagulation Profile: No results for input(s): INR, PROTIME in the last 168 hours.  Recent Results (from the past 240 hour(s))  Respiratory Panel by RT PCR (Flu A&B, Covid) - Nasopharyngeal Swab     Status: None   Collection Time: 07/10/2020 11:36 PM   Specimen: Nasopharyngeal Swab  Result Value Ref Range Status   SARS Coronavirus 2 by RT PCR NEGATIVE NEGATIVE Final    Comment: (NOTE) SARS-CoV-2 target nucleic acids are NOT DETECTED.  The SARS-CoV-2 RNA is generally detectable in upper respiratoy specimens during the acute phase of infection. The lowest concentration of SARS-CoV-2 viral copies this assay can detect is 131 copies/mL. A negative result does not preclude SARS-Cov-2 infection and should not be used as the sole basis for treatment or other patient management decisions. A negative result may occur with  improper specimen collection/handling, submission of specimen other than nasopharyngeal swab, presence of viral mutation(s) within the areas targeted by this assay, and inadequate number of viral copies (<131 copies/mL). A negative result must be combined with clinical observations, patient history, and epidemiological information. The expected result is Negative.  Fact Sheet for Patients:  https://www.moore.com/  Fact Sheet for Healthcare Providers:  https://www.young.biz/  This test is no t yet approved or cleared by the [place] FDA and  has been authorized for detection and/or diagnosis of SARS-CoV-2 by FDA under  an Emergency Use Authorization (EUA). This EUA will remain  in effect (meaning this test can be used) for the duration of the COVID-19 declaration under Section 564(b)(1) of the Act, 21 U.S.C. section 360bbb-3(b)(1), unless the authorization is terminated or revoked sooner.     Influenza A by PCR NEGATIVE NEGATIVE Final   Influenza B by PCR NEGATIVE NEGATIVE Final    Comment: (NOTE) The Xpert Xpress SARS-CoV-2/FLU/RSV assay is intended as an aid in  the diagnosis of influenza from Nasopharyngeal swab specimens and  should not be used as a sole basis for treatment. Nasal washings and  aspirates are unacceptable for Xpert Xpress SARS-CoV-2/FLU/RSV  testing.  Fact Sheet for Patients: https://www.moore.com/  Fact Sheet for Healthcare Providers: https://www.young.biz/  This test is not yet approved or cleared by the [place] FDA and  has been authorized for detection and/or diagnosis of SARS-CoV-2 by  FDA under an Emergency Use Authorization (EUA). This EUA will remain  in effect (meaning this test can be used) for the duration of the  Covid-19 declaration under Section 564(b)(1) of the Act, 21  U.S.C. section 360bbb-3(b)(1), unless the authorization is  terminated or revoked. Performed at [facility], [address] [place], [place] [address]  Radiology Studies: DG Chest 2 View  Result Date: 07/07/2020 CLINICAL DATA:  Dyspnea EXAM: CHEST - 2 VIEW COMPARISON:  11/17/2019 FINDINGS: Moderate right pleural effusion has developed with compressive atelectasis of the right lung base. Small left pleural effusion has developed. The lungs are hyperinflated in keeping with underlying COPD. No pneumothorax. Cardiac size is within normal limits when accounting for moderate rotation on this examination. Pulmonary vascularity is normal. No acute bone abnormality. IMPRESSION: Interval development of bilateral pleural effusions, right  greater than left, with associated compressive atelectasis. Electronically Signed   By: Helyn Numbers MD   On: 06/26/2020 22:42        Scheduled Meds: . amLODipine  10 mg Oral QPM  . apixaban  2.5 mg Oral BID  . furosemide  40 mg Intravenous Daily  . irbesartan  150 mg Oral Daily  . levothyroxine  100 mcg Oral Q0600  . melatonin  3 mg Oral QHS  . metoprolol succinate  100 mg Oral Daily  . pantoprazole  40 mg Oral Daily  . potassium chloride  20 mEq Oral Daily  . sodium chloride flush  3 mL Intravenous Q12H   Continuous Infusions:   LOS: 0 days     Jacquelin Hawking, MD [facility] 06/28/2020, 1:05 PM  If 7PM-7AM, please contact night-coverage [email or web address]

## 2020-06-28 NOTE — Evaluation (Signed)
Clinical/Bedside Swallow Evaluation Patient Details  Name: MELVIE PAGLIA MRN: [id number] Date of Birth: [birth year]  Today's Date: 06/28/2020 Time: SLP Start Time (ACUTE ONLY): 1414 SLP Stop Time (ACUTE ONLY): 1430 SLP Time Calculation (min) (ACUTE ONLY): 16 min  Past Medical History:  Past Medical History:  Diagnosis Date  . Allergic rhinitis due to pollen   . Anxiety state, unspecified   . Calculus of kidney   . Cataract   . Diaphragmatic hernia without mention of obstruction or gangrene   . Diffuse cystic mastopathy   . Dizziness and giddiness   . GERD (gastroesophageal reflux disease)   . Hiatal hernia   . Insomnia, unspecified   . Insomnia, unspecified   . Lumbago   . Macular degeneration (senile) of retina, unspecified   . Open wound of toe(s), without mention of complication   . Osteoarthrosis, unspecified whether generalized or localized, unspecified site   . Other and unspecified hyperlipidemia   . PAF (paroxysmal atrial fibrillation) (HCC)   . Personal history of fall   . Sciatica   . Sebaceous cyst   . Senile osteoporosis   . Unspecified constipation   . Unspecified essential hypertension   . Unspecified hereditary and idiopathic peripheral neuropathy   . Unspecified hypothyroidism   . Unspecified tinnitus    Past Surgical History:  Past Surgical History:  Procedure Laterality Date  . ABDOMINAL HYSTERECTOMY    . CATARACT EXTRACTION, BILATERAL    . CHOLECYSTECTOMY  07/25/2007  . TONSILLECTOMY AND ADENOIDECTOMY Bilateral 1943   HPI:  Pt is an 86 y.o. female with medical history significant for paroxysmal atrial fibrillation on Eliquis, hypertension, hypothyroidism, GERD, and anxiety who presented to the ED for evaluation of shortness of breath and cough.  CXR revealed interval development of bilateral pleural effusions, right greater than left, with associated compressive atelectasis. On date of admission, pt reported that she was taking her afternoon  medications (Eliquis, Tylenol, and vitamin supplements) when she choked on the pills, began to have nonproductive coughing fits, and increased SOB. Pt reported to referring MD, that she has "frequent issues with choking on her pills, most notably on her potassium supplement which she takes every morning."   Assessment / Plan / Recommendation Clinical Impression  Pt was seen for bedside swallow evaluation. She denied any signs of aspiration with meals but reported "choking" with large pills. Per the pt she typically takes her pills individually with yogurt. However, she stated that on date of admission she attempted to take two pills (a multivitamin and Elequis) at once, did not drink enough water, and "got choked". Oral mechanism exam was [facility] and dentition was adequate. She was seen during lunch and tolerated all solids and liquids without signs or symptoms of oropharyngeal dysphagia. A regular texture diet with thin liquids is recommended at this time. Medication should be given individually with puree. Further skilled SLP services are not clinically indicated for swallowing.  SLP Visit Diagnosis: Dysphagia, unspecified (R13.10)    Aspiration Risk  No limitations    Diet Recommendation Regular;Thin liquid   Liquid Administration via: Cup;Straw Medication Administration: Whole meds with puree (break potassium pill) Supervision: Patient able to self feed Compensations: Slow rate Postural Changes: Seated upright at 90 degrees    Other  Recommendations Oral Care Recommendations: Oral care BID   Follow up Recommendations None      Frequency and Duration            Prognosis        Swallow  Study   General Date of Onset: 07/14/2020 HPI: Pt is an 86 y.o. female with medical history significant for paroxysmal atrial fibrillation on Eliquis, hypertension, hypothyroidism, GERD, and anxiety who presented to the ED for evaluation of shortness of breath and cough.  CXR revealed interval  development of bilateral pleural effusions, right greater than left, with associated compressive atelectasis. On date of admission, pt reported that she was taking her afternoon medications (Eliquis, Tylenol, and vitamin supplements) when she choked on the pills, began to have nonproductive coughing fits, and increased SOB. Pt reported to referring MD, that she has "frequent issues with choking on her pills, most notably on her potassium supplement which she takes every morning." Type of Study: Bedside Swallow Evaluation Previous Swallow Assessment: None  Diet Prior to this Study: Regular;Thin liquids Temperature Spikes Noted: No Respiratory Status: Nasal cannula History of Recent Intubation: No Behavior/Cognition: Alert;Cooperative;Pleasant mood Oral Cavity Assessment: Within Functional Limits Oral Care Completed by SLP: No Oral Cavity - Dentition: Adequate natural dentition Vision: Impaired for self-feeding (needs set up d/t macular degeneration) Self-Feeding Abilities: Able to feed self Patient Positioning: Upright in bed;Postural control adequate for testing Baseline Vocal Quality: Normal Volitional Cough: Strong Volitional Swallow: Able to elicit    Oral/Motor/Sensory Function Overall Oral Motor/Sensory Function: Within functional limits   Ice Chips Ice chips: Within functional limits Presentation: Spoon   Thin Liquid Thin Liquid: Within functional limits Presentation: Straw    Nectar Thick Nectar Thick Liquid: Not tested   Honey Thick Honey Thick Liquid: Not tested   Puree Puree: Within functional limits Presentation: Spoon;Self Fed   Solid     Solid: Within functional limits Presentation: Self Fed     Ciclaly Mulcahey I. Vear Clock, MS, CCC-SLP Acute Rehabilitation Services Office number [phone] Pager [phone]  Scheryl Marten 06/28/2020,2:36 PM

## 2020-06-28 NOTE — Evaluation (Signed)
Physical Therapy Evaluation Patient Details Name: [name] MRN: [id number] DOB: [birth year] Today's Date: 06/28/2020   History of Present Illness  pt is a 86 y.o. female with medical history significant for paroxysmal atrial fibrillation on Eliquis, hypertension, hypothyroidism, GERD, and anxiety who presents to the ED for evaluation of shortness of breath and cough  Clinical Impression  Pt is admitted with SOB; pt was I with use of AD and occasional assistance needed due to deteriorating vision; pt requiring min A for bed mobiltiy and balance with transfers with use of RW; pt requiring use of O2 during session to maintain appropriate SPO2; pt demonstrating deficits in balance, strength, coordination, gait and endurance and will benefit from skilled PT to address deficits to maximize independence with functional mobility, recommend follow up HHPT and 24 hour S/A    Follow Up Recommendations Home health PT;Supervision/Assistance - 24 hour    Equipment Recommendations  None recommended by PT    Recommendations for Other Services       Precautions / Restrictions Precautions Precautions: None Restrictions Weight Bearing Restrictions: No      Mobility  Bed Mobility Overal bed mobility: Needs Assistance Bed Mobility: Supine to Sit     Supine to sit: Min assist;HOB elevated          Transfers Overall transfer level: Needs assistance Equipment used: Rolling walker (2 wheeled) Transfers: Sit to/from Stand Sit to Stand: Min assist         General transfer comment: increased posterior lean requiring min A for balance, verbal cueing needed for hand placement  Ambulation/Gait Ambulation/Gait assistance: Min guard Gait Distance (Feet): 12 Feet Assistive device: Rolling walker (2 wheeled) Gait Pattern/deviations: Step-through pattern;Decreased step length - right;Decreased step length - left;Trunk flexed Gait velocity: decreased      Stairs            Wheelchair  Mobility    Modified Rankin (Stroke Patients Only)       Balance Overall balance assessment: Needs assistance Sitting-balance support: Feet supported;No upper extremity supported Sitting balance-[name]: Fair     Standing balance support: Bilateral upper extremity supported Standing balance-[name]: Poor                               Pertinent Vitals/Pain      Home Living Family/patient expects to be discharged to:: Private residence Living Arrangements: Spouse/significant other;Children Available Help at Discharge: Family;Available 24 hours/day Type of Home: Apartment Home Access: Elevator     Home Layout: One level Home Equipment: Walker - 2 wheels;Walker - 4 wheels;Grab bars - toilet Additional Comments: pt does not shower in shower, she sits a sink and does a sponge bath    Prior Function Level of Independence: Independent with assistive device(s)         Comments: pt states she is I with use of rollator or RW; pt states her vision has gotten worse so does requiring her husband to help her due to that     Hand Dominance        Extremity/Trunk Assessment   Upper Extremity Assessment Upper Extremity Assessment: Defer to OT evaluation    Lower Extremity Assessment Lower Extremity Assessment: Generalized weakness    Cervical / Trunk Assessment Cervical / Trunk Assessment: Kyphotic  Communication   Communication: No difficulties  Cognition Arousal/Alertness: Awake/alert Behavior During Therapy: WFL for tasks assessed/performed Overall Cognitive Status: Within Functional Limits for tasks assessed  General Comments General comments (skin integrity, edema, etc.): pt taken off O2 while sitting EOB, O2 dropped to 84%; RN and MD made aware    Exercises     Assessment/Plan    PT Assessment Patient needs continued PT services  PT Problem List Decreased strength;Decreased  mobility;Decreased safety awareness;Decreased coordination;Decreased activity tolerance;Decreased balance;Decreased knowledge of use of DME       PT Treatment Interventions DME instruction;Therapeutic exercise;Gait training;Balance training;Functional mobility training;Therapeutic activities;Patient/family education    PT Goals (Current goals can be found in the Care Plan section)  Acute Rehab PT Goals Patient Stated Goal: I want to go home PT Goal Formulation: With patient Time For Goal Achievement: 07/12/20 Potential to Achieve Goals: Good    Frequency Min 3X/week   Barriers to discharge        Co-evaluation               AM-PAC PT "6 Clicks" Mobility  Outcome Measure Help needed turning from your back to your side while in a flat bed without using bedrails?: None Help needed moving from lying on your back to sitting on the side of a flat bed without using bedrails?: A Little Help needed moving to and from a bed to a chair (including a wheelchair)?: A Little Help needed standing up from a chair using your arms (e.g., wheelchair or bedside chair)?: A Little Help needed to walk in hospital room?: A Little Help needed climbing 3-5 steps with a railing? : A Lot 6 Click Score: 18    End of Session Equipment Utilized During Treatment: Gait belt;Oxygen Activity Tolerance: Patient tolerated treatment well Patient left: in chair;with call bell/phone within reach Nurse Communication: Mobility status PT Visit Diagnosis: Unsteadiness on feet (R26.81);Muscle weakness (generalized) (M62.81);Other abnormalities of gait and mobility (R26.89)    Time: 0811-0829 PT Time Calculation (min) (ACUTE ONLY): 18 min   Charges:   PT Evaluation $PT Eval Low Complexity: 1 Low          Ginette Otto, DPT Acute Rehabilitation Services [phone]  Lucretia Field 06/28/2020, 12:29 PM

## 2020-06-29 ENCOUNTER — Inpatient Hospital Stay (HOSPITAL_COMMUNITY): Payer: Medicare Other

## 2020-06-29 DIAGNOSIS — J9601 Acute respiratory failure with hypoxia: Secondary | ICD-10-CM | POA: Diagnosis not present

## 2020-06-29 DIAGNOSIS — I5033 Acute on chronic diastolic (congestive) heart failure: Secondary | ICD-10-CM | POA: Diagnosis not present

## 2020-06-29 DIAGNOSIS — I10 Essential (primary) hypertension: Secondary | ICD-10-CM | POA: Diagnosis not present

## 2020-06-29 DIAGNOSIS — F411 Generalized anxiety disorder: Secondary | ICD-10-CM | POA: Diagnosis not present

## 2020-06-29 LAB — BASIC METABOLIC PANEL
Anion gap: 7 (ref 5–15)
Anion gap: 10 (ref 5–15)
BUN: 15 mg/dL (ref 8–23)
BUN: 16 mg/dL (ref 8–23)
CO2: 36 mmol/L — ABNORMAL HIGH (ref 22–32)
CO2: 36 mmol/L — ABNORMAL HIGH (ref 22–32)
Calcium: 8.8 mg/dL — ABNORMAL LOW (ref 8.9–10.3)
Calcium: 9.4 mg/dL (ref 8.9–10.3)
Chloride: 78 mmol/L — ABNORMAL LOW (ref 98–111)
Chloride: 77 mmol/L — ABNORMAL LOW (ref 98–111)
Creatinine, Ser: 0.5 mg/dL (ref 0.44–1.00)
Creatinine, Ser: 0.53 mg/dL (ref 0.44–1.00)
GFR, Estimated: >60 mL/min (ref >60)
GFR, Estimated: >60 mL/min (ref >60)
Glucose, Bld: 106 mg/dL — ABNORMAL HIGH (ref 70–99)
Glucose, Bld: 157 mg/dL — ABNORMAL HIGH (ref 70–99)
Potassium: 3.8 mmol/L (ref 3.5–5.1)
Potassium: 4.2 mmol/L (ref 3.5–5.1)
Sodium: 121 mmol/L — ABNORMAL LOW (ref 135–145)
Sodium: 123 mmol/L — ABNORMAL LOW (ref 135–145)

## 2020-06-29 LAB — RESPIRATORY PANEL BY PCR

## 2020-06-29 LAB — SODIUM, URINE, RANDOM: Sodium, Ur: <10 mmol/L

## 2020-06-29 LAB — OSMOLALITY, URINE: Osmolality, Ur: 435 mOsm/kg (ref 300–900)

## 2020-06-29 LAB — GLUCOSE, CAPILLARY
Glucose-Capillary: 114 mg/dL — ABNORMAL HIGH (ref 70–99)
Glucose-Capillary: 138 mg/dL — ABNORMAL HIGH (ref 70–99)

## 2020-06-29 MED ORDER — HYDROCORTISONE (PERIANAL) 2.5 % EX CREA
1.0000 "application " | TOPICAL_CREAM | Freq: Four times a day (QID) | CUTANEOUS | Status: DC | PRN
  Filled 2020-06-29: qty 28.35

## 2020-06-29 NOTE — Plan of Care (Signed)

## 2020-06-29 NOTE — TOC Initial Note (Signed)
Transition of [name] [facility]) - Initial/Assessment Note    Patient Details  Name: [name] MRN: [id number] Date of Birth: 1931/01/18  [name]) CM/SW Contact:    Chelsea Guthrie, RN Phone Number: 06/29/2020, 11:06 AM  Clinical Narrative:     86 yr old female admitted with Acute Respiratory Failure, CHF exacerbation. Case manager spoke with patient's daughter, [name], concerning discharge needs. Patient and her husband live in apartment at [facility]. Discussed options for Home Health agencies. Referral called to [name], [facility] Liaison. TOC Team will continue to monitor.   Expected Discharge Plan: Home w Home Health Services Barriers to Discharge: Continued Medical Work up   Patient Goals and CMS Choice Patient states their goals for this hospitalization and ongoing recovery are:: go home per daughter CMS Medicare.gov Compare Post Acute [name] list provided to:: Patient Represenative (must comment) [name] [place] - daughter) Choice offered to / list presented to : Adult Children  Expected Discharge Plan and Services Expected Discharge Plan: Home w Home Health Services   Discharge Planning Services: CM Consult Post Acute [name] Choice: Home Health Living arrangements for the past 2 months: Apartment                 DME Arranged: N/A DME Agency: NA       HH Arranged: PT, OT HH Agency: [name] Date [facility] Agency Contacted: 06/29/20 Time HH Agency Contacted: 1105 Representative spoke with at [facility] Agency: [name]  Prior Living Arrangements/Services Living arrangements for the past 2 months: Apartment Lives with:: Spouse Patient language and need for interpreter reviewed:: Yes Do you feel safe going back to the place where you live?: Yes      Need for Family Participation in Patient [name]: Yes (Comment) [name] giver support system in place?: Yes (comment)   Criminal Activity/Legal Involvement Pertinent to  Current Situation/Hospitalization: No - Comment as needed  Activities of Daily Living Home Assistive Devices/Equipment: Eyeglasses, Dan Humphreys (specify type) ADL Screening (condition at time of admission) Patient's cognitive ability adequate to safely complete daily activities?: Yes Is the patient deaf or have difficulty hearing?: No Does the patient have difficulty seeing, even when wearing glasses/contacts?: Yes Does the patient have difficulty concentrating, remembering, or making decisions?: No Patient able to express need for assistance with ADLs?: Yes Does the patient have difficulty dressing or bathing?: No Independently performs ADLs?: Yes (appropriate for developmental age) Does the patient have difficulty walking or climbing stairs?: Yes Weakness of Legs: Both Weakness of Arms/Hands: None  Permission Sought/Granted                  Emotional Assessment       Orientation: : Oriented to Self, Oriented to Place, Oriented to  Time, Oriented to Situation Alcohol / Substance Use: Not Applicable Psych Involvement: No (comment)  Admission diagnosis:  Acute respiratory failure with hypoxia and hypercapnia (HCC) [J96.01, J96.02] Aspiration pneumonia of both lower lobes, unspecified aspiration pneumonia type [facility]) [J69.0] Patient Active Problem List   Diagnosis Date Noted  . Acute respiratory failure with hypoxia and hypercapnia (HCC) 07/10/2020  . Acute on chronic diastolic CHF (congestive heart failure) (HCC) 07/06/2020  . Pleural effusion, right 06/15/2020  . Hyponatremia 07/06/2020  . PAF (paroxysmal atrial fibrillation) (HCC)   . Exudative age-related macular degeneration of left eye with active choroidal neovascularization (HCC) 12/11/2019  . Advanced nonexudative age-related macular degeneration of left eye with subfoveal involvement 12/11/2019  . Exudative age-related  macular degeneration of right eye with inactive choroidal neovascularization (HCC) 12/11/2019  .  Advanced nonexudative age-related macular degeneration of right eye without subfoveal involvement 12/11/2019  . Exudative age-related macular degeneration of right eye with active choroidal neovascularization (HCC) 12/11/2019  . Generalized OA 09/02/2019  . Osteoporosis 06/20/2017  . 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 Iron deficiency anemia secondary to inadequate dietary iron intake 02/10/2017  . Facial asymmetry 11/09/2016  . Incontinence 09/22/2016  . Hypoxia 08/02/2016  . Hypokalemia 08/02/2016  . Lumbar spondylolysis 04/13/2016  . Bilateral knee pain 12/08/2015  . Abnormality of gait 08/11/2015  . Edema 08/11/2015  . Bunion 08/11/2015  . Anxiety state 03/10/2015  . Palpitations 02/18/2015  . GERD (gastroesophageal reflux disease) 11/05/2014  . Hypothyroidism, adult   . Hyperlipidemia   . Essential hypertension   . Macular degeneration (senile) of retina   . Diaphragmatic hernia   . Insomnia   . Hereditary and idiopathic peripheral neuropathy   . Low back pain    PCP:  Chelsea Balo, DO Pharmacy:   [facility] [phone] - [place], [place] - [address] AT [address] OF [address] [address] [place] [address] Phone: [phone] Fax: [phone]     Social Determinants of Health (SDOH) Interventions    Readmission Risk Interventions No flowsheet data found.

## 2020-06-29 NOTE — Progress Notes (Addendum)
Physical Therapy Treatment Patient Details Name: [name] MRN: [id number] DOB: 05/06/1931 Today's Date: 06/29/2020    History of Present Illness This 66 admitted with SOB and cough.  Dx:  acute respiratory failrue with hypoxia, acute on chronic diastolic CHF, pleural effusions, hypnatremia, paroxysmal A-Fib.  PMH includes: unspecified tinnitus, hereditary ideopathic peripheral neuropathy, HTN, senile osteoporosis, OA, macular degeneration, diaphragmatic hernia, anxiety     PT Comments    Pt supine in bed on arrival.  Pt pleasant and motivated to progress.  Focused on increased activity tolerance.  Pt utilized rollator for energy conservation and she tolerated well.  Plan for HHPT remains appropriate.  SPO2 > 90% on 6LNC.      Follow Up Recommendations  Home health PT;Supervision/Assistance - 24 hour     Equipment Recommendations  None recommended by PT    Recommendations for Other Services       Precautions / Restrictions Precautions Precautions: None Restrictions Weight Bearing Restrictions: No    Mobility  Bed Mobility Overal bed mobility: Needs Assistance Bed Mobility: Supine to Sit     Supine to sit: Supervision     General bed mobility comments: assist for trunk  Transfers Overall transfer level: Needs assistance Equipment used: 4-wheeled walker Transfers: Sit to/from Stand Sit to Stand: Min guard Stand pivot transfers: Min assist       General transfer comment: Cues for hand placement to and from seated surface.  Pt pulling on rollator to rise into standing.  Pt required cues for locking rollator brakes for safety.  Ambulation/Gait Ambulation/Gait assistance: Min guard Gait Distance (Feet): 50 Feet Assistive device: 4-wheeled walker Gait Pattern/deviations: Step-through pattern;Decreased step length - right;Decreased step length - left;Trunk flexed Gait velocity: Increased cadence noted this session.   General Gait Details: Cues for pacing and  safety with use of rollator.   Stairs             Wheelchair Mobility    Modified Rankin (Stroke Patients Only)       Balance Overall balance assessment: Needs assistance Sitting-balance support: Feet supported;No upper extremity supported Sitting balance-[name]: Good     Standing balance support: Single extremity supported Standing balance-[name]: Poor Standing balance comment: reliant on RW                            Cognition Arousal/Alertness: Awake/alert Behavior During Therapy: WFL for tasks assessed/performed Overall Cognitive Status: Within Functional Limits for tasks assessed                                 General Comments: daughter reports pt is at her functional baseline      Exercises      General Comments General comments (skin integrity, edema, etc.): Pt on 6L O2 when up in bathroom with nursing.      Pertinent Vitals/Pain Pain Assessment: No/denies pain    Home Living                      Prior Function            PT Goals (current goals can now be found in the care plan section) Acute Rehab PT Goals Patient Stated Goal: to go home  Potential to Achieve Goals: Good Progress towards PT goals: Progressing toward goals    Frequency    Min 3X/week  PT Plan Current plan remains appropriate    Co-evaluation              AM-PAC PT "6 Clicks" Mobility   Outcome Measure  Help needed turning from your back to your side while in a flat bed without using bedrails?: None Help needed moving from lying on your back to sitting on the side of a flat bed without using bedrails?: A Little Help needed moving to and from a bed to a chair (including a wheelchair)?: A Little Help needed standing up from a chair using your arms (e.g., wheelchair or bedside chair)?: A Little Help needed to walk in hospital room?: A Little Help needed climbing 3-5 steps with a railing? : A Little 6 Click  Score: 19    End of Session Equipment Utilized During Treatment: Gait belt;Oxygen Activity Tolerance: Patient tolerated treatment well Patient left: in chair;with call bell/phone within reach Nurse Communication: Mobility status PT Visit Diagnosis: Unsteadiness on feet (R26.81);Muscle weakness (generalized) (M62.81);Other abnormalities of gait and mobility (R26.89)     Time: 9833-8250 PT Time Calculation (min) (ACUTE ONLY): 30 min  Charges:  $Gait Training: 8-22 mins $Therapeutic Activity: 8-22 mins                     [name] , PTA Acute Rehabilitation Services Pager [phone] Office [phone]     Deneane Stifter Artis Delay 06/29/2020, 4:03 PM

## 2020-06-29 NOTE — Progress Notes (Signed)
PROGRESS NOTE    MARIESA GRIEDER  UYQ:[id number] DOB: 03-Sep-1930 DOA: 07/03/2020 PCP: Kermit Balo, DO   Brief Narrative: COZETTA SEIF is a 86 y.o. female with medical history significant for paroxysmal atrial fibrillation on Eliquis, hypertension, hypothyroidism, GERD, and anxiety. Patient presented secondary to dyspnea with concern for heart failure exacerbation. Lasix started.   Assessment & Plan:   Principal Problem:   Acute respiratory failure with hypoxia and hypercapnia (HCC) Active Problems:   Hypothyroidism, adult   Essential hypertension   Anxiety state   Acute on chronic diastolic CHF (congestive heart failure) (HCC)   Pleural effusion, right   PAF (paroxysmal atrial fibrillation) (HCC)   Hyponatremia   Acute respiratory failure with hypoxia Appears secondary to heart failure/fluid overload with evidence of bilateral pleural effusions on chest x-ray. VBG significant for elevated pCO2 but unsure this constitutes true hypercapnia. Currently on 5L of oxygen via [place] -Wean to room as able -CT chest  Acute on chronic diastolic heart failure Last Transthoracic Echocardiogram from 12/2019 significant for grade 1 diastolic dysfunction. Diuresed with lasix. UOP of 2.5 L in the last 24 hours with 4 unmeasured occurrences. Initial weight of 52.2 kg. Weight of 65.8 kg today; unsure of accuracy of weights -BMP -Daily weights and strict in/out  Paroxysmal atrial fibrillation Currently in atrial fibrillation. Rate controlled. On Eliquis as an outpatient. -Continue Toprol-XL, Eliquis  Hyponatremia Unsure of etiology. Patient with likely heart failure with hypervolemia in addition to hydrochlorothiazide and sertraline use as an outpatient. Worsened with Lasix -BMP q12 hours -Urine sodium/osmolality -Nephrology consult if continues to decline  Primary hypertension -Continue irbesartan and amlodipine -Hold hydrochlorothiazide secondary to hyponatremia  Hypothyroidism TSH  of 1.71 -Continue Synthroid  GERD -Continue Protonix  DVT prophylaxis: Eliquis Code Status:   Code Status: DNR Family Communication: Daughter at bedside Disposition Plan: Discharge back to ILF in 3-5 days pending improvement of respiratory status in addition to hyponatremia. ILF with HH   Consultants:   None  Procedures:   None  Antimicrobials:  None    Subjective: Feeling anxious. Breathing not baseline.  Objective: Vitals:   06/29/20 0225 06/29/20 0420 06/29/20 0739 06/29/20 1100  BP: 138/74 122/83 135/70 140/88  Pulse: 73 72 60 72  Resp: 18 16 18 20   Temp: 98.8 F (37.1 C) 98.5 F (36.9 C) 98.6 F (37 C)   TempSrc: Axillary Oral Oral   SpO2: 93% 95% 93% 96%  Weight:  65.8 kg    Height:        Intake/Output Summary (Last 24 hours) at 06/29/2020 1249 Last data filed at 06/29/2020 0543 Gross per 24 hour  Intake 440 ml  Output 1950 ml  Net -1510 ml   Filed Weights   06/17/2020 2149 06/29/20 0420  Weight: 52.2 kg 65.8 kg    Examination:  General exam: Appears calm and comfortable  Respiratory system: Diminished bilaterally. Respiratory effort normal. Cardiovascular system: S1 & S2 heard, RRR. No murmurs, rubs, gallops or clicks. Gastrointestinal system: Abdomen is nondistended, soft and nontender. No organomegaly or masses felt. Normal bowel sounds heard. Central nervous system: Alert and oriented. No focal neurological deficits. Musculoskeletal: 2+ BLE edema. No calf tenderness Skin: No cyanosis. No rashes Psychiatry: Judgement and insight appear normal. Slightly anxious appearing    Data Reviewed: I have personally reviewed following labs and imaging studies  CBC Lab Results  Component Value Date   WBC 12.2 (H) 06/28/2020   RBC 4.66 06/28/2020   HGB 13.7 06/28/2020  HCT 41.5 06/28/2020   MCV 89.1 06/28/2020   MCH 29.4 06/28/2020   PLT 152 06/28/2020   MCHC 33.0 06/28/2020   RDW 12.8 06/28/2020   LYMPHSABS 0.7 06/23/2020   MONOABS 0.6  06/20/2020   EOSABS 0.0 06/26/2020   BASOSABS 0.0 07/07/2020     Last metabolic panel Lab Results  Component Value Date   NA 121 (L) 06/29/2020   K 3.8 06/29/2020   CL 78 (L) 06/29/2020   CO2 36 (H) 06/29/2020   BUN 15 06/29/2020   CREATININE 0.50 06/29/2020   GLUCOSE 106 (H) 06/29/2020   GFRNONAA >60 06/29/2020   GFRAA 66 01/14/2020   CALCIUM 8.8 (L) 06/29/2020   PHOS 2.1 (L) 08/02/2016   PROT 7.3 07/03/2020   ALBUMIN 4.1 07/12/2020   LABGLOB 2.3 07/30/2015   AGRATIO 1.7 07/30/2015   BILITOT 0.6 07/12/2020   ALKPHOS 115 07/02/2020   AST 24 06/17/2020   ALT 24 07/03/2020   ANIONGAP 7 06/29/2020    CBG (last 3)  Recent Labs    06/29/20 0811 06/29/20 1208  GLUCAP 114* 138*     GFR: Estimated Creatinine Clearance: 50 mL/min (by C-G formula based on SCr of 0.5 mg/dL).  Coagulation Profile: No results for input(s): INR, PROTIME in the last 168 hours.  Recent Results (from the past 240 hour(s))  Respiratory Panel by RT PCR (Flu A&B, Covid) - Nasopharyngeal Swab     Status: None   Collection Time: 06/20/2020 11:36 PM   Specimen: Nasopharyngeal Swab  Result Value Ref Range Status   SARS Coronavirus 2 by RT PCR NEGATIVE NEGATIVE Final    Comment: (NOTE) SARS-CoV-2 target nucleic acids are NOT DETECTED.  The SARS-CoV-2 RNA is generally detectable in upper respiratoy specimens during the acute phase of infection. The lowest concentration of SARS-CoV-2 viral copies this assay can detect is 131 copies/mL. A negative result does not preclude SARS-Cov-2 infection and should not be used as the sole basis for treatment or other patient management decisions. A negative result may occur with  improper specimen collection/handling, submission of specimen other than nasopharyngeal swab, presence of viral mutation(s) within the areas targeted by this assay, and inadequate number of viral copies (<131 copies/mL). A negative result must be combined with clinical observations,  patient history, and epidemiological information. The expected result is Negative.  Fact Sheet for Patients:  https://www.moore.com/https://www.fda.gov/media/142436/download  Fact Sheet for Healthcare Providers:  https://www.young.biz/https://www.fda.gov/media/142435/download  This test is no t yet approved or cleared by the [place] FDA and  has been authorized for detection and/or diagnosis of SARS-CoV-2 by FDA under an Emergency Use Authorization (EUA). This EUA will remain  in effect (meaning this test can be used) for the duration of the COVID-19 declaration under Section 564(b)(1) of the Act, 21 U.S.C. section 360bbb-3(b)(1), unless the authorization is terminated or revoked sooner.     Influenza A by PCR NEGATIVE NEGATIVE Final   Influenza B by PCR NEGATIVE NEGATIVE Final    Comment: (NOTE) The Xpert Xpress SARS-CoV-2/FLU/RSV assay is intended as an aid in  the diagnosis of influenza from Nasopharyngeal swab specimens and  should not be used as a sole basis for treatment. Nasal washings and  aspirates are unacceptable for Xpert Xpress SARS-CoV-2/FLU/RSV  testing.  Fact Sheet for Patients: https://www.moore.com/https://www.fda.gov/media/142436/download  Fact Sheet for Healthcare Providers: https://www.young.biz/https://www.fda.gov/media/142435/download  This test is not yet approved or cleared by the [place] FDA and  has been authorized for detection and/or diagnosis of SARS-CoV-2 by  FDA under an Emergency  Use Authorization (EUA). This EUA will remain  in effect (meaning this test can be used) for the duration of the  Covid-19 declaration under Section 564(b)(1) of the Act, 21  U.S.C. section 360bbb-3(b)(1), unless the authorization is  terminated or revoked. Performed at [facility], [address] [place], [place] [address]         Radiology Studies: DG Chest 2 View  Result Date: 07/05/20 CLINICAL DATA:  Dyspnea EXAM: CHEST - 2 VIEW COMPARISON:  11/17/2019 FINDINGS: Moderate right pleural effusion has developed with  compressive atelectasis of the right lung base. Small left pleural effusion has developed. The lungs are hyperinflated in keeping with underlying COPD. No pneumothorax. Cardiac size is within normal limits when accounting for moderate rotation on this examination. Pulmonary vascularity is normal. No acute bone abnormality. IMPRESSION: Interval development of bilateral pleural effusions, right greater than left, with associated compressive atelectasis. Electronically Signed   By: Helyn Numbers MD   On: 2020/07/05 22:42   DG CHEST PORT 1 VIEW  Result Date: 06/29/2020 CLINICAL DATA:  Hypoxia EXAM: PORTABLE CHEST 1 VIEW COMPARISON:  07/05/2020 FINDINGS: Pleural effusion on the right is stable. Pleural effusion on the left is slightly larger. There is ill-defined airspace opacity in the left mid lung and left lower lung regions, increased from 2 days prior. There is consolidation in the medial right base, stable. Heart is mildly with pulmonary vascularity normal. No adenopathy. There is aortic atherosclerosis. No bone lesions. IMPRESSION: Pleural effusions are currently present bilaterally, larger on the left and stable on the right. Consolidation medial right base is stable. Increase in airspace opacity left mid lung and left base regions, likely due to progression pneumonia in these areas. Stable mild cardiac prominence. Electronically Signed   By: Bretta Bang III M.D.   On: 06/29/2020 08:18   ECHOCARDIOGRAM COMPLETE  Result Date: 06/28/2020    ECHOCARDIOGRAM REPORT   Patient Name:   ELLOUISE MCWHIRTER Date of Exam: 06/28/2020 Medical Rec #:  [id number]     Height:       68.0 in Accession #:    [id number]    Weight:       115.1 lb Date of Birth:  October 16, 1930     BSA:          1.616 m Patient Age:    89 years      BP:           146/89 mmHg Patient Gender: F             HR:           79 bpm. Exam Location:  Inpatient Procedure: 2D Echo, Color Doppler and Cardiac Doppler Indications:    CHF-Acute  Diastolic 428.31 / I50.31  History:        Patient has prior history of Echocardiogram examinations, most                 recent 12/23/2019. Arrythmias:Atrial Fibrillation; Risk                 Factors:Hypertension and Dyslipidemia.  Sonographer:    Eulah Pont RDCS Referring Phys: [id number] VISHAL R PATEL IMPRESSIONS  1. Left ventricular ejection fraction, by estimation, is 60 to 65%. The left ventricle has normal function. The left ventricle has no regional wall motion abnormalities. Left ventricular diastolic function could not be evaluated.  2. Right ventricular systolic function is normal. The right ventricular size is normal. There is moderately elevated pulmonary  artery systolic pressure. The estimated right ventricular systolic pressure is 51.0 mmHg.  3. Left atrial size was severely dilated.  4. Right atrial size was severely dilated.  5. The mitral valve is myxomatous. Mild to moderate mitral valve regurgitation.  6. Tricuspid valve regurgitation is mild to moderate.  7. The aortic valve is tricuspid. Aortic valve regurgitation is not visualized. Mild aortic valve sclerosis is present, with no evidence of aortic valve stenosis. FINDINGS  Left Ventricle: Left ventricular ejection fraction, by estimation, is 60 to 65%. The left ventricle has normal function. The left ventricle has no regional wall motion abnormalities. The left ventricular internal cavity size was normal in size. There is  no left ventricular hypertrophy. Left ventricular diastolic function could not be evaluated due to atrial fibrillation. Left ventricular diastolic function could not be evaluated. Right Ventricle: The right ventricular size is normal. No increase in right ventricular wall thickness. Right ventricular systolic function is normal. There is moderately elevated pulmonary artery systolic pressure. The tricuspid regurgitant velocity is 3.28 m/s, and with an assumed right atrial pressure of 8 mmHg, the estimated right ventricular  systolic pressure is 51.0 mmHg. Left Atrium: Left atrial size was severely dilated. Right Atrium: Right atrial size was severely dilated. Pericardium: There is no evidence of pericardial effusion. Mitral Valve: The mitral valve is myxomatous. There is moderate thickening of the mitral valve leaflet(s). Mild mitral annular calcification. Mild to moderate mitral valve regurgitation, with centrally-directed jet. Tricuspid Valve: The tricuspid valve is normal in structure. Tricuspid valve regurgitation is mild to moderate. Aortic Valve: The aortic valve is tricuspid. Aortic valve regurgitation is not visualized. Mild aortic valve sclerosis is present, with no evidence of aortic valve stenosis. Pulmonic Valve: The pulmonic valve was normal in structure. Pulmonic valve regurgitation is trivial. Aorta: The aortic root and ascending aorta are structurally normal, with no evidence of dilitation. IAS/Shunts: No atrial level shunt detected by color flow Doppler.  LEFT VENTRICLE PLAX 2D LVIDd:         4.00 cm  Diastology LVIDs:         2.40 cm  LV e' medial:    8.43 cm/s LV PW:         1.00 cm  LV E/e' medial:  13.2 LV IVS:        0.80 cm  LV e' lateral:   7.14 cm/s LVOT diam:     2.00 cm  LV E/e' lateral: 15.5 LV SV:         58 LV SV Index:   36 LVOT Area:     3.14 cm  RIGHT VENTRICLE RV S prime:     8.17 cm/s TAPSE (M-mode): 1.3 cm LEFT ATRIUM             Index       RIGHT ATRIUM           Index LA diam:        4.80 cm 2.97 cm/m  RA Area:     22.90 cm LA Vol (A2C):   79.0 ml 48.89 ml/m RA Volume:   66.90 ml  41.40 ml/m LA Vol (A4C):   53.9 ml 33.36 ml/m LA Biplane Vol: 72.8 ml 45.05 ml/m  AORTIC VALVE LVOT Vmax:   84.80 cm/s LVOT Vmean:  56.500 cm/s LVOT VTI:    0.186 m  AORTA Ao Root diam: 2.90 cm Ao Asc diam:  3.10 cm MITRAL VALVE  TRICUSPID VALVE MV Area (PHT): 4.68 cm     TR Peak grad:   43.0 mmHg MV Decel Time: 162 msec     TR Vmax:        328.00 cm/s MR PISA:        0.25 cm MR PISA Radius: 0.20 cm      SHUNTS MV E velocity: 111.00 cm/s  Systemic VTI:  0.19 m MV A velocity: 59.70 cm/s   Systemic Diam: 2.00 cm MV E/A ratio:  1.86 Mihai Croitoru MD Electronically signed by Thurmon Fair MD Signature Date/Time: 06/28/2020/2:16:03 PM    Final         Scheduled Meds: . amLODipine  10 mg Oral QPM  . apixaban  2.5 mg Oral BID  . irbesartan  150 mg Oral Daily  . levothyroxine  100 mcg Oral Q0600  . melatonin  3 mg Oral QHS  . metoprolol succinate  100 mg Oral Daily  . pantoprazole  40 mg Oral Daily  . potassium chloride  20 mEq Oral Daily  . sodium chloride flush  3 mL Intravenous Q12H  . temazepam  30 mg Oral QHS   Continuous Infusions:   LOS: 1 day     Jacquelin Hawking, MD [facility] 06/29/2020, 12:49 PM  If 7PM-7AM, please contact night-coverage [email or web address]

## 2020-06-29 NOTE — Progress Notes (Signed)
Occupational Therapy Treatment Patient Details Name: [name] MRN: [id number] DOB: Jan 06, 1931 Today's Date: 06/29/2020    History of present illness This 70 admitted with SOB and cough.  Dx:  acute respiratory failrue with hypoxia, acute on chronic diastolic CHF, pleural effusions, hypnatremia, paroxysmal A-Fib.  PMH includes: unspecified tinnitus, hereditary ideopathic peripheral neuropathy, HTN, senile osteoporosis, OA, macular degeneration, diaphragmatic hernia, anxiety    OT comments  Pt progressing with OOB ADL going to bathroom and back to bed, stopping at sink with minA for cues for vision to reduce bumping into obstacles. Pt minguardA with RW and highly dependent on it. Pt >90% on 6L O2 when walking with RN student as OT approaching room. Pt would benefit from continued OT skilled services for ADL, mobility and safety in HHOT setting.     Follow Up Recommendations  Home health OT;Supervision/Assistance - 24 hour    Equipment Recommendations  None recommended by OT    Recommendations for Other Services      Precautions / Restrictions Precautions Precautions: None Restrictions Weight Bearing Restrictions: No       Mobility Bed Mobility Overal bed mobility: Needs Assistance Bed Mobility: Supine to Sit     Supine to sit: Min assist     General bed mobility comments: assist for trunk  Transfers Overall transfer level: Needs assistance Equipment used: Rolling walker (2 wheeled) Transfers: Sit to/from [employer] Sit to Stand: Min guard Stand pivot transfers: Min assist       General transfer comment: with RW    Balance Overall balance assessment: Needs assistance Sitting-balance support: Feet supported;No upper extremity supported Sitting balance-[name]: Good     Standing balance support: Single extremity supported Standing balance-[name]: Poor Standing balance comment: reliant on RW                            ADL either performed or assessed with clinical judgement   ADL Overall ADL's : Needs assistance/impaired     Grooming: Wash/dry face;Min guard;Sitting;Standing                       Toileting- Clothing Manipulation and Hygiene: Min guard;Cueing for safety;Cueing for sequencing;Sitting/lateral lean;Sit to/from Statistician Details (indicate cue type and reason): assist in standing     Functional mobility during ADLs: Min guard;Rolling walker General ADL Comments: Pt requiring increased assist for ADL and mobility, but pt exhibits close to baseline functioning.     Vision   Additional Comments: Requires assist for new environment   Perception     Praxis      Cognition Arousal/Alertness: Awake/alert Behavior During Therapy: WFL for tasks assessed/performed Overall Cognitive Status: Within Functional Limits for tasks assessed                                 General Comments: daughter reports pt is at her functional baseline        Exercises     Shoulder Instructions       General Comments Pt on 6L O2 when up in bathroom with nursing.    Pertinent Vitals/ Pain       Pain Assessment: No/denies pain  Home Living  Prior Functioning/Environment              Frequency  Min 2X/week        Progress Toward Goals  OT Goals(current goals can now be found in the care plan section)  Progress towards OT goals: Progressing toward goals  Acute Rehab OT Goals Patient Stated Goal: to go home  OT Goal Formulation: With patient Time For Goal Achievement: 07/05/20 Potential to Achieve Goals: Good ADL Goals Pt Will Perform Grooming: with supervision;standing Pt Will Perform Upper Body Bathing: with supervision;sitting Pt Will Perform Lower Body Bathing: with supervision;sit to/from stand Pt Will Perform Upper Body Dressing: with supervision;sitting Pt Will  Perform Lower Body Dressing: with supervision;sit to/from stand Pt Will Transfer to Toilet: with supervision;ambulating;regular height toilet Pt Will Perform Toileting - Clothing Manipulation and hygiene: with supervision  Plan Discharge plan remains appropriate    Co-evaluation                 AM-PAC OT "6 Clicks" Daily Activity     Outcome Measure   Help from another person eating meals?: None Help from another person taking care of personal grooming?: A Little Help from another person toileting, which includes using toliet, bedpan, or urinal?: A Little Help from another person bathing (including washing, rinsing, drying)?: A Little Help from another person to put on and taking off regular upper body clothing?: A Little Help from another person to put on and taking off regular lower body clothing?: A Little 6 Click Score: 19    End of Session Equipment Utilized During Treatment: Gait belt;Oxygen  OT Visit Diagnosis: Unsteadiness on feet (R26.81)   Activity Tolerance Patient tolerated treatment well   Patient Left in bed;with call bell/phone within reach;with bed alarm set   Nurse Communication Mobility status        Time: 1020-1035 OT Time Calculation (min): 15 min  Charges: OT General Charges $OT Visit: 1 Visit OT Treatments $Self Care/Home Management : 8-22 mins  Flora Lipps, OTR/L Acute Rehabilitation Services Pager: [phone] Office: [phone]    Arren Laminack C 06/29/2020, 3:17 PM

## 2020-06-29 NOTE — Progress Notes (Signed)
PT Cancellation Note  Patient Details Name: [name] MRN: [id number] DOB: 06-Dec-1930   Cancelled Treatment:    Reason Eval/Treat Not Completed: (P) Other (comment) (Pt awaiting transfer for CT scan will defer PT at this time and follow up per POC.)   Donnamaria Shands Artis Delay 06/29/2020, 1:04 PM [name] , PTA Acute Rehabilitation Services Pager [phone] Office [phone]

## 2020-06-30 DIAGNOSIS — F411 Generalized anxiety disorder: Secondary | ICD-10-CM | POA: Diagnosis not present

## 2020-06-30 DIAGNOSIS — I5033 Acute on chronic diastolic (congestive) heart failure: Secondary | ICD-10-CM | POA: Diagnosis not present

## 2020-06-30 DIAGNOSIS — I10 Essential (primary) hypertension: Secondary | ICD-10-CM | POA: Diagnosis not present

## 2020-06-30 DIAGNOSIS — J9601 Acute respiratory failure with hypoxia: Secondary | ICD-10-CM | POA: Diagnosis not present

## 2020-06-30 LAB — BASIC METABOLIC PANEL
Anion gap: 8 (ref 5–15)
Anion gap: 9 (ref 5–15)
BUN: 14 mg/dL (ref 8–23)
BUN: 13 mg/dL (ref 8–23)
CO2: 36 mmol/L — ABNORMAL HIGH (ref 22–32)
CO2: 35 mmol/L — ABNORMAL HIGH (ref 22–32)
Calcium: 9.3 mg/dL (ref 8.9–10.3)
Calcium: 9.2 mg/dL (ref 8.9–10.3)
Chloride: 80 mmol/L — ABNORMAL LOW (ref 98–111)
Chloride: 79 mmol/L — ABNORMAL LOW (ref 98–111)
Creatinine, Ser: 0.55 mg/dL (ref 0.44–1.00)
Creatinine, Ser: 0.51 mg/dL (ref 0.44–1.00)
GFR, Estimated: >60 mL/min (ref >60)
GFR, Estimated: >60 mL/min (ref >60)
Glucose, Bld: 104 mg/dL — ABNORMAL HIGH (ref 70–99)
Glucose, Bld: 168 mg/dL — ABNORMAL HIGH (ref 70–99)
Potassium: 4.8 mmol/L (ref 3.5–5.1)
Potassium: 5 mmol/L (ref 3.5–5.1)
Sodium: 124 mmol/L — ABNORMAL LOW (ref 135–145)
Sodium: 123 mmol/L — ABNORMAL LOW (ref 135–145)

## 2020-06-30 MED ORDER — AMOXICILLIN-POT CLAVULANATE 875-125 MG PO TABS
1.0000 | ORAL_TABLET | Freq: Two times a day (BID) | ORAL | Status: DC
  Administered 2020-06-30 – 2020-07-01 (×3): 1 via ORAL
  Filled 2020-06-30 (×3): qty 1

## 2020-06-30 MED ORDER — SERTRALINE HCL 50 MG PO TABS
50.0000 mg | ORAL_TABLET | Freq: Every morning | ORAL | Status: DC
  Administered 2020-06-30 – 2020-07-10 (×11): 50 mg via ORAL
  Filled 2020-06-30 (×11): qty 1

## 2020-06-30 NOTE — Progress Notes (Signed)
Physical Therapy Treatment Patient Details Name: [name] MRN: [id number] DOB: 04-27-1931 Today's Date: 06/30/2020    History of Present Illness This 58 admitted with SOB and cough.  Dx:  acute respiratory failrue with hypoxia, acute on chronic diastolic CHF, pleural effusions, hypnatremia, paroxysmal A-Fib.  PMH includes: unspecified tinnitus, hereditary ideopathic peripheral neuropathy, HTN, senile osteoporosis, OA, macular degeneration, diaphragmatic hernia, anxiety     PT Comments    Pt supine in bed on arrival this session.  Pt reports ," I keep belly breathing and I don't like it."  Pt on 7LNC so PTA turned down to 6L [name] as she was not on a HFNC.  Post session she continued to required increased rate of O2 so switched patient to HFNC with humidifier.  Pt resting on 7lpm HFNC.  Continue to recommend return home with HHPT.  Supervising PT present during change in O2 tubing from standard [place] to HFNC.     Follow Up Recommendations  Home health PT;Supervision/Assistance - 24 hour     Equipment Recommendations  None recommended by PT    Recommendations for Other Services       Precautions / Restrictions Precautions Precautions: None Restrictions Weight Bearing Restrictions: No Other Position/Activity Restrictions: poor vision - macular degeneration at baseline    Mobility  Bed Mobility Overal bed mobility: Needs Assistance Bed Mobility: Supine to Sit     Supine to sit: Supervision        Transfers Overall transfer level: Needs assistance Equipment used: 4-wheeled walker Transfers: Sit to/from Stand Sit to Stand: Min guard         General transfer comment: Cues for hand placement to and from seated surface.  Pt pulling on rollator to rise into standing.  Pt required cues for locking rollator brakes for safety.  Ambulation/Gait Ambulation/Gait assistance: Min guard Gait Distance (Feet): 80 Feet Assistive device: 4-wheeled walker Gait Pattern/deviations:  Step-through pattern;Decreased step length - right;Decreased step length - left;Trunk flexed Gait velocity: Increased cadence noted this session.   General Gait Details: Cues for pacing and safety with use of rollator.  Difficult to get SPO2 reading with movement today.  Pre gt 91% 6L [name], post gt 85% 6L .  Switched patient to HFNC this pm to allow for increased O2 rate during recovery.   Stairs             Wheelchair Mobility    Modified Rankin (Stroke Patients Only)       Balance Overall balance assessment: Needs assistance Sitting-balance support: Feet supported;No upper extremity supported Sitting balance-[name]: Good       Standing balance-[name]: Poor Standing balance comment: reliant on RW                            Cognition Arousal/Alertness: Awake/alert Behavior During Therapy: WFL for tasks assessed/performed Overall Cognitive Status: Within Functional Limits for tasks assessed                                        Exercises      General Comments        Pertinent Vitals/Pain Pain Assessment: No/denies pain    Home Living                      Prior Function  PT Goals (current goals can now be found in the care plan section) Acute Rehab PT Goals Patient Stated Goal: to go home  Potential to Achieve Goals: Good Progress towards PT goals: Progressing toward goals    Frequency    Min 3X/week      PT Plan Current plan remains appropriate    Co-evaluation              AM-PAC PT "6 Clicks" Mobility   Outcome Measure  Help needed turning from your back to your side while in a flat bed without using bedrails?: None Help needed moving from lying on your back to sitting on the side of a flat bed without using bedrails?: A Little Help needed moving to and from a bed to a chair (including a wheelchair)?: A Little Help needed standing up from a chair using your arms (e.g.,  wheelchair or bedside chair)?: A Little Help needed to walk in hospital room?: A Little Help needed climbing 3-5 steps with a railing? : A Little 6 Click Score: 19    End of Session Equipment Utilized During Treatment: Gait belt;Oxygen Activity Tolerance: Patient tolerated treatment well;Patient limited by fatigue Patient left: in chair;with call bell/phone within reach;with chair alarm set Nurse Communication: Mobility status PT Visit Diagnosis: Unsteadiness on feet (R26.81);Muscle weakness (generalized) (M62.81);Other abnormalities of gait and mobility (R26.89)     Time: 2536-6440 PT Time Calculation (min) (ACUTE ONLY): 42 min  Charges:  $Gait Training: 8-22 mins $Therapeutic Activity: 23-37 mins                     [name] , PTA Acute Rehabilitation Services Pager [phone] Office [phone]     [name] 06/30/2020, 5:05 PM

## 2020-06-30 NOTE — Plan of Care (Signed)

## 2020-06-30 NOTE — Progress Notes (Signed)
PROGRESS NOTE    [name]  ZOX:[id number] DOB: Aug 21, 1930 DOA: 09/05/2019 PCP: Kermit Baloeed, Tiffany L, DO   Brief Narrative: [name] is a 86 y.o. female with medical history significant for paroxysmal atrial fibrillation on Eliquis, hypertension, hypothyroidism, GERD, and anxiety. Patient presented secondary to dyspnea with concern for heart failure exacerbation. Lasix started.   Assessment & Plan:   Principal Problem:   Acute respiratory failure with hypoxia and hypercapnia (HCC) Active Problems:   Hypothyroidism, adult   Essential hypertension   Anxiety state   Acute on chronic diastolic CHF (congestive heart failure) (HCC)   Pleural effusion, right   PAF (paroxysmal atrial fibrillation) (HCC)   Hyponatremia   Acute respiratory failure with hypoxia Initially appeared secondary to heart failure/fluid overload with evidence of bilateral pleural effusions on chest x-ray. CT scan on 11/15 suggests aspiration picture. VBG significant for elevated pCO2 but unsure this constitutes true hypercapnia. Currently on 6 L of oxygen via [place].  edema. -Wean to room as able -Flutter valve  Acute on chronic diastolic heart failure Last Transthoracic Echocardiogram from 12/2019 significant for grade 1 diastolic dysfunction. Diuresed with lasix. UOP of 500  ML with several unmeasured occurences in the last 24 hours. Initial weight of 52.2 kg. Weight of 56.7 kg today -BMP -Daily weights and strict in/out -Hold lasix  Aspiration pneumonia Likely etiology of respiratory failure. SLP evaluated and no changes to diet recommendations. -Augmentin BID -Flutter valve  Paroxysmal atrial fibrillation Currently in atrial fibrillation. Rate controlled. On Eliquis as an outpatient. -Continue Toprol-XL, Eliquis  Hyponatremia Unsure of etiology. Patient with likely heart failure with hypervolemia in addition to hydrochlorothiazide and sertraline use as an outpatient. Worsened with Lasix. Urine sties  suggests possible volume depletion contributing. Improving now. -BMP q12 hours -Hold lasix  Primary hypertension -Continue irbesartan and amlodipine -Hold hydrochlorothiazide secondary to hyponatremia  Hypothyroidism TSH of 1.71 -Continue Synthroid  GERD -Continue Protonix  Anxiety -Continue Xanax and temazepam -Restart Zoloft  DVT prophylaxis: Eliquis Code Status:   Code Status: DNR Family Communication: Daughter at bedside Disposition Plan: Discharge back to ILF in 2-5 days pending improvement of respiratory status in addition to hyponatremia. ILF with HH   Consultants:   None  Procedures:   None  Antimicrobials:  None    Subjective: Some cough but not as wet as before. Dyspnea.  Objective: Vitals:   06/30/20 0423 06/30/20 0723 06/30/20 0917 06/30/20 1045  BP: 128/72 131/72 126/72 (!) 145/71  Pulse: 77 82 85 86  Resp: 19 19    Temp: 97.6 F (36.4 C) 97.7 F (36.5 C)  (!) 97 F (36.1 C)  TempSrc: Oral Oral  Oral  SpO2: 100% 97%  96%  Weight: 56.7 kg     Height:        Intake/Output Summary (Last 24 hours) at 06/30/2020 1051 Last data filed at 06/30/2020 0800 Gross per 24 hour  Intake 800 ml  Output 800 ml  Net 0 ml   Filed Weights   2020-04-13 2149 06/29/20 0420 06/30/20 0423  Weight: 52.2 kg 65.8 kg 56.7 kg    Examination:  General exam: Appears calm and comfortable Respiratory system: Left side with mid rhonchi with none noticed on right. Respiratory effort normal. Cardiovascular system: S1 & S2 heard, Irregular rhythm with normal rate. No murmurs, rubs, gallops or clicks. Gastrointestinal system: Abdomen is nondistended, soft and nontender. No organomegaly or masses felt. Normal bowel sounds heard. Central nervous system: Alert and oriented. No focal neurological deficits.  Musculoskeletal: BLE edema. No calf tenderness Skin: No cyanosis. No rashes Psychiatry: Judgement and insight appear normal. Mood & affect appropriate.     Data  Reviewed: I have personally reviewed following labs and imaging studies  CBC Lab Results  Component Value Date   WBC 12.2 (H) 06/28/2020   RBC 4.66 06/28/2020   HGB 13.7 06/28/2020   HCT 41.5 06/28/2020   MCV 89.1 06/28/2020   MCH 29.4 06/28/2020   PLT 152 06/28/2020   MCHC 33.0 06/28/2020   RDW 12.8 06/28/2020   LYMPHSABS 0.7 06/29/2020   MONOABS 0.6 07/03/2020   EOSABS 0.0 06/24/2020   BASOSABS 0.0 06/17/2020     Last metabolic panel Lab Results  Component Value Date   NA 124 (L) 06/30/2020   K 4.8 06/30/2020   CL 80 (L) 06/30/2020   CO2 36 (H) 06/30/2020   BUN 14 06/30/2020   CREATININE 0.55 06/30/2020   GLUCOSE 104 (H) 06/30/2020   GFRNONAA >60 06/30/2020   GFRAA 66 01/14/2020   CALCIUM 9.3 06/30/2020   PHOS 2.1 (L) 08/02/2016   PROT 7.3 06/21/2020   ALBUMIN 4.1 07/08/2020   LABGLOB 2.3 07/30/2015   AGRATIO 1.7 07/30/2015   BILITOT 0.6 07/12/2020   ALKPHOS 115 07/09/2020   AST 24 07/12/2020   ALT 24 06/15/2020   ANIONGAP 8 06/30/2020    CBG (last 3)  Recent Labs    06/29/20 0811 06/29/20 1208  GLUCAP 114* 138*     GFR: Estimated Creatinine Clearance: 44.3 mL/min (by C-G formula based on SCr of 0.55 mg/dL).  Coagulation Profile: No results for input(s): INR, PROTIME in the last 168 hours.  Recent Results (from the past 240 hour(s))  Respiratory Panel by RT PCR (Flu A&B, Covid) - Nasopharyngeal Swab     Status: None   Collection Time: 06/23/2020 11:36 PM   Specimen: Nasopharyngeal Swab  Result Value Ref Range Status   SARS Coronavirus 2 by RT PCR NEGATIVE NEGATIVE Final    Comment: (NOTE) SARS-CoV-2 target nucleic acids are NOT DETECTED.  The SARS-CoV-2 RNA is generally detectable in upper respiratoy specimens during the acute phase of infection. The lowest concentration of SARS-CoV-2 viral copies this assay can detect is 131 copies/mL. A negative result does not preclude SARS-Cov-2 infection and should not be used as the sole basis for  treatment or other patient management decisions. A negative result may occur with  improper specimen collection/handling, submission of specimen other than nasopharyngeal swab, presence of viral mutation(s) within the areas targeted by this assay, and inadequate number of viral copies (<131 copies/mL). A negative result must be combined with clinical observations, patient history, and epidemiological information. The expected result is Negative.  Fact Sheet for Patients:  https://www.moore.com/  Fact Sheet for Healthcare Providers:  https://www.young.biz/  This test is no t yet approved or cleared by the [place] FDA and  has been authorized for detection and/or diagnosis of SARS-CoV-2 by FDA under an Emergency Use Authorization (EUA). This EUA will remain  in effect (meaning this test can be used) for the duration of the COVID-19 declaration under Section 564(b)(1) of the Act, 21 U.S.C. section 360bbb-3(b)(1), unless the authorization is terminated or revoked sooner.     Influenza A by PCR NEGATIVE NEGATIVE Final   Influenza B by PCR NEGATIVE NEGATIVE Final    Comment: (NOTE) The Xpert Xpress SARS-CoV-2/FLU/RSV assay is intended as an aid in  the diagnosis of influenza from Nasopharyngeal swab specimens and  should not be used as a  sole basis for treatment. Nasal washings and  aspirates are unacceptable for Xpert Xpress SARS-CoV-2/FLU/RSV  testing.  Fact Sheet for Patients: https://www.moore.com/  Fact Sheet for Healthcare Providers: https://www.young.biz/  This test is not yet approved or cleared by the [place] FDA and  has been authorized for detection and/or diagnosis of SARS-CoV-2 by  FDA under an Emergency Use Authorization (EUA). This EUA will remain  in effect (meaning this test can be used) for the duration of the  Covid-19 declaration under Section 564(b)(1) of the Act, 21    U.S.C. section 360bbb-3(b)(1), unless the authorization is  terminated or revoked. Performed at [facility], [address], [place], [place] [address]   Respiratory Panel by PCR     Status: None   Collection Time: 06/29/20  3:29 PM   Specimen: Nasopharyngeal Swab; Respiratory  Result Value Ref Range Status   Adenovirus NOT DETECTED NOT DETECTED Final   Coronavirus 229E NOT DETECTED NOT DETECTED Final    Comment: (NOTE) The Coronavirus on the Respiratory Panel, DOES NOT test for the novel  Coronavirus (2019 nCoV)    Coronavirus HKU1 NOT DETECTED NOT DETECTED Final   Coronavirus NL63 NOT DETECTED NOT DETECTED Final   Coronavirus OC43 NOT DETECTED NOT DETECTED Final   Metapneumovirus NOT DETECTED NOT DETECTED Final   Rhinovirus / Enterovirus NOT DETECTED NOT DETECTED Final   Influenza A NOT DETECTED NOT DETECTED Final   Influenza B NOT DETECTED NOT DETECTED Final   Parainfluenza Virus 1 NOT DETECTED NOT DETECTED Final   Parainfluenza Virus 2 NOT DETECTED NOT DETECTED Final   Parainfluenza Virus 3 NOT DETECTED NOT DETECTED Final   Parainfluenza Virus 4 NOT DETECTED NOT DETECTED Final   Respiratory Syncytial Virus NOT DETECTED NOT DETECTED Final   Bordetella pertussis NOT DETECTED NOT DETECTED Final   Chlamydophila pneumoniae NOT DETECTED NOT DETECTED Final   Mycoplasma pneumoniae NOT DETECTED NOT DETECTED Final    Comment: Performed at [facility], [address], [place], [place] [address]        Radiology Studies: CT CHEST WO CONTRAST  Result Date: 06/29/2020 CLINICAL DATA:  Abnormal radiograph. Pleural effusion. Pneumonia. Possible aspiration clinically. EXAM: CT CHEST WITHOUT CONTRAST TECHNIQUE: Multidetector CT imaging of the chest was performed following the standard protocol without IV contrast. COMPARISON:  Plain films including most recent of earlier today. CTA chest 08/02/2016 FINDINGS: Cardiovascular: Aortic atherosclerosis. Moderate to marked  cardiomegaly with biatrial enlargement. Multivessel coronary artery atherosclerosis. Mediastinum/Nodes: 1.1 cm precarinal node on 60/3. Hilar regions poorly evaluated without intravenous contrast. Lungs/Pleura: Small bilateral pleural effusions. Fluid or debris within the right sided endobronchial tree, including the bronchus intermedius and more peripheral branches, including on 72/4. Biapical pleuroparenchymal scarring. Minimal motion degradation inferiorly. Bilateral lower lobe and left upper lobe areas of airspace, ground-glass, and scattered "tree-in-bud" nodularity. In the right upper lobe, subpleural areas of mild airspace and ground-glass medially. Somewhat more confluent consolidation in the dependent right lower lobe and right infrahilar central right lower lobe. Upper Abdomen: Normal imaged portions of the liver, spleen, kidneys, right adrenal gland. Musculoskeletal: Accentuation of expected thoracic kyphosis. Convex left thoracic spine curvature is mild. IMPRESSION: 1. Multifocal pulmonary opacities, infection and/or aspiration. 2. Fluid in the right-sided endobronchial tree, for which aspiration should be excluded clinically. 3. Small bilateral pleural effusions. 4. Mild mediastinal adenopathy, favored to be reactive. 5. Minimal motion degradation inferiorly. 6. Aortic Atherosclerosis (ICD10-I70.0). Coronary artery atherosclerosis. Electronically Signed   By: Hosie Spangle.D.  On: 06/29/2020 13:58   DG CHEST PORT 1 VIEW  Result Date: 06/29/2020 CLINICAL DATA:  Hypoxia EXAM: PORTABLE CHEST 1 VIEW COMPARISON:  June 27, 2020 FINDINGS: Pleural effusion on the right is stable. Pleural effusion on the left is slightly larger. There is ill-defined airspace opacity in the left mid lung and left lower lung regions, increased from 2 days prior. There is consolidation in the medial right base, stable. Heart is mildly with pulmonary vascularity normal. No adenopathy. There is aortic atherosclerosis. No  bone lesions. IMPRESSION: Pleural effusions are currently present bilaterally, larger on the left and stable on the right. Consolidation medial right base is stable. Increase in airspace opacity left mid lung and left base regions, likely due to progression pneumonia in these areas. Stable mild cardiac prominence. Electronically Signed   By: Bretta Bang III M.D.   On: 06/29/2020 08:18   ECHOCARDIOGRAM COMPLETE  Result Date: 06/28/2020    ECHOCARDIOGRAM REPORT   Patient Name:   [name] Date of Exam: 06/28/2020 Medical Rec #:  [id number]     Height:       68.0 in Accession #:    [id number]    Weight:       115.1 lb Date of Birth:  [birth year]     BSA:          1.616 m Patient Age:    86 years      BP:           146/89 mmHg Patient Gender: F             HR:           79 bpm. Exam Location:  Inpatient Procedure: 2D Echo, Color Doppler and Cardiac Doppler Indications:    CHF-Acute Diastolic 428.31 / I50.31  History:        Patient has prior history of Echocardiogram examinations, most                 recent 12/23/2019. Arrythmias:Atrial Fibrillation; Risk                 Factors:Hypertension and Dyslipidemia.  Sonographer:    Eulah Pont RDCS Referring Phys: [id number] VISHAL R PATEL IMPRESSIONS  1. Left ventricular ejection fraction, by estimation, is 60 to 65%. The left ventricle has normal function. The left ventricle has no regional wall motion abnormalities. Left ventricular diastolic function could not be evaluated.  2. Right ventricular systolic function is normal. The right ventricular size is normal. There is moderately elevated pulmonary artery systolic pressure. The estimated right ventricular systolic pressure is 51.0 mmHg.  3. Left atrial size was severely dilated.  4. Right atrial size was severely dilated.  5. The mitral valve is myxomatous. Mild to moderate mitral valve regurgitation.  6. Tricuspid valve regurgitation is mild to moderate.  7. The aortic valve is tricuspid. Aortic valve  regurgitation is not visualized. Mild aortic valve sclerosis is present, with no evidence of aortic valve stenosis. FINDINGS  Left Ventricle: Left ventricular ejection fraction, by estimation, is 60 to 65%. The left ventricle has normal function. The left ventricle has no regional wall motion abnormalities. The left ventricular internal cavity size was normal in size. There is  no left ventricular hypertrophy. Left ventricular diastolic function could not be evaluated due to atrial fibrillation. Left ventricular diastolic function could not be evaluated. Right Ventricle: The right ventricular size is normal. No increase in right ventricular wall thickness. Right ventricular systolic function is normal. There is moderately  elevated pulmonary artery systolic pressure. The tricuspid regurgitant velocity is 3.28 m/s, and with an assumed right atrial pressure of 8 mmHg, the estimated right ventricular systolic pressure is 51.0 mmHg. Left Atrium: Left atrial size was severely dilated. Right Atrium: Right atrial size was severely dilated. Pericardium: There is no evidence of pericardial effusion. Mitral Valve: The mitral valve is myxomatous. There is moderate thickening of the mitral valve leaflet(s). Mild mitral annular calcification. Mild to moderate mitral valve regurgitation, with centrally-directed jet. Tricuspid Valve: The tricuspid valve is normal in structure. Tricuspid valve regurgitation is mild to moderate. Aortic Valve: The aortic valve is tricuspid. Aortic valve regurgitation is not visualized. Mild aortic valve sclerosis is present, with no evidence of aortic valve stenosis. Pulmonic Valve: The pulmonic valve was normal in structure. Pulmonic valve regurgitation is trivial. Aorta: The aortic root and ascending aorta are structurally normal, with no evidence of dilitation. IAS/Shunts: No atrial level shunt detected by color flow Doppler.  LEFT VENTRICLE PLAX 2D LVIDd:         4.00 cm  Diastology LVIDs:          2.40 cm  LV e' medial:    8.43 cm/s LV PW:         1.00 cm  LV E/e' medial:  13.2 LV IVS:        0.80 cm  LV e' lateral:   7.14 cm/s LVOT diam:     2.00 cm  LV E/e' lateral: 15.5 LV SV:         58 LV SV Index:   36 LVOT Area:     3.14 cm  RIGHT VENTRICLE RV S prime:     8.17 cm/s TAPSE (M-mode): 1.3 cm LEFT ATRIUM             Index       RIGHT ATRIUM           Index LA diam:        4.80 cm 2.97 cm/m  RA Area:     22.90 cm LA Vol (A2C):   79.0 ml 48.89 ml/m RA Volume:   66.90 ml  41.40 ml/m LA Vol (A4C):   53.9 ml 33.36 ml/m LA Biplane Vol: 72.8 ml 45.05 ml/m  AORTIC VALVE LVOT Vmax:   84.80 cm/s LVOT Vmean:  56.500 cm/s LVOT VTI:    0.186 m  AORTA Ao Root diam: 2.90 cm Ao Asc diam:  3.10 cm MITRAL VALVE                TRICUSPID VALVE MV Area (PHT): 4.68 cm     TR Peak grad:   43.0 mmHg MV Decel Time: 162 msec     TR Vmax:        328.00 cm/s MR PISA:        0.25 cm MR PISA Radius: 0.20 cm     SHUNTS MV E velocity: 111.00 cm/s  Systemic VTI:  0.19 m MV A velocity: 59.70 cm/s   Systemic Diam: 2.00 cm MV E/A ratio:  1.86 Mihai Croitoru MD Electronically signed by Thurmon Fair MD Signature Date/Time: 06/28/2020/2:16:03 PM    Final         Scheduled Meds: . amLODipine  10 mg Oral QPM  . apixaban  2.5 mg Oral BID  . irbesartan  150 mg Oral Daily  . levothyroxine  100 mcg Oral Q0600  . melatonin  3 mg Oral QHS  . metoprolol succinate  100 mg Oral Daily  . pantoprazole  40 mg Oral Daily  . potassium chloride  20 mEq Oral Daily  . sertraline  50 mg Oral q AM  . sodium chloride flush  3 mL Intravenous Q12H  . temazepam  30 mg Oral QHS   Continuous Infusions:   LOS: 2 days     Jacquelin Hawking, MD [facility] 06/30/2020, 10:51 AM  If 7PM-7AM, please contact night-coverage [email or web address]

## 2020-07-01 ENCOUNTER — Encounter (INDEPENDENT_AMBULATORY_CARE_PROVIDER_SITE_OTHER): Payer: Medicare Other | Admitting: Ophthalmology

## 2020-07-01 ENCOUNTER — Inpatient Hospital Stay (HOSPITAL_COMMUNITY): Payer: Medicare Other

## 2020-07-01 DIAGNOSIS — I5033 Acute on chronic diastolic (congestive) heart failure: Secondary | ICD-10-CM | POA: Diagnosis not present

## 2020-07-01 DIAGNOSIS — J189 Pneumonia, unspecified organism: Secondary | ICD-10-CM

## 2020-07-01 DIAGNOSIS — J690 Pneumonitis due to inhalation of food and vomit: Secondary | ICD-10-CM

## 2020-07-01 DIAGNOSIS — J9601 Acute respiratory failure with hypoxia: Secondary | ICD-10-CM | POA: Diagnosis not present

## 2020-07-01 DIAGNOSIS — I10 Essential (primary) hypertension: Secondary | ICD-10-CM | POA: Diagnosis not present

## 2020-07-01 DIAGNOSIS — R0600 Dyspnea, unspecified: Secondary | ICD-10-CM

## 2020-07-01 DIAGNOSIS — E871 Hypo-osmolality and hyponatremia: Secondary | ICD-10-CM | POA: Diagnosis not present

## 2020-07-01 LAB — BASIC METABOLIC PANEL
Anion gap: 8 (ref 5–15)
BUN: 13 mg/dL (ref 8–23)
CO2: 35 mmol/L — ABNORMAL HIGH (ref 22–32)
Calcium: 9.2 mg/dL (ref 8.9–10.3)
Chloride: 80 mmol/L — ABNORMAL LOW (ref 98–111)
Creatinine, Ser: 0.48 mg/dL (ref 0.44–1.00)
GFR, Estimated: >60 mL/min (ref >60)
Glucose, Bld: 99 mg/dL (ref 70–99)
Potassium: 5.2 mmol/L — ABNORMAL HIGH (ref 3.5–5.1)
Sodium: 123 mmol/L — ABNORMAL LOW (ref 135–145)

## 2020-07-01 LAB — CBC
HCT: 39 % (ref 36.0–46.0)
Hemoglobin: 12.9 g/dL (ref 12.0–15.0)
MCH: 29.4 pg (ref 26.0–34.0)
MCHC: 33.1 g/dL (ref 30.0–36.0)
MCV: 88.8 fL (ref 80.0–100.0)
Platelets: 134 10*3/uL — ABNORMAL LOW (ref 150–400)
RBC: 4.39 MIL/uL (ref 3.87–5.11)
RDW: 13.1 % (ref 11.5–15.5)
WBC: 11 10*3/uL — ABNORMAL HIGH (ref 4.0–10.5)
nRBC: 0 % (ref 0.0–0.2)

## 2020-07-01 MED ORDER — METOPROLOL SUCCINATE ER 100 MG PO TB24
100.0000 mg | ORAL_TABLET | Freq: Every morning | ORAL | Status: DC
  Administered 2020-07-02 – 2020-07-03 (×2): 100 mg via ORAL
  Filled 2020-07-01 (×3): qty 1

## 2020-07-01 MED ORDER — FUROSEMIDE 10 MG/ML IJ SOLN
60.0000 mg | Freq: Two times a day (BID) | INTRAMUSCULAR | Status: DC
  Administered 2020-07-01 – 2020-07-03 (×5): 60 mg via INTRAVENOUS
  Filled 2020-07-01 (×5): qty 6

## 2020-07-01 MED ORDER — SODIUM CHLORIDE 0.9 % IV SOLN
3.0000 g | Freq: Three times a day (TID) | INTRAVENOUS | Status: AC
  Administered 2020-07-01 – 2020-07-07 (×19): 3 g via INTRAVENOUS
  Filled 2020-07-01: qty 3
  Filled 2020-07-01: qty 8
  Filled 2020-07-01 (×3): qty 3
  Filled 2020-07-01 (×3): qty 8
  Filled 2020-07-01 (×3): qty 3
  Filled 2020-07-01: qty 8
  Filled 2020-07-01 (×2): qty 3
  Filled 2020-07-01 (×5): qty 8
  Filled 2020-07-01: qty 3

## 2020-07-01 MED ORDER — METOPROLOL SUCCINATE ER 50 MG PO TB24
50.0000 mg | ORAL_TABLET | Freq: Every evening | ORAL | Status: DC
  Administered 2020-07-01 – 2020-07-10 (×10): 50 mg via ORAL
  Filled 2020-07-01 (×10): qty 1

## 2020-07-01 NOTE — Progress Notes (Signed)
Occupational Therapy Treatment Patient Details Name: [name] MRN: [id number] DOB: 1930-10-09 Today's Date: 07/01/2020    History of present illness This 22 admitted with SOB and cough.  Dx:  acute respiratory failrue with hypoxia, acute on chronic diastolic CHF, pleural effusions, hypnatremia, paroxysmal A-Fib.  PMH includes: unspecified tinnitus, hereditary ideopathic peripheral neuropathy, HTN, senile osteoporosis, OA, macular degeneration, diaphragmatic hernia, anxiety    OT comments  Pt progressing well with therapy sessions focusing on ADL and compensatory strategies for low vision and safety with mobility. Pt minguardA for ADL, mobility and good safety awareness. Pt stnading at sink x5 mins and performing own Pt requiring 8-10L O2 with questionable wave forms at times. HR increased to 80 BPM with exertion. OT following acutely in order to maximize independence before d/c.    Follow Up Recommendations  Home health OT;Supervision - Intermittent    Equipment Recommendations  None recommended by OT    Recommendations for Other Services      Precautions / Restrictions Precautions Precautions: None Restrictions Weight Bearing Restrictions: No       Mobility Bed Mobility Overal bed mobility: Needs Assistance Bed Mobility: Supine to Sit     Supine to sit: Supervision        Transfers Overall transfer level: Needs assistance Equipment used: Rolling walker (2 wheeled) Transfers: Sit to/from Stand Sit to Stand: Min guard         General transfer comment: Pt performing minguardA with RW    Balance Overall balance assessment: Needs assistance Sitting-balance support: Feet supported;No upper extremity supported Sitting balance-[name]: Good       Standing balance-[name]: Poor Standing balance comment: reliant on RW                           ADL either performed or assessed with clinical judgement   ADL Overall ADL's : Needs  assistance/impaired     Grooming: Min guard;Standing Grooming Details (indicate cue type and reason): standing at sink             Lower Body Dressing: Min guard;Sitting/lateral leans;Sit to/from stand Lower Body Dressing Details (indicate cue type and reason): Pt standing at sink with no physical assist; holding on with single UE. Toilet Transfer: Min guard;Ambulation;Comfort height toilet;RW;Grab bars Toilet Transfer Details (indicate cue type and reason): no physical assist Toileting- Clothing Manipulation and Hygiene: Min guard;Cueing for safety;Cueing for sequencing;Sitting/lateral lean;Sit to/from stand Toileting - Clothing Manipulation Details (indicate cue type and reason): assist in standing     Functional mobility during ADLs: Min guard;Rolling walker General ADL Comments: Pt minguardA for ADL, mobility and good safety awareness,     Vision   Vision Assessment?: No apparent visual deficits   Perception     Praxis      Cognition Arousal/Alertness: Awake/alert Behavior During Therapy: WFL for tasks assessed/performed Overall Cognitive Status: Within Functional Limits for tasks assessed                                          Exercises     Shoulder Instructions       General Comments Pt requiring 8-10L O2 with questionable wave forms at times. HR increased to 80 BPM with exertion.    Pertinent Vitals/ Pain       Pain Assessment: No/denies pain  Home Living  Prior Functioning/Environment              Frequency  Min 2X/week        Progress Toward Goals  OT Goals(current goals can now be found in the care plan section)  Progress towards OT goals: Progressing toward goals  Acute Rehab OT Goals Patient Stated Goal: to go home  OT Goal Formulation: With patient Time For Goal Achievement: 07/05/20 Potential to Achieve Goals: Good ADL Goals Pt Will Perform Grooming:  with supervision;standing Pt Will Perform Upper Body Bathing: with supervision;sitting Pt Will Perform Lower Body Bathing: with supervision;sit to/from stand Pt Will Perform Upper Body Dressing: with supervision;sitting Pt Will Perform Lower Body Dressing: with supervision;sit to/from stand Pt Will Transfer to Toilet: with supervision;ambulating;regular height toilet Pt Will Perform Toileting - Clothing Manipulation and hygiene: with supervision  Plan Discharge plan remains appropriate    Co-evaluation                 AM-PAC OT "6 Clicks" Daily Activity     Outcome Measure   Help from another person eating meals?: None Help from another person taking care of personal grooming?: A Little Help from another person toileting, which includes using toliet, bedpan, or urinal?: A Little Help from another person bathing (including washing, rinsing, drying)?: A Little Help from another person to put on and taking off regular upper body clothing?: A Little Help from another person to put on and taking off regular lower body clothing?: A Little 6 Click Score: 19    End of Session Equipment Utilized During Treatment: Gait belt;Rolling walker;Oxygen  OT Visit Diagnosis: Unsteadiness on feet (R26.81)   Activity Tolerance Patient tolerated treatment well   Patient Left in bed;with call bell/phone within reach;with bed alarm set   Nurse Communication Mobility status        Time: 1045-1130 OT Time Calculation (min): 45 min  Charges: OT General Charges $OT Visit: 1 Visit OT Treatments $Self Care/Home Management : 23-37 mins $Therapeutic Activity: 8-22 mins  Flora Lipps, OTR/L Acute Rehabilitation Services Pager: [phone] Office: [phone]    Laisha Rau C 07/01/2020, 3:19 PM

## 2020-07-01 NOTE — Progress Notes (Signed)
Patient ID: [name], female   DOB: [birth year], 86 y.o.   MRN: [id number]  PROGRESS NOTE    [name]  PXT:[id number] DOB: [birth year] DOA: 2020/07/14 PCP: Kermit Balo, DO   Brief Narrative:   86 y.o. femalewith medical history significant forparoxysmal atrial fibrillation on Eliquis, hypertension, hypothyroidism, GERD, and anxiety. Patient presented secondary to dyspnea with concern for heart failure exacerbation. Lasix started.  Lasix was subsequently discontinued due to worsening hyponatremia.  She was also started on Augmentin.   Assessment & Plan:   Acute hypoxic respiratory failure -Currently requiring 10 L oxygen via high flow nasal cannula.  Wean off as able.  Antibiotics and diuretics plan as below  Possible community-acquired pneumonia/aspiration pneumonia -CT chest showed multifocal opacities.  Currently on the Augmentin.  Switch to IV Unasyn. -SLP evaluation  Acute on chronic diastolic heart failure -Last echo from 5/121 showed grade 1 diastolic dysfunction.  Initially diuresed with Lasix but subsequently discontinued because of worsening hyponatremia. -Strict input and output.  Daily weights.  Fluid restriction. -will resume IV Lasix.  Cardiology evaluation.  Paroxysmal A. Fib -Continue Eliquis and metoprolol.  Follow cardiology recommendations.  Hyponatremia -Possibly from volume overload.  Patient on hydrochlorothiazide and sertraline as an outpatient. -Sodium 123 today.  Will resume Lasix.  Hypothyroidism -Continue Synthroid  Hypertension -Continue irbesartan and amlodipine.  Resume Lasix.  Hydrochlorothiazide on hold.  Blood pressure stable.  GERD -Continue Protonix  Anxiety -Continue Zoloft along with Xanax as needed  Generalized deconditioning -will need home health PT    DVT prophylaxis: Eliquis Code Status: DNR Family Communication: None at bedside Disposition Plan: Status is: Inpatient  Remains inpatient appropriate  because:Inpatient level of care appropriate due to severity of illness   Dispo: The patient is from: ILF               Anticipated d/c is to: ILF with [facility]              Anticipated d/c date is: > 3 days              Patient currently is not medically stable to d/c.  Consultants: Consult cardiology  Procedures: None  Antimicrobials:  Anti-infectives (From admission, onward)   Start     Dose/Rate Route Frequency Ordered Stop   06/30/20 1145  amoxicillin-clavulanate (AUGMENTIN) 875-125 MG per tablet 1 tablet        1 tablet Oral Every 12 hours 06/30/20 1055         Subjective: Patient seen and examined at bedside.  Does not feel well.  Still feels short of breath with even minimal exertion with some cough.  No overnight fever or vomiting reported.  Objective: Vitals:   07/01/20 0000 07/01/20 0419 07/01/20 0600 07/01/20 0724  BP: 138/64 130/69  138/73  Pulse: 72 74  75  Resp: 20 16  20   Temp: 97.8 F (36.6 C) 97.9 F (36.6 C)  97.8 F (36.6 C)  TempSrc: Oral Oral  Oral  SpO2: 97% 100%  95%  Weight:   56.6 kg   Height:        Intake/Output Summary (Last 24 hours) at 07/01/2020 1127 Last data filed at 07/01/2020 0500 Gross per 24 hour  Intake 443 ml  Output 650 ml  Net -207 ml   Filed Weights   06/29/20 0420 06/30/20 0423 07/01/20 0600  Weight: 65.8 kg 56.7 kg 56.6 kg    Examination:  General exam: Looks chronically ill.  Currently on 10 L  oxygen via high flow nasal cannula. Respiratory system: Bilateral decreased breath sounds at bases with scattered crackles Cardiovascular system: S1 & S2 heard, Rate controlled Gastrointestinal system: Abdomen is nondistended, soft and nontender. Normal bowel sounds heard. Extremities: No cyanosis, clubbing; lower extremity edema present  Central nervous system: Alert and oriented. No focal neurological deficits. Moving extremities Skin: No rashes, lesions or ulcers Psychiatry: Flat affect.  Poor historian.    Data Reviewed:  I have personally reviewed following labs and imaging studies  CBC: Recent Labs  Lab 07/14/2020 2232 06/23/2020 2241 07/10/2020 2246 06/28/20 0521 07/01/20 0407  WBC 9.4  --   --  12.2* 11.0*  NEUTROABS 8.1*  --   --   --   --   HGB 13.6 15.0 14.6 13.7 12.9  HCT 41.3 44.0 43.0 41.5 39.0  MCV 88.8  --   --  89.1 88.8  PLT 168  --   --  152 134*   Basic Metabolic Panel: Recent Labs  Lab 06/28/20 0521 06/28/20 1502 06/29/20 0446 06/29/20 1555 06/30/20 0300 06/30/20 1648 07/01/20 0407  NA 125*   < > 121* 123* 124* 123* 123*  K 3.9   < > 3.8 4.2 4.8 5.0 5.2*  CL 81*   < > 78* 77* 80* 79* 80*  CO2 33*   < > 36* 36* 36* 35* 35*  GLUCOSE 154*   < > 106* 157* 104* 168* 99  BUN 14   < > 15 16 14 13 13   CREATININE 0.60   < > 0.50 0.53 0.55 0.51 0.48  CALCIUM 9.2   < > 8.8* 9.4 9.3 9.2 9.2  MG 2.6*  --   --   --   --   --   --    < > = values in this interval not displayed.   GFR: Estimated Creatinine Clearance: 44.3 mL/min (by C-G formula based on SCr of 0.48 mg/dL). Liver Function Tests: Recent Labs  Lab 07/06/2020 2232  AST 24  ALT 24  ALKPHOS 115  BILITOT 0.6  PROT 7.3  ALBUMIN 4.1   No results for input(s): LIPASE, AMYLASE in the last 168 hours. No results for input(s): AMMONIA in the last 168 hours. Coagulation Profile: No results for input(s): INR, PROTIME in the last 168 hours. Cardiac Enzymes: No results for input(s): CKTOTAL, CKMB, CKMBINDEX, TROPONINI in the last 168 hours. BNP (last 3 results) No results for input(s): PROBNP in the last 8760 hours. HbA1C: No results for input(s): HGBA1C in the last 72 hours. CBG: Recent Labs  Lab 06/29/20 0811 06/29/20 1208  GLUCAP 114* 138*   Lipid Profile: No results for input(s): CHOL, HDL, LDLCALC, TRIG, CHOLHDL, LDLDIRECT in the last 72 hours. Thyroid Function Tests: No results for input(s): TSH, T4TOTAL, FREET4, T3FREE, THYROIDAB in the last 72 hours. Anemia Panel: No results for input(s): VITAMINB12, FOLATE,  FERRITIN, TIBC, IRON, RETICCTPCT in the last 72 hours. Sepsis Labs: No results for input(s): PROCALCITON, LATICACIDVEN in the last 168 hours.  Recent Results (from the past 240 hour(s))  Respiratory Panel by RT PCR (Flu A&B, Covid) - Nasopharyngeal Swab     Status: None   Collection Time: 06/18/2020 11:36 PM   Specimen: Nasopharyngeal Swab  Result Value Ref Range Status   SARS Coronavirus 2 by RT PCR NEGATIVE NEGATIVE Final    Comment: (NOTE) SARS-CoV-2 target nucleic acids are NOT DETECTED.  The SARS-CoV-2 RNA is generally detectable in upper respiratoy specimens during the acute phase of infection. The lowest  concentration of SARS-CoV-2 viral copies this assay can detect is 131 copies/mL. A negative result does not preclude SARS-Cov-2 infection and should not be used as the sole basis for treatment or other patient management decisions. A negative result may occur with  improper specimen collection/handling, submission of specimen other than nasopharyngeal swab, presence of viral mutation(s) within the areas targeted by this assay, and inadequate number of viral copies (<131 copies/mL). A negative result must be combined with clinical observations, patient history, and epidemiological information. The expected result is Negative.  Fact Sheet for Patients:  https://www.moore.com/  Fact Sheet for Healthcare Providers:  https://www.young.biz/  This test is no t yet approved or cleared by the [place] FDA and  has been authorized for detection and/or diagnosis of SARS-CoV-2 by FDA under an Emergency Use Authorization (EUA). This EUA will remain  in effect (meaning this test can be used) for the duration of the COVID-19 declaration under Section 564(b)(1) of the Act, 21 U.S.C. section 360bbb-3(b)(1), unless the authorization is terminated or revoked sooner.     Influenza A by PCR NEGATIVE NEGATIVE Final   Influenza B by PCR NEGATIVE  NEGATIVE Final    Comment: (NOTE) The Xpert Xpress SARS-CoV-2/FLU/RSV assay is intended as an aid in  the diagnosis of influenza from Nasopharyngeal swab specimens and  should not be used as a sole basis for treatment. Nasal washings and  aspirates are unacceptable for Xpert Xpress SARS-CoV-2/FLU/RSV  testing.  Fact Sheet for Patients: https://www.moore.com/  Fact Sheet for Healthcare Providers: https://www.young.biz/  This test is not yet approved or cleared by the [place] FDA and  has been authorized for detection and/or diagnosis of SARS-CoV-2 by  FDA under an Emergency Use Authorization (EUA). This EUA will remain  in effect (meaning this test can be used) for the duration of the  Covid-19 declaration under Section 564(b)(1) of the Act, 21  U.S.C. section 360bbb-3(b)(1), unless the authorization is  terminated or revoked. Performed at [facility], [address] [place], [place] [address]   Respiratory Panel by PCR     Status: None   Collection Time: 06/29/20  3:29 PM   Specimen: Nasopharyngeal Swab; Respiratory  Result Value Ref Range Status   Adenovirus NOT DETECTED NOT DETECTED Final   Coronavirus 229E NOT DETECTED NOT DETECTED Final    Comment: (NOTE) The Coronavirus on the Respiratory Panel, DOES NOT test for the novel  Coronavirus (2019 nCoV)    Coronavirus HKU1 NOT DETECTED NOT DETECTED Final   Coronavirus NL63 NOT DETECTED NOT DETECTED Final   Coronavirus OC43 NOT DETECTED NOT DETECTED Final   Metapneumovirus NOT DETECTED NOT DETECTED Final   Rhinovirus / Enterovirus NOT DETECTED NOT DETECTED Final   Influenza A NOT DETECTED NOT DETECTED Final   Influenza B NOT DETECTED NOT DETECTED Final   Parainfluenza Virus 1 NOT DETECTED NOT DETECTED Final   Parainfluenza Virus 2 NOT DETECTED NOT DETECTED Final   Parainfluenza Virus 3 NOT DETECTED NOT DETECTED Final   Parainfluenza Virus 4 NOT DETECTED NOT DETECTED Final     Respiratory Syncytial Virus NOT DETECTED NOT DETECTED Final   Bordetella pertussis NOT DETECTED NOT DETECTED Final   Chlamydophila pneumoniae NOT DETECTED NOT DETECTED Final   Mycoplasma pneumoniae NOT DETECTED NOT DETECTED Final    Comment: Performed at [facility], [address] [place], [place] [address]         Radiology Studies: CT CHEST WO CONTRAST  Result Date: 06/29/2020 CLINICAL DATA:  Abnormal radiograph. Pleural effusion. Pneumonia. Possible aspiration clinically. EXAM: CT CHEST WITHOUT CONTRAST TECHNIQUE: Multidetector CT imaging of the chest was performed following the standard protocol without IV contrast. COMPARISON:  Plain films including most recent of earlier today. CTA chest 08/02/2016 FINDINGS: Cardiovascular: Aortic atherosclerosis. Moderate to marked cardiomegaly with biatrial enlargement. Multivessel coronary artery atherosclerosis. Mediastinum/Nodes: 1.1 cm precarinal node on 60/3. Hilar regions poorly evaluated without intravenous contrast. Lungs/Pleura: Small bilateral pleural effusions. Fluid or debris within the right sided endobronchial tree, including the bronchus intermedius and more peripheral branches, including on 72/4. Biapical pleuroparenchymal scarring. Minimal motion degradation inferiorly. Bilateral lower lobe and left upper lobe areas of airspace, ground-glass, and scattered "tree-in-bud" nodularity. In the right upper lobe, subpleural areas of mild airspace and ground-glass medially. Somewhat more confluent consolidation in the dependent right lower lobe and right infrahilar central right lower lobe. Upper Abdomen: Normal imaged portions of the liver, spleen, kidneys, right adrenal gland. Musculoskeletal: Accentuation of expected thoracic kyphosis. Convex left thoracic spine curvature is mild. IMPRESSION: 1. Multifocal pulmonary opacities, infection and/or aspiration. 2. Fluid in the right-sided endobronchial tree, for which aspiration should be  excluded clinically. 3. Small bilateral pleural effusions. 4. Mild mediastinal adenopathy, favored to be reactive. 5. Minimal motion degradation inferiorly. 6. Aortic Atherosclerosis (ICD10-I70.0). Coronary artery atherosclerosis. Electronically Signed   By: Jeronimo GreavesKyle  Talbot M.D.   On: 06/29/2020 13:58   DG CHEST PORT 1 VIEW  Result Date: 07/01/2020 CLINICAL DATA:  Shortness of breath EXAM: PORTABLE CHEST 1 VIEW COMPARISON:  June 29, 2020 chest radiograph and chest CT FINDINGS: There are pleural effusions bilaterally with foci of airspace opacity in the left mid lung and both lower lung regions, similar to recent prior studies. There is also ill-defined opacity more superiorly in the left upper lobe. No new opacity is appreciable. Heart is upper normal in size with pulmonary vascularity within normal limits. No adenopathy. There is aortic atherosclerosis. No bone lesions. IMPRESSION: Pleural effusions bilaterally with multifocal pneumonia, more on the left than on the right, stable. Stable cardiac prominence. No adenopathy appreciable. Aortic Atherosclerosis (ICD10-I70.0). Electronically Signed   By: Bretta BangWilliam  Woodruff III M.D.   On: 07/01/2020 08:27        Scheduled Meds:  amLODipine  10 mg Oral QPM   amoxicillin-clavulanate  1 tablet Oral Q12H   apixaban  2.5 mg Oral BID   irbesartan  150 mg Oral Daily   levothyroxine  100 mcg Oral Q0600   melatonin  3 mg Oral QHS   metoprolol succinate  100 mg Oral Daily   pantoprazole  40 mg Oral Daily   potassium chloride  20 mEq Oral Daily   sertraline  50 mg Oral q AM   sodium chloride flush  3 mL Intravenous Q12H   temazepam  30 mg Oral QHS   Continuous Infusions:        Glade LloydKshitiz Mclain Freer, MD [facility] 07/01/2020, 11:27 AM

## 2020-07-01 NOTE — Consult Note (Signed)
CARDIOLOGY CONSULT NOTE  Patient ID: [name] MRN: [id number] DOB/AGE: [birth year] 86 y.o.  Admit date: 06/21/2020 Attending physician: Glade Lloyd, MD Primary Physician:  Kermit Balo, DO Outpatient Cardiologist: Tessa Lerner, DO, [facility] Inpatient Cardiologist: Tessa Lerner, DO, [facility]  Chief complaint: Shortness of breath Reason of consultation acute congestive heart failure  HPI:  [name] is a 86 y.o. Caucasian female who presents with a chief complaint of " shortness of breath." Her past medical history and cardiovascular risk factors include: paroxysmal atrial fibrillation on Eliquis, hypertension, hypothyroidism, GERD, and anxiety.  Patient was last seen in the office on 05/29/2020.  At that time she was doing well from a cardiovascular standpoint.  Patient lives in  the independent portion at [place].  Prior to the hospitalization patient states that she was noting effort related dyspnea when walking to and from the dining area.  She was attributing the dyspnea on exertion to possibly being in atrial fibrillation.  She also noted more lower extremity swelling than usual.  She denies orthopnea or paroxysmal nocturnal dyspnea.  She denies any active chest pain at rest or with effort related activities.  Patient states prior to the hospitalization she was about to swallow her Centrum Silver vitamin and Tylenol but started to choke on her pills and began to have a nonproductive cough s. since her shortness of breath increased EMS was called and patient was taken to the hospital for further evaluation and management.  Cardiology has been consulted today after the patient has been hospitalized for approximately 3 days for heart failure management.  Currently patient is euvolemic and states that the shortness of breath has improved significantly.  She still has this cough but is unable to bring up anything.   At the time of the hospitalization she was noted to have hyponatremia  with a sodium of 124, serum creatinine 0.58, high sensitive troponin 4, and BNP 456.  Chest x-ray concerning for bilateral pleural effusion right greater than left with associated compressive atelectasis.  ALLERGIES: Allergies  Allergen Reactions  . Crab (Diagnostic) Shortness Of Breath and Other (See Comments)    Patient "swells"  . Shellfish-Derived Products Shortness Of Breath, Swelling and Other (See Comments)    Patient can tolerate shrimp- body swells with other shellfish  . Ambien [Zolpidem Tartrate] Other (See Comments)    Hallucinations   . Bactrim [Sulfamethoxazole-Trimethoprim] Other (See Comments)    Unknown reaction   . Morphine And Related Nausea And Vomiting  . Zithromax [Azithromycin] Other (See Comments)    Not effective      PAST MEDICAL HISTORY: Past Medical History:  Diagnosis Date  . Allergic rhinitis due to pollen   . Anxiety state, unspecified   . Calculus of kidney   . Cataract   . Diaphragmatic hernia without mention of obstruction or gangrene   . Diffuse cystic mastopathy   . Dizziness and giddiness   . GERD (gastroesophageal reflux disease)   . Hiatal hernia   . Insomnia, unspecified   . Insomnia, unspecified   . Lumbago   . Macular degeneration (senile) of retina, unspecified   . Open wound of toe(s), without mention of complication   . Osteoarthrosis, unspecified whether generalized or localized, unspecified site   . Other and unspecified hyperlipidemia   . PAF (paroxysmal atrial fibrillation) (HCC)   . Personal history of fall   . Sciatica   . Sebaceous cyst   . Senile osteoporosis   . Unspecified constipation   . Unspecified  essential hypertension   . Unspecified hereditary and idiopathic peripheral neuropathy   . Unspecified hypothyroidism   . Unspecified tinnitus     PAST SURGICAL HISTORY: Past Surgical History:  Procedure Laterality Date  . ABDOMINAL HYSTERECTOMY    . CATARACT EXTRACTION, BILATERAL    . CHOLECYSTECTOMY   07/25/2007  . TONSILLECTOMY AND ADENOIDECTOMY Bilateral 1943    FAMILY HISTORY: The patient family history includes Fibromyalgia in her daughter; Hyperlipidemia in her brother; Hypertension in her brother, brother, and son.   SOCIAL HISTORY:  The patient  reports that she has never smoked. She has never used smokeless tobacco. She reports that she does not drink alcohol and does not use drugs.  MEDICATIONS: Current Outpatient Medications  Medication Instructions  . acetaminophen (TYLENOL) 500 mg, Oral, See admin instructions, Take 500 mg by mouth in the morning and in the evening  . ALPRAZolam (XANAX) 0.5 MG tablet TAKE 1 TABLET BY MOUTH THREE TIMES A DAY AS NEEDED FOR ANXIETY  . amLODipine (NORVASC) 10 mg, Oral, Every evening  . ELIQUIS 2.5 MG TABS tablet TAKE 1 TABLET BY MOUTH TWICE A DAY  . Ensure (ENSURE) 237 mLs, Oral, Every morning  . hydrochlorothiazide (MICROZIDE) 12.5 mg, Oral, Every morning  . irbesartan (AVAPRO) 150 mg, Oral, Daily  . KLOR-CON M20 20 MEQ tablet TAKE 1 TABLET BY MOUTH EVERY DAY  . levothyroxine (SYNTHROID) 100 MCG tablet TAKE 1 TABLET BY MOUTH EVERY DAY BEFORE BREAKFAST  . loratadine (CLARITIN) 10 mg, Oral, Daily  . melatonin 3 mg, Oral, Daily at bedtime  . metoprolol succinate (TOPROL XL) 100 mg, Oral, Daily, Hold if systolic blood pressure (top blood pressure number) less than 100 mmHg or heart rate less than 60 bpm (pulse).  . Multiple Vitamins-Minerals (CENTRUM SILVER 50+WOMEN) TABS 1 tablet, Oral, Daily with breakfast  . pantoprazole (PROTONIX) 40 MG tablet TAKE 1 TABLET BY MOUTH EVERY DAY  . polyethylene glycol (MIRALAX / GLYCOLAX) 17 g, Oral, See admin instructions, Mix 17 grams into a sufficient amount of water/liquid and drink every two to three days  . Propylene Glycol (SYSTANE BALANCE) 0.6 % SOLN 1 drop, Both Eyes, 2 times daily  . sertraline (ZOLOFT) 50 MG tablet Take one tablet by mouth once daily to calm nerves.  . temazepam (RESTORIL) 30 MG  capsule TAKE 1 CAPSULE BY MOUTH AT BEDTIME AS NEEDED FOR SLEEP  . Trolamine Salicylate (ASPERCREME EX) 1 application, Apply externally, See admin instructions, Apply to both legs up to 4 times a day    Review of Systems  Constitutional: Negative for chills and fever.  HENT: Positive for congestion. Negative for hoarse voice and nosebleeds.   Eyes: Negative for discharge, double vision and pain.  Cardiovascular: Positive for irregular heartbeat. Negative for chest pain, claudication, leg swelling, near-syncope, orthopnea, palpitations, paroxysmal nocturnal dyspnea and syncope.  Respiratory: Positive for cough and shortness of breath. Negative for hemoptysis.   Musculoskeletal: Negative for muscle cramps and myalgias.  Gastrointestinal: Negative for abdominal pain, constipation, diarrhea, hematemesis, hematochezia, melena, nausea and vomiting.  Neurological: Negative for dizziness and light-headedness.  All other systems reviewed and are negative.  PHYSICAL EXAM: Vitals with BMI 07/01/2020 07/01/2020 07/01/2020  Height - - -  Weight - 124 lbs 11 oz -  BMI - 18.97 -  Systolic 138 - 130  Diastolic 73 - 69  Pulse 75 - 74    Intake/Output Summary (Last 24 hours) at 07/01/2020 0902 Last data filed at 07/01/2020 0500 Gross per 24 hour  Intake 446  ml  Output 650 ml  Net -204 ml    Net IO Since Admission: -1,614 mL [07/01/20 0902]  CONSTITUTIONAL: Age-appropriate female, hemodynamically stable, no acute distress.   SKIN: Skin is warm and dry. No rash noted. No cyanosis. No pallor. No jaundice HEAD: Normocephalic and atraumatic.  EYES: No scleral icterus MOUTH/THROAT: Moist oral membranes.  NECK: No JVD present. No thyromegaly noted. No carotid bruits  LYMPHATIC: No visible cervical adenopathy.  CHEST Normal respiratory effort. No intercostal retractions  LUNGS: Decreased breath sounds at the bases, rhonchi is noted bilaterally right worse than the left, expiratory wheezes, no  rales. CARDIOVASCULAR: Irregularly irregular, positive S1-S2, soft systolic murmur heard at the left sternal border, no gallops or rubs. ABDOMINAL: Soft, nontender, nondistended, positive bowel sounds in all 4 quadrants.  No apparent ascites.  EXTREMITIES: Trivial bilateral peripheral edema, warm to touch bilaterally. HEMATOLOGIC: No significant bruising NEUROLOGIC: Oriented to person, place, and time. Nonfocal. Normal muscle tone.  PSYCHIATRIC: Normal mood and affect. Normal behavior. Cooperative  RADIOLOGY: CT CHEST WO CONTRAST  Result Date: 06/29/2020 CLINICAL DATA:  Abnormal radiograph. Pleural effusion. Pneumonia. Possible aspiration clinically. EXAM: CT CHEST WITHOUT CONTRAST TECHNIQUE: Multidetector CT imaging of the chest was performed following the standard protocol without IV contrast. COMPARISON:  Plain films including most recent of earlier today. CTA chest 08/02/2016 FINDINGS: Cardiovascular: Aortic atherosclerosis. Moderate to marked cardiomegaly with biatrial enlargement. Multivessel coronary artery atherosclerosis. Mediastinum/Nodes: 1.1 cm precarinal node on 60/3. Hilar regions poorly evaluated without intravenous contrast. Lungs/Pleura: Small bilateral pleural effusions. Fluid or debris within the right sided endobronchial tree, including the bronchus intermedius and more peripheral branches, including on 72/4. Biapical pleuroparenchymal scarring. Minimal motion degradation inferiorly. Bilateral lower lobe and left upper lobe areas of airspace, ground-glass, and scattered "tree-in-bud" nodularity. In the right upper lobe, subpleural areas of mild airspace and ground-glass medially. Somewhat more confluent consolidation in the dependent right lower lobe and right infrahilar central right lower lobe. Upper Abdomen: Normal imaged portions of the liver, spleen, kidneys, right adrenal gland. Musculoskeletal: Accentuation of expected thoracic kyphosis. Convex left thoracic spine curvature is  mild. IMPRESSION: 1. Multifocal pulmonary opacities, infection and/or aspiration. 2. Fluid in the right-sided endobronchial tree, for which aspiration should be excluded clinically. 3. Small bilateral pleural effusions. 4. Mild mediastinal adenopathy, favored to be reactive. 5. Minimal motion degradation inferiorly. 6. Aortic Atherosclerosis (ICD10-I70.0). Coronary artery atherosclerosis. Electronically Signed   By: Jeronimo GreavesKyle  Talbot M.D.   On: 06/29/2020 13:58   DG CHEST PORT 1 VIEW  Result Date: 07/01/2020 CLINICAL DATA:  Shortness of breath EXAM: PORTABLE CHEST 1 VIEW COMPARISON:  June 29, 2020 chest radiograph and chest CT FINDINGS: There are pleural effusions bilaterally with foci of airspace opacity in the left mid lung and both lower lung regions, similar to recent prior studies. There is also ill-defined opacity more superiorly in the left upper lobe. No new opacity is appreciable. Heart is upper normal in size with pulmonary vascularity within normal limits. No adenopathy. There is aortic atherosclerosis. No bone lesions. IMPRESSION: Pleural effusions bilaterally with multifocal pneumonia, more on the left than on the right, stable. Stable cardiac prominence. No adenopathy appreciable. Aortic Atherosclerosis (ICD10-I70.0). Electronically Signed   By: Bretta BangWilliam  Woodruff III M.D.   On: 07/01/2020 08:27    LABORATORY DATA: Lab Results  Component Value Date   WBC 11.0 (H) 07/01/2020   HGB 12.9 07/01/2020   HCT 39.0 07/01/2020   MCV 88.8 07/01/2020   PLT 134 (L) 07/01/2020    Recent  Labs  Lab 07-08-20 2232 07-08-2020 2241 07/01/20 0407  NA 124*   < > 123*  K 3.8   < > 5.2*  CL 82*   < > 80*  CO2 31   < > 35*  BUN 14   < > 13  CREATININE 0.58   < > 0.48  CALCIUM 9.3   < > 9.2  PROT 7.3  --   --   BILITOT 0.6  --   --   ALKPHOS 115  --   --   ALT 24  --   --   AST 24  --   --   GLUCOSE 159*   < > 99   < > = values in this interval not displayed.    Lipid Panel  Lab Results   Component Value Date   CHOL 175 03/05/2018   HDL 59 03/05/2018   LDLCALC 92 03/05/2018   TRIG 147 03/05/2018   CHOLHDL 3.0 03/05/2018   BNP (last 3 results) Recent Labs    11/17/19 1224 July 08, 2020 2232  BNP 307.3* 456.1*    HEMOGLOBIN A1C Lab Results  Component Value Date   HGBA1C  07/23/2007    5.7 (NOTE)   The ADA recommends the following therapeutic goals for glycemic   control related to Hgb A1C measurement:   Goal of Therapy:   < 7.0% Hgb A1C   Action Suggested:  > 8.0% Hgb A1C   Ref:  Diabetes Care, 22, Suppl. 1, 1999   MPG 126 07/23/2007    Cardiac Panel (last 3 results) No results for input(s): CKTOTAL, CKMB, RELINDX in the last 8760 hours.  Invalid input(s): TROPONINHS  No results found for: CKTOTAL, CKMB, CKMBINDEX   TSH Recent Labs    11/17/19 1224 06/28/20 0500  TSH 3.002 1.713    RADIOLOGY: CT chest without contrast 06/29/2020: IMPRESSION: 1. Multifocal pulmonary opacities, infection and/or aspiration. 2. Fluid in the right-sided endobronchial tree, for which aspirationshould be excluded clinically. 3. Small bilateral pleural effusions. 4. Mild mediastinal adenopathy, favored to be reactive. 5. Minimal motion degradation inferiorly. 6. Aortic Atherosclerosis (ICD10-I70.0). Coronary artery atherosclerosis.   CARDIAC DATABASE: EKG: 12/09/2019: Normal sinus rhythm, 70 bpm, normal axis, poor R wave progression, LVH per voltage criteria, cannot rule out old anterior infarct, without underlying injury pattern.  07/08/2020: Atrial fibrillation, 84 bpm, normal axis, poor R wave progression, rare premature ventricular contractions, consider old anterior infarct, nonspecific ST-T changes either due to inferolateral ischemia or ST-T changes due to LVH. Compared to prior EKG normal sinus rhythm is transition to atrial fibrillation.  Echocardiogram: 12/20/2019:  Normal LV systolic function with visual EF 55-60%. Left ventricle cavity is normal in size. Mild  concentric hypertrophy of the left ventricle.  Normal global wall motion. Doppler evidence of grade II (pseudonormal) diastolic dysfunction, elevated LAP. Calculated EF 50%.  Patient was in sinus rhythm during exam.  Left atrial cavity is severely dilated with LA voume index of 70 mL/m2.  Structurally normal mitral valve. Mild (Grade I) mitral regurgitation.  Structurally normal tricuspid valve. Mild tricuspid regurgitation. No evidence of pulmonary hypertension. RVSP measures 30 mmHg.  06/28/2020: LVEF 60-65%, no regional wall motion abnormalities, diastolic dysfunction could not be evaluated, moderate pulmonary hypertension with RVSP 51 mmHg, severely dilated biatrial enlargement, mild to moderate MR, mild to moderate TR, aortic valve sclerosis without stenosis.  Stress Testing: 12/30/2019: Small sized, mild intensity, partially reversible perfusion defect in basal inferoseptal myocardium. Normal myocardial perfusion. All segments of left ventricle demonstrated normal  wall motion and thickening. Stress LVEF calculated 42%, although visually appears normal.  Heart Catheterization: None  IMPRESSION & RECOMMENDATIONS: [name] is a 86 y.o. Caucasian female whose past medical history and cardiovascular risk factors include: paroxysmal atrial fibrillation on Eliquis, hypertension, hypothyroidism, GERD, and anxiety.  Shortness of breath: Multifactorial (differential diagnosis includes atrial fibrillation, aspiration pneumonia secondary to choking, cannot entirely rule out diastolic heart failure)  Current acute hypoxic respiratory failure is most likely secondary to her transitioning to atrial fibrillation and underlying pneumonia.  Will defer management of pneumonia to primary team.    In regards to atrial fibrillation we will increase the beta-blockers to Toprol-XL 150 mg p.o. daily.  She has been anticoagulated without interruptions for last 4 weeks.  Once her pneumonia has  improved/resolved and if the patient remains to be in atrial fibrillation may consider cardioversion at that time until than would continue medical management.    Cardiology was consulted for acute heart failure management.  She does have risk factors for diastolic heart failure however, I didnot evaluate her on admission.  And clinically today she is euvolemic, denies orthopnea, paroxysmal nocturnal dyspnea, or lower extremity swelling.  No JVP on physical examination.  And her BNP on admission was elevated.  Patient was diuresed during his hospitalization but currently held due to hyponatremia. She had an echocardiogram during this hospitalization which notes preserved LVEF without regional wall motion normalities and valvular heart disease.  Would recommend medical management without any additional cardiovascular testing at this time.  Paroxysmal atrial fibrillation:  Currently in atrial fibrillation but ventricular rate is well controlled.  We will increase beta-blocker therapy.    Continue Toprol-XL 100 mg p.o. every morning.  We will add Toprol-XL 50 mg p.o. every afternoon.  Continue anticoagulation for thromboembolic prophylaxis.  Continue telemetry.  May consider DCCV prior to d/c if still in atrial fibrillation and symptomatic.  Pneumonia: Community-acquired/aspiration Management per primary team  Hyponatremia: Currently managed by primary team.  Plan of care discussed with both the patient' daughter Vanetta Shawl english [phone]) and attending physician.  We will follow the patient with you for peripherally.  Total encounter time 85 minutes. *Total Encounter Time as defined by the Centers for Medicare and Medicaid Services includes, in addition to the face-to-face time of a patient visit (documented in the note above) non-face-to-face time: obtaining and reviewing outside history, ordering and reviewing medications, tests or procedures, care coordination (communications with other  health care professionals or caregivers) and documentation in the medical record.  Patient's questions and concerns were addressed to her satisfaction. She voices understanding of the instructions provided during this encounter.   This note was created using a voice recognition software as a result there may be grammatical errors inadvertently enclosed that do not reflect the nature of this encounter. Every attempt is made to correct such errors.  Tessa Lerner, [place], [facility]  Pager: [phone] Office: [phone] 07/01/2020, 9:02 AM

## 2020-07-01 NOTE — Progress Notes (Signed)
Pharmacy Antibiotic Note  [name] is a 86 y.o. female admitted on 07/02/2020 with SOB, Chest CT shows multifocal opacities - concern for aspiration pneumonia.  Augmentin started 11/16 but concern for swallowing will change to IV abx.  Pharmacy has been consulted for Unasyn dosing. Cr 0.4, wt 56kg crcl apporx 61ml/min wbc 11 afebrile  Plan: Unasyn 3gm IV q8h  Height: 5\' 8"  (172.7 cm) Weight: 56.6 kg (124 lb 11.2 oz) IBW/kg (Calculated) : 63.9  Temp (24hrs), Avg:97.8 F (36.6 C), Min:97 F (36.1 C), Max:98.5 F (36.9 C)  Recent Labs  Lab 07/10/2020 2232 07/01/2020 2241 06/28/20 0521 06/28/20 1502 06/29/20 0446 06/29/20 1555 06/30/20 0300 06/30/20 1648 07/01/20 0407  WBC 9.4  --  12.2*  --   --   --   --   --  11.0*  CREATININE 0.58   < > 0.60   < > 0.50 0.53 0.55 0.51 0.48   < > = values in this interval not displayed.    Estimated Creatinine Clearance: 44.3 mL/min (by C-G formula based on SCr of 0.48 mg/dL).    Allergies  Allergen Reactions  . Crab (Diagnostic) Shortness Of Breath and Other (See Comments)    Patient "swells"  . Shellfish-Derived Products Shortness Of Breath, Swelling and Other (See Comments)    Patient can tolerate shrimp- body swells with other shellfish  . Ambien [Zolpidem Tartrate] Other (See Comments)    Hallucinations   . Bactrim [Sulfamethoxazole-Trimethoprim] Other (See Comments)    Unknown reaction   . Morphine And Related Nausea And Vomiting  . Zithromax [Azithromycin] Other (See Comments)    Not effective      Antimicrobials this admission:   Dose adjustments this admission:   Microbiology results:   07/03/20 Pharm.D. CPP, BCPS Clinical Pharmacist [phone] 07/01/2020 1:10 PM

## 2020-07-01 NOTE — TOC Progression Note (Signed)
Transition of Care [facility]) - Progression Note    Patient Details  Name: [name] MRN: [id number] Date of Birth: 1931/06/05  Transition of Care [facility]) CM/SW Contact  Graves-Bigelow, Lamar Laundry, RN Phone Number: 07/01/2020, 4:01 PM  Clinical Narrative: Case Manager spoke with patient and she is agreeable to home health RN services for [facility] for CHF Management. Frances Furbish is aware. Patient will need orders for RN/PT/OT and F2F once stable to transition home. Case Manager will continue to follow.   Expected Discharge Plan: Home w Home Health Services Barriers to Discharge: Continued Medical Work up  Expected Discharge Plan and Services Expected Discharge Plan: Home w Home Health Services   Discharge Planning Services: CM Consult Post Acute Care Choice: Home Health Living arrangements for the past 2 months: Apartment                 DME Arranged: N/A DME Agency: NA       HH Arranged: RN, PT, OT HH Agency: [facility] Date [facility] Agency Contacted: 07/01/20 Time HH Agency Contacted: 1601 Representative spoke with at [facility] Agency: Lorenza Chick     Readmission Risk Interventions No flowsheet data found.

## 2020-07-02 ENCOUNTER — Inpatient Hospital Stay (HOSPITAL_COMMUNITY): Payer: Medicare Other

## 2020-07-02 DIAGNOSIS — J690 Pneumonitis due to inhalation of food and vomit: Secondary | ICD-10-CM | POA: Diagnosis not present

## 2020-07-02 DIAGNOSIS — J9601 Acute respiratory failure with hypoxia: Secondary | ICD-10-CM | POA: Diagnosis not present

## 2020-07-02 DIAGNOSIS — I10 Essential (primary) hypertension: Secondary | ICD-10-CM | POA: Diagnosis not present

## 2020-07-02 DIAGNOSIS — I5033 Acute on chronic diastolic (congestive) heart failure: Secondary | ICD-10-CM | POA: Diagnosis not present

## 2020-07-02 LAB — BASIC METABOLIC PANEL
Anion gap: 13 (ref 5–15)
BUN: 13 mg/dL (ref 8–23)
CO2: 38 mmol/L — ABNORMAL HIGH (ref 22–32)
Calcium: 8.9 mg/dL (ref 8.9–10.3)
Chloride: 73 mmol/L — ABNORMAL LOW (ref 98–111)
Creatinine, Ser: 0.46 mg/dL (ref 0.44–1.00)
GFR, Estimated: >60 mL/min (ref >60)
Glucose, Bld: 100 mg/dL — ABNORMAL HIGH (ref 70–99)
Potassium: 3.6 mmol/L (ref 3.5–5.1)
Sodium: 124 mmol/L — ABNORMAL LOW (ref 135–145)

## 2020-07-02 LAB — CBC WITH DIFFERENTIAL/PLATELET
Abs Immature Granulocytes: 0.04 10*3/uL (ref 0.00–0.07)
Basophils Absolute: 0 10*3/uL (ref 0.0–0.1)
Basophils Relative: 0 %
Eosinophils Absolute: 0 10*3/uL (ref 0.0–0.5)
Eosinophils Relative: 0 %
HCT: 41 % (ref 36.0–46.0)
Hemoglobin: 13.7 g/dL (ref 12.0–15.0)
Immature Granulocytes: 0 %
Lymphocytes Relative: 5 %
Lymphs Abs: 0.5 10*3/uL — ABNORMAL LOW (ref 0.7–4.0)
MCH: 29.6 pg (ref 26.0–34.0)
MCHC: 33.4 g/dL (ref 30.0–36.0)
MCV: 88.6 fL (ref 80.0–100.0)
Monocytes Absolute: 1.2 10*3/uL — ABNORMAL HIGH (ref 0.1–1.0)
Monocytes Relative: 11 %
Neutro Abs: 9.5 10*3/uL — ABNORMAL HIGH (ref 1.7–7.7)
Neutrophils Relative %: 84 %
Platelets: 145 10*3/uL — ABNORMAL LOW (ref 150–400)
RBC: 4.63 MIL/uL (ref 3.87–5.11)
RDW: 13.1 % (ref 11.5–15.5)
WBC: 11.3 10*3/uL — ABNORMAL HIGH (ref 4.0–10.5)
nRBC: 0 % (ref 0.0–0.2)

## 2020-07-02 LAB — MAGNESIUM: Magnesium: 1.4 mg/dL — ABNORMAL LOW (ref 1.7–2.4)

## 2020-07-02 MED ORDER — RESOURCE THICKENUP CLEAR PO POWD
ORAL | Status: DC | PRN
  Filled 2020-07-02: qty 125

## 2020-07-02 MED ORDER — SODIUM CHLORIDE 1 G PO TABS
1.0000 g | ORAL_TABLET | Freq: Three times a day (TID) | ORAL | Status: DC
  Administered 2020-07-02 – 2020-07-05 (×12): 1 g via ORAL
  Filled 2020-07-02 (×14): qty 1

## 2020-07-02 MED ORDER — MAGNESIUM SULFATE 2 GM/50ML IV SOLN
2.0000 g | Freq: Once | INTRAVENOUS | Status: AC
  Administered 2020-07-02: 2 g via INTRAVENOUS
  Filled 2020-07-02: qty 50

## 2020-07-02 NOTE — Progress Notes (Signed)
Modified Barium Swallow Progress Note  Patient Details  Name: [name] MRN: [id number] Date of Birth: 26-Apr-1931  Today's Date: 07/02/2020  Modified Barium Swallow completed.  Full report located under Chart Review in the Imaging Section.  Brief recommendations include the following:  Clinical Impression  Pt presented with oropharyngeal dysphagia characterized by impaired bolus cohesion, reduced lingual retraction, and reduced pharyngeal constriction. She demonstrated premature spillage to the valleculae, base of tongue residue, vallecular residue, and posterior pharyngeal wall residue. Incomplete epiglottic inversion was noted secondary to cervical kyphosis which limited range of motion of the epiglottis. Penetration (PAS 3) and aspiration (PAS 7, 8) were noted with thin liquids and nectar thick liquids via cup and straw due to incomplete epiglottic inversion. Moderate posterior pharyngeal wall residue was noted with regular texture solids; aspiration was noted with this consistency during secondary swallows due to residue being pushed into the larynx when epiglottic inversion was attempted. Penetration and aspiration were eliminated with nectar thick liquids when a chin tuck posture was used with a left head turn. However, penetration and aspiration were still inconsistently noted with thin liquids and with larger boluses of solids. Esophageal screening revealed retention of barium in the upper thoracic esophagus; consider dedicated esophageal assessment to determine etiology. A dysphagia 3 diet with nectar thick liquids is recommended at this time with strict observance of swallowing precautions noted below. SLP will follow for dysphagia treatment.    Swallow Evaluation Recommendations   Recommended Consults: Other (Comment)   SLP Diet Recommendations: Dysphagia 3 (Mech soft) solids;Nectar thick liquid   Liquid Administration via: Cup;Straw   Medication Administration: Whole meds with  puree   Supervision: Patient able to self feed;Intermittent supervision to cue for compensatory strategies   Compensations: Slow rate;Small sips/bites;Chin tuck (chin tuck with left head turn)   Postural Changes: Remain semi-upright after after feeds/meals (Comment);Seated upright at 90 degrees   Oral Care Recommendations: Oral care BID       Brendan Gruwell I. Vear Clock, MS, CCC-SLP Acute Rehabilitation Services Office number [phone] Pager [phone]  Scheryl Marten 07/02/2020,3:50 PM

## 2020-07-02 NOTE — Progress Notes (Signed)
Patient  had a run of 9 beats PVC per cardiac monitor. Patient resting when assessed, denies any discomforts. V/s checked, no  undue changes,see v/s flow sheet. Dr Antionette Char notified via text . Continue to monitor patient.

## 2020-07-02 NOTE — Progress Notes (Signed)
Occupational Therapy Treatment Patient Details Name: [name] MRN: [id number] DOB: 07/21/1931 Today's Date: 07/02/2020    History of present illness This 67 admitted with SOB and cough.  Dx:  acute respiratory failrue with hypoxia, acute on chronic diastolic CHF, pleural effusions, hypnatremia, paroxysmal A-Fib.  PMH includes: unspecified tinnitus, hereditary ideopathic peripheral neuropathy, HTN, senile osteoporosis, OA, macular degeneration, diaphragmatic hernia, anxiety    OT comments  Patient found in bed, just returned from her swallowing eval.  Patient stated she was told to turn her head prior to swallowing.  Overall she appears to be reaching her max functional potential in the acute setting given her baseline vision impairments.  She is really just needing assist for her line and leads, and verbal cues for her environment.  OT spent a few minutes assisting patient with the orientation of call bell for TV usage.  OT will continue efforts in the acute setting, but home with [facility] is appropriate.    Follow Up Recommendations  Home health OT;Supervision - Intermittent    Equipment Recommendations  None recommended by OT    Recommendations for Other Services      Precautions / Restrictions Precautions Precautions: Fall Restrictions Other Position/Activity Restrictions: poor vision - macular degeneration at baseline       Mobility Bed Mobility Overal bed mobility: Needs Assistance Bed Mobility: Supine to Sit;Sit to Supine     Supine to sit: Supervision Sit to supine: Min guard   General bed mobility comments: supervision for lines  Transfers Overall transfer level: Needs assistance Equipment used: Rolling walker (2 wheeled) Transfers: Sit to/from [employer] Sit to Stand: Supervision Stand pivot transfers: Min guard       General transfer comment: Min Guard for mobility to bathroom    Balance Overall balance assessment: Needs  assistance Sitting-balance support: Feet supported;No upper extremity supported Sitting balance-[name]: Good     Standing balance support: Bilateral upper extremity supported Standing balance-[name]: Fair                             ADL either performed or assessed with clinical judgement   ADL Overall ADL's : Needs assistance/impaired     Grooming: Min guard;Standing Grooming Details (indicate cue type and reason): standing at sink             Lower Body Dressing: Min guard;Sitting/lateral leans;Sit to/from stand   Toilet Transfer: Min guard;Ambulation;RW;Grab bars           Functional mobility during ADLs: Min guard;Rolling walker General ADL Comments: poor vision and new environment                       Cognition Arousal/Alertness: Awake/alert Behavior During Therapy: WFL for tasks assessed/performed Overall Cognitive Status: Within Functional Limits for tasks assessed                                 General Comments: decreased ST Memory noted.                    General Comments O2 sats remianed above 95% 4 L via [place]    Pertinent Vitals/ Pain       Pain Assessment: No/denies pain  Frequency  Min 2X/week        Progress Toward Goals  OT Goals(current goals can now be found in the care plan section)  Progress towards OT goals: Progressing toward goals  Acute Rehab OT Goals Patient Stated Goal: Hoping to return to her ALF OT Goal Formulation: With patient Time For Goal Achievement: 07/05/20 Potential to Achieve Goals: Good  Plan Discharge plan remains appropriate    Co-evaluation                 AM-PAC OT "6 Clicks" Daily Activity     Outcome Measure   Help from another person eating meals?: None Help from another person taking care of personal grooming?: A Little Help from another person toileting,  which includes using toliet, bedpan, or urinal?: A Little Help from another person bathing (including washing, rinsing, drying)?: A Little Help from another person to put on and taking off regular upper body clothing?: A Little Help from another person to put on and taking off regular lower body clothing?: A Little 6 Click Score: 19    End of Session Equipment Utilized During Treatment: Rolling walker;Oxygen  OT Visit Diagnosis: Unsteadiness on feet (R26.81)   Activity Tolerance Patient tolerated treatment well   Patient Left in bed;with call bell/phone within reach;with bed alarm set   Nurse Communication          Time: 0086-7619 OT Time Calculation (min): 21 min  Charges: OT General Charges $OT Visit: 1 Visit OT Treatments $Self Care/Home Management : 8-22 mins  07/02/2020  Rich, OTR/L  Acute Rehabilitation Services  Office:  [phone]    Suzanna Obey 07/02/2020, 4:32 PM

## 2020-07-02 NOTE — Evaluation (Signed)
Clinical/Bedside Swallow Evaluation Patient Details  Name: [name] MRN: [id number] Date of Birth: December 06, 1930  Today's Date: 07/02/2020 Time: SLP Start Time (ACUTE ONLY): 1000 SLP Stop Time (ACUTE ONLY): 1017 SLP Time Calculation (min) (ACUTE ONLY): 17 min  Past Medical History:  Past Medical History:  Diagnosis Date  . Allergic rhinitis due to pollen   . Anxiety state, unspecified   . Calculus of kidney   . Cataract   . Diaphragmatic hernia without mention of obstruction or gangrene   . Diffuse cystic mastopathy   . Dizziness and giddiness   . GERD (gastroesophageal reflux disease)   . Hiatal hernia   . Insomnia, unspecified   . Insomnia, unspecified   . Lumbago   . Macular degeneration (senile) of retina, unspecified   . Open wound of toe(s), without mention of complication   . Osteoarthrosis, unspecified whether generalized or localized, unspecified site   . Other and unspecified hyperlipidemia   . PAF (paroxysmal atrial fibrillation) (HCC)   . Personal history of fall   . Sciatica   . Sebaceous cyst   . Senile osteoporosis   . Unspecified constipation   . Unspecified essential hypertension   . Unspecified hereditary and idiopathic peripheral neuropathy   . Unspecified hypothyroidism   . Unspecified tinnitus    Past Surgical History:  Past Surgical History:  Procedure Laterality Date  . ABDOMINAL HYSTERECTOMY    . CATARACT EXTRACTION, BILATERAL    . CHOLECYSTECTOMY  07/25/2007  . TONSILLECTOMY AND ADENOIDECTOMY Bilateral 1943   HPI:  Pt is an 86 y.o. female with medical history significant for paroxysmal atrial fibrillation on Eliquis, hypertension, hypothyroidism, GERD, and anxiety who presented to the ED for evaluation of shortness of breath and cough.  CXR revealed interval development of bilateral pleural effusions, right greater than left, with associated compressive atelectasis. On date of admission, pt reported that she was taking her afternoon  medications (Eliquis, Tylenol, and vitamin supplements) when she choked on the pills, began to have nonproductive coughing fits, and increased SOB. Pt reported to referring MD, that she has "frequent issues with choking on her pills, most notably on her potassium supplement which she takes every morning." CT chest 11/15: Multifocal pulmonary opacities, infection and/or aspiration. Fluid in the right-sided endobronchial tree, for which aspiration should be excluded clinically. CXR 11/17: Pleural effusions bilaterally with multifocal pneumonia, more on the left. Bedside swallow evaluation pm 1114 was negative for signs of aspiration.    Assessment / Plan / Recommendation Clinical Impression  Pt was seen for bedside swallow re-evaluation. She reported that she continues to tolerate medications with puree and meals without overt s/sx of aspiration. Oral mechanism exam was [facility] and dentition adequate. Pt remains asymptomatic of oropharyngeal dysphagia at bedside. However, due to concerns regarding aspiration, a modified barium swallow study will be conducted to rule out silent aspiration.  SLP Visit Diagnosis: Dysphagia, unspecified (R13.10)    Aspiration Risk  No limitations    Diet Recommendation Regular;Thin liquid   Liquid Administration via: Straw;Cup Medication Administration: Whole meds with puree (break potassium pill) Supervision: Patient able to self feed Compensations: Slow rate Postural Changes: Seated upright at 90 degrees    Other  Recommendations Oral Care Recommendations: Oral care BID   Follow up Recommendations  (TBD)      Frequency and Duration            Prognosis Prognosis for Safe Diet Advancement: Good      Swallow Study   General Date of  Onset: 07/12/2020 HPI: Pt is an 86 y.o. female with medical history significant for paroxysmal atrial fibrillation on Eliquis, hypertension, hypothyroidism, GERD, and anxiety who presented to the ED for evaluation of shortness of  breath and cough.  CXR revealed interval development of bilateral pleural effusions, right greater than left, with associated compressive atelectasis. On date of admission, pt reported that she was taking her afternoon medications (Eliquis, Tylenol, and vitamin supplements) when she choked on the pills, began to have nonproductive coughing fits, and increased SOB. Pt reported to referring MD, that she has "frequent issues with choking on her pills, most notably on her potassium supplement which she takes every morning." CT chest 11/15: Multifocal pulmonary opacities, infection and/or aspiration. Fluid in the right-sided endobronchial tree, for which aspiration should be excluded clinically. CXR 11/17: Pleural effusions bilaterally with multifocal pneumonia, more on the left. Bedside swallow evaluation pm 1114 was negative for signs of aspiration.  Type of Study: Bedside Swallow Evaluation Previous Swallow Assessment: See HPI Diet Prior to this Study: Regular;Thin liquids Temperature Spikes Noted: No Respiratory Status: Nasal cannula History of Recent Intubation: No Behavior/Cognition: Alert;Cooperative;Pleasant mood Oral Cavity Assessment: Within Functional Limits Oral Care Completed by SLP: No Oral Cavity - Dentition: Adequate natural dentition Vision: Impaired for self-feeding Self-Feeding Abilities: Able to feed self Patient Positioning: Upright in bed;Postural control adequate for testing Baseline Vocal Quality: Normal Volitional Cough: Strong Volitional Swallow: Able to elicit    Oral/Motor/Sensory Function Overall Oral Motor/Sensory Function: Within functional limits   Ice Chips Ice chips: Within functional limits Presentation: Spoon   Thin Liquid Thin Liquid: Within functional limits Presentation: Straw    Nectar Thick Nectar Thick Liquid: Not tested   Honey Thick Honey Thick Liquid: Not tested   Puree Puree: Within functional limits Presentation: Spoon;Self Fed   Solid      Solid: Within functional limits Presentation: Self Fed     [name] I. Chelsea Clock, MS, CCC-SLP Acute Rehabilitation Services Office number [phone] Pager [phone]  [name] 07/02/2020,10:39 AM

## 2020-07-02 NOTE — Care Management Important Message (Signed)
Important Message  Patient Details  Name: [name] MRN: [id number] Date of Birth: 1930-11-08   Medicare Important Message Given:  Yes     Renie Ora 07/02/2020, 12:15 PM

## 2020-07-02 NOTE — Progress Notes (Signed)
Physical Therapy Treatment Patient Details Name: [name] MRN: [id number] DOB: 12-May-1931 Today's Date: 07/02/2020    History of Present Illness This 64 admitted with SOB and cough.  Dx:  acute respiratory failrue with hypoxia, acute on chronic diastolic CHF, pleural effusions, hypnatremia, paroxysmal A-Fib.  PMH includes: unspecified tinnitus, hereditary ideopathic peripheral neuropathy, HTN, senile osteoporosis, OA, macular degeneration, diaphragmatic hernia, anxiety     PT Comments    Pt making steady progress with mobility. Pt has acrylic nails. Moved SpO2 probe to ear lobe and pt with incr SpO2.    Follow Up Recommendations  Home health PT;Supervision/Assistance - 24 hour     Equipment Recommendations  None recommended by PT    Recommendations for Other Services       Precautions / Restrictions Precautions Precautions: None    Mobility  Bed Mobility Overal bed mobility: Needs Assistance Bed Mobility: Supine to Sit     Supine to sit: Supervision     General bed mobility comments: supervision for lines  Transfers Overall transfer level: Needs assistance Equipment used: Rolling walker (2 wheeled) Transfers: Sit to/from [employer] Transfers Sit to Stand: Supervision Stand pivot transfers: Min guard       General transfer comment: Assist for safety/lines  Ambulation/Gait Ambulation/Gait assistance: Supervision Gait Distance (Feet): 150 Feet Assistive device: 4-wheeled walker Gait Pattern/deviations: Step-through pattern;Decreased stride length;Trunk flexed Gait velocity: decr Gait velocity interpretation: 1.31 - 2.62 ft/sec, indicative of limited community ambulator General Gait Details: Assist for safety and lines   Stairs             Wheelchair Mobility    Modified Rankin (Stroke Patients Only)       Balance Overall balance assessment: Needs assistance Sitting-balance support: Feet supported;No upper extremity  supported Sitting balance-[name]: Good     Standing balance support: No upper extremity supported Standing balance-[name]: Fair                              Cognition Arousal/Alertness: Awake/alert Behavior During Therapy: WFL for tasks assessed/performed Overall Cognitive Status: Within Functional Limits for tasks assessed                                        Exercises      General Comments General comments (skin integrity, edema, etc.): At rest 90% on 6L with SpO2 probe on finger. Pt with acrylic nails. Placed SpO2 probe on ear lobe with SpO2 reading to 97%. Amb on 6L with SpO2 reading 89-91%. After sitting SpO2 back to 97%. Decr O2 to 4L with SpO2 94%. Nurse made aware.       Pertinent Vitals/Pain Pain Assessment: No/denies pain    Home Living                      Prior Function            PT Goals (current goals can now be found in the care plan section) Acute Rehab PT Goals Patient Stated Goal: to go home  Progress towards PT goals: Progressing toward goals    Frequency    Min 3X/week      PT Plan Current plan remains appropriate    Co-evaluation              AM-PAC PT "6 Clicks" Mobility   Outcome Measure  Help needed turning from your back to your side while in a flat bed without using bedrails?: None Help needed moving from lying on your back to sitting on the side of a flat bed without using bedrails?: None Help needed moving to and from a bed to a chair (including a wheelchair)?: A Little Help needed standing up from a chair using your arms (e.g., wheelchair or bedside chair)?: A Little Help needed to walk in hospital room?: A Little Help needed climbing 3-5 steps with a railing? : A Little 6 Click Score: 20    End of Session Equipment Utilized During Treatment: Gait belt;Oxygen Activity Tolerance: Patient tolerated treatment well Patient left: in chair;with call bell/phone within reach;with  chair alarm set Nurse Communication: Mobility status (decr SpO2) PT Visit Diagnosis: Unsteadiness on feet (R26.81);Muscle weakness (generalized) (M62.81);Other abnormalities of gait and mobility (R26.89)     Time: 2336-1224 PT Time Calculation (min) (ACUTE ONLY): 22 min  Charges:  $Gait Training: 8-22 mins                     [facility] PT Acute Rehabilitation Services Pager [phone] Office [phone]    Angelina Ok [facility] 07/02/2020, 1:28 PM

## 2020-07-02 NOTE — Progress Notes (Signed)
Patient ID: [name], female   DOB: [birth year], 86 y.o.   MRN: [id number]  PROGRESS NOTE    JAELYN BOURGOIN  XBM:[id number] DOB: 1930-10-20 DOA: July 04, 2020 PCP: Kermit Balo, DO   Brief Narrative:   86 y.o. femalewith medical history significant forparoxysmal atrial fibrillation on Eliquis, hypertension, hypothyroidism, GERD, and anxiety. Patient presented secondary to dyspnea with concern for heart failure exacerbation. Lasix started.  Lasix was subsequently discontinued due to worsening hyponatremia.  She was also started on Augmentin.  Subsequently cardiology was consulted.   Assessment & Plan:   Acute hypoxic respiratory failure -Currently still requiring 10 L oxygen via high flow nasal cannula.  Wean off as able.  Antibiotics and diuretics plan as below  Possible community-acquired pneumonia/aspiration pneumonia -CT chest showed multifocal opacities.  Augmentin was switched to IV Unasyn on 07/01/2020. -SLP evaluation  Acute on chronic diastolic heart failure -Last echo from 5/121 showed grade 1 diastolic dysfunction.  Initially diuresed with Lasix but subsequently discontinued because of worsening hyponatremia. -Strict input and output.  Daily weights.  Fluid restriction. -IV Lasix was resumed on 07/01/2020.  Continue IV Lasix.  Cardiology evaluation appreciated.  Paroxysmal A. Fib -Continue Eliquis and metoprolol.  Cardiology following.  Hyponatremia -Possibly from volume overload.  Patient on hydrochlorothiazide and sertraline as an outpatient. -Sodium 124 today.  Continue Lasix.  Will also had sodium chloride tablets.  Hypomagnesemia -Replace.  Repeat a.m. labs  Hypothyroidism -Continue Synthroid  Hypertension -Continue irbesartan and amlodipine and Lasix.  Hydrochlorothiazide on hold.  Blood pressure stable.  GERD -Continue Protonix  Anxiety -Continue Zoloft along with Xanax as needed  Generalized deconditioning -will need home health PT    DVT  prophylaxis: Eliquis Code Status: DNR Family Communication: None at bedside Disposition Plan: Status is: Inpatient  Remains inpatient appropriate because:Inpatient level of care appropriate due to severity of illness   Dispo: The patient is from: ILF               Anticipated d/c is to: ILF with [facility]              Anticipated d/c date is: > 3 days              Patient currently is not medically stable to d/c.  Consultants: cardiology  Procedures: None  Antimicrobials:  Anti-infectives (From admission, onward)   Start     Dose/Rate Route Frequency Ordered Stop   07/01/20 1400  Ampicillin-Sulbactam (UNASYN) 3 g in sodium chloride 0.9 % 100 mL IVPB        3 g 200 mL/hr over 30 Minutes Intravenous Every 8 hours 07/01/20 1250     06/30/20 1145  amoxicillin-clavulanate (AUGMENTIN) 875-125 MG per tablet 1 tablet  Status:  Discontinued        1 tablet Oral Every 12 hours 06/30/20 1055 07/01/20 1138       Subjective: Patient seen and examined at bedside.  Poor historian.  Still short of breath with minimal exertion cough.  No overnight fever, vomiting or chest pain reported. Objective: Vitals:   07/01/20 1535 07/01/20 2008 07/02/20 0500 07/02/20 0644  BP: (!) 151/77 (!) 156/88 122/77 126/86  Pulse: 87 82 93   Resp: 18 20 18    Temp: 97.7 F (36.5 C) 97.6 F (36.4 C) 97.6 F (36.4 C)   TempSrc: Oral Oral Oral   SpO2: 92% 90% 91%   Weight:   58.2 kg   Height:        Intake/Output Summary (Last  24 hours) at 07/02/2020 0803 Last data filed at 07/02/2020 0553 Gross per 24 hour  Intake 250 ml  Output --  Net 250 ml   Filed Weights   06/30/20 0423 07/01/20 0600 07/02/20 0500  Weight: 56.7 kg 56.6 kg 58.2 kg    Examination:  General exam: Looks chronically ill.  Still requiring 10 L oxygen via nasal cannula respiratory system: Decreased breath sounds at bases with some crackles, no wheezing.  Intermittently tachypneic  cardiovascular system: Rate controlled, S1-S2 heard   gastrointestinal system: Abdomen is nondistended, soft and nontender.  Bowel sounds are heard  extremities: Mild lower extremity edema present.  No clubbing Central nervous system: Awake, slow to respond.  No focal neurological deficits.  Moves extremities Skin: No obvious ecchymosis/lesions  psychiatry: Affect is flat.    Data Reviewed: I have personally reviewed following labs and imaging studies  CBC: Recent Labs  Lab 07-14-2020 2232 Jul 14, 2020 2232 2020/07/14 2241 07/14/20 2246 06/28/20 0521 07/01/20 0407 07/02/20 0224  WBC 9.4  --   --   --  12.2* 11.0* 11.3*  NEUTROABS 8.1*  --   --   --   --   --  9.5*  HGB 13.6   < > 15.0 14.6 13.7 12.9 13.7  HCT 41.3   < > 44.0 43.0 41.5 39.0 41.0  MCV 88.8  --   --   --  89.1 88.8 88.6  PLT 168  --   --   --  152 134* 145*   < > = values in this interval not displayed.   Basic Metabolic Panel: Recent Labs  Lab 06/28/20 0521 06/28/20 1502 06/29/20 1555 06/30/20 0300 06/30/20 1648 07/01/20 0407 07/02/20 0224  NA 125*   < > 123* 124* 123* 123* 124*  K 3.9   < > 4.2 4.8 5.0 5.2* 3.6  CL 81*   < > 77* 80* 79* 80* 73*  CO2 33*   < > 36* 36* 35* 35* 38*  GLUCOSE 154*   < > 157* 104* 168* 99 100*  BUN 14   < > 16 14 13 13 13   CREATININE 0.60   < > 0.53 0.55 0.51 0.48 0.46  CALCIUM 9.2   < > 9.4 9.3 9.2 9.2 8.9  MG 2.6*  --   --   --   --   --  1.4*   < > = values in this interval not displayed.   GFR: Estimated Creatinine Clearance: 45.5 mL/min (by C-G formula based on SCr of 0.46 mg/dL). Liver Function Tests: Recent Labs  Lab 07/14/20 2232  AST 24  ALT 24  ALKPHOS 115  BILITOT 0.6  PROT 7.3  ALBUMIN 4.1   No results for input(s): LIPASE, AMYLASE in the last 168 hours. No results for input(s): AMMONIA in the last 168 hours. Coagulation Profile: No results for input(s): INR, PROTIME in the last 168 hours. Cardiac Enzymes: No results for input(s): CKTOTAL, CKMB, CKMBINDEX, TROPONINI in the last 168 hours. BNP (last 3  results) No results for input(s): PROBNP in the last 8760 hours. HbA1C: No results for input(s): HGBA1C in the last 72 hours. CBG: Recent Labs  Lab 06/29/20 0811 06/29/20 1208  GLUCAP 114* 138*   Lipid Profile: No results for input(s): CHOL, HDL, LDLCALC, TRIG, CHOLHDL, LDLDIRECT in the last 72 hours. Thyroid Function Tests: No results for input(s): TSH, T4TOTAL, FREET4, T3FREE, THYROIDAB in the last 72 hours. Anemia Panel: No results for input(s): VITAMINB12, FOLATE, FERRITIN, TIBC, IRON, RETICCTPCT  in the last 72 hours. Sepsis Labs: No results for input(s): PROCALCITON, LATICACIDVEN in the last 168 hours.  Recent Results (from the past 240 hour(s))  Respiratory Panel by RT PCR (Flu A&B, Covid) - Nasopharyngeal Swab     Status: None   Collection Time: 07/02/2020 11:36 PM   Specimen: Nasopharyngeal Swab  Result Value Ref Range Status   SARS Coronavirus 2 by RT PCR NEGATIVE NEGATIVE Final    Comment: (NOTE) SARS-CoV-2 target nucleic acids are NOT DETECTED.  The SARS-CoV-2 RNA is generally detectable in upper respiratoy specimens during the acute phase of infection. The lowest concentration of SARS-CoV-2 viral copies this assay can detect is 131 copies/mL. A negative result does not preclude SARS-Cov-2 infection and should not be used as the sole basis for treatment or other patient management decisions. A negative result may occur with  improper specimen collection/handling, submission of specimen other than nasopharyngeal swab, presence of viral mutation(s) within the areas targeted by this assay, and inadequate number of viral copies (<131 copies/mL). A negative result must be combined with clinical observations, patient history, and epidemiological information. The expected result is Negative.  Fact Sheet for Patients:  https://www.moore.com/https://www.fda.gov/media/142436/download  Fact Sheet for Healthcare Providers:  https://www.young.biz/https://www.fda.gov/media/142435/download  This test is no t yet  approved or cleared by the [place] FDA and  has been authorized for detection and/or diagnosis of SARS-CoV-2 by FDA under an Emergency Use Authorization (EUA). This EUA will remain  in effect (meaning this test can be used) for the duration of the COVID-19 declaration under Section 564(b)(1) of the Act, 21 U.S.C. section 360bbb-3(b)(1), unless the authorization is terminated or revoked sooner.     Influenza A by PCR NEGATIVE NEGATIVE Final   Influenza B by PCR NEGATIVE NEGATIVE Final    Comment: (NOTE) The Xpert Xpress SARS-CoV-2/FLU/RSV assay is intended as an aid in  the diagnosis of influenza from Nasopharyngeal swab specimens and  should not be used as a sole basis for treatment. Nasal washings and  aspirates are unacceptable for Xpert Xpress SARS-CoV-2/FLU/RSV  testing.  Fact Sheet for Patients: https://www.moore.com/https://www.fda.gov/media/142436/download  Fact Sheet for Healthcare Providers: https://www.young.biz/https://www.fda.gov/media/142435/download  This test is not yet approved or cleared by the [place] FDA and  has been authorized for detection and/or diagnosis of SARS-CoV-2 by  FDA under an Emergency Use Authorization (EUA). This EUA will remain  in effect (meaning this test can be used) for the duration of the  Covid-19 declaration under Section 564(b)(1) of the Act, 21  U.S.C. section 360bbb-3(b)(1), unless the authorization is  terminated or revoked. Performed at [facility], [address] [place], [place] [address]   Respiratory Panel by PCR     Status: None   Collection Time: 06/29/20  3:29 PM   Specimen: Nasopharyngeal Swab; Respiratory  Result Value Ref Range Status   Adenovirus NOT DETECTED NOT DETECTED Final   Coronavirus 229E NOT DETECTED NOT DETECTED Final    Comment: (NOTE) The Coronavirus on the Respiratory Panel, DOES NOT test for the novel  Coronavirus (2019 nCoV)    Coronavirus HKU1 NOT DETECTED NOT DETECTED Final   Coronavirus NL63 NOT DETECTED NOT DETECTED  Final   Coronavirus OC43 NOT DETECTED NOT DETECTED Final   Metapneumovirus NOT DETECTED NOT DETECTED Final   Rhinovirus / Enterovirus NOT DETECTED NOT DETECTED Final   Influenza A NOT DETECTED NOT DETECTED Final   Influenza B NOT DETECTED NOT DETECTED Final   Parainfluenza Virus 1 NOT DETECTED NOT DETECTED Final  Parainfluenza Virus 2 NOT DETECTED NOT DETECTED Final   Parainfluenza Virus 3 NOT DETECTED NOT DETECTED Final   Parainfluenza Virus 4 NOT DETECTED NOT DETECTED Final   Respiratory Syncytial Virus NOT DETECTED NOT DETECTED Final   Bordetella pertussis NOT DETECTED NOT DETECTED Final   Chlamydophila pneumoniae NOT DETECTED NOT DETECTED Final   Mycoplasma pneumoniae NOT DETECTED NOT DETECTED Final    Comment: Performed at [facility], [address] [place], [place] [address]         Radiology Studies: DG CHEST PORT 1 VIEW  Result Date: 07/01/2020 CLINICAL DATA:  Shortness of breath EXAM: PORTABLE CHEST 1 VIEW COMPARISON:  June 29, 2020 chest radiograph and chest CT FINDINGS: There are pleural effusions bilaterally with foci of airspace opacity in the left mid lung and both lower lung regions, similar to recent prior studies. There is also ill-defined opacity more superiorly in the left upper lobe. No new opacity is appreciable. Heart is upper normal in size with pulmonary vascularity within normal limits. No adenopathy. There is aortic atherosclerosis. No bone lesions. IMPRESSION: Pleural effusions bilaterally with multifocal pneumonia, more on the left than on the right, stable. Stable cardiac prominence. No adenopathy appreciable. Aortic Atherosclerosis (ICD10-I70.0). Electronically Signed   By: Bretta Bang III M.D.   On: 07/01/2020 08:27        Scheduled Meds: . amLODipine  10 mg Oral QPM  . apixaban  2.5 mg Oral BID  . furosemide  60 mg Intravenous Q12H  . irbesartan  150 mg Oral Daily  . levothyroxine  100 mcg Oral Q0600  . melatonin  3 mg Oral QHS   . metoprolol succinate  100 mg Oral q AM  . metoprolol succinate  50 mg Oral QPM  . pantoprazole  40 mg Oral Daily  . potassium chloride  20 mEq Oral Daily  . sertraline  50 mg Oral q AM  . sodium chloride flush  3 mL Intravenous Q12H  . temazepam  30 mg Oral QHS   Continuous Infusions: . ampicillin-sulbactam (UNASYN) IV 3 g (07/02/20 0521)          Glade Lloyd, MD [facility] 07/02/2020, 8:03 AM

## 2020-07-03 DIAGNOSIS — J9601 Acute respiratory failure with hypoxia: Secondary | ICD-10-CM | POA: Diagnosis not present

## 2020-07-03 DIAGNOSIS — J690 Pneumonitis due to inhalation of food and vomit: Secondary | ICD-10-CM | POA: Diagnosis not present

## 2020-07-03 DIAGNOSIS — I5033 Acute on chronic diastolic (congestive) heart failure: Secondary | ICD-10-CM | POA: Diagnosis not present

## 2020-07-03 DIAGNOSIS — E871 Hypo-osmolality and hyponatremia: Secondary | ICD-10-CM | POA: Diagnosis not present

## 2020-07-03 LAB — COMPREHENSIVE METABOLIC PANEL
ALT: 16 U/L (ref 0–44)
AST: 21 U/L (ref 15–41)
Albumin: 2.6 g/dL — ABNORMAL LOW (ref 3.5–5.0)
Alkaline Phosphatase: 79 U/L (ref 38–126)
Anion gap: 12 (ref 5–15)
BUN: 15 mg/dL (ref 8–23)
CO2: 41 mmol/L — ABNORMAL HIGH (ref 22–32)
Calcium: 8.5 mg/dL — ABNORMAL LOW (ref 8.9–10.3)
Chloride: 75 mmol/L — ABNORMAL LOW (ref 98–111)
Creatinine, Ser: 0.52 mg/dL (ref 0.44–1.00)
GFR, Estimated: >60 mL/min (ref >60)
Glucose, Bld: 98 mg/dL (ref 70–99)
Potassium: 3.7 mmol/L (ref 3.5–5.1)
Sodium: 128 mmol/L — ABNORMAL LOW (ref 135–145)
Total Bilirubin: 1.3 mg/dL — ABNORMAL HIGH (ref 0.3–1.2)
Total Protein: 5.7 g/dL — ABNORMAL LOW (ref 6.5–8.1)

## 2020-07-03 LAB — CBC WITH DIFFERENTIAL/PLATELET
Abs Immature Granulocytes: 0.05 10*3/uL (ref 0.00–0.07)
Basophils Absolute: 0 10*3/uL (ref 0.0–0.1)
Basophils Relative: 0 %
Eosinophils Absolute: 0 10*3/uL (ref 0.0–0.5)
Eosinophils Relative: 0 %
HCT: 39.3 % (ref 36.0–46.0)
Hemoglobin: 13.2 g/dL (ref 12.0–15.0)
Immature Granulocytes: 0 %
Lymphocytes Relative: 6 %
Lymphs Abs: 0.7 10*3/uL (ref 0.7–4.0)
MCH: 29.3 pg (ref 26.0–34.0)
MCHC: 33.6 g/dL (ref 30.0–36.0)
MCV: 87.1 fL (ref 80.0–100.0)
Monocytes Absolute: 1.4 10*3/uL — ABNORMAL HIGH (ref 0.1–1.0)
Monocytes Relative: 12 %
Neutro Abs: 9.3 10*3/uL — ABNORMAL HIGH (ref 1.7–7.7)
Neutrophils Relative %: 82 %
Platelets: 170 10*3/uL (ref 150–400)
RBC: 4.51 MIL/uL (ref 3.87–5.11)
RDW: 13.2 % (ref 11.5–15.5)
WBC: 11.4 10*3/uL — ABNORMAL HIGH (ref 4.0–10.5)
nRBC: 0 % (ref 0.0–0.2)

## 2020-07-03 LAB — MAGNESIUM: Magnesium: 1.8 mg/dL (ref 1.7–2.4)

## 2020-07-03 MED ORDER — FUROSEMIDE 10 MG/ML IJ SOLN
40.0000 mg | Freq: Two times a day (BID) | INTRAMUSCULAR | Status: DC
  Administered 2020-07-03 – 2020-07-04 (×3): 40 mg via INTRAVENOUS
  Filled 2020-07-03 (×3): qty 4

## 2020-07-03 NOTE — Plan of Care (Signed)
  Problem: Education: Goal: Knowledge of disease or condition will improve Outcome: Progressing   Problem: Activity: Goal: Ability to tolerate increased activity will improve Outcome: Progressing   Problem: Education: Goal: Knowledge of General Education information will improve Description: Including pain rating scale, medication(s)/side effects and non-pharmacologic comfort measures Outcome: Progressing   

## 2020-07-03 NOTE — Progress Notes (Signed)
  Speech Language Pathology Treatment: Dysphagia  Patient Details Name: [name] MRN: [id number] DOB: 30-Jan-1931 Today's Date: 07/03/2020 Time: 9485-4627 SLP Time Calculation (min) (ACUTE ONLY): 25 min  Assessment / Plan / Recommendation Clinical Impression  Pt was seen for dysphagia treatment during dinner. She was cooperative throughout the session and reported that she has been observing swallowing precautions but occasional forgets to do the chin tuck with L head turn. Pt consumed rice and nectar thick liquids via straw without overt s/sx of aspiration when postural modifications were implemented. Coughing was noted once with liquids when pt did not use the chin tuck posture. However, she quickly stated, "See, I forget sometimes" and self-corrected. Pt independently used secondary swallows and a liquid wash after every 2-3 solid bolus.  Dysphagia exercises were deferred to allow pt to consume dinner. SLP will continue to follow pt.   HPI HPI: Pt is an 86 y.o. female with medical history significant for paroxysmal atrial fibrillation on Eliquis, hypertension, hypothyroidism, GERD, and anxiety who presented to the ED for evaluation of shortness of breath and cough.  CXR revealed interval development of bilateral pleural effusions, right greater than left, with associated compressive atelectasis. On date of admission, pt reported that she was taking her afternoon medications (Eliquis, Tylenol, and vitamin supplements) when she choked on the pills, began to have nonproductive coughing fits, and increased SOB. Pt reported to referring MD, that she has "frequent issues with choking on her pills, most notably on her potassium supplement which she takes every morning." CT chest 11/15: Multifocal pulmonary opacities, infection and/or aspiration. Fluid in the right-sided endobronchial tree, for which aspiration should be excluded clinically. CXR 11/17: Pleural effusions bilaterally with multifocal  pneumonia, more on the left. Bedside swallow evaluation pm 1114 was negative for signs of aspiration.       SLP Plan  Continue with current plan of care       Recommendations  Diet recommendations: Dysphagia 3 (mechanical soft);Nectar-thick liquid Liquids provided via: Cup;Straw Medication Administration: Whole meds with puree Supervision: Intermittent supervision to cue for compensatory strategies Compensations: Slow rate;Small sips/bites;Chin tuck (chin tuck with left head turn) Postural Changes and/or Swallow Maneuvers: Seated upright 90 degrees                Oral Care Recommendations: Oral care BID Follow up Recommendations: Home health SLP SLP Visit Diagnosis: Dysphagia, unspecified (R13.10) Plan: Continue with current plan of care       [name] I. Vear Clock, MS, CCC-SLP Acute Rehabilitation Services Office number [phone] Pager [phone]                Scheryl Marten 07/03/2020, 5:12 PM

## 2020-07-03 NOTE — Progress Notes (Signed)
Occupational Therapy Treatment Patient Details Name: [name] MRN: [id number] DOB: 02-21-1931 Today's Date: 07/03/2020    History of present illness This 86 admitted with SOB and cough.  Dx:  acute respiratory failrue with hypoxia, acute on chronic diastolic CHF, pleural effusions, hypnatremia, paroxysmal A-Fib.  PMH includes: unspecified tinnitus, hereditary ideopathic peripheral neuropathy, HTN, senile osteoporosis, OA, macular degeneration, diaphragmatic hernia, anxiety    OT comments  Patient continues to need setup, and up to [facility] for grooming, mobility/toilet, and ADL primarily due to pre-morbid visual deficits.  Otherwise, her barrier here in the acute setting is oxygen desaturation.  Her O2 stayed above 94% at 4 L of O2, hopefully we can begin to trial her at lower O2 in subsequent treatments.  OT will continue to follow her in the acute setting, HH has been recommended for home.    Follow Up Recommendations  Home health OT;Supervision - Intermittent    Equipment Recommendations  None recommended by OT    Recommendations for Other Services      Precautions / Restrictions Precautions Precautions: Fall Restrictions Other Position/Activity Restrictions: poor vision - macular degeneration at baseline       Mobility Bed Mobility Overal bed mobility: Modified Independent Bed Mobility: Supine to Sit     Supine to sit: Modified independent (Device/Increase time)        Transfers Overall transfer level: Needs assistance Equipment used: Rolling walker (2 wheeled) Transfers: Sit to/from [employer] Sit to Stand: Min guard Stand pivot transfers: Min guard       General transfer comment: Min Guard for mobility to bathroom    Balance Overall balance assessment: Mild deficits observed, not formally tested                                         ADL either performed or assessed with clinical judgement   ADL       Grooming:  Wash/dry hands;Wash/dry face;Oral care;Brushing hair;Sitting;Standing;Min guard                   Toilet Transfer: Min guard;Ambulation;RW;Grab bars   Toileting- Clothing Manipulation and Hygiene: Sit to/from stand;Min guard Toileting - Clothing Manipulation Details (indicate cue type and reason): cues for location of items needed.     Functional mobility during ADLs: Min guard;Rolling walker          Frequency  Min 2X/week        Progress Toward Goals  OT Goals(current goals can now be found in the care plan section)  Progress towards OT goals: Progressing toward goals  Acute Rehab OT Goals Patient Stated Goal: Hoping to return to her ALF OT Goal Formulation: With patient Time For Goal Achievement: 07/12/20 Potential to Achieve Goals: Good  Plan Discharge plan remains appropriate    Co-evaluation                 AM-PAC OT "6 Clicks" Daily Activity     Outcome Measure   Help from another person eating meals?: A Little Help from another person taking care of personal grooming?: A Little Help from another person toileting, which includes using toliet, bedpan, or urinal?: A Little Help from another person bathing (including washing, rinsing, drying)?: A Little Help from another person to put on and taking off regular upper body clothing?: A Little Help from another person to put on and taking off regular  lower body clothing?: A Little 6 Click Score: 18    End of Session Equipment Utilized During Treatment: Rolling walker;Oxygen  OT Visit Diagnosis: Low vision, both eyes (H54.2)   Activity Tolerance Patient tolerated treatment well   Patient Left in chair;with call bell/phone within reach   Nurse Communication Mobility status        Time: 2355-7322 OT Time Calculation (min): 23 min  Charges: OT General Charges $OT Visit: 1 Visit OT Treatments $Self Care/Home Management : 23-37 mins  07/03/2020  Rich, OTR/L  Acute Rehabilitation  Services  Office:  [phone]    Suzanna Obey 07/03/2020, 4:05 PM

## 2020-07-03 NOTE — Progress Notes (Signed)
Patient ID: [name], female   DOB: Sep 09, 1930, 86 y.o.   MRN: [id number]  PROGRESS NOTE    [name]  XTA:[id number] DOB: 19-Apr-1931 DOA: 06/19/2020 PCP: Kermit Balo, DO   Brief Narrative:   86 y.o. femalewith medical history significant forparoxysmal atrial fibrillation on Eliquis, hypertension, hypothyroidism, GERD, and anxiety. Patient presented secondary to dyspnea with concern for heart failure exacerbation. Lasix started.  Lasix was subsequently discontinued due to worsening hyponatremia.  She was also started on Augmentin.  Subsequently cardiology was consulted.   Assessment & Plan:   Acute hypoxic respiratory failure -Slightly improved than yesterday: Currently requiring 5 L oxygen via nasal cannula.  Wean off as able.  Antibiotics and diuretics plan as below  Possible community-acquired pneumonia/aspiration pneumonia -CT chest showed multifocal opacities.  Augmentin was switched to IV Unasyn on 07/01/2020.  Finish 5 to 7-day course of therapy. -Diet as per SLP recommendations.  Acute on chronic diastolic heart failure -Last echo from 5/121 showed grade 1 diastolic dysfunction.  Initially diuresed with Lasix but subsequently discontinued because of worsening hyponatremia. -Strict input and output.  Daily weights.  Fluid restriction. -IV Lasix was resumed on 07/01/2020.  Continue IV Lasix for 1 more day and possibly switch to oral tomorrow.  Cardiology evaluation appreciated.  Paroxysmal A. Fib -Continue Eliquis and metoprolol.  Cardiology following intermittently.  Hyponatremia -Possibly from volume overload.  Patient on hydrochlorothiazide and sertraline as an outpatient. -Sodium 128 today.  Continue Lasix.  Continue salt tablets.  Hypomagnesemia -Improved.  Hypothyroidism -Continue Synthroid  Hypertension -Continue irbesartan and amlodipine and Lasix.  Hydrochlorothiazide on hold.  Blood pressure stable.  GERD -Continue Protonix  Anxiety -Continue  Zoloft along with Xanax as needed  Generalized deconditioning -will need home health PT    DVT prophylaxis: Eliquis Code Status: DNR Family Communication: Daughter at bedside Disposition Plan: Status is: Inpatient  Remains inpatient appropriate because:Inpatient level of care appropriate due to severity of illness   Dispo: The patient is from: ILF               Anticipated d/c is to: ILF with HH              Anticipated d/c date is: 3 days              Patient currently is not medically stable to d/c.  Consultants: cardiology  Procedures: None  Antimicrobials:  Anti-infectives (From admission, onward)   Start     Dose/Rate Route Frequency Ordered Stop   07/01/20 1400  Ampicillin-Sulbactam (UNASYN) 3 g in sodium chloride 0.9 % 100 mL IVPB        3 g 200 mL/hr over 30 Minutes Intravenous Every 8 hours 07/01/20 1250     06/30/20 1145  amoxicillin-clavulanate (AUGMENTIN) 875-125 MG per tablet 1 tablet  Status:  Discontinued        1 tablet Oral Every 12 hours 06/30/20 1055 07/01/20 1138       Subjective: Patient seen and examined at bedside.  Patient is a poor historian.  Daughter at bedside feels that she is slightly slow to respond today but breathing is improving.  Patient feels slightly better but still short of breath with minimal exertion with cough.  Denies any chest pain.  No overnight fever or vomiting reported.  Objective: Vitals:   07/02/20 0644 07/02/20 1512 07/02/20 2041 07/03/20 0300  BP: 126/86 125/72 133/71 132/69  Pulse:  71 77 65  Resp:  15 20 18   Temp:  )  97.5 F (36.4 C) 97.9 F (36.6 C) 97.6 F (36.4 C)  TempSrc:  Oral Oral Oral  SpO2:  94% 93% 97%  Weight:    62.1 kg  Height:        Intake/Output Summary (Last 24 hours) at 07/03/2020 0752 Last data filed at 07/02/2020 1611 Gross per 24 hour  Intake 300 ml  Output 400 ml  Net -100 ml   Filed Weights   07/01/20 0600 07/02/20 0500 07/03/20 0300  Weight: 56.6 kg 58.2 kg 62.1 kg     Examination:  General exam: Chronically ill looking.  No distress.  Currently on 5 L oxygen via nasal cannula  respiratory system: Bilateral decreased breath sounds at bases with some crackles  cardiovascular system: S1-S2 heard, rate controlled gastrointestinal system: Abdomen is nondistended, soft and nontender.  Normal bowel sounds heard  extremities: No cyanosis.  Bilateral lower extremity trace edema present Central nervous system: Alert, still slow to respond.  No focal neurological deficits.  Moving extremities Skin: No obvious rashes/petechiae  psychiatry: Flat affect.    Data Reviewed: I have personally reviewed following labs and imaging studies  CBC: Recent Labs  Lab 25-Jul-2020 2232 07/25/2020 2241 07/25/20 2246 06/28/20 0521 07/01/20 0407 07/02/20 0224 07/03/20 0254  WBC 9.4  --   --  12.2* 11.0* 11.3* 11.4*  NEUTROABS 8.1*  --   --   --   --  9.5* 9.3*  HGB 13.6   < > 14.6 13.7 12.9 13.7 13.2  HCT 41.3   < > 43.0 41.5 39.0 41.0 39.3  MCV 88.8  --   --  89.1 88.8 88.6 87.1  PLT 168  --   --  152 134* 145* 170   < > = values in this interval not displayed.   Basic Metabolic Panel: Recent Labs  Lab 06/28/20 0521 06/28/20 1502 06/30/20 0300 06/30/20 1648 07/01/20 0407 07/02/20 0224 07/03/20 0254  NA 125*   < > 124* 123* 123* 124* 128*  K 3.9   < > 4.8 5.0 5.2* 3.6 3.7  CL 81*   < > 80* 79* 80* 73* 75*  CO2 33*   < > 36* 35* 35* 38* 41*  GLUCOSE 154*   < > 104* 168* 99 100* 98  BUN 14   < > CREATININE 0.60   < > 0.55 0.51 0.48 0.46 0.52  CALCIUM 9.2   < > 9.3 9.2 9.2 8.9 8.5*  MG 2.6*  --   --   --   --  1.4* 1.8   < > = values in this interval not displayed.   GFR: Estimated Creatinine Clearance: 48.6 mL/min (by C-G formula based on SCr of 0.52 mg/dL). Liver Function Tests: Recent Labs  Lab 2020-07-25 2232 07/03/20 0254  AST 24 21  ALT 24 16  ALKPHOS 115 79  BILITOT 0.6 1.3*  PROT 7.3 5.7*  ALBUMIN 4.1 2.6*   No results for  input(s): LIPASE, AMYLASE in the last 168 hours. No results for input(s): AMMONIA in the last 168 hours. Coagulation Profile: No results for input(s): INR, PROTIME in the last 168 hours. Cardiac Enzymes: No results for input(s): CKTOTAL, CKMB, CKMBINDEX, TROPONINI in the last 168 hours. BNP (last 3 results) No results for input(s): PROBNP in the last 8760 hours. HbA1C: No results for input(s): HGBA1C in the last 72 hours. CBG: Recent Labs  Lab 06/29/20 0811 06/29/20 1208  GLUCAP 114* 138*   Lipid Profile: No results  for input(s): CHOL, HDL, LDLCALC, TRIG, CHOLHDL, LDLDIRECT in the last 72 hours. Thyroid Function Tests: No results for input(s): TSH, T4TOTAL, FREET4, T3FREE, THYROIDAB in the last 72 hours. Anemia Panel: No results for input(s): VITAMINB12, FOLATE, FERRITIN, TIBC, IRON, RETICCTPCT in the last 72 hours. Sepsis Labs: No results for input(s): PROCALCITON, LATICACIDVEN in the last 168 hours.  Recent Results (from the past 240 hour(s))  Respiratory Panel by RT PCR (Flu A&B, Covid) - Nasopharyngeal Swab     Status: None   Collection Time: 07-01-20 11:36 PM   Specimen: Nasopharyngeal Swab  Result Value Ref Range Status   SARS Coronavirus 2 by RT PCR NEGATIVE NEGATIVE Final    Comment: (NOTE) SARS-CoV-2 target nucleic acids are NOT DETECTED.  The SARS-CoV-2 RNA is generally detectable in upper respiratoy specimens during the acute phase of infection. The lowest concentration of SARS-CoV-2 viral copies this assay can detect is 131 copies/mL. A negative result does not preclude SARS-Cov-2 infection and should not be used as the sole basis for treatment or other patient management decisions. A negative result may occur with  improper specimen collection/handling, submission of specimen other than nasopharyngeal swab, presence of viral mutation(s) within the areas targeted by this assay, and inadequate number of viral copies (<131 copies/mL). A negative result must be  combined with clinical observations, patient history, and epidemiological information. The expected result is Negative.  Fact Sheet for Patients:  https://www.moore.com/  Fact Sheet for Healthcare Providers:  https://www.young.biz/  This test is no t yet approved or cleared by the [place] FDA and  has been authorized for detection and/or diagnosis of SARS-CoV-2 by FDA under an Emergency Use Authorization (EUA). This EUA will remain  in effect (meaning this test can be used) for the duration of the COVID-19 declaration under Section 564(b)(1) of the Act, 21 U.S.C. section 360bbb-3(b)(1), unless the authorization is terminated or revoked sooner.     Influenza A by PCR NEGATIVE NEGATIVE Final   Influenza B by PCR NEGATIVE NEGATIVE Final    Comment: (NOTE) The Xpert Xpress SARS-CoV-2/FLU/RSV assay is intended as an aid in  the diagnosis of influenza from Nasopharyngeal swab specimens and  should not be used as a sole basis for treatment. Nasal washings and  aspirates are unacceptable for Xpert Xpress SARS-CoV-2/FLU/RSV  testing.  Fact Sheet for Patients: https://www.moore.com/  Fact Sheet for Healthcare Providers: https://www.young.biz/  This test is not yet approved or cleared by the [place] FDA and  has been authorized for detection and/or diagnosis of SARS-CoV-2 by  FDA under an Emergency Use Authorization (EUA). This EUA will remain  in effect (meaning this test can be used) for the duration of the  Covid-19 declaration under Section 564(b)(1) of the Act, 21  U.S.C. section 360bbb-3(b)(1), unless the authorization is  terminated or revoked. Performed at [facility], [address], [place], [place] [address]   Respiratory Panel by PCR     Status: None   Collection Time: 06/29/20  3:29 PM   Specimen: Nasopharyngeal Swab; Respiratory  Result Value Ref Range Status   Adenovirus  NOT DETECTED NOT DETECTED Final   Coronavirus 229E NOT DETECTED NOT DETECTED Final    Comment: (NOTE) The Coronavirus on the Respiratory Panel, DOES NOT test for the novel  Coronavirus (2019 nCoV)    Coronavirus HKU1 NOT DETECTED NOT DETECTED Final   Coronavirus NL63 NOT DETECTED NOT DETECTED Final   Coronavirus OC43 NOT DETECTED NOT DETECTED Final   Metapneumovirus NOT DETECTED NOT  DETECTED Final   Rhinovirus / Enterovirus NOT DETECTED NOT DETECTED Final   Influenza A NOT DETECTED NOT DETECTED Final   Influenza B NOT DETECTED NOT DETECTED Final   Parainfluenza Virus 1 NOT DETECTED NOT DETECTED Final   Parainfluenza Virus 2 NOT DETECTED NOT DETECTED Final   Parainfluenza Virus 3 NOT DETECTED NOT DETECTED Final   Parainfluenza Virus 4 NOT DETECTED NOT DETECTED Final   Respiratory Syncytial Virus NOT DETECTED NOT DETECTED Final   Bordetella pertussis NOT DETECTED NOT DETECTED Final   Chlamydophila pneumoniae NOT DETECTED NOT DETECTED Final   Mycoplasma pneumoniae NOT DETECTED NOT DETECTED Final    Comment: Performed at [facility], [address], [place], [place] [address]         Radiology Studies: DG CHEST PORT 1 VIEW  Result Date: 07/01/2020 CLINICAL DATA:  Shortness of breath EXAM: PORTABLE CHEST 1 VIEW COMPARISON:  June 29, 2020 chest radiograph and chest CT FINDINGS: There are pleural effusions bilaterally with foci of airspace opacity in the left mid lung and both lower lung regions, similar to recent prior studies. There is also ill-defined opacity more superiorly in the left upper lobe. No new opacity is appreciable. Heart is upper normal in size with pulmonary vascularity within normal limits. No adenopathy. There is aortic atherosclerosis. No bone lesions. IMPRESSION: Pleural effusions bilaterally with multifocal pneumonia, more on the left than on the right, stable. Stable cardiac prominence. No adenopathy appreciable. Aortic Atherosclerosis (ICD10-I70.0).  Electronically Signed   By: Bretta Bang III M.D.   On: 07/01/2020 08:27   DG Swallowing Func-Speech Pathology  Result Date: 07/02/2020 Objective Swallowing Evaluation: Type of Study: MBS-Modified Barium Swallow Study  Patient Details Name: [name] MRN: [id number] Date of Birth: May 12, 1931 Today's Date: 07/02/2020 Time: SLP Start Time (ACUTE ONLY): 1345 -SLP Stop Time (ACUTE ONLY): 1414 SLP Time Calculation (min) (ACUTE ONLY): 29 min Past Medical History: Past Medical History: Diagnosis Date . Allergic rhinitis due to pollen  . Anxiety state, unspecified  . Calculus of kidney  . Cataract  . Diaphragmatic hernia without mention of obstruction or gangrene  . Diffuse cystic mastopathy  . Dizziness and giddiness  . GERD (gastroesophageal reflux disease)  . Hiatal hernia  . Insomnia, unspecified  . Insomnia, unspecified  . Lumbago  . Macular degeneration (senile) of retina, unspecified  . Open wound of toe(s), without mention of complication  . Osteoarthrosis, unspecified whether generalized or localized, unspecified site  . Other and unspecified hyperlipidemia  . PAF (paroxysmal atrial fibrillation) (HCC)  . Personal history of fall  . Sciatica  . Sebaceous cyst  . Senile osteoporosis  . Unspecified constipation  . Unspecified essential hypertension  . Unspecified hereditary and idiopathic peripheral neuropathy  . Unspecified hypothyroidism  . Unspecified tinnitus  Past Surgical History: Past Surgical History: Procedure Laterality Date . ABDOMINAL HYSTERECTOMY   . CATARACT EXTRACTION, BILATERAL   . CHOLECYSTECTOMY  07/25/2007 . TONSILLECTOMY AND ADENOIDECTOMY Bilateral 1943 HPI: Pt is an 86 y.o. female with medical history significant for paroxysmal atrial fibrillation on Eliquis, hypertension, hypothyroidism, GERD, and anxiety who presented to the ED for evaluation of shortness of breath and cough.  CXR revealed interval development of bilateral pleural effusions, right greater than left, with associated  compressive atelectasis. On date of admission, pt reported that she was taking her afternoon medications (Eliquis, Tylenol, and vitamin supplements) when she choked on the pills, began to have nonproductive coughing fits, and increased SOB. Pt reported to referring MD, that  she has "frequent issues with choking on her pills, most notably on her potassium supplement which she takes every morning." CT chest 11/15: Multifocal pulmonary opacities, infection and/or aspiration. Fluid in the right-sided endobronchial tree, for which aspiration should be excluded clinically. CXR 11/17: Pleural effusions bilaterally with multifocal pneumonia, more on the left. Bedside swallow evaluation on 11/14 was negative for signs of aspiration.  No data recorded Assessment / Plan / Recommendation CHL IP CLINICAL IMPRESSIONS 07/02/2020 Clinical Impression  Pt presented with oropharyngeal dysphagia characterized by impaired bolus cohesion, reduced lingual retraction, and reduced pharyngeal constriction. She demonstrated premature spillage to the valleculae, base of tongue residue, vallecular residue, and posterior pharyngeal wall residue. Incomplete epiglottic inversion was noted secondary to cervical kyphosis which limited range of motion of the epiglottis. Penetration (PAS 3) and aspiration (PAS 7, 8) were noted with thin liquids and nectar thick liquids via cup and straw due to incomplete epiglottic inversion. Moderate posterior pharyngeal wall residue was noted with regular texture solids; aspiration was noted with this consistency during secondary swallows due to residue being pushed into the larynx when epiglottic inversion was attempted. Penetration and aspiration were eliminated with nectar thick liquids when a chin tuck posture was used with a left head turn. However, penetration and aspiration were still inconsistently noted with thin liquids and with larger boluses of solids. Esophageal screening revealed retention of barium in  the upper thoracic esophagus; consider dedicated esophageal assessment to determine etiology. A dysphagia 3 diet with nectar thick liquids is recommended at this time with strict observance of swallowing precautions noted below. SLP will follow for dysphagia treatment.  SLP Visit Diagnosis Dysphagia, unspecified (R13.10) Attention and concentration deficit following -- Frontal lobe and executive function deficit following -- Impact on safety and function Mild aspiration risk;Moderate aspiration risk   CHL IP TREATMENT RECOMMENDATION 07/02/2020 Treatment Recommendations Therapy as outlined in treatment plan below   Prognosis 07/02/2020 Prognosis for Safe Diet Advancement Good Barriers to Reach Goals Severity of deficits Barriers/Prognosis Comment -- CHL IP DIET RECOMMENDATION 07/02/2020 SLP Diet Recommendations Dysphagia 3 (Mech soft) solids;Nectar thick liquid Liquid Administration via Cup;Straw Medication Administration Whole meds with puree Compensations Slow rate;Small sips/bites;Chin tuck Postural Changes Remain semi-upright after after feeds/meals (Comment);Seated upright at 90 degrees   CHL IP OTHER RECOMMENDATIONS 07/02/2020 Recommended Consults Other (Comment) Oral Care Recommendations Oral care BID Other Recommendations --   CHL IP FOLLOW UP RECOMMENDATIONS 07/02/2020 Follow up Recommendations Home health SLP   CHL IP FREQUENCY AND DURATION 07/02/2020 Speech Therapy Frequency (ACUTE ONLY) min 2x/week Treatment Duration 2 weeks      CHL IP ORAL PHASE 07/02/2020 Oral Phase WFL Oral - Pudding Teaspoon -- Oral - Pudding Cup -- Oral - Honey Teaspoon -- Oral - Honey Cup -- Oral - Nectar Teaspoon -- Oral - Nectar Cup -- Oral - Nectar Straw -- Oral - Thin Teaspoon -- Oral - Thin Cup -- Oral - Thin Straw -- Oral - Puree -- Oral - Mech Soft -- Oral - Regular -- Oral - Multi-Consistency -- Oral - Pill -- Oral Phase - Comment --  CHL IP PHARYNGEAL PHASE 07/02/2020 Pharyngeal Phase Impaired Pharyngeal- Pudding Teaspoon  -- Pharyngeal -- Pharyngeal- Pudding Cup -- Pharyngeal -- Pharyngeal- Honey Teaspoon -- Pharyngeal -- Pharyngeal- Honey Cup -- Pharyngeal -- Pharyngeal- Nectar Teaspoon -- Pharyngeal -- Pharyngeal- Nectar Cup Reduced epiglottic inversion;Delayed swallow initiation-vallecula;Reduced tongue base retraction;Penetration/Aspiration during swallow;Penetration/Apiration after swallow;Trace aspiration;Moderate aspiration;Pharyngeal residue - valleculae;Pharyngeal residue - posterior pharnyx;Reduced pharyngeal peristalsis Pharyngeal Material enters airway, remains ABOVE  vocal cords and not ejected out;Material enters airway, passes BELOW cords and not ejected out despite cough attempt by patient;Material enters airway, passes BELOW cords without attempt by patient to eject out (silent aspiration) Pharyngeal- Nectar Straw Reduced epiglottic inversion;Delayed swallow initiation-vallecula;Reduced tongue base retraction;Penetration/Aspiration during swallow;Penetration/Apiration after swallow;Trace aspiration;Moderate aspiration;Pharyngeal residue - valleculae;Pharyngeal residue - posterior pharnyx;Reduced pharyngeal peristalsis Pharyngeal Material enters airway, remains ABOVE vocal cords and not ejected out;Material enters airway, passes BELOW cords and not ejected out despite cough attempt by patient Pharyngeal- Thin Teaspoon -- Pharyngeal -- Pharyngeal- Thin Cup Reduced epiglottic inversion;Delayed swallow initiation-vallecula;Reduced tongue base retraction;Penetration/Aspiration during swallow;Penetration/Apiration after swallow;Trace aspiration;Moderate aspiration;Pharyngeal residue - valleculae;Pharyngeal residue - posterior pharnyx;Reduced pharyngeal peristalsis Pharyngeal Material enters airway, remains ABOVE vocal cords and not ejected out;Material enters airway, passes BELOW cords and not ejected out despite cough attempt by patient Pharyngeal- Thin Straw Reduced epiglottic inversion;Delayed swallow  initiation-vallecula;Reduced tongue base retraction;Penetration/Aspiration during swallow;Penetration/Apiration after swallow;Trace aspiration;Moderate aspiration;Pharyngeal residue - valleculae;Pharyngeal residue - posterior pharnyx;Reduced pharyngeal peristalsis Pharyngeal Material enters airway, remains ABOVE vocal cords and not ejected out;Material enters airway, passes BELOW cords and not ejected out despite cough attempt by patient;Material enters airway, passes BELOW cords without attempt by patient to eject out (silent aspiration) Pharyngeal- Puree Reduced epiglottic inversion;Delayed swallow initiation-vallecula;Reduced tongue base retraction;Pharyngeal residue - valleculae;Pharyngeal residue - posterior pharnyx;Reduced pharyngeal peristalsis Pharyngeal -- Pharyngeal- Mechanical Soft Reduced epiglottic inversion;Delayed swallow initiation-vallecula;Reduced tongue base retraction;Pharyngeal residue - valleculae;Pharyngeal residue - posterior pharnyx;Reduced pharyngeal peristalsis Pharyngeal -- Pharyngeal- Regular Reduced epiglottic inversion;Delayed swallow initiation-vallecula;Reduced tongue base retraction;Pharyngeal residue - valleculae;Pharyngeal residue - posterior pharnyx;Reduced pharyngeal peristalsis;Penetration/Apiration after swallow Pharyngeal Material enters airway, remains ABOVE vocal cords and not ejected out;Material enters airway, passes BELOW cords and not ejected out despite cough attempt by patient Pharyngeal- Multi-consistency -- Pharyngeal -- Pharyngeal- Pill Reduced epiglottic inversion;Delayed swallow initiation-vallecula;Reduced tongue base retraction;Pharyngeal residue - valleculae;Pharyngeal residue - posterior pharnyx;Reduced pharyngeal peristalsis Pharyngeal -- Pharyngeal Comment --  CHL IP CERVICAL ESOPHAGEAL PHASE 07/02/2020 Cervical Esophageal Phase Impaired Pudding Teaspoon -- Pudding Cup -- Honey Teaspoon -- Honey Cup -- Nectar Teaspoon -- Nectar Cup -- Nectar Straw -- Thin  Teaspoon -- Thin Cup -- Thin Straw -- Puree (No Data) Mechanical Soft -- Regular -- Multi-consistency -- Pill -- Cervical Esophageal Comment Retention of barium in the upper thoracic esophagus Shanika I. Vear Clock, MS, CCC-SLP Acute Rehabilitation Services Office number [phone] Pager [phone] Scheryl Marten 07/02/2020, 4:00 PM                   Scheduled Meds: . amLODipine  10 mg Oral QPM  . apixaban  2.5 mg Oral BID  . furosemide  60 mg Intravenous Q12H  . irbesartan  150 mg Oral Daily  . levothyroxine  100 mcg Oral Q0600  . melatonin  3 mg Oral QHS  . metoprolol succinate  100 mg Oral q AM  . metoprolol succinate  50 mg Oral QPM  . pantoprazole  40 mg Oral Daily  . potassium chloride  20 mEq Oral Daily  . sertraline  50 mg Oral q AM  . sodium chloride flush  3 mL Intravenous Q12H  . sodium chloride  1 g Oral TID WC  . temazepam  30 mg Oral QHS   Continuous Infusions: . ampicillin-sulbactam (UNASYN) IV 3 g (07/03/20 6578)          Glade Lloyd, MD [facility] 07/03/2020, 7:52 AM

## 2020-07-03 NOTE — Progress Notes (Signed)
SLP Cancellation Note  Patient Details Name: [name] MRN: [id number] DOB: February 18, 1931   Cancelled treatment:       Reason Eval/Treat Not Completed: Patient at procedure or test/unavailable (Pt working with OT. SLP will follow up. )  Jaleiyah Alas I. Vear Clock, MS, CCC-SLP Acute Rehabilitation Services Office number [phone] Pager [phone]  Scheryl Marten 07/03/2020, 3:24 PM

## 2020-07-03 NOTE — Discharge Instructions (Signed)

## 2020-07-04 DIAGNOSIS — J690 Pneumonitis due to inhalation of food and vomit: Secondary | ICD-10-CM | POA: Diagnosis not present

## 2020-07-04 DIAGNOSIS — J9601 Acute respiratory failure with hypoxia: Secondary | ICD-10-CM | POA: Diagnosis not present

## 2020-07-04 DIAGNOSIS — J90 Pleural effusion, not elsewhere classified: Secondary | ICD-10-CM

## 2020-07-04 DIAGNOSIS — I10 Essential (primary) hypertension: Secondary | ICD-10-CM | POA: Diagnosis not present

## 2020-07-04 DIAGNOSIS — I5033 Acute on chronic diastolic (congestive) heart failure: Secondary | ICD-10-CM | POA: Diagnosis not present

## 2020-07-04 LAB — CBC WITH DIFFERENTIAL/PLATELET
Abs Immature Granulocytes: 0.04 10*3/uL (ref 0.00–0.07)
Basophils Absolute: 0 10*3/uL (ref 0.0–0.1)
Basophils Relative: 0 %
Eosinophils Absolute: 0.1 10*3/uL (ref 0.0–0.5)
Eosinophils Relative: 2 %
HCT: 37.9 % (ref 36.0–46.0)
Hemoglobin: 12.5 g/dL (ref 12.0–15.0)
Immature Granulocytes: 1 %
Lymphocytes Relative: 9 %
Lymphs Abs: 0.7 10*3/uL (ref 0.7–4.0)
MCH: 29.3 pg (ref 26.0–34.0)
MCHC: 33 g/dL (ref 30.0–36.0)
MCV: 89 fL (ref 80.0–100.0)
Monocytes Absolute: 1.2 10*3/uL — ABNORMAL HIGH (ref 0.1–1.0)
Monocytes Relative: 15 %
Neutro Abs: 6.1 10*3/uL (ref 1.7–7.7)
Neutrophils Relative %: 73 %
Platelets: 149 10*3/uL — ABNORMAL LOW (ref 150–400)
RBC: 4.26 MIL/uL (ref 3.87–5.11)
RDW: 13.1 % (ref 11.5–15.5)
WBC: 8.3 10*3/uL (ref 4.0–10.5)
nRBC: 0 % (ref 0.0–0.2)

## 2020-07-04 LAB — BASIC METABOLIC PANEL
Anion gap: 13 (ref 5–15)
BUN: 13 mg/dL (ref 8–23)
CO2: 39 mmol/L — ABNORMAL HIGH (ref 22–32)
Calcium: 8.7 mg/dL — ABNORMAL LOW (ref 8.9–10.3)
Chloride: 79 mmol/L — ABNORMAL LOW (ref 98–111)
Creatinine, Ser: 0.53 mg/dL (ref 0.44–1.00)
GFR, Estimated: >60 mL/min (ref >60)
Glucose, Bld: 100 mg/dL — ABNORMAL HIGH (ref 70–99)
Potassium: 2.9 mmol/L — ABNORMAL LOW (ref 3.5–5.1)
Sodium: 131 mmol/L — ABNORMAL LOW (ref 135–145)

## 2020-07-04 LAB — MAGNESIUM: Magnesium: 1.5 mg/dL — ABNORMAL LOW (ref 1.7–2.4)

## 2020-07-04 NOTE — Progress Notes (Signed)
Subjective:  Continues to have shortness of breath and cough, but improving Remains hypoxic in high 80s on 5 L [place]  Afib with slow RVR noted overnight  Objective:  Vital Signs in the last 24 hours: Temp:  [97.6 F (36.4 C)-98.6 F (37 C)] 98.6 F (37 C) (11/20 0406) Pulse Rate:  [68-79] 77 (11/20 0406) Resp:  [17-22] 17 (11/20 0406) BP: (109-125)/(62-92) 109/62 (11/20 0406) SpO2:  [90 %-98 %] 98 % (11/20 0406) Weight:  [61.6 kg] 61.6 kg (11/20 0406)  Intake/Output from previous day: 11/19 0701 - 11/20 0700 In: 483 [P.O.:480; I.V.:3] Out: 550 [Urine:550]  Physical Exam Vitals and nursing note reviewed.  Constitutional:      General: She is not in acute distress.    Appearance: She is well-developed.  HENT:     Head: Normocephalic and atraumatic.  Eyes:     Conjunctiva/sclera: Conjunctivae normal.     Pupils: Pupils are equal, round, and reactive to light.  Neck:     Vascular: No JVD.  Cardiovascular:     Rate and Rhythm: Normal rate. Rhythm irregular.     Pulses: Normal pulses and intact distal pulses.     Heart sounds: No murmur heard.   Pulmonary:     Effort: Pulmonary effort is normal.     Breath sounds: Examination of the right-lower field reveals decreased breath sounds. Examination of the left-lower field reveals decreased breath sounds. Decreased breath sounds present. No wheezing or rales.  Abdominal:     General: Bowel sounds are normal.     Palpations: Abdomen is soft.     Tenderness: There is no rebound.  Musculoskeletal:        General: No tenderness. Normal range of motion.     Left lower leg: No edema.  Lymphadenopathy:     Cervical: No cervical adenopathy.  Skin:    General: Skin is warm and dry.  Neurological:     Mental Status: She is alert and oriented to person, place, and time.     Cranial Nerves: No cranial nerve deficit.      Lab Results: BMP Recent Labs    11/17/19 1224 11/17/19 1224 01/14/20 1134 07/05/2020 2232 07/02/20 0224  07/03/20 0254 07/04/20 0832  NA 129*  --  136   < > 124* 128* 131*  K 4.2   < > 4.3   < > 3.6 3.7 2.9*  CL 87*   < > 92*   < > 73* 75* 79*  CO2 31   < > 31*   < > 38* 41* 39*  GLUCOSE 131*  --  137*   < > 100* 98 100*  BUN 12  --  22   < > 13 15 13   CREATININE 0.72   < > 0.91   < > 0.46 0.52 0.53  CALCIUM 9.3   < > 9.9   < > 8.9 8.5* 8.7*  GFRNONAA >60   < > 57*   < > >60 >60 >60  GFRAA >60  --  66  --   --   --   --    < > = values in this interval not displayed.    CBC Recent Labs  Lab 07/04/20 0832  WBC 8.3  RBC 4.26  HGB 12.5  HCT 37.9  PLT 149*  MCV 89.0  MCH 29.3  MCHC 33.0  RDW 13.1  LYMPHSABS 0.7  MONOABS 1.2*  EOSABS 0.1  BASOSABS 0.0    HEMOGLOBIN A1C Lab Results  Component Value Date   HGBA1C  07/23/2007    5.7 (NOTE)   The ADA recommends the following therapeutic goals for glycemic   control related to Hgb A1C measurement:   Goal of Therapy:   < 7.0% Hgb A1C   Action Suggested:  > 8.0% Hgb A1C   Ref:  Diabetes Care, 22, Suppl. 1, 1999   MPG 126 07/23/2007    Cardiac Panel (last 3 results) No results for input(s): CKTOTAL, CKMB, TROPONINI, RELINDX in the last 8760 hours.  BNP (last 3 results) Recent Labs    11/17/19 1224 06/17/2020 2232  BNP 307.3* 456.1*    TSH Recent Labs    11/17/19 1224 06/28/20 0500  TSH 3.002 1.713    Lipid Panel     Component Value Date/Time   CHOL 175 03/05/2018 1017   CHOL 131 07/30/2015 1053   TRIG 147 03/05/2018 1017   HDL 59 03/05/2018 1017   HDL 59 07/30/2015 1053   CHOLHDL 3.0 03/05/2018 1017   VLDL 24 10/05/2016 0957   LDLCALC 92 03/05/2018 1017     Hepatic Function Panel Recent Labs    07/03/2020 2232 07/03/20 0254  PROT 7.3 5.7*  ALBUMIN 4.1 2.6*  AST 24 21  ALT 24 16  ALKPHOS 115 79  BILITOT 0.6 1.3*   Cardiac studies:  EKG 07/07/2020:  Atrial fibrillation, 84 bpm, normal axis, poor R wave progression, rare premature ventricular contractions, consider old anterior infarct, nonspecific  ST-T changes either due to inferolateral ischemia or ST-T changes due to LVH. Compared to prior EKG normal sinus rhythm is transition to atrial fibrillation.  Echocardiogram 06/28/2020: LVEF 60-65%, no regional wall motion abnormalities, diastolic dysfunction could not be evaluated, moderate pulmonary hypertension with RVSP 51 mmHg, severely dilated biatrial enlargement, mild to moderate MR, mild to moderate TR, aortic valve sclerosis without stenosis.  Echocardiogram 12/20/2019:  Normal LV systolic function with visual EF 55-60%. Left ventricle cavity is normal in size. Mild concentric hypertrophy of the left ventricle.  Normal global wall motion. Doppler evidence of grade II (pseudonormal) diastolic dysfunction, elevated LAP. Calculated EF 50%.  Patient was in sinus rhythm during exam.  Left atrial cavity is severely dilated with LA voume index of 70 mL/m2.  Structurally normal mitral valve. Mild (Grade I) mitral regurgitation.  Structurally normal tricuspid valve. Mild tricuspid regurgitation. No evidence of pulmonary hypertension. RVSP measures 30 mmHg.  Lexiscan Tetrofosmin stress test 12/30/2019: No previous exam available for comparison. Lexiscan nuclear stress test performed using 1-day protocol. Stress EKG is non-diagnostic, as this is pharmacological stress test. Stress symptoms included dyspnea & chest pressure 3/10.  All segments of left ventricle demonstrated normal wall motion and thickening. Stress LVEF calculated 42%, although visually appears normal.  Intermediate risk study. Clinical correlation recommended.    Assessment & Recommendations:  86 y/o Caucasian female with hypertension, hypothyroidism, PAF, suspected aspiration pneumonia  PAF: Currently in Afib with ventricular rate in 70s-80s, with overnight rate in 30s after metoprolol xL 100 mg.  This has since been held.  Her hypoxia is due to her aspiration pneumonia. I do not think cardioversion will help with this.    In light of her slow rate on metoprolol succinate 100 mg, I will back her dose down to 50 mg, starting tonight. If she still has recurrent episodes of slow rate, metoprolol could be completely discontinued.  Continue management of aspiration pneumonia as per the primary team. CHA2DS2VASc score 4, annual stroke risk 5%. Continue eliquis 2.5 mg bid.  Nigel Mormon, MD Pager: [phone] Office: [phone]

## 2020-07-04 NOTE — Progress Notes (Signed)
Patient ID: [name], female   DOB: Jan 27, 1931, 86 y.o.   MRN: [id number]  PROGRESS NOTE    [name]  JJH:[id number] DOB: 01-12-1931 DOA: 06/17/2020 PCP: Kermit Balo, DO   Brief Narrative:   86 y.o. femalewith medical history significant forparoxysmal atrial fibrillation on Eliquis, hypertension, hypothyroidism, GERD, and anxiety. Patient presented secondary to dyspnea with concern for heart failure exacerbation. Lasix started.  Lasix was subsequently discontinued due to worsening hyponatremia.  She was also started on Augmentin.  Subsequently cardiology was consulted.   Assessment & Plan:   Acute hypoxic respiratory failure -Slightly improved than yesterday: Still requiring 5 L oxygen via nasal cannula.  Wean off as able.  Antibiotics and diuretics plan as below  Possible community-acquired pneumonia/aspiration pneumonia -CT chest showed multifocal opacities.  Augmentin was switched to IV Unasyn on 07/01/2020.  Finish 5 to 7-day course of therapy. -Diet as per SLP recommendations.  Acute on chronic diastolic heart failure -Last echo from 5/121 showed grade 1 diastolic dysfunction.  Initially diuresed with Lasix but subsequently discontinued because of worsening hyponatremia. -Strict input and output.  Daily weights.  Fluid restriction. -IV Lasix was resumed on 07/01/2020.  will switch IV Lasix to oral mostly today pending labs.  Cardiology evaluation appreciated.  Paroxysmal A. Fib -Continue Eliquis and metoprolol.  Cardiology following intermittently.  Intermittently mildly bradycardic  Hyponatremia -Possibly from volume overload.  Patient on hydrochlorothiazide and sertraline as an outpatient. -Sodium pending today.  Lasix plan as above.  Continue salt tablets.  Hypomagnesemia -Improved.  Hypothyroidism -Continue Synthroid  Hypertension -Continue irbesartan and amlodipine and Lasix.  Hydrochlorothiazide on hold.  Blood pressure stable.  GERD -Continue  Protonix  Anxiety -Continue Zoloft along with Xanax as needed  Generalized deconditioning -will need home health PT    DVT prophylaxis: Eliquis Code Status: DNR Family Communication: Daughter at bedside on 07/03/2020 Disposition Plan: Status is: Inpatient  Remains inpatient appropriate because:Inpatient level of care appropriate due to severity of illness   Dispo: The patient is from: ILF               Anticipated d/c is to: ILF with HH              Anticipated d/c date is: 3 days              Patient currently is not medically stable to d/c.  Consultants: cardiology  Procedures: None  Antimicrobials:  Anti-infectives (From admission, onward)   Start     Dose/Rate Route Frequency Ordered Stop   07/01/20 1400  Ampicillin-Sulbactam (UNASYN) 3 g in sodium chloride 0.9 % 100 mL IVPB        3 g 200 mL/hr over 30 Minutes Intravenous Every 8 hours 07/01/20 1250     06/30/20 1145  amoxicillin-clavulanate (AUGMENTIN) 875-125 MG per tablet 1 tablet  Status:  Discontinued        1 tablet Oral Every 12 hours 06/30/20 1055 07/01/20 1138       Subjective: Patient seen and examined at bedside.  Poor historian.  Slow to respond.  Denies vomiting, chest pain or fevers.  Still short of breath with minimal exertion with intermittent coughing. Objective: Vitals:   07/03/20 0944 07/03/20 1331 07/03/20 1958 07/04/20 0406  BP: 113/63 120/69 (!) 125/92 109/62  Pulse: 80 68 79 77  Resp: 19 (!) 22 19 17   Temp: 97.6 F (36.4 C) 97.7 F (36.5 C) 97.6 F (36.4 C) 98.6 F (37 C)  TempSrc: Oral Oral  Oral Oral  SpO2: 97% 91% 90% 98%  Weight:    61.6 kg  Height:        Intake/Output Summary (Last 24 hours) at 07/04/2020 0804 Last data filed at 07/04/2020 0408 Gross per 24 hour  Intake 483 ml  Output 450 ml  Net 33 ml   Filed Weights   07/02/20 0500 07/03/20 0300 07/04/20 0406  Weight: 58.2 kg 62.1 kg 61.6 kg    Examination:  General exam: Looks chronically ill.  Elderly female  lying in bed.  No distress.  Still on 5 L oxygen via nasal cannula. respiratory system: Decreased breath sounds at bases bilaterally with basilar crackles, no wheezing  cardiovascular system: Rate controlled, S1-S2 heard gastrointestinal system: Abdomen is nondistended, soft and nontender.  Bowel sounds are heard  extremities: Mild lower extremity edema present.  No cyanosis or clubbing.   Central nervous system: Awake, very slow to respond.  Poor historian.  No focal neurological deficits.  Moves extremities Skin: No obvious ecchymosis/lesions  psychiatry: Affect is flat.    Data Reviewed: I have personally reviewed following labs and imaging studies  CBC: Recent Labs  Lab 07/12/2020 2232 06/29/2020 2241 06/26/2020 2246 06/28/20 0521 07/01/20 0407 07/02/20 0224 07/03/20 0254  WBC 9.4  --   --  12.2* 11.0* 11.3* 11.4*  NEUTROABS 8.1*  --   --   --   --  9.5* 9.3*  HGB 13.6   < > 14.6 13.7 12.9 13.7 13.2  HCT 41.3   < > 43.0 41.5 39.0 41.0 39.3  MCV 88.8  --   --  89.1 88.8 88.6 87.1  PLT 168  --   --  152 134* 145* 170   < > = values in this interval not displayed.   Basic Metabolic Panel: Recent Labs  Lab 06/28/20 0521 06/28/20 1502 06/30/20 0300 06/30/20 1648 07/01/20 0407 07/02/20 0224 07/03/20 0254  NA 125*   < > 124* 123* 123* 124* 128*  K 3.9   < > 4.8 5.0 5.2* 3.6 3.7  CL 81*   < > 80* 79* 80* 73* 75*  CO2 33*   < > 36* 35* 35* 38* 41*  GLUCOSE 154*   < > 104* 168* 99 100* 98  BUN 14   < > CREATININE 0.60   < > 0.55 0.51 0.48 0.46 0.52  CALCIUM 9.2   < > 9.3 9.2 9.2 8.9 8.5*  MG 2.6*  --   --   --   --  1.4* 1.8   < > = values in this interval not displayed.   GFR: Estimated Creatinine Clearance: 48.2 mL/min (by C-G formula based on SCr of 0.52 mg/dL). Liver Function Tests: Recent Labs  Lab 07/02/2020 2232 07/03/20 0254  AST 24 21  ALT 24 16  ALKPHOS 115 79  BILITOT 0.6 1.3*  PROT 7.3 5.7*  ALBUMIN 4.1 2.6*   No results for input(s):  LIPASE, AMYLASE in the last 168 hours. No results for input(s): AMMONIA in the last 168 hours. Coagulation Profile: No results for input(s): INR, PROTIME in the last 168 hours. Cardiac Enzymes: No results for input(s): CKTOTAL, CKMB, CKMBINDEX, TROPONINI in the last 168 hours. BNP (last 3 results) No results for input(s): PROBNP in the last 8760 hours. HbA1C: No results for input(s): HGBA1C in the last 72 hours. CBG: Recent Labs  Lab 06/29/20 0811 06/29/20 1208  GLUCAP 114* 138*   Lipid Profile: No results for input(s):  CHOL, HDL, LDLCALC, TRIG, CHOLHDL, LDLDIRECT in the last 72 hours. Thyroid Function Tests: No results for input(s): TSH, T4TOTAL, FREET4, T3FREE, THYROIDAB in the last 72 hours. Anemia Panel: No results for input(s): VITAMINB12, FOLATE, FERRITIN, TIBC, IRON, RETICCTPCT in the last 72 hours. Sepsis Labs: No results for input(s): PROCALCITON, LATICACIDVEN in the last 168 hours.  Recent Results (from the past 240 hour(s))  Respiratory Panel by RT PCR (Flu A&B, Covid) - Nasopharyngeal Swab     Status: None   Collection Time: July 04, 2020 11:36 PM   Specimen: Nasopharyngeal Swab  Result Value Ref Range Status   SARS Coronavirus 2 by RT PCR NEGATIVE NEGATIVE Final    Comment: (NOTE) SARS-CoV-2 target nucleic acids are NOT DETECTED.  The SARS-CoV-2 RNA is generally detectable in upper respiratoy specimens during the acute phase of infection. The lowest concentration of SARS-CoV-2 viral copies this assay can detect is 131 copies/mL. A negative result does not preclude SARS-Cov-2 infection and should not be used as the sole basis for treatment or other patient management decisions. A negative result may occur with  improper specimen collection/handling, submission of specimen other than nasopharyngeal swab, presence of viral mutation(s) within the areas targeted by this assay, and inadequate number of viral copies (<131 copies/mL). A negative result must be combined  with clinical observations, patient history, and epidemiological information. The expected result is Negative.  Fact Sheet for Patients:  https://www.moore.com/  Fact Sheet for Healthcare Providers:  https://www.young.biz/  This test is no t yet approved or cleared by the [place] FDA and  has been authorized for detection and/or diagnosis of SARS-CoV-2 by FDA under an Emergency Use Authorization (EUA). This EUA will remain  in effect (meaning this test can be used) for the duration of the COVID-19 declaration under Section 564(b)(1) of the Act, 21 U.S.C. section 360bbb-3(b)(1), unless the authorization is terminated or revoked sooner.     Influenza A by PCR NEGATIVE NEGATIVE Final   Influenza B by PCR NEGATIVE NEGATIVE Final    Comment: (NOTE) The Xpert Xpress SARS-CoV-2/FLU/RSV assay is intended as an aid in  the diagnosis of influenza from Nasopharyngeal swab specimens and  should not be used as a sole basis for treatment. Nasal washings and  aspirates are unacceptable for Xpert Xpress SARS-CoV-2/FLU/RSV  testing.  Fact Sheet for Patients: https://www.moore.com/  Fact Sheet for Healthcare Providers: https://www.young.biz/  This test is not yet approved or cleared by the [place] FDA and  has been authorized for detection and/or diagnosis of SARS-CoV-2 by  FDA under an Emergency Use Authorization (EUA). This EUA will remain  in effect (meaning this test can be used) for the duration of the  Covid-19 declaration under Section 564(b)(1) of the Act, 21  U.S.C. section 360bbb-3(b)(1), unless the authorization is  terminated or revoked. Performed at [facility], [address] [place], [place] [address]   Respiratory Panel by PCR     Status: None   Collection Time: 06/29/20  3:29 PM   Specimen: Nasopharyngeal Swab; Respiratory  Result Value Ref Range Status   Adenovirus NOT  DETECTED NOT DETECTED Final   Coronavirus 229E NOT DETECTED NOT DETECTED Final    Comment: (NOTE) The Coronavirus on the Respiratory Panel, DOES NOT test for the novel  Coronavirus (2019 nCoV)    Coronavirus HKU1 NOT DETECTED NOT DETECTED Final   Coronavirus NL63 NOT DETECTED NOT DETECTED Final   Coronavirus OC43 NOT DETECTED NOT DETECTED Final   Metapneumovirus NOT DETECTED NOT DETECTED Final  Rhinovirus / Enterovirus NOT DETECTED NOT DETECTED Final   Influenza A NOT DETECTED NOT DETECTED Final   Influenza B NOT DETECTED NOT DETECTED Final   Parainfluenza Virus 1 NOT DETECTED NOT DETECTED Final   Parainfluenza Virus 2 NOT DETECTED NOT DETECTED Final   Parainfluenza Virus 3 NOT DETECTED NOT DETECTED Final   Parainfluenza Virus 4 NOT DETECTED NOT DETECTED Final   Respiratory Syncytial Virus NOT DETECTED NOT DETECTED Final   Bordetella pertussis NOT DETECTED NOT DETECTED Final   Chlamydophila pneumoniae NOT DETECTED NOT DETECTED Final   Mycoplasma pneumoniae NOT DETECTED NOT DETECTED Final    Comment: Performed at [facility], [address], [place], [place] [address]         Radiology Studies: DG Swallowing Func-Speech Pathology  Result Date: 07/02/2020 Objective Swallowing Evaluation: Type of Study: MBS-Modified Barium Swallow Study  Patient Details Name: [name] MRN: [id number] Date of Birth: Feb 14, 1931 Today's Date: 07/02/2020 Time: SLP Start Time (ACUTE ONLY): 1345 -SLP Stop Time (ACUTE ONLY): 1414 SLP Time Calculation (min) (ACUTE ONLY): 29 min Past Medical History: Past Medical History: Diagnosis Date . Allergic rhinitis due to pollen  . Anxiety state, unspecified  . Calculus of kidney  . Cataract  . Diaphragmatic hernia without mention of obstruction or gangrene  . Diffuse cystic mastopathy  . Dizziness and giddiness  . GERD (gastroesophageal reflux disease)  . Hiatal hernia  . Insomnia, unspecified  . Insomnia, unspecified  . Lumbago  . Macular degeneration  (senile) of retina, unspecified  . Open wound of toe(s), without mention of complication  . Osteoarthrosis, unspecified whether generalized or localized, unspecified site  . Other and unspecified hyperlipidemia  . PAF (paroxysmal atrial fibrillation) (HCC)  . Personal history of fall  . Sciatica  . Sebaceous cyst  . Senile osteoporosis  . Unspecified constipation  . Unspecified essential hypertension  . Unspecified hereditary and idiopathic peripheral neuropathy  . Unspecified hypothyroidism  . Unspecified tinnitus  Past Surgical History: Past Surgical History: Procedure Laterality Date . ABDOMINAL HYSTERECTOMY   . CATARACT EXTRACTION, BILATERAL   . CHOLECYSTECTOMY  07/25/2007 . TONSILLECTOMY AND ADENOIDECTOMY Bilateral 1943 HPI: Pt is an 86 y.o. female with medical history significant for paroxysmal atrial fibrillation on Eliquis, hypertension, hypothyroidism, GERD, and anxiety who presented to the ED for evaluation of shortness of breath and cough.  CXR revealed interval development of bilateral pleural effusions, right greater than left, with associated compressive atelectasis. On date of admission, pt reported that she was taking her afternoon medications (Eliquis, Tylenol, and vitamin supplements) when she choked on the pills, began to have nonproductive coughing fits, and increased SOB. Pt reported to referring MD, that she has "frequent issues with choking on her pills, most notably on her potassium supplement which she takes every morning." CT chest 11/15: Multifocal pulmonary opacities, infection and/or aspiration. Fluid in the right-sided endobronchial tree, for which aspiration should be excluded clinically. CXR 11/17: Pleural effusions bilaterally with multifocal pneumonia, more on the left. Bedside swallow evaluation on 11/14 was negative for signs of aspiration.  No data recorded Assessment / Plan / Recommendation CHL IP CLINICAL IMPRESSIONS 07/02/2020 Clinical Impression  Pt presented with  oropharyngeal dysphagia characterized by impaired bolus cohesion, reduced lingual retraction, and reduced pharyngeal constriction. She demonstrated premature spillage to the valleculae, base of tongue residue, vallecular residue, and posterior pharyngeal wall residue. Incomplete epiglottic inversion was noted secondary to cervical kyphosis which limited range of motion of the epiglottis. Penetration (PAS 3) and aspiration (PAS  7, 8) were noted with thin liquids and nectar thick liquids via cup and straw due to incomplete epiglottic inversion. Moderate posterior pharyngeal wall residue was noted with regular texture solids; aspiration was noted with this consistency during secondary swallows due to residue being pushed into the larynx when epiglottic inversion was attempted. Penetration and aspiration were eliminated with nectar thick liquids when a chin tuck posture was used with a left head turn. However, penetration and aspiration were still inconsistently noted with thin liquids and with larger boluses of solids. Esophageal screening revealed retention of barium in the upper thoracic esophagus; consider dedicated esophageal assessment to determine etiology. A dysphagia 3 diet with nectar thick liquids is recommended at this time with strict observance of swallowing precautions noted below. SLP will follow for dysphagia treatment.  SLP Visit Diagnosis Dysphagia, unspecified (R13.10) Attention and concentration deficit following -- Frontal lobe and executive function deficit following -- Impact on safety and function Mild aspiration risk;Moderate aspiration risk   CHL IP TREATMENT RECOMMENDATION 07/02/2020 Treatment Recommendations Therapy as outlined in treatment plan below   Prognosis 07/02/2020 Prognosis for Safe Diet Advancement Good Barriers to Reach Goals Severity of deficits Barriers/Prognosis Comment -- CHL IP DIET RECOMMENDATION 07/02/2020 SLP Diet Recommendations Dysphagia 3 (Mech soft) solids;Nectar  thick liquid Liquid Administration via Cup;Straw Medication Administration Whole meds with puree Compensations Slow rate;Small sips/bites;Chin tuck Postural Changes Remain semi-upright after after feeds/meals (Comment);Seated upright at 90 degrees   CHL IP OTHER RECOMMENDATIONS 07/02/2020 Recommended Consults Other (Comment) Oral Care Recommendations Oral care BID Other Recommendations --   CHL IP FOLLOW UP RECOMMENDATIONS 07/02/2020 Follow up Recommendations Home health SLP   CHL IP FREQUENCY AND DURATION 07/02/2020 Speech Therapy Frequency (ACUTE ONLY) min 2x/week Treatment Duration 2 weeks      CHL IP ORAL PHASE 07/02/2020 Oral Phase WFL Oral - Pudding Teaspoon -- Oral - Pudding Cup -- Oral - Honey Teaspoon -- Oral - Honey Cup -- Oral - Nectar Teaspoon -- Oral - Nectar Cup -- Oral - Nectar Straw -- Oral - Thin Teaspoon -- Oral - Thin Cup -- Oral - Thin Straw -- Oral - Puree -- Oral - Mech Soft -- Oral - Regular -- Oral - Multi-Consistency -- Oral - Pill -- Oral Phase - Comment --  CHL IP PHARYNGEAL PHASE 07/02/2020 Pharyngeal Phase Impaired Pharyngeal- Pudding Teaspoon -- Pharyngeal -- Pharyngeal- Pudding Cup -- Pharyngeal -- Pharyngeal- Honey Teaspoon -- Pharyngeal -- Pharyngeal- Honey Cup -- Pharyngeal -- Pharyngeal- Nectar Teaspoon -- Pharyngeal -- Pharyngeal- Nectar Cup Reduced epiglottic inversion;Delayed swallow initiation-vallecula;Reduced tongue base retraction;Penetration/Aspiration during swallow;Penetration/Apiration after swallow;Trace aspiration;Moderate aspiration;Pharyngeal residue - valleculae;Pharyngeal residue - posterior pharnyx;Reduced pharyngeal peristalsis Pharyngeal Material enters airway, remains ABOVE vocal cords and not ejected out;Material enters airway, passes BELOW cords and not ejected out despite cough attempt by patient;Material enters airway, passes BELOW cords without attempt by patient to eject out (silent aspiration) Pharyngeal- Nectar Straw Reduced epiglottic inversion;Delayed  swallow initiation-vallecula;Reduced tongue base retraction;Penetration/Aspiration during swallow;Penetration/Apiration after swallow;Trace aspiration;Moderate aspiration;Pharyngeal residue - valleculae;Pharyngeal residue - posterior pharnyx;Reduced pharyngeal peristalsis Pharyngeal Material enters airway, remains ABOVE vocal cords and not ejected out;Material enters airway, passes BELOW cords and not ejected out despite cough attempt by patient Pharyngeal- Thin Teaspoon -- Pharyngeal -- Pharyngeal- Thin Cup Reduced epiglottic inversion;Delayed swallow initiation-vallecula;Reduced tongue base retraction;Penetration/Aspiration during swallow;Penetration/Apiration after swallow;Trace aspiration;Moderate aspiration;Pharyngeal residue - valleculae;Pharyngeal residue - posterior pharnyx;Reduced pharyngeal peristalsis Pharyngeal Material enters airway, remains ABOVE vocal cords and not ejected out;Material enters airway, passes BELOW cords and not ejected out despite  cough attempt by patient Pharyngeal- Thin Straw Reduced epiglottic inversion;Delayed swallow initiation-vallecula;Reduced tongue base retraction;Penetration/Aspiration during swallow;Penetration/Apiration after swallow;Trace aspiration;Moderate aspiration;Pharyngeal residue - valleculae;Pharyngeal residue - posterior pharnyx;Reduced pharyngeal peristalsis Pharyngeal Material enters airway, remains ABOVE vocal cords and not ejected out;Material enters airway, passes BELOW cords and not ejected out despite cough attempt by patient;Material enters airway, passes BELOW cords without attempt by patient to eject out (silent aspiration) Pharyngeal- Puree Reduced epiglottic inversion;Delayed swallow initiation-vallecula;Reduced tongue base retraction;Pharyngeal residue - valleculae;Pharyngeal residue - posterior pharnyx;Reduced pharyngeal peristalsis Pharyngeal -- Pharyngeal- Mechanical Soft Reduced epiglottic inversion;Delayed swallow initiation-vallecula;Reduced  tongue base retraction;Pharyngeal residue - valleculae;Pharyngeal residue - posterior pharnyx;Reduced pharyngeal peristalsis Pharyngeal -- Pharyngeal- Regular Reduced epiglottic inversion;Delayed swallow initiation-vallecula;Reduced tongue base retraction;Pharyngeal residue - valleculae;Pharyngeal residue - posterior pharnyx;Reduced pharyngeal peristalsis;Penetration/Apiration after swallow Pharyngeal Material enters airway, remains ABOVE vocal cords and not ejected out;Material enters airway, passes BELOW cords and not ejected out despite cough attempt by patient Pharyngeal- Multi-consistency -- Pharyngeal -- Pharyngeal- Pill Reduced epiglottic inversion;Delayed swallow initiation-vallecula;Reduced tongue base retraction;Pharyngeal residue - valleculae;Pharyngeal residue - posterior pharnyx;Reduced pharyngeal peristalsis Pharyngeal -- Pharyngeal Comment --  CHL IP CERVICAL ESOPHAGEAL PHASE 07/02/2020 Cervical Esophageal Phase Impaired Pudding Teaspoon -- Pudding Cup -- Honey Teaspoon -- Honey Cup -- Nectar Teaspoon -- Nectar Cup -- Nectar Straw -- Thin Teaspoon -- Thin Cup -- Thin Straw -- Puree (No Data) Mechanical Soft -- Regular -- Multi-consistency -- Pill -- Cervical Esophageal Comment Retention of barium in the upper thoracic esophagus Shanika I. Vear Clock, MS, CCC-SLP [facility] Office number [phone] Pager [phone] Scheryl Marten 07/02/2020, 4:00 PM                   Scheduled Meds: . amLODipine  10 mg Oral QPM  . apixaban  2.5 mg Oral BID  . furosemide  40 mg Intravenous Q12H  . irbesartan  150 mg Oral Daily  . levothyroxine  100 mcg Oral Q0600  . melatonin  3 mg Oral QHS  . metoprolol succinate  100 mg Oral q AM  . metoprolol succinate  50 mg Oral QPM  . pantoprazole  40 mg Oral Daily  . potassium chloride  20 mEq Oral Daily  . sertraline  50 mg Oral q AM  . sodium chloride flush  3 mL Intravenous Q12H  . sodium chloride  1 g Oral TID WC  . temazepam  30  mg Oral QHS   Continuous Infusions: . ampicillin-sulbactam (UNASYN) IV 3 g (07/04/20 0535)          Glade Lloyd, MD [facility] 07/04/2020, 8:04 AM

## 2020-07-04 NOTE — Progress Notes (Signed)
Pt.had 2.04 secs.pause in the monitor the patient.was asymptomatic .Will continue to monitor pt.

## 2020-07-05 DIAGNOSIS — J690 Pneumonitis due to inhalation of food and vomit: Secondary | ICD-10-CM | POA: Diagnosis not present

## 2020-07-05 DIAGNOSIS — I5033 Acute on chronic diastolic (congestive) heart failure: Secondary | ICD-10-CM | POA: Diagnosis not present

## 2020-07-05 DIAGNOSIS — I10 Essential (primary) hypertension: Secondary | ICD-10-CM | POA: Diagnosis not present

## 2020-07-05 DIAGNOSIS — J9601 Acute respiratory failure with hypoxia: Secondary | ICD-10-CM | POA: Diagnosis not present

## 2020-07-05 LAB — BASIC METABOLIC PANEL
Anion gap: 8 (ref 5–15)
BUN: 19 mg/dL (ref 8–23)
CO2: 44 mmol/L — ABNORMAL HIGH (ref 22–32)
Calcium: 8.7 mg/dL — ABNORMAL LOW (ref 8.9–10.3)
Chloride: 82 mmol/L — ABNORMAL LOW (ref 98–111)
Creatinine, Ser: 0.63 mg/dL (ref 0.44–1.00)
GFR, Estimated: >60 mL/min (ref >60)
Glucose, Bld: 112 mg/dL — ABNORMAL HIGH (ref 70–99)
Potassium: 3.2 mmol/L — ABNORMAL LOW (ref 3.5–5.1)
Sodium: 134 mmol/L — ABNORMAL LOW (ref 135–145)

## 2020-07-05 LAB — CBC WITH DIFFERENTIAL/PLATELET
Abs Immature Granulocytes: 0.04 10*3/uL (ref 0.00–0.07)
Basophils Absolute: 0 10*3/uL (ref 0.0–0.1)
Basophils Relative: 0 %
Eosinophils Absolute: 0.1 10*3/uL (ref 0.0–0.5)
Eosinophils Relative: 1 %
HCT: 34.9 % — ABNORMAL LOW (ref 36.0–46.0)
Hemoglobin: 11.3 g/dL — ABNORMAL LOW (ref 12.0–15.0)
Immature Granulocytes: 1 %
Lymphocytes Relative: 7 %
Lymphs Abs: 0.6 10*3/uL — ABNORMAL LOW (ref 0.7–4.0)
MCH: 29.1 pg (ref 26.0–34.0)
MCHC: 32.4 g/dL (ref 30.0–36.0)
MCV: 89.9 fL (ref 80.0–100.0)
Monocytes Absolute: 0.9 10*3/uL (ref 0.1–1.0)
Monocytes Relative: 11 %
Neutro Abs: 6.9 10*3/uL (ref 1.7–7.7)
Neutrophils Relative %: 80 %
Platelets: 171 10*3/uL (ref 150–400)
RBC: 3.88 MIL/uL (ref 3.87–5.11)
RDW: 13.3 % (ref 11.5–15.5)
WBC: 8.5 10*3/uL (ref 4.0–10.5)
nRBC: 0 % (ref 0.0–0.2)

## 2020-07-05 LAB — MAGNESIUM: Magnesium: 1.6 mg/dL — ABNORMAL LOW (ref 1.7–2.4)

## 2020-07-05 MED ORDER — POTASSIUM CHLORIDE 20 MEQ PO PACK
40.0000 meq | PACK | ORAL | Status: AC
  Administered 2020-07-05 (×2): 40 meq via ORAL
  Filled 2020-07-05 (×2): qty 2

## 2020-07-05 MED ORDER — MAGNESIUM SULFATE 2 GM/50ML IV SOLN
2.0000 g | Freq: Once | INTRAVENOUS | Status: AC
  Administered 2020-07-05: 2 g via INTRAVENOUS
  Filled 2020-07-05: qty 50

## 2020-07-05 MED ORDER — FUROSEMIDE 40 MG PO TABS
40.0000 mg | ORAL_TABLET | Freq: Every day | ORAL | Status: DC
  Administered 2020-07-05 – 2020-07-06 (×2): 40 mg via ORAL
  Filled 2020-07-05 (×2): qty 1

## 2020-07-05 NOTE — Progress Notes (Signed)
Notified by CCMD patient having ventricular trigeminy. Truett Mainland MD notified. No new orders.

## 2020-07-05 NOTE — Progress Notes (Signed)
Patient ID: Chelsea Marchorma P Herbst, female   DOB: 1931/07/26, 86 y.o.   MRN: [id number]  PROGRESS NOTE    [name]  AOZ:[id number] DOB: 1931/07/26 DOA: 04-Jul-2020 PCP: Kermit Baloeed, Tiffany L, DO   Brief Narrative:   86 y.o. femalewith medical history significant forparoxysmal atrial fibrillation on Eliquis, hypertension, hypothyroidism, GERD, and anxiety. Patient presented secondary to dyspnea with concern for heart failure exacerbation. Lasix started.  Lasix was subsequently discontinued due to worsening hyponatremia.  She was also started on Augmentin.  Subsequently cardiology was consulted.   Assessment & Plan:   Acute hypoxic respiratory failure -Slightly improved than yesterday: Currently on 4 L oxygen via nasal cannula.  Wean off as able.  Antibiotics and diuretics plan as below  Possible community-acquired pneumonia/aspiration pneumonia -CT chest showed multifocal opacities.  Augmentin was switched to IV Unasyn on 07/01/2020.  Finish 7-day course of therapy. -Diet as per SLP recommendations.  Acute on chronic diastolic heart failure -Last echo from 5/121 showed grade 1 diastolic dysfunction.  Initially diuresed with Lasix but subsequently discontinued because of worsening hyponatremia. -Strict input and output.  Daily weights.  Fluid restriction. -IV Lasix was resumed on 07/01/2020.  Volume status improving.  Switch IV Lasix to oral Lasix 40 mg daily.  Paroxysmal A. Fib -Continue Eliquis and metoprolol.  Cardiology following.  Metoprolol dose has been decreased by cardiology.  Hyponatremia -Possibly from volume overload.  Patient on hydrochlorothiazide and sertraline as an outpatient. -Improving.  Sodium 134 today.  Lasix plan as above.  Decrease oral tablets to twice a day.  Hypomagnesemia -Replace.  Repeat a.m. labs  Hypokalemia -Replace.  Repeat a.m. labs  Hypothyroidism -Continue Synthroid  Hypertension -Continue irbesartan and amlodipine and Lasix.  Hydrochlorothiazide on  hold.  Blood pressure stable.  GERD -Continue Protonix  Anxiety -Continue Zoloft along with Xanax as needed  Generalized deconditioning -will need home health PT    DVT prophylaxis: Eliquis Code Status: DNR Family Communication: Daughter at bedside on 07/03/2020 Disposition Plan: Status is: Inpatient  Remains inpatient appropriate because:Inpatient level of care appropriate due to severity of illness   Dispo: The patient is from: ILF               Anticipated d/c is to: ILF with HH              Anticipated d/c date is: 2 days              Patient currently is not medically stable to d/c.  Consultants: cardiology  Procedures: None  Antimicrobials:  Anti-infectives (From admission, onward)   Start     Dose/Rate Route Frequency Ordered Stop   07/01/20 1400  Ampicillin-Sulbactam (UNASYN) 3 g in sodium chloride 0.9 % 100 mL IVPB        3 g 200 mL/hr over 30 Minutes Intravenous Every 8 hours 07/01/20 1250     06/30/20 1145  amoxicillin-clavulanate (AUGMENTIN) 875-125 MG per tablet 1 tablet  Status:  Discontinued        1 tablet Oral Every 12 hours 06/30/20 1055 07/01/20 1138       Subjective: Patient seen and examined at bedside.  Poor historian.  Wakes up slightly, still slow to respond to questions.  No worsening shortness of breath, fever or vomiting reported.   Objective: Vitals:   07/04/20 0703 07/04/20 1332 07/04/20 1925 07/05/20 0410  BP: 108/60 109/67 116/73 (!) 95/54  Pulse: (!) 57 68 66 75  Resp: 13 15 20 17   Temp: 98.4 F (36.9  C) 97.9 F (36.6 C) 98.3 F (36.8 C) 97.6 F (36.4 C)  TempSrc: Oral Oral Oral Oral  SpO2: 96% 91% 95% 94%  Weight:    60.6 kg  Height:        Intake/Output Summary (Last 24 hours) at 07/05/2020 0751 Last data filed at 07/05/2020 0552 Gross per 24 hour  Intake 320 ml  Output 500 ml  Net -180 ml   Filed Weights   07/03/20 0300 07/04/20 0406 07/05/20 0410  Weight: 62.1 kg 61.6 kg 60.6 kg    Examination:  General  exam: Chronically ill looking elderly female lying in bed.  No acute distress.  Currently on 4 L oxygen via nasal cannula  respiratory system: Bilateral decreased breath sounds at bases with bibasilar crackles.  cardiovascular system: S1-S2 heard, currently rate controlled gastrointestinal system: Abdomen is nondistended, soft and nontender.  Normal bowel sounds heard  extremities: No clubbing.  Trace lower extremity edema present Central nervous system: Wakes up slightly, still very slow to respond.  Poor historian.  No focal neurological deficits.  Moving extremities  skin: No obvious petechiae/rashes psychiatry: Flat affect   Data Reviewed: I have personally reviewed following labs and imaging studies  CBC: Recent Labs  Lab 07/01/20 0407 07/02/20 0224 07/03/20 0254 07/04/20 0832 07/05/20 0248  WBC 11.0* 11.3* 11.4* 8.3 8.5  NEUTROABS  --  9.5* 9.3* 6.1 6.9  HGB 12.9 13.7 13.2 12.5 11.3*  HCT 39.0 41.0 39.3 37.9 34.9*  MCV 88.8 88.6 87.1 89.0 89.9  PLT 134* 145* 170 149* 171   Basic Metabolic Panel: Recent Labs  Lab 07/01/20 0407 07/02/20 0224 07/03/20 0254 07/04/20 0832 07/05/20 0248  NA 123* 124* 128* 131* 134*  K 5.2* 3.6 3.7 2.9* 3.2*  CL 80* 73* 75* 79* 82*  CO2 35* 38* 41* 39* 44*  GLUCOSE 99 100* 98 100* 112*  BUN 13 13 15 13 19   CREATININE 0.48 0.46 0.52 0.53 0.63  CALCIUM 9.2 8.9 8.5* 8.7* 8.7*  MG  --  1.4* 1.8 1.5* 1.6*   GFR: Estimated Creatinine Clearance: 47.4 mL/min (by C-G formula based on SCr of 0.63 mg/dL). Liver Function Tests: Recent Labs  Lab 07/03/20 0254  AST 21  ALT 16  ALKPHOS 79  BILITOT 1.3*  PROT 5.7*  ALBUMIN 2.6*   No results for input(s): LIPASE, AMYLASE in the last 168 hours. No results for input(s): AMMONIA in the last 168 hours. Coagulation Profile: No results for input(s): INR, PROTIME in the last 168 hours. Cardiac Enzymes: No results for input(s): CKTOTAL, CKMB, CKMBINDEX, TROPONINI in the last 168 hours. BNP (last  3 results) No results for input(s): PROBNP in the last 8760 hours. HbA1C: No results for input(s): HGBA1C in the last 72 hours. CBG: Recent Labs  Lab 06/29/20 0811 06/29/20 1208  GLUCAP 114* 138*   Lipid Profile: No results for input(s): CHOL, HDL, LDLCALC, TRIG, CHOLHDL, LDLDIRECT in the last 72 hours. Thyroid Function Tests: No results for input(s): TSH, T4TOTAL, FREET4, T3FREE, THYROIDAB in the last 72 hours. Anemia Panel: No results for input(s): VITAMINB12, FOLATE, FERRITIN, TIBC, IRON, RETICCTPCT in the last 72 hours. Sepsis Labs: No results for input(s): PROCALCITON, LATICACIDVEN in the last 168 hours.  Recent Results (from the past 240 hour(s))  Respiratory Panel by RT PCR (Flu A&B, Covid) - Nasopharyngeal Swab     Status: None   Collection Time: 2020-07-09 11:36 PM   Specimen: Nasopharyngeal Swab  Result Value Ref Range Status   SARS Coronavirus 2 by  RT PCR NEGATIVE NEGATIVE Final    Comment: (NOTE) SARS-CoV-2 target nucleic acids are NOT DETECTED.  The SARS-CoV-2 RNA is generally detectable in upper respiratoy specimens during the acute phase of infection. The lowest concentration of SARS-CoV-2 viral copies this assay can detect is 131 copies/mL. A negative result does not preclude SARS-Cov-2 infection and should not be used as the sole basis for treatment or other patient management decisions. A negative result may occur with  improper specimen collection/handling, submission of specimen other than nasopharyngeal swab, presence of viral mutation(s) within the areas targeted by this assay, and inadequate number of viral copies (<131 copies/mL). A negative result must be combined with clinical observations, patient history, and epidemiological information. The expected result is Negative.  Fact Sheet for Patients:  https://www.moore.com/  Fact Sheet for Healthcare Providers:  https://www.young.biz/  This test is no t yet  approved or cleared by the [place] FDA and  has been authorized for detection and/or diagnosis of SARS-CoV-2 by FDA under an Emergency Use Authorization (EUA). This EUA will remain  in effect (meaning this test can be used) for the duration of the COVID-19 declaration under Section 564(b)(1) of the Act, 21 U.S.C. section 360bbb-3(b)(1), unless the authorization is terminated or revoked sooner.     Influenza A by PCR NEGATIVE NEGATIVE Final   Influenza B by PCR NEGATIVE NEGATIVE Final    Comment: (NOTE) The Xpert Xpress SARS-CoV-2/FLU/RSV assay is intended as an aid in  the diagnosis of influenza from Nasopharyngeal swab specimens and  should not be used as a sole basis for treatment. Nasal washings and  aspirates are unacceptable for Xpert Xpress SARS-CoV-2/FLU/RSV  testing.  Fact Sheet for Patients: https://www.moore.com/  Fact Sheet for Healthcare Providers: https://www.young.biz/  This test is not yet approved or cleared by the [place] FDA and  has been authorized for detection and/or diagnosis of SARS-CoV-2 by  FDA under an Emergency Use Authorization (EUA). This EUA will remain  in effect (meaning this test can be used) for the duration of the  Covid-19 declaration under Section 564(b)(1) of the Act, 21  U.S.C. section 360bbb-3(b)(1), unless the authorization is  terminated or revoked. Performed at [facility], [address], [place], [place] [address]   Respiratory Panel by PCR     Status: None   Collection Time: 06/29/20  3:29 PM   Specimen: Nasopharyngeal Swab; Respiratory  Result Value Ref Range Status   Adenovirus NOT DETECTED NOT DETECTED Final   Coronavirus 229E NOT DETECTED NOT DETECTED Final    Comment: (NOTE) The Coronavirus on the Respiratory Panel, DOES NOT test for the novel  Coronavirus (2019 nCoV)    Coronavirus HKU1 NOT DETECTED NOT DETECTED Final   Coronavirus NL63 NOT DETECTED NOT DETECTED  Final   Coronavirus OC43 NOT DETECTED NOT DETECTED Final   Metapneumovirus NOT DETECTED NOT DETECTED Final   Rhinovirus / Enterovirus NOT DETECTED NOT DETECTED Final   Influenza A NOT DETECTED NOT DETECTED Final   Influenza B NOT DETECTED NOT DETECTED Final   Parainfluenza Virus 1 NOT DETECTED NOT DETECTED Final   Parainfluenza Virus 2 NOT DETECTED NOT DETECTED Final   Parainfluenza Virus 3 NOT DETECTED NOT DETECTED Final   Parainfluenza Virus 4 NOT DETECTED NOT DETECTED Final   Respiratory Syncytial Virus NOT DETECTED NOT DETECTED Final   Bordetella pertussis NOT DETECTED NOT DETECTED Final   Chlamydophila pneumoniae NOT DETECTED NOT DETECTED Final   Mycoplasma pneumoniae NOT DETECTED NOT DETECTED Final  Comment: Performed at [facility], [address], [place], [place] [address]         Radiology Studies: No results found.      Scheduled Meds: . amLODipine  10 mg Oral QPM  . apixaban  2.5 mg Oral BID  . furosemide  40 mg Oral Daily  . irbesartan  150 mg Oral Daily  . levothyroxine  100 mcg Oral Q0600  . melatonin  3 mg Oral QHS  . metoprolol succinate  100 mg Oral q AM  . metoprolol succinate  50 mg Oral QPM  . pantoprazole  40 mg Oral Daily  . potassium chloride  40 mEq Oral Q4H  . sertraline  50 mg Oral q AM  . sodium chloride flush  3 mL Intravenous Q12H  . sodium chloride  1 g Oral TID WC  . temazepam  30 mg Oral QHS   Continuous Infusions: . ampicillin-sulbactam (UNASYN) IV 3 g (07/05/20 9147)          Glade Lloyd, MD [facility] 07/05/2020, 7:51 AM

## 2020-07-05 NOTE — Progress Notes (Signed)
Chart and telemetry reviewed. Ventricular rate controlled with 30s only during sleep, while on metoprolol xL 50 mg. Okay to continbue metoprolol xL 50 mg, unless patient has rate <50 during awake hours.    Elder Negus, MD Pager: [phone] Office: [phone]

## 2020-07-06 ENCOUNTER — Telehealth: Payer: Self-pay | Admitting: Cardiology

## 2020-07-06 DIAGNOSIS — I5033 Acute on chronic diastolic (congestive) heart failure: Secondary | ICD-10-CM | POA: Diagnosis not present

## 2020-07-06 DIAGNOSIS — J9601 Acute respiratory failure with hypoxia: Secondary | ICD-10-CM | POA: Diagnosis not present

## 2020-07-06 DIAGNOSIS — J690 Pneumonitis due to inhalation of food and vomit: Secondary | ICD-10-CM | POA: Diagnosis not present

## 2020-07-06 DIAGNOSIS — I10 Essential (primary) hypertension: Secondary | ICD-10-CM | POA: Diagnosis not present

## 2020-07-06 LAB — BASIC METABOLIC PANEL
Anion gap: 11 (ref 5–15)
BUN: 20 mg/dL (ref 8–23)
CO2: 39 mmol/L — ABNORMAL HIGH (ref 22–32)
Calcium: 8.8 mg/dL — ABNORMAL LOW (ref 8.9–10.3)
Chloride: 85 mmol/L — ABNORMAL LOW (ref 98–111)
Creatinine, Ser: 0.65 mg/dL (ref 0.44–1.00)
GFR, Estimated: >60 mL/min (ref >60)
Glucose, Bld: 107 mg/dL — ABNORMAL HIGH (ref 70–99)
Potassium: 4 mmol/L (ref 3.5–5.1)
Sodium: 135 mmol/L (ref 135–145)

## 2020-07-06 LAB — MAGNESIUM: Magnesium: 2.2 mg/dL (ref 1.7–2.4)

## 2020-07-06 MED ORDER — SODIUM CHLORIDE 1 G PO TABS
1.0000 g | ORAL_TABLET | Freq: Two times a day (BID) | ORAL | Status: DC
  Administered 2020-07-06 (×2): 1 g via ORAL
  Filled 2020-07-06 (×3): qty 1

## 2020-07-06 NOTE — Progress Notes (Signed)
Subjective:  No overnight complaints  Afib w/rate <90s, 40s at night.   Objective:  Vital Signs in the last 24 hours: Temp:  [97.6 F (36.4 C)-98.1 F (36.7 C)] 97.6 F (36.4 C) (11/22 5809) Pulse Rate:  [71-89] 71 (11/22 0632) Resp:  [19-24] 19 (11/22 9833) BP: (104-124)/(60-75) 118/69 (11/22 8250) SpO2:  [90 %-96 %] 94 % (11/22 0824) Weight:  [62 kg] 62 kg (11/22 5397)  Intake/Output from previous day: 11/21 0701 - 11/22 0700 In: 202.1 [P.O.:120; IV Piggyback:82.1] Out: 200 [Urine:200]  Physical Exam Vitals and nursing note reviewed.  Constitutional:      General: She is not in acute distress.    Appearance: She is well-developed.  HENT:     Head: Normocephalic and atraumatic.  Eyes:     Conjunctiva/sclera: Conjunctivae normal.     Pupils: Pupils are equal, round, and reactive to light.  Neck:     Vascular: No JVD.  Cardiovascular:     Rate and Rhythm: Normal rate. Rhythm irregular.     Pulses: Normal pulses and intact distal pulses.     Heart sounds: No murmur heard.   Pulmonary:     Effort: Pulmonary effort is normal.     Breath sounds: Examination of the right-lower field reveals decreased breath sounds. Examination of the left-lower field reveals decreased breath sounds. Decreased breath sounds present. No wheezing or rales.  Abdominal:     General: Bowel sounds are normal.     Palpations: Abdomen is soft.     Tenderness: There is no rebound.  Musculoskeletal:        General: No tenderness. Normal range of motion.     Left lower leg: No edema.  Lymphadenopathy:     Cervical: No cervical adenopathy.  Skin:    General: Skin is warm and dry.  Neurological:     Mental Status: She is alert and oriented to person, place, and time.     Cranial Nerves: No cranial nerve deficit.      Lab Results: BMP Recent Labs    11/17/19 1224 11/17/19 1224 01/14/20 1134 July 08, 2020 2232 07/04/20 0832 07/05/20 0248 07/06/20 0253  NA 129*  --  136   < > 131* 134* 135    K 4.2   < > 4.3   < > 2.9* 3.2* 4.0  CL 87*   < > 92*   < > 79* 82* 85*  CO2 31   < > 31*   < > 39* 44* 39*  GLUCOSE 131*  --  137*   < > 100* 112* 107*  BUN 12  --  22   < > 13 19 20   CREATININE 0.72   < > 0.91   < > 0.53 0.63 0.65  CALCIUM 9.3   < > 9.9   < > 8.7* 8.7* 8.8*  GFRNONAA >60   < > 57*   < > >60 >60 >60  GFRAA >60  --  66  --   --   --   --    < > = values in this interval not displayed.    CBC Recent Labs  Lab 07/05/20 0248  WBC 8.5  RBC 3.88  HGB 11.3*  HCT 34.9*  PLT 171  MCV 89.9  MCH 29.1  MCHC 32.4  RDW 13.3  LYMPHSABS 0.6*  MONOABS 0.9  EOSABS 0.1  BASOSABS 0.0    HEMOGLOBIN A1C Lab Results  Component Value Date   HGBA1C  07/23/2007    5.7 (  NOTE)   The ADA recommends the following therapeutic goals for glycemic   control related to Hgb A1C measurement:   Goal of Therapy:   < 7.0% Hgb A1C   Action Suggested:  > 8.0% Hgb A1C   Ref:  Diabetes Care, 22, Suppl. 1, 1999   MPG 126 07/23/2007     BNP (last 3 results) Recent Labs    11/17/19 1224 07/26/2020 2232  BNP 307.3* 456.1*    TSH Recent Labs    11/17/19 1224 06/28/20 0500  TSH 3.002 1.713    Lipid Panel     Component Value Date/Time   CHOL 175 03/05/2018 1017   CHOL 131 07/30/2015 1053   TRIG 147 03/05/2018 1017   HDL 59 03/05/2018 1017   HDL 59 07/30/2015 1053   CHOLHDL 3.0 03/05/2018 1017   VLDL 24 10/05/2016 0957   LDLCALC 92 03/05/2018 1017     Hepatic Function Panel Recent Labs    2020/07/26 2232 07/03/20 0254  PROT 7.3 5.7*  ALBUMIN 4.1 2.6*  AST 24 21  ALT 24 16  ALKPHOS 115 79  BILITOT 0.6 1.3*   Cardiac studies:  EKG 2020-07-26:  Atrial fibrillation, 84 bpm, normal axis, poor R wave progression, rare premature ventricular contractions, consider old anterior infarct, nonspecific ST-T changes either due to inferolateral ischemia or ST-T changes due to LVH. Compared to prior EKG normal sinus rhythm is transition to atrial fibrillation.  Echocardiogram  06/28/2020: LVEF 60-65%, no regional wall motion abnormalities, diastolic dysfunction could not be evaluated, moderate pulmonary hypertension with RVSP 51 mmHg, severely dilated biatrial enlargement, mild to moderate MR, mild to moderate TR, aortic valve sclerosis without stenosis.  Echocardiogram 12/20/2019:  Normal LV systolic function with visual EF 55-60%. Left ventricle cavity is normal in size. Mild concentric hypertrophy of the left ventricle.  Normal global wall motion. Doppler evidence of grade II (pseudonormal) diastolic dysfunction, elevated LAP. Calculated EF 50%.  Patient was in sinus rhythm during exam.  Left atrial cavity is severely dilated with LA voume index of 70 mL/m2.  Structurally normal mitral valve. Mild (Grade I) mitral regurgitation.  Structurally normal tricuspid valve. Mild tricuspid regurgitation. No evidence of pulmonary hypertension. RVSP measures 30 mmHg.  Lexiscan Tetrofosmin stress test 12/30/2019: No previous exam available for comparison. Lexiscan nuclear stress test performed using 1-day protocol. Stress EKG is non-diagnostic, as this is pharmacological stress test. Stress symptoms included dyspnea & chest pressure 3/10.  All segments of left ventricle demonstrated normal wall motion and thickening. Stress LVEF calculated 42%, although visually appears normal.  Intermediate risk study. Clinical correlation recommended.    Assessment & Recommendations:  86 y/o Caucasian female with hypertension, hypothyroidism, PAF, suspected aspiration pneumonia  PAF: Currently in Afib with ventricular rate in 70s-80s, with overnight rate in 30s after metoprolol xL 100 mg.  This has since been held.  Her hypoxia is due to her aspiration pneumonia. I do not think cardioversion will help with this.  Tolerating metoprolol succinate 50 mg well without extreme tachy or bradycardia events.  Continue management of aspiration pneumonia as per the primary team. CHA2DS2VASc  score 4, annual stroke risk 5%. Continue eliquis 2.5 mg bid.  Will arrange outpatient f/u    Elder Negus, MD Pager: [phone] Office: [phone]

## 2020-07-06 NOTE — Progress Notes (Signed)
Patient ID: Chelsea Marchorma P Couey, female   DOB: 11-Jan-1931, 86 y.o.   MRN: [id number]  PROGRESS NOTE    [name]  [id number] DOB: 11-Jan-1931 DOA: 07/10/2020 PCP: Kermit Baloeed, Tiffany L, DO   Brief Narrative:   86 y.o. femalewith medical history significant forparoxysmal atrial fibrillation on Eliquis, hypertension, hypothyroidism, GERD, and anxiety. Patient presented secondary to dyspnea with concern for heart failure exacerbation. Lasix started.  Lasix was subsequently discontinued due to worsening hyponatremia.  She was also started on Augmentin.  Subsequently cardiology was consulted.   Assessment & Plan:   Acute hypoxic respiratory failure -Slightly improved than yesterday: Currently still on 4 L oxygen via nasal cannula.  Wean off as able.    Possible community-acquired pneumonia/aspiration pneumonia -CT chest showed multifocal opacities.  Augmentin was switched to IV Unasyn on 07/01/2020.  Finish 7-day course of therapy. -Diet as per SLP recommendations.  Acute on chronic diastolic heart failure -Last echo from 5/121 showed grade 1 diastolic dysfunction.  Initially diuresed with Lasix but subsequently discontinued because of worsening hyponatremia. -Strict input and output.  Daily weights.  Fluid restriction. -IV Lasix was resumed on 07/01/2020.  Volume status improving. IV Lasix was switched to oral Lasix 40 mg daily on 07/05/2020.  Paroxysmal A. Fib -Continue Eliquis and metoprolol.  Cardiology following.  Metoprolol dose has been decreased by cardiology.  Hyponatremia -Possibly from volume overload.  Patient on hydrochlorothiazide and sertraline as an outpatient. -Improving.  Sodium 135 today.  Lasix plan as above.  Decrease oral tablets to twice a day.  Hypomagnesemia -Improved. Repeat a.m. labs.  Hypokalemia -Improved.  Hypothyroidism -Continue Synthroid  Hypertension -Continue irbesartan and amlodipine and Lasix.  Hydrochlorothiazide on hold.  Blood pressure  stable.  GERD -Continue Protonix  Anxiety -Continue Zoloft along with Xanax as needed  Generalized deconditioning -will need home health PT    DVT prophylaxis: Eliquis Code Status: DNR Family Communication: Daughter at bedside on 07/03/2020 Disposition Plan: Status is: Inpatient  Remains inpatient appropriate because:Inpatient level of care appropriate due to severity of illness. Possible discharge back to ALF with home health in 1 to 2 days if clinically remained stable. Might need home oxygen on discharge.   Dispo: The patient is from: ILF               Anticipated d/c is to: ILF with HH              Anticipated d/c date is: 1 day              Patient currently is not medically stable to d/c.  Consultants: cardiology  Procedures: None  Antimicrobials:  Anti-infectives (From admission, onward)   Start     Dose/Rate Route Frequency Ordered Stop   07/01/20 1400  Ampicillin-Sulbactam (UNASYN) 3 g in sodium chloride 0.9 % 100 mL IVPB        3 g 200 mL/hr over 30 Minutes Intravenous Every 8 hours 07/01/20 1250     06/30/20 1145  amoxicillin-clavulanate (AUGMENTIN) 875-125 MG per tablet 1 tablet  Status:  Discontinued        1 tablet Oral Every 12 hours 06/30/20 1055 07/01/20 1138       Subjective: Patient seen and examined at bedside. Denies worsening shortness of breath. Poor historian. No chest pain, nausea or vomiting reported. Feels extremely weak. Objective: Vitals:   07/05/20 1529 07/05/20 1719 07/05/20 2029 07/06/20 0632  BP: 110/62 124/60 119/75 118/69  Pulse: 74 89 82 71  Resp: 20  19  19  Temp:   97.6 F (36.4 C) 97.6 F (36.4 C)  TempSrc:   Oral Oral  SpO2: 95%  90% 96%  Weight:    62 kg  Height:        Intake/Output Summary (Last 24 hours) at 07/06/2020 0750 Last data filed at 07/06/2020 0655 Gross per 24 hour  Intake 202.12 ml  Output 200 ml  Net 2.12 ml   Filed Weights   07/04/20 0406 07/05/20 0410 07/06/20 4627  Weight: 61.6 kg 60.6 kg 62  kg    Examination:  General exam: Chronically ill looking elderly female lying in bed. No distress. Still requiring 4 L oxygen via nasal cannula.  Respiratory system: Decreased breath sounds at bases bilaterally with scattered crackles, no wheezing  cardiovascular system: Rate is currently controlled, S1-S2 heard gastrointestinal system: Abdomen is nondistended, soft and nontender. Bowel sounds are heard  extremities: Mild lower extremity edema present. No cyanosis Central nervous system: Poor historian. Does not participate in conversation much. No focal neurological deficits. Moves extremities skin: No obvious ecchymosis/lesions psychiatry: Affect is flat   Data Reviewed: I have personally reviewed following labs and imaging studies  CBC: Recent Labs  Lab 07/01/20 0407 07/02/20 0224 07/03/20 0254 07/04/20 0832 07/05/20 0248  WBC 11.0* 11.3* 11.4* 8.3 8.5  NEUTROABS  --  9.5* 9.3* 6.1 6.9  HGB 12.9 13.7 13.2 12.5 11.3*  HCT 39.0 41.0 39.3 37.9 34.9*  MCV 88.8 88.6 87.1 89.0 89.9  PLT 134* 145* 170 149* 171   Basic Metabolic Panel: Recent Labs  Lab 07/02/20 0224 07/03/20 0254 07/04/20 0832 07/05/20 0248 07/06/20 0253  NA 124* 128* 131* 134* 135  K 3.6 3.7 2.9* 3.2* 4.0  CL 73* 75* 79* 82* 85*  CO2 38* 41* 39* 44* 39*  GLUCOSE 100* 98 100* 112* 107*  BUN 13 15 13 19 20   CREATININE 0.46 0.52 0.53 0.63 0.65  CALCIUM 8.9 8.5* 8.7* 8.7* 8.8*  MG 1.4* 1.8 1.5* 1.6* 2.2   GFR: Estimated Creatinine Clearance: 48.5 mL/min (by C-G formula based on SCr of 0.65 mg/dL). Liver Function Tests: Recent Labs  Lab 07/03/20 0254  AST 21  ALT 16  ALKPHOS 79  BILITOT 1.3*  PROT 5.7*  ALBUMIN 2.6*   No results for input(s): LIPASE, AMYLASE in the last 168 hours. No results for input(s): AMMONIA in the last 168 hours. Coagulation Profile: No results for input(s): INR, PROTIME in the last 168 hours. Cardiac Enzymes: No results for input(s): CKTOTAL, CKMB, CKMBINDEX,  TROPONINI in the last 168 hours. BNP (last 3 results) No results for input(s): PROBNP in the last 8760 hours. HbA1C: No results for input(s): HGBA1C in the last 72 hours. CBG: Recent Labs  Lab 06/29/20 0811 06/29/20 1208  GLUCAP 114* 138*   Lipid Profile: No results for input(s): CHOL, HDL, LDLCALC, TRIG, CHOLHDL, LDLDIRECT in the last 72 hours. Thyroid Function Tests: No results for input(s): TSH, T4TOTAL, FREET4, T3FREE, THYROIDAB in the last 72 hours. Anemia Panel: No results for input(s): VITAMINB12, FOLATE, FERRITIN, TIBC, IRON, RETICCTPCT in the last 72 hours. Sepsis Labs: No results for input(s): PROCALCITON, LATICACIDVEN in the last 168 hours.  Recent Results (from the past 240 hour(s))  Respiratory Panel by RT PCR (Flu A&B, Covid) - Nasopharyngeal Swab     Status: None   Collection Time: 21-Jul-2020 11:36 PM   Specimen: Nasopharyngeal Swab  Result Value Ref Range Status   SARS Coronavirus 2 by RT PCR NEGATIVE NEGATIVE Final    Comment: (  NOTE) SARS-CoV-2 target nucleic acids are NOT DETECTED.  The SARS-CoV-2 RNA is generally detectable in upper respiratoy specimens during the acute phase of infection. The lowest concentration of SARS-CoV-2 viral copies this assay can detect is 131 copies/mL. A negative result does not preclude SARS-Cov-2 infection and should not be used as the sole basis for treatment or other patient management decisions. A negative result may occur with  improper specimen collection/handling, submission of specimen other than nasopharyngeal swab, presence of viral mutation(s) within the areas targeted by this assay, and inadequate number of viral copies (<131 copies/mL). A negative result must be combined with clinical observations, patient history, and epidemiological information. The expected result is Negative.  Fact Sheet for Patients:  https://www.moore.com/  Fact Sheet for Healthcare Providers:   https://www.young.biz/  This test is no t yet approved or cleared by the [place] FDA and  has been authorized for detection and/or diagnosis of SARS-CoV-2 by FDA under an Emergency Use Authorization (EUA). This EUA will remain  in effect (meaning this test can be used) for the duration of the COVID-19 declaration under Section 564(b)(1) of the Act, 21 U.S.C. section 360bbb-3(b)(1), unless the authorization is terminated or revoked sooner.     Influenza A by PCR NEGATIVE NEGATIVE Final   Influenza B by PCR NEGATIVE NEGATIVE Final    Comment: (NOTE) The Xpert Xpress SARS-CoV-2/FLU/RSV assay is intended as an aid in  the diagnosis of influenza from Nasopharyngeal swab specimens and  should not be used as a sole basis for treatment. Nasal washings and  aspirates are unacceptable for Xpert Xpress SARS-CoV-2/FLU/RSV  testing.  Fact Sheet for Patients: https://www.moore.com/  Fact Sheet for Healthcare Providers: https://www.young.biz/  This test is not yet approved or cleared by the [place] FDA and  has been authorized for detection and/or diagnosis of SARS-CoV-2 by  FDA under an Emergency Use Authorization (EUA). This EUA will remain  in effect (meaning this test can be used) for the duration of the  Covid-19 declaration under Section 564(b)(1) of the Act, 21  U.S.C. section 360bbb-3(b)(1), unless the authorization is  terminated or revoked. Performed at [facility], [address], [place], [place] [address]   Respiratory Panel by PCR     Status: None   Collection Time: 06/29/20  3:29 PM   Specimen: Nasopharyngeal Swab; Respiratory  Result Value Ref Range Status   Adenovirus NOT DETECTED NOT DETECTED Final   Coronavirus 229E NOT DETECTED NOT DETECTED Final    Comment: (NOTE) The Coronavirus on the Respiratory Panel, DOES NOT test for the novel  Coronavirus (2019 nCoV)    Coronavirus HKU1 NOT  DETECTED NOT DETECTED Final   Coronavirus NL63 NOT DETECTED NOT DETECTED Final   Coronavirus OC43 NOT DETECTED NOT DETECTED Final   Metapneumovirus NOT DETECTED NOT DETECTED Final   Rhinovirus / Enterovirus NOT DETECTED NOT DETECTED Final   Influenza A NOT DETECTED NOT DETECTED Final   Influenza B NOT DETECTED NOT DETECTED Final   Parainfluenza Virus 1 NOT DETECTED NOT DETECTED Final   Parainfluenza Virus 2 NOT DETECTED NOT DETECTED Final   Parainfluenza Virus 3 NOT DETECTED NOT DETECTED Final   Parainfluenza Virus 4 NOT DETECTED NOT DETECTED Final   Respiratory Syncytial Virus NOT DETECTED NOT DETECTED Final   Bordetella pertussis NOT DETECTED NOT DETECTED Final   Chlamydophila pneumoniae NOT DETECTED NOT DETECTED Final   Mycoplasma pneumoniae NOT DETECTED NOT DETECTED Final    Comment: Performed at [facility], [address], [place], [place] [address]         Radiology Studies: No results found.      Scheduled Meds:  amLODipine  10 mg Oral QPM   apixaban  2.5 mg Oral BID   furosemide  40 mg Oral Daily   irbesartan  150 mg Oral Daily   levothyroxine  100 mcg Oral Q0600   melatonin  3 mg Oral QHS   metoprolol succinate  50 mg Oral QPM   pantoprazole  40 mg Oral Daily   sertraline  50 mg Oral q AM   sodium chloride flush  3 mL Intravenous Q12H   sodium chloride  1 g Oral TID WC   temazepam  30 mg Oral QHS   Continuous Infusions:  ampicillin-sulbactam (UNASYN) IV 3 g (07/06/20 0177)          Glade Lloyd, MD [facility] 07/06/2020, 7:50 AM

## 2020-07-06 NOTE — Progress Notes (Signed)
  Speech Language Pathology Treatment: Dysphagia  Patient Details Name: [name] MRN: [id number] DOB: [birth year] Today's Date: 07/06/2020 Time: 6387-5643 SLP Time Calculation (min) (ACUTE ONLY): 13 min  Assessment / Plan / Recommendation Clinical Impression  Pt was seen for dysphagia treatment. She was alert and cooperative and reported that she has been tolerating the current diet without s/sx of aspiration. She stated that she has been observing the swallowing precautions despite the chin tuck and L head turn being "aggravating". She was seen during part of dinner and tolerated puree solids and nectar thick liquids via straw without s/sx of aspiration. Pt was independent with use of swallowing precautions including postural modifications. She was educated regarding the purpose and demonstration of dysphagia exercises. She verbalized understanding and completed limited reps of the Masako and effortful swallows. Pt reported that she needed to use the bedside commode and the session was abbreviated to allow staff to assist pt with this. SLP will continue to follow pt.    HPI HPI: Pt is an 86 y.o. female with medical history significant for paroxysmal atrial fibrillation on Eliquis, hypertension, hypothyroidism, GERD, and anxiety who presented to the ED for evaluation of shortness of breath and cough.  CXR revealed interval development of bilateral pleural effusions, right greater than left, with associated compressive atelectasis. On date of admission, pt reported that she was taking her afternoon medications (Eliquis, Tylenol, and vitamin supplements) when she choked on the pills, began to have nonproductive coughing fits, and increased SOB. Pt reported to referring MD, that she has "frequent issues with choking on her pills, most notably on her potassium supplement which she takes every morning." CT chest 11/15: Multifocal pulmonary opacities, infection and/or aspiration. Fluid in the right-sided  endobronchial tree, for which aspiration should be excluded clinically. CXR 11/17: Pleural effusions bilaterally with multifocal pneumonia, more on the left. Bedside swallow evaluation pm 1114 was negative for signs of aspiration.       SLP Plan  Continue with current plan of care       Recommendations  Diet recommendations: Dysphagia 3 (mechanical soft);Nectar-thick liquid Liquids provided via: Cup;Straw Medication Administration: Whole meds with puree Supervision: Intermittent supervision to cue for compensatory strategies Compensations: Slow rate;Small sips/bites;Chin tuck (chin tuck with left head turn) Postural Changes and/or Swallow Maneuvers: Seated upright 90 degrees                Oral Care Recommendations: Oral care BID Follow up Recommendations: Home health SLP SLP Visit Diagnosis: Dysphagia, unspecified (R13.10) Plan: Continue with current plan of care       [name] I. Vear Clock, MS, CCC-SLP Acute Rehabilitation Services Office number [phone] Pager [phone]                [name] 07/06/2020, 5:41 PM

## 2020-07-06 NOTE — Care Management Important Message (Signed)
Important Message  Patient Details  Name: [name] MRN: [id number] Date of Birth: 10/22/1930   Medicare Important Message Given:  Yes     Renie Ora 07/06/2020, 11:26 AM

## 2020-07-06 NOTE — Progress Notes (Signed)
Occupational Therapy Treatment Patient Details Name: [name] MRN: [id number] DOB: 11-16-1930 Today's Date: 07/06/2020    History of present illness This 73 admitted with SOB and cough.  Dx:  acute respiratory failrue with hypoxia, acute on chronic diastolic CHF, pleural effusions, hypnatremia, paroxysmal A-Fib.  PMH includes: unspecified tinnitus, hereditary ideopathic peripheral neuropathy, HTN, senile osteoporosis, OA, macular degeneration, diaphragmatic hernia, anxiety    OT comments  Pt performing ADL at sink in standing with minguardA. Pt modified independence for bed mobility; transfers and ADL functional mobility with minguardA for RW management to avoid obstacles. Pt tolerating session well with  O2 sats 90% on 3L; 87- 93%  2L O2 with exertion. Pt would greatly benefit from continued OT skilled services for ADL, mobility and safety in [facility] setting. OT following acutely.     Follow Up Recommendations  Home health OT;Supervision - Intermittent    Equipment Recommendations  None recommended by OT    Recommendations for Other Services      Precautions / Restrictions Precautions Precautions: Fall;Other (comment) Precaution Comments: 2-3 L O2 continuous Restrictions Weight Bearing Restrictions: No       Mobility Bed Mobility Overal bed mobility: Modified Independent Bed Mobility: Supine to Sit     Supine to sit: Modified independent (Device/Increase time)     General bed mobility comments: no physical assist  Transfers Overall transfer level: Needs assistance Equipment used: Rolling walker (2 wheeled) Transfers: Sit to/from [employer] Sit to Stand: Min guard Stand pivot transfers: Min guard            Balance Overall balance assessment: Mild deficits observed, not formally tested   Sitting balance-[name]: Good       Standing balance-[name]: Fair Standing balance comment: using RW for stability; single UE performing  functional tasks                           ADL either performed or assessed with clinical judgement   ADL Overall ADL's : Needs assistance/impaired     Grooming: Supervision/safety;Standing Grooming Details (indicate cue type and reason): standing at sink                 Toilet Transfer: Min guard;Grab bars;RW;Ambulation Statistician Details (indicate cue type and reason): cues to avoid obstacles Toileting- Clothing Manipulation and Hygiene: Sit to/from stand;Min guard Toileting - Clothing Manipulation Details (indicate cue type and reason): cues for location of items needed.     Functional mobility during ADLs: Min guard;Rolling walker General ADL Comments: Assist for maneuvering around obstacles     Vision   Vision Assessment?: No apparent visual deficits   Perception     Praxis      Cognition Arousal/Alertness: Awake/alert Behavior During Therapy: WFL for tasks assessed/performed Overall Cognitive Status: Within Functional Limits for tasks assessed                                          Exercises     Shoulder Instructions       General Comments O2 >90% 2-3L throughout.     Pertinent Vitals/ Pain       Pain Assessment: Faces Faces Pain Scale: No hurt Pain Intervention(s): Monitored during session  Home Living  Prior Functioning/Environment              Frequency  Min 2X/week        Progress Toward Goals  OT Goals(current goals can now be found in the care plan section)  Progress towards OT goals: Progressing toward goals  Acute Rehab OT Goals Patient Stated Goal: Hoping to return to her ALF OT Goal Formulation: With patient Time For Goal Achievement: 07/12/20 Potential to Achieve Goals: Good ADL Goals Pt Will Perform Grooming: with supervision;standing Pt Will Perform Upper Body Bathing: with supervision;sitting Pt Will Perform Lower Body  Bathing: with supervision;sit to/from stand Pt Will Perform Upper Body Dressing: with supervision;sitting Pt Will Perform Lower Body Dressing: with supervision;sit to/from stand Pt Will Transfer to Toilet: with supervision;ambulating;regular height toilet Pt Will Perform Toileting - Clothing Manipulation and hygiene: with supervision  Plan Discharge plan remains appropriate    Co-evaluation                 AM-PAC OT "6 Clicks" Daily Activity     Outcome Measure   Help from another person eating meals?: A Little Help from another person taking care of personal grooming?: A Little Help from another person toileting, which includes using toliet, bedpan, or urinal?: A Little Help from another person bathing (including washing, rinsing, drying)?: A Little Help from another person to put on and taking off regular upper body clothing?: None Help from another person to put on and taking off regular lower body clothing?: A Little 6 Click Score: 19    End of Session Equipment Utilized During Treatment: Rolling walker;Oxygen  OT Visit Diagnosis: Low vision, both eyes (H54.2)   Activity Tolerance Patient tolerated treatment well   Patient Left Other (comment) (on commode; NT aware)   Nurse Communication Mobility status        Time: 1000-1021 OT Time Calculation (min): 21 min  Charges: OT General Charges $OT Visit: 1 Visit OT Treatments $Self Care/Home Management : 8-22 mins  Flora Lipps, OTR/L Acute Rehabilitation Services Pager: [phone] Office: [phone]      Elisavet Buehrer C 07/06/2020, 1:25 PM

## 2020-07-06 NOTE — Progress Notes (Signed)
Physical Therapy Treatment Patient Details Name: [name] MRN: [id number] DOB: 01-Mar-1931 Today's Date: 07/06/2020    History of Present Illness This 46 admitted with SOB and cough.  Dx:  acute respiratory failrue with hypoxia, acute on chronic diastolic CHF, pleural effusions, hypnatremia, paroxysmal A-Fib.  PMH includes: unspecified tinnitus, hereditary ideopathic peripheral neuropathy, HTN, senile osteoporosis, OA, macular degeneration, diaphragmatic hernia, anxiety     PT Comments    Pt reports fatigue after amb with OT earlier. Performed transfers with rolling walker. Still requiring supplemental O2.    Follow Up Recommendations  Home health PT;Supervision/Assistance - 24 hour     Equipment Recommendations  None recommended by PT    Recommendations for Other Services       Precautions / Restrictions Precautions Precautions: Fall;Other (comment) Precaution Comments: watch SpO2 Restrictions Weight Bearing Restrictions: No    Mobility  Bed Mobility Overal bed mobility: Needs Assistance Bed Mobility: Sit to Supine     Supine to sit: Modified independent (Device/Increase time) Sit to supine: Min assist   General bed mobility comments: Assist to bring legs back up into bed  Transfers Overall transfer level: Needs assistance Equipment used: Rolling walker (2 wheeled) Transfers: Sit to/from [employer] Sit to Stand: Min guard Stand pivot transfers: Min guard       General transfer comment: Assist for safety for chair to bsc to bed with rolling walker.   Ambulation/Gait             General Gait Details: Pt refused due to fatigue   Stairs             Wheelchair Mobility    Modified Rankin (Stroke Patients Only)       Balance Overall balance assessment: Needs assistance Sitting-balance support: No upper extremity supported;Feet supported Sitting balance-[name]: Good     Standing balance support: Single extremity  supported Standing balance-[name]: Fair Standing balance comment: Light UE support for reassurance                            Cognition Arousal/Alertness: Awake/alert Behavior During Therapy: WFL for tasks assessed/performed Overall Cognitive Status: Within Functional Limits for tasks assessed                                        Exercises      General Comments General comments (skin integrity, edema, etc.): SpO2 85-86% on 2L. Incr O2 to 3L with SpO2 up to 90% at rest      Pertinent Vitals/Pain Pain Assessment: Faces Faces Pain Scale: No hurt Pain Intervention(s): Monitored during session    Home Living                      Prior Function            PT Goals (current goals can now be found in the care plan section) Acute Rehab PT Goals Patient Stated Goal: Hoping to return to her ALF Progress towards PT goals: Not progressing toward goals - comment    Frequency    Min 3X/week      PT Plan Current plan remains appropriate    Co-evaluation              AM-PAC PT "6 Clicks" Mobility   Outcome Measure  Help needed turning from your back to your  side while in a flat bed without using bedrails?: None Help needed moving from lying on your back to sitting on the side of a flat bed without using bedrails?: None Help needed moving to and from a bed to a chair (including a wheelchair)?: A Little Help needed standing up from a chair using your arms (e.g., wheelchair or bedside chair)?: A Little Help needed to walk in hospital room?: A Little Help needed climbing 3-5 steps with a railing? : A Little 6 Click Score: 20    End of Session Equipment Utilized During Treatment: Oxygen Activity Tolerance: Patient limited by fatigue Patient left: in bed;with call bell/phone within reach;with bed alarm set;with nursing/sitter in room Nurse Communication: Mobility status PT Visit Diagnosis: Unsteadiness on feet (R26.81);Muscle  weakness (generalized) (M62.81);Other abnormalities of gait and mobility (R26.89)     Time: 4982-6415 PT Time Calculation (min) (ACUTE ONLY): 21 min  Charges:  $Therapeutic Activity: 8-22 mins                     [facility] PT Acute Rehabilitation Services Pager [phone] Office [phone]    Angelina Ok [facility] 07/06/2020, 2:13 PM

## 2020-07-06 NOTE — Plan of Care (Signed)

## 2020-07-07 ENCOUNTER — Inpatient Hospital Stay (HOSPITAL_COMMUNITY): Payer: Medicare Other

## 2020-07-07 DIAGNOSIS — J690 Pneumonitis due to inhalation of food and vomit: Secondary | ICD-10-CM | POA: Diagnosis not present

## 2020-07-07 DIAGNOSIS — I5033 Acute on chronic diastolic (congestive) heart failure: Secondary | ICD-10-CM | POA: Diagnosis not present

## 2020-07-07 DIAGNOSIS — I10 Essential (primary) hypertension: Secondary | ICD-10-CM | POA: Diagnosis not present

## 2020-07-07 DIAGNOSIS — J9601 Acute respiratory failure with hypoxia: Secondary | ICD-10-CM | POA: Diagnosis not present

## 2020-07-07 LAB — BASIC METABOLIC PANEL
Anion gap: 9 (ref 5–15)
BUN: 22 mg/dL (ref 8–23)
CO2: 41 mmol/L — ABNORMAL HIGH (ref 22–32)
Calcium: 9.2 mg/dL (ref 8.9–10.3)
Chloride: 85 mmol/L — ABNORMAL LOW (ref 98–111)
Creatinine, Ser: 0.53 mg/dL (ref 0.44–1.00)
GFR, Estimated: >60 mL/min (ref >60)
Glucose, Bld: 130 mg/dL — ABNORMAL HIGH (ref 70–99)
Potassium: 4.5 mmol/L (ref 3.5–5.1)
Sodium: 135 mmol/L (ref 135–145)

## 2020-07-07 LAB — MAGNESIUM: Magnesium: 2.1 mg/dL (ref 1.7–2.4)

## 2020-07-07 LAB — SARS CORONAVIRUS 2 BY RT PCR (HOSPITAL ORDER, PERFORMED IN ~~LOC~~ HOSPITAL LAB): SARS Coronavirus 2: NEGATIVE

## 2020-07-07 MED ORDER — SODIUM CHLORIDE 1 G PO TABS: 1.0000 g | ORAL_TABLET | Freq: Every day | ORAL | Status: DC

## 2020-07-07 MED ORDER — SODIUM CHLORIDE 1 G PO TABS
1.0000 g | ORAL_TABLET | Freq: Every day | ORAL | Status: DC
  Administered 2020-07-07 – 2020-07-10 (×4): 1 g via ORAL
  Filled 2020-07-07 (×5): qty 1

## 2020-07-07 MED ORDER — FUROSEMIDE 20 MG PO TABS
20.0000 mg | ORAL_TABLET | Freq: Every day | ORAL | Status: DC
  Administered 2020-07-07 – 2020-07-09 (×3): 20 mg via ORAL
  Filled 2020-07-07 (×3): qty 1

## 2020-07-07 NOTE — Progress Notes (Addendum)
Physical Therapy Treatment Patient Details Name: [name] MRN: [id number] DOB: 09-22-1930 Today's Date: 07/07/2020    History of Present Illness This 14 admitted with SOB and cough.  Dx:  acute respiratory failrue with hypoxia, acute on chronic diastolic CHF, pleural effusions, hypnatremia, paroxysmal A-Fib. Pt may also have PNA.  PMH includes: unspecified tinnitus, hereditary ideopathic peripheral neuropathy, HTN, senile osteoporosis, OA, macular degeneration, diaphragmatic hernia, anxiety     PT Comments    Pt continues to become weaker and is able to tolerate little activity and is doing very little for herself. Pt and elderly husband live in independent apt ([place]) not ALF. I do not think patient can manage at home at this level with just her husband. I am updating dc recommendation to SNF.    Follow Up Recommendations  SNF     Equipment Recommendations  Wheelchair (measurements PT)    Recommendations for Other Services       Precautions / Restrictions Precautions Precautions: Fall;Other (comment) Precaution Comments: watch SpO2. poor vision    Mobility  Bed Mobility Overal bed mobility: Needs Assistance Bed Mobility: Supine to Sit     Supine to sit: Mod assist;HOB elevated     General bed mobility comments: Assist to elevate trunk into sitting  Transfers Overall transfer level: Needs assistance Equipment used: Rolling walker (2 wheeled) Transfers: Sit to/from Stand Sit to Stand: Min assist         General transfer comment: Assist to bring hips up and for balance  Ambulation/Gait Ambulation/Gait assistance: Min assist Gait Distance (Feet): 120 Feet Assistive device: 4-wheeled walker Gait Pattern/deviations: Step-through pattern;Decreased step length - right;Decreased step length - left;Shuffle;Trunk flexed Gait velocity: decr Gait velocity interpretation: <1.31 ft/sec, indicative of household ambulator General Gait Details: Assist for balance and  support. Verbal cues to stand more erect.    Stairs             Wheelchair Mobility    Modified Rankin (Stroke Patients Only)       Balance Overall balance assessment: Needs assistance Sitting-balance support: No upper extremity supported;Feet supported Sitting balance-[name]: Fair     Standing balance support: Bilateral upper extremity supported Standing balance-[name]: Poor Standing balance comment: rollator and min guard for static standing                            Cognition Arousal/Alertness: Awake/alert Behavior During Therapy: WFL for tasks assessed/performed Overall Cognitive Status: Within Functional Limits for tasks assessed                                        Exercises      General Comments General comments (skin integrity, edema, etc.): SpO2 97% on 5L at rest, 89% on 4L with amb      Pertinent Vitals/Pain Pain Assessment: No/denies pain    Home Living                      Prior Function            PT Goals (current goals can now be found in the care plan section) Acute Rehab PT Goals Patient Stated Goal: Hoping to return home Progress towards PT goals: Not progressing toward goals - comment    Frequency    Min 3X/week      PT Plan Discharge plan  needs to be updated    Co-evaluation              AM-PAC PT "6 Clicks" Mobility   Outcome Measure  Help needed turning from your back to your side while in a flat bed without using bedrails?: A Little Help needed moving from lying on your back to sitting on the side of a flat bed without using bedrails?: A Lot Help needed moving to and from a bed to a chair (including a wheelchair)?: A Little Help needed standing up from a chair using your arms (e.g., wheelchair or bedside chair)?: A Little Help needed to walk in hospital room?: A Little Help needed climbing 3-5 steps with a railing? : Total 6 Click Score: 15    End of Session  Equipment Utilized During Treatment: Oxygen;Gait belt Activity Tolerance: Patient limited by fatigue Patient left: with call bell/phone within reach;in chair;with chair alarm set Nurse Communication: Mobility status PT Visit Diagnosis: Unsteadiness on feet (R26.81);Muscle weakness (generalized) (M62.81);Other abnormalities of gait and mobility (R26.89)     Time: 9574-7340 PT Time Calculation (min) (ACUTE ONLY): 35 min  Charges:  $Gait Training: 23-37 mins                     [facility] PT Acute Rehabilitation Services Pager [phone] Office [phone]    Angelina Ok [facility] 07/07/2020, 2:09 PM

## 2020-07-07 NOTE — Progress Notes (Signed)
Pt. O2 dropped to 82% raised O2 from 5L to 7L. to increases O2. Pt. now at 6L. O2 sat. Maintaining at 96%. Will continue to monitor.

## 2020-07-07 NOTE — TOC Initial Note (Signed)
Transition of Care [facility]) - Initial/Assessment Note    Patient Details  Name: [name] MRN: [id number] Date of Birth: [birth year]  Transition [facility]) CM/SW Contact:    Terrial Rhodes, LCSWA Phone Number: 07/07/2020, 2:37 PM  Clinical Narrative:                  CSW received consult for possible SNF placement at time of discharge. CSW spoke with patient regarding PT recommendation of SNF placement at time of discharge. Patient reported that patient's spouse is currently unable to care for patient at their home given patient's current physical needs and fall risk. Patient expressed understanding of PT recommendation and is agreeable to SNF placement at time of discharge. Patient gave CSW permission to discuss her care with her daughter [name].Patient gave CSW permission to fax out initial referral near [place] area. Patient has had both COVID vaccines as well as booster vaccine Patient expressed being hopeful for rehab and to feel better soon. No further questions reported at this time. CSW to continue to follow and assist with discharge planning needs.  Expected Discharge Plan: Skilled Nursing Facility Barriers to Discharge: Continued Medical Work up   Patient Goals and CMS Choice Patient states their goals for this hospitalization and ongoing recovery are:: to go to SNF CMS Medicare.gov Compare Post Acute Care list provided to:: Patient Choice offered to / list presented to : Patient  Expected Discharge Plan and Services Expected Discharge Plan: Skilled Nursing Facility   Discharge Planning Services: CM Consult Post Acute Care Choice: Home Health Living arrangements for the past 2 months: Single Family Home                 DME Arranged: N/A DME Agency: NA       HH Arranged: RN, PT, OT HH Agency: [facility] Date [facility] Agency Contacted: 07/01/20 Time HH Agency Contacted: 1601 Representative spoke with at [facility] Agency: Lorenza Chick  Prior Living  Arrangements/Services Living arrangements for the past 2 months: Single Family Home Lives with:: Self, Spouse Patient language and need for interpreter reviewed:: Yes Do you feel safe going back to the place where you live?: No   SNF  Need for Family Participation in Patient Care: Yes (Comment) Care giver support system in place?: Yes (comment)   Criminal Activity/Legal Involvement Pertinent to Current Situation/Hospitalization: No - Comment as needed  Activities of Daily Living Home Assistive Devices/Equipment: Eyeglasses, Walker (specify type) ADL Screening (condition at time of admission) Patient's cognitive ability adequate to safely complete daily activities?: Yes Is the patient deaf or have difficulty hearing?: No Does the patient have difficulty seeing, even when wearing glasses/contacts?: Yes Does the patient have difficulty concentrating, remembering, or making decisions?: No Patient able to express need for assistance with ADLs?: Yes Does the patient have difficulty dressing or bathing?: No Independently performs ADLs?: Yes (appropriate for developmental age) Does the patient have difficulty walking or climbing stairs?: Yes Weakness of Legs: Both Weakness of Arms/Hands: None  Permission Sought/Granted Permission sought to share information with : Case Manager, Family Supports, Magazine features editor Permission granted to share information with : Yes, Verbal Permission Granted  Share Information with NAME: [name]  Permission granted to share info w AGENCY: SNF  Permission granted to share info w Relationship: daughter  Permission granted to share info w Contact Information: [name] [phone]  Emotional Assessment   Attitude/Demeanor/Rapport: Gracious Affect (typically observed): Calm Orientation: : Oriented to Self, Oriented to Place, Oriented to  Time, Oriented to Situation Alcohol / Substance Use: Not Applicable Psych Involvement: No (comment)  Admission  diagnosis:  Acute respiratory failure with hypoxia and hypercapnia (HCC) [J96.01, J96.02] Aspiration pneumonia of both lower lobes, unspecified aspiration pneumonia type [facility]) [J69.0] Patient Active Problem List   Diagnosis Date Noted  . Aspiration pneumonia of both lower lobes (HCC)   . Dyspnea   . Acute respiratory failure with hypoxia and hypercapnia (HCC) 2020/07/20  . Acute on chronic diastolic CHF (congestive heart failure) (HCC) July 20, 2020  . Pleural effusion, right 2020/07/20  . Hyponatremia 2020/07/20  . PAF (paroxysmal atrial fibrillation) (HCC)   . Exudative age-related macular degeneration of left eye with active choroidal neovascularization (HCC) 12/11/2019  . Advanced nonexudative age-related macular degeneration of left eye with subfoveal involvement 12/11/2019  . Exudative age-related macular degeneration of right eye with inactive choroidal neovascularization (HCC) 12/11/2019  . Advanced nonexudative age-related macular degeneration of right eye without subfoveal involvement 12/11/2019  . Exudative age-related macular degeneration of right eye with active choroidal neovascularization (HCC) 12/11/2019  . Generalized OA 09/02/2019  . Osteoporosis 06/20/2017  . 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 Iron deficiency anemia secondary to inadequate dietary iron intake 02/10/2017  . Facial asymmetry 11/09/2016  . Incontinence 09/22/2016  . Hypoxia 08/02/2016  . Hypokalemia 08/02/2016  . Lumbar spondylolysis 04/13/2016  . Bilateral knee pain 12/08/2015  . Abnormality of gait 08/11/2015  . Edema 08/11/2015  . Bunion 08/11/2015  . Anxiety state 03/10/2015  . Palpitations 02/18/2015  . GERD (gastroesophageal reflux disease) 11/05/2014  . Hypothyroidism, adult   . Hyperlipidemia   . Essential hypertension   . Macular degeneration (senile) of retina   . Diaphragmatic hernia   . Insomnia   . Hereditary and idiopathic peripheral neuropathy   . Low back pain    PCP:  Kermit Balo, DO Pharmacy:   [facility] [phone] - [place], [place] - [address] AT [address] OF [facility] [address] [place] [address] Phone: [phone] Fax: [phone]     Social Determinants of Health (SDOH) Interventions    Readmission Risk Interventions No flowsheet data found.

## 2020-07-07 NOTE — Progress Notes (Signed)
RE: [name]  Date of Birth: March 11, 1931  Date: 07/07/2020  To Whom It May Concern:  Please be advised that the above-named patient will require a short-term nursing home stay - anticipated 30 days or less for rehabilitation and strengthening. The plan is for return home.

## 2020-07-07 NOTE — NC FL2 (Signed)
[place] MEDICAID FL2 LEVEL OF CARE SCREENING TOOL     IDENTIFICATION  Patient Name: [name] Birthdate: October 21, 1930 Sex: female Admission Date (Current Location): July 04, 2020  [facility] and [place] Number:  Producer, television/film/video and Address:  [facility] [place], [address] [place], [place] [address]      Provider Number: [id number]  Attending Physician Name and Address:  Glade Lloyd, MD  Relative Name and Phone Number:  [name] [phone]    Current Level of Care: Hospital Recommended Level of Care: Skilled Nursing Facility Prior Approval Number:    Date Approved/Denied:   PASRR Number: PASRR under review  Discharge Plan: SNF    Current Diagnoses: Patient Active Problem List   Diagnosis Date Noted  . Aspiration pneumonia of both lower lobes (HCC)   . Dyspnea   . Acute respiratory failure with hypoxia and hypercapnia (HCC) 07-04-2020  . Acute on chronic diastolic CHF (congestive heart failure) (HCC) 07-04-20  . Pleural effusion, right 04-Jul-2020  . Hyponatremia 07-04-2020  . PAF (paroxysmal atrial fibrillation) (HCC)   . Exudative age-related macular degeneration of left eye with active choroidal neovascularization (HCC) 12/11/2019  . Advanced nonexudative age-related macular degeneration of left eye with subfoveal involvement 12/11/2019  . Exudative age-related macular degeneration of right eye with inactive choroidal neovascularization (HCC) 12/11/2019  . Advanced nonexudative age-related macular degeneration of right eye without subfoveal involvement 12/11/2019  . Exudative age-related macular degeneration of right eye with active choroidal neovascularization (HCC) 12/11/2019  . Generalized OA 09/02/2019  . Osteoporosis 06/20/2017  . 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 Iron deficiency anemia secondary to inadequate dietary iron intake 02/10/2017  . Facial asymmetry 11/09/2016  . Incontinence 09/22/2016  . Hypoxia 08/02/2016  .  Hypokalemia 08/02/2016  . Lumbar spondylolysis 04/13/2016  . Bilateral knee pain 12/08/2015  . Abnormality of gait 08/11/2015  . Edema 08/11/2015  . Bunion 08/11/2015  . Anxiety state 03/10/2015  . Palpitations 02/18/2015  . GERD (gastroesophageal reflux disease) 11/05/2014  . Hypothyroidism, adult   . Hyperlipidemia   . Essential hypertension   . Macular degeneration (senile) of retina   . Diaphragmatic hernia   . Insomnia   . Hereditary and idiopathic peripheral neuropathy   . Low back pain     Orientation RESPIRATION BLADDER Height & Weight     Self, Time, Situation, Place  O2 (6 liters nasal cannula) Continent, External catheter (External Urinary Catheter) Weight: 138 lb 8 oz (62.8 kg) Height:  5\' 8"  (172.7 cm)  BEHAVIORAL SYMPTOMS/MOOD NEUROLOGICAL BOWEL NUTRITION STATUS      Continent Diet (See Discharge Summary)  AMBULATORY STATUS COMMUNICATION OF NEEDS Skin   Limited Assist Verbally Skin abrasions (Abrasion ankle Right Foam)                       Personal Care Assistance Level of Assistance  Bathing, Feeding, Dressing Bathing Assistance: Limited assistance Feeding assistance: Independent (able to feed self) Dressing Assistance: Limited assistance     Functional Limitations Info  Sight, Hearing, Speech Sight Info: Impaired Hearing Info: Adequate Speech Info: Adequate    SPECIAL CARE FACTORS FREQUENCY  PT (By licensed PT), OT (By licensed OT)     PT Frequency: 5x min weekly OT Frequency: 5x min weekly            Contractures Contractures Info: Not present    Additional Factors Info  Code Status, Allergies, Psychotropic Code Status Info: DNR Allergies Info: Crab diagnostic,Shellfish-derived Products,Ambien zolpidem  Tartrate,Bactrim sulfamethoxazole-trimethoprim,Morphine And Related,Zithromax azithromycin Psychotropic Info: sertraline (ZOLOFT) tablet 50 mg every morning,         Current Medications (07/07/2020):  This is the current hospital  active medication list Current Facility-Administered Medications  Medication Dose Route Frequency Provider Last Rate Last Admin  . acetaminophen (TYLENOL) tablet 650 mg  650 mg Oral Q6H PRN Charlsie Quest, MD   650 mg at 07/07/20 1610   Or  . acetaminophen (TYLENOL) suppository 650 mg  650 mg Rectal Q6H PRN Charlsie Quest, MD      . ALPRAZolam Prudy Feeler) tablet 0.5 mg  0.5 mg Oral TID PRN Darreld Mclean R, MD   0.5 mg at 07/06/20 2149  . amLODipine (NORVASC) tablet 10 mg  10 mg Oral QPM Charlsie Quest, MD   10 mg at 07/06/20 1809  . Ampicillin-Sulbactam (UNASYN) 3 g in sodium chloride 0.9 % 100 mL IVPB  3 g Intravenous Q8H Alekh, Kshitiz, MD 200 mL/hr at 07/07/20 1411 3 g at 07/07/20 1411  . apixaban (ELIQUIS) tablet 2.5 mg  2.5 mg Oral BID Darreld Mclean R, MD   2.5 mg at 07/07/20 0910  . furosemide (LASIX) tablet 20 mg  20 mg Oral Daily Alekh, Kshitiz, MD   20 mg at 07/07/20 0910  . hydrocortisone (ANUSOL-HC) 2.5 % rectal cream 1 application  1 application Topical QID PRN Narda Bonds, MD      . irbesartan (AVAPRO) tablet 150 mg  150 mg Oral Daily Darreld Mclean R, MD   150 mg at 07/07/20 0910  . levothyroxine (SYNTHROID) tablet 100 mcg  100 mcg Oral Q0600 Charlsie Quest, MD   100 mcg at 07/07/20 0603  . melatonin tablet 3 mg  3 mg Oral QHS Charlsie Quest, MD   3 mg at 07/06/20 2149  . metoprolol succinate (TOPROL-XL) 24 hr tablet 50 mg  50 mg Oral QPM Tolia, Sunit, DO   50 mg at 07/06/20 1808  . ondansetron (ZOFRAN) tablet 4 mg  4 mg Oral Q6H PRN Charlsie Quest, MD       Or  . ondansetron (ZOFRAN) injection 4 mg  4 mg Intravenous Q6H PRN Darreld Mclean R, MD      . pantoprazole (PROTONIX) EC tablet 40 mg  40 mg Oral Daily Charlsie Quest, MD   40 mg at 07/07/20 0910  . polyethylene glycol (MIRALAX / GLYCOLAX) packet 17 g  17 g Oral Daily PRN Charlsie Quest, MD   17 g at 07/07/20 1024  . Resource ThickenUp Clear   Oral PRN Glade Lloyd, MD      . sertraline (ZOLOFT) tablet 50 mg  50 mg  Oral q AM Narda Bonds, MD   50 mg at 07/07/20 0603  . sodium chloride flush (NS) 0.9 % injection 3 mL  3 mL Intravenous Q12H Darreld Mclean R, MD   3 mL at 07/07/20 0912  . sodium chloride tablet 1 g  1 g Oral Daily Glade Lloyd, MD   1 g at 07/07/20 0910  . temazepam (RESTORIL) capsule 30 mg  30 mg Oral QHS Narda Bonds, MD   30 mg at 07/06/20 2149     Discharge Medications: Please see discharge summary for a list of discharge medications.  Relevant Imaging Results:  Relevant Lab Results:   Additional Information [id number]  Terrial Rhodes, LCSWA

## 2020-07-07 NOTE — Plan of Care (Signed)

## 2020-07-07 NOTE — Progress Notes (Signed)
  Speech Language Pathology Treatment: Dysphagia  Patient Details Name: [name] MRN: [id number] DOB: [birth year] Today's Date: 07/07/2020 Time: 9767-3419 SLP Time Calculation (min) (ACUTE ONLY): 16 min  Assessment / Plan / Recommendation Clinical Impression  Pt was received in bed having just been transferred back from bedside commode.  O2 sats were in the mid 80s following transfer but rebounded to low 90s quickly.  Pt independently verbalized and utilized her swallowing precautions when consuming nectar thick liquids via straw and trials of small amounts of thin liquids via straw.  Pt had x1 initial cough with thin liquids but no other overt s/s of aspiration with additional trials of thins or with nectar thick liquids.  Pt does endorse that she feels nectar thick liquids "go down smoother" at this time so I recommend that pt remain on her currently prescribed diet with ongoing trials of advanced consistencies with SLP.  Pt was left in bed with bed alarm set and call bell within reach.  Continue per current plan of care.   HPI HPI: Pt is an 86 y.o. female with medical history significant for paroxysmal atrial fibrillation on Eliquis, hypertension, hypothyroidism, GERD, and anxiety who presented to the ED for evaluation of shortness of breath and cough.  CXR revealed interval development of bilateral pleural effusions, right greater than left, with associated compressive atelectasis. On date of admission, pt reported that she was taking her afternoon medications (Eliquis, Tylenol, and vitamin supplements) when she choked on the pills, began to have nonproductive coughing fits, and increased SOB. Pt reported to referring MD, that she has "frequent issues with choking on her pills, most notably on her potassium supplement which she takes every morning." CT chest 11/15: Multifocal pulmonary opacities, infection and/or aspiration. Fluid in the right-sided endobronchial tree, for which aspiration should  be excluded clinically. CXR 11/17: Pleural effusions bilaterally with multifocal pneumonia, more on the left. Bedside swallow evaluation pm 1114 was negative for signs of aspiration.       SLP Plan  Continue with current plan of care       Recommendations  Diet recommendations: Dysphagia 3 (mechanical soft);Nectar-thick liquid Liquids provided via: Cup;Straw Medication Administration: Whole meds with puree Supervision: Intermittent supervision to cue for compensatory strategies Compensations: Slow rate;Small sips/bites;Chin tuck Postural Changes and/or Swallow Maneuvers: Seated upright 90 degrees                Oral Care Recommendations: Oral care BID Follow up Recommendations: Home health SLP SLP Visit Diagnosis: Dysphagia, unspecified (R13.10) Plan: Continue with current plan of care       GO                Ishmail Mcmanamon, Melanee Spry 07/07/2020, 10:15 AM

## 2020-07-07 NOTE — Progress Notes (Signed)
Patient ID: [name], female   DOB: 04/22/1931, 86 y.o.   MRN: [id number]  PROGRESS NOTE    [name]  DJM:[id number] DOB: May 15, 1931 DOA: 2020/07/25 PCP: Kermit Balo, DO   Brief Narrative:   86 y.o. femalewith medical history significant forparoxysmal atrial fibrillation on Eliquis, hypertension, hypothyroidism, GERD, and anxiety. Patient presented secondary to dyspnea with concern for heart failure exacerbation. Lasix started.  Lasix was subsequently discontinued due to worsening hyponatremia.  She was also started on Augmentin.  Subsequently cardiology was consulted.   Assessment & Plan:   Acute hypoxic respiratory failure -Slightly improved than yesterday: Currently still on 4-5 L oxygen via nasal cannula.  Wean off as able.    Possible community-acquired pneumonia/aspiration pneumonia -CT chest showed multifocal opacities.  Augmentin was switched to IV Unasyn on 07/01/2020.  Finish 7-day course of therapy.  Today is day #7 of Unasyn. -Diet as per SLP recommendations.  Acute on chronic diastolic heart failure -Last echo from 5/121 showed grade 1 diastolic dysfunction.  Initially diuresed with Lasix but subsequently discontinued because of worsening hyponatremia. -Strict input and output.  Daily weights.  Fluid restriction. -IV Lasix was resumed on 07/01/2020.  Volume status improving. IV Lasix was switched to oral Lasix 40 mg daily on 07/05/2020.  Decrease Lasix to 20 mg daily.  Paroxysmal A. Fib -Continue Eliquis and metoprolol.  Cardiology following.  Metoprolol dose has been decreased by cardiology.  Outpatient follow-up with cardiology  Hyponatremia -Possibly from volume overload.  Patient on hydrochlorothiazide and sertraline as an outpatient. -Improving.  Sodium 135 today.  Lasix plan as above.  Decrease oral tablets to once a day.  Hypomagnesemia -Improved. Repeat a.m. labs.  Hypokalemia -Improved.  Hypothyroidism -Continue  Synthroid  Hypertension -Continue irbesartan and amlodipine and Lasix.  Hydrochlorothiazide on hold.  Blood pressure stable.  GERD -Continue Protonix  Anxiety -Continue Zoloft along with Xanax as needed  Generalized deconditioning -will need home health PT    DVT prophylaxis: Eliquis Code Status: DNR Family Communication: Daughter/Susan on phone on 07/07/2020 Disposition Plan: Status is: Inpatient  Remains inpatient appropriate because:Inpatient level of care appropriate due to severity of illness. Possible discharge back to ALF with home health in 1 to 2 days if clinically remained stable. Might need home oxygen on discharge.   Dispo: The patient is from: ILF               Anticipated d/c is to: ILF with HH              Anticipated d/c date is: 1 day              Patient currently is not medically stable to d/c.  Consultants: cardiology  Procedures: None  Antimicrobials:  Anti-infectives (From admission, onward)   Start     Dose/Rate Route Frequency Ordered Stop   07/01/20 1400  Ampicillin-Sulbactam (UNASYN) 3 g in sodium chloride 0.9 % 100 mL IVPB        3 g 200 mL/hr over 30 Minutes Intravenous Every 8 hours 07/01/20 1250     06/30/20 1145  amoxicillin-clavulanate (AUGMENTIN) 875-125 MG per tablet 1 tablet  Status:  Discontinued        1 tablet Oral Every 12 hours 06/30/20 1055 07/01/20 1138       Subjective: Patient seen and examined at bedside.  No overnight fever, vomiting or chest pain reported.  Feels slightly better but still feels weak.  Poor historian. Objective: Vitals:   07/06/20 1808 07/06/20  2051 07/07/20 0000 07/07/20 0327  BP: 130/67 129/68 128/67 108/62  Pulse: 89 87 69 78  Resp:  20 18 19   Temp:  97.7 F (36.5 C) 97.6 F (36.4 C) 98.5 F (36.9 C)  TempSrc:  Oral Oral Axillary  SpO2:  95% 97% 98%  Weight:    62.8 kg  Height:        Intake/Output Summary (Last 24 hours) at 07/07/2020 0813 Last data filed at 07/07/2020 0500 Gross per  24 hour  Intake 412 ml  Output 400 ml  Net 12 ml   Filed Weights   07/05/20 0410 07/06/20 0632 07/07/20 0327  Weight: 60.6 kg 62 kg 62.8 kg    Examination:  General exam: Chronically ill looking elderly female lying in bed.  No acute distress.  Currently on 5 L oxygen via nasal cannula.   Respiratory system: Bilateral decreased breath sounds at bases with some scattered crackles  cardiovascular system: S1-S2 heard, rate controlled gastrointestinal system: Abdomen is nondistended, soft and nontender.  Normal bowel sounds heard  extremities: No clubbing.  Trace lower extremity edema present. Central nervous system: Awake.  Very poor historian.  Slow to respond.  Does not participate in conversation much. No focal neurological deficits.  Moving extremities. skin: No obvious petechiae/rashes psychiatry: Flat affect   Data Reviewed: I have personally reviewed following labs and imaging studies  CBC: Recent Labs  Lab 07/01/20 0407 07/02/20 0224 07/03/20 0254 07/04/20 0832 07/05/20 0248  WBC 11.0* 11.3* 11.4* 8.3 8.5  NEUTROABS  --  9.5* 9.3* 6.1 6.9  HGB 12.9 13.7 13.2 12.5 11.3*  HCT 39.0 41.0 39.3 37.9 34.9*  MCV 88.8 88.6 87.1 89.0 89.9  PLT 134* 145* 170 149* 171   Basic Metabolic Panel: Recent Labs  Lab 07/03/20 0254 07/04/20 0832 07/05/20 0248 07/06/20 0253 07/07/20 0159  NA 128* 131* 134* 135 135  K 3.7 2.9* 3.2* 4.0 4.5  CL 75* 79* 82* 85* 85*  CO2 41* 39* 44* 39* 41*  GLUCOSE 98 100* 112* 107* 130*  BUN 15 13 19 20 22   CREATININE 0.52 0.53 0.63 0.65 0.53  CALCIUM 8.5* 8.7* 8.7* 8.8* 9.2  MG 1.8 1.5* 1.6* 2.2 2.1   GFR: Estimated Creatinine Clearance: 49.1 mL/min (by C-G formula based on SCr of 0.53 mg/dL). Liver Function Tests: Recent Labs  Lab 07/03/20 0254  AST 21  ALT 16  ALKPHOS 79  BILITOT 1.3*  PROT 5.7*  ALBUMIN 2.6*   No results for input(s): LIPASE, AMYLASE in the last 168 hours. No results for input(s): AMMONIA in the last 168  hours. Coagulation Profile: No results for input(s): INR, PROTIME in the last 168 hours. Cardiac Enzymes: No results for input(s): CKTOTAL, CKMB, CKMBINDEX, TROPONINI in the last 168 hours. BNP (last 3 results) No results for input(s): PROBNP in the last 8760 hours. HbA1C: No results for input(s): HGBA1C in the last 72 hours. CBG: No results for input(s): GLUCAP in the last 168 hours. Lipid Profile: No results for input(s): CHOL, HDL, LDLCALC, TRIG, CHOLHDL, LDLDIRECT in the last 72 hours. Thyroid Function Tests: No results for input(s): TSH, T4TOTAL, FREET4, T3FREE, THYROIDAB in the last 72 hours. Anemia Panel: No results for input(s): VITAMINB12, FOLATE, FERRITIN, TIBC, IRON, RETICCTPCT in the last 72 hours. Sepsis Labs: No results for input(s): PROCALCITON, LATICACIDVEN in the last 168 hours.  Recent Results (from the past 240 hour(s))  Respiratory Panel by RT PCR (Flu A&B, Covid) - Nasopharyngeal Swab     Status: None  Collection Time: 07/01/2020 11:36 PM   Specimen: Nasopharyngeal Swab  Result Value Ref Range Status   SARS Coronavirus 2 by RT PCR NEGATIVE NEGATIVE Final    Comment: (NOTE) SARS-CoV-2 target nucleic acids are NOT DETECTED.  The SARS-CoV-2 RNA is generally detectable in upper respiratoy specimens during the acute phase of infection. The lowest concentration of SARS-CoV-2 viral copies this assay can detect is 131 copies/mL. A negative result does not preclude SARS-Cov-2 infection and should not be used as the sole basis for treatment or other patient management decisions. A negative result may occur with  improper specimen collection/handling, submission of specimen other than nasopharyngeal swab, presence of viral mutation(s) within the areas targeted by this assay, and inadequate number of viral copies (<131 copies/mL). A negative result must be combined with clinical observations, patient history, and epidemiological information. The expected result is  Negative.  Fact Sheet for Patients:  https://www.moore.com/  Fact Sheet for Healthcare Providers:  https://www.young.biz/  This test is no t yet approved or cleared by the [place] FDA and  has been authorized for detection and/or diagnosis of SARS-CoV-2 by FDA under an Emergency Use Authorization (EUA). This EUA will remain  in effect (meaning this test can be used) for the duration of the COVID-19 declaration under Section 564(b)(1) of the Act, 21 U.S.C. section 360bbb-3(b)(1), unless the authorization is terminated or revoked sooner.     Influenza A by PCR NEGATIVE NEGATIVE Final   Influenza B by PCR NEGATIVE NEGATIVE Final    Comment: (NOTE) The Xpert Xpress SARS-CoV-2/FLU/RSV assay is intended as an aid in  the diagnosis of influenza from Nasopharyngeal swab specimens and  should not be used as a sole basis for treatment. Nasal washings and  aspirates are unacceptable for Xpert Xpress SARS-CoV-2/FLU/RSV  testing.  Fact Sheet for Patients: https://www.moore.com/  Fact Sheet for Healthcare Providers: https://www.young.biz/  This test is not yet approved or cleared by the [place] FDA and  has been authorized for detection and/or diagnosis of SARS-CoV-2 by  FDA under an Emergency Use Authorization (EUA). This EUA will remain  in effect (meaning this test can be used) for the duration of the  Covid-19 declaration under Section 564(b)(1) of the Act, 21  U.S.C. section 360bbb-3(b)(1), unless the authorization is  terminated or revoked. Performed at [facility], [address], [place], [place] [address]   Respiratory Panel by PCR     Status: None   Collection Time: 06/29/20  3:29 PM   Specimen: Nasopharyngeal Swab; Respiratory  Result Value Ref Range Status   Adenovirus NOT DETECTED NOT DETECTED Final   Coronavirus 229E NOT DETECTED NOT DETECTED Final    Comment: (NOTE) The  Coronavirus on the Respiratory Panel, DOES NOT test for the novel  Coronavirus (2019 nCoV)    Coronavirus HKU1 NOT DETECTED NOT DETECTED Final   Coronavirus NL63 NOT DETECTED NOT DETECTED Final   Coronavirus OC43 NOT DETECTED NOT DETECTED Final   Metapneumovirus NOT DETECTED NOT DETECTED Final   Rhinovirus / Enterovirus NOT DETECTED NOT DETECTED Final   Influenza A NOT DETECTED NOT DETECTED Final   Influenza B NOT DETECTED NOT DETECTED Final   Parainfluenza Virus 1 NOT DETECTED NOT DETECTED Final   Parainfluenza Virus 2 NOT DETECTED NOT DETECTED Final   Parainfluenza Virus 3 NOT DETECTED NOT DETECTED Final   Parainfluenza Virus 4 NOT DETECTED NOT DETECTED Final   Respiratory Syncytial Virus NOT DETECTED NOT DETECTED Final   Bordetella pertussis NOT DETECTED NOT DETECTED  Final   Chlamydophila pneumoniae NOT DETECTED NOT DETECTED Final   Mycoplasma pneumoniae NOT DETECTED NOT DETECTED Final    Comment: Performed at [facility], [address] [place], [place] [address]         Radiology Studies: No results found.      Scheduled Meds: . amLODipine  10 mg Oral QPM  . apixaban  2.5 mg Oral BID  . furosemide  40 mg Oral Daily  . irbesartan  150 mg Oral Daily  . levothyroxine  100 mcg Oral Q0600  . melatonin  3 mg Oral QHS  . metoprolol succinate  50 mg Oral QPM  . pantoprazole  40 mg Oral Daily  . sertraline  50 mg Oral q AM  . sodium chloride flush  3 mL Intravenous Q12H  . sodium chloride  1 g Oral BID WC  . temazepam  30 mg Oral QHS   Continuous Infusions: . ampicillin-sulbactam (UNASYN) IV Stopped (07/07/20 [id number])          Glade LloydKshitiz Briany Aye, MD [facility] 07/07/2020, 8:13 AM

## 2020-07-08 ENCOUNTER — Inpatient Hospital Stay (HOSPITAL_COMMUNITY): Payer: Medicare Other

## 2020-07-08 DIAGNOSIS — I5033 Acute on chronic diastolic (congestive) heart failure: Secondary | ICD-10-CM | POA: Diagnosis not present

## 2020-07-08 DIAGNOSIS — J90 Pleural effusion, not elsewhere classified: Secondary | ICD-10-CM | POA: Diagnosis not present

## 2020-07-08 DIAGNOSIS — J690 Pneumonitis due to inhalation of food and vomit: Secondary | ICD-10-CM | POA: Diagnosis not present

## 2020-07-08 DIAGNOSIS — R5381 Other malaise: Secondary | ICD-10-CM

## 2020-07-08 DIAGNOSIS — J9601 Acute respiratory failure with hypoxia: Secondary | ICD-10-CM | POA: Diagnosis not present

## 2020-07-08 HISTORY — PX: IR THORACENTESIS ASP PLEURAL SPACE W/IMG GUIDE: IMG5380

## 2020-07-08 LAB — CBC WITH DIFFERENTIAL/PLATELET
Abs Immature Granulocytes: 0.04 10*3/uL (ref 0.00–0.07)
Basophils Absolute: 0 10*3/uL (ref 0.0–0.1)
Basophils Relative: 0 %
Eosinophils Absolute: 0.1 10*3/uL (ref 0.0–0.5)
Eosinophils Relative: 1 %
HCT: 34.4 % — ABNORMAL LOW (ref 36.0–46.0)
Hemoglobin: 10.6 g/dL — ABNORMAL LOW (ref 12.0–15.0)
Immature Granulocytes: 1 %
Lymphocytes Relative: 8 %
Lymphs Abs: 0.7 10*3/uL (ref 0.7–4.0)
MCH: 29.9 pg (ref 26.0–34.0)
MCHC: 30.8 g/dL (ref 30.0–36.0)
MCV: 97.2 fL (ref 80.0–100.0)
Monocytes Absolute: 0.6 10*3/uL (ref 0.1–1.0)
Monocytes Relative: 8 %
Neutro Abs: 6.6 10*3/uL (ref 1.7–7.7)
Neutrophils Relative %: 82 %
Platelets: 162 10*3/uL (ref 150–400)
RBC: 3.54 MIL/uL — ABNORMAL LOW (ref 3.87–5.11)
RDW: 13.5 % (ref 11.5–15.5)
WBC: 8 10*3/uL (ref 4.0–10.5)
nRBC: 0 % (ref 0.0–0.2)

## 2020-07-08 LAB — LACTATE DEHYDROGENASE, PLEURAL OR PERITONEAL FLUID: LD, Fluid: 61 U/L — ABNORMAL HIGH (ref 3–23)

## 2020-07-08 LAB — BASIC METABOLIC PANEL
Anion gap: 11 (ref 5–15)
BUN: 22 mg/dL (ref 8–23)
CO2: 39 mmol/L — ABNORMAL HIGH (ref 22–32)
Calcium: 9.5 mg/dL (ref 8.9–10.3)
Chloride: 85 mmol/L — ABNORMAL LOW (ref 98–111)
Creatinine, Ser: 0.53 mg/dL (ref 0.44–1.00)
GFR, Estimated: >60 mL/min (ref >60)
Glucose, Bld: 116 mg/dL — ABNORMAL HIGH (ref 70–99)
Potassium: 4.7 mmol/L (ref 3.5–5.1)
Sodium: 135 mmol/L (ref 135–145)

## 2020-07-08 LAB — GRAM STAIN

## 2020-07-08 LAB — PROTEIN, PLEURAL OR PERITONEAL FLUID: Total protein, fluid: <3 g/dL

## 2020-07-08 LAB — MAGNESIUM: Magnesium: 2 mg/dL (ref 1.7–2.4)

## 2020-07-08 MED ORDER — POLYETHYLENE GLYCOL 3350 17 G PO PACK
17.0000 g | PACK | Freq: Two times a day (BID) | ORAL | Status: DC | PRN
  Administered 2020-07-08 – 2020-07-09 (×2): 17 g via ORAL
  Filled 2020-07-08 (×2): qty 1

## 2020-07-08 MED ORDER — SENNOSIDES-DOCUSATE SODIUM 8.6-50 MG PO TABS
1.0000 | ORAL_TABLET | Freq: Two times a day (BID) | ORAL | Status: DC | PRN
  Administered 2020-07-08: 1 via ORAL

## 2020-07-08 MED ORDER — LIDOCAINE HCL 1 % IJ SOLN
INTRAMUSCULAR | Status: AC
  Filled 2020-07-08: qty 20

## 2020-07-08 NOTE — Progress Notes (Signed)
Wean Oxygen gradually from 5L to 3L; sat 94% currently. Mikal Plane, BSN, RN

## 2020-07-08 NOTE — Procedures (Signed)
PROCEDURE SUMMARY:  Successful image-guided left thoracentesis. Yielded 750 milliliters of clear yellow fluid. Patient tolerated procedure well. EBL: Zero No immediate complications.  Specimen was sent for labs. Post procedure CXR shows no pneumothorax.  Please see imaging section of Epic for full dictation.  Villa Herb PA-C 07/08/2020 8:46 AM

## 2020-07-08 NOTE — TOC Progression Note (Addendum)
Transition of Care [facility]) - Progression Note    Patient Details  Name: [name] MRN: [id number] Date of Birth: 03/14/1931  [facility] CM/SW Contact  [name], [place] Phone Number: 07/08/2020, 12:20 PM  Clinical Narrative:     Update 11/24 3:02pm- CSW spoke with pateints daughter [name] who chose SNF placement at [facility]. CSW called [name] with [facility] who confirmed they can accept patient for SNF placement on Friday. CSW updated [name] with [facility] who follows patient with SNF choice as requested.  Patient has SNF bed at [facility].   CSW will continue to follow.  CSW spoke with patient at bedside to provide SNF bed offers. Patient requested for CSW to call her daughter [name] to help with SNF choice. CSW called patients daughter and provided SNF bed offers. She is going to call CSW back today with SNF choice.  Pending SNF choice.  CSW will continue to follow.   Expected Discharge Plan: Skilled Nursing Facility Barriers to Discharge: Continued Medical Work up  Expected Discharge Plan and Services Expected Discharge Plan: Skilled Nursing Facility   Discharge Planning Services: CM Consult Post Acute Care Choice: Home Health Living arrangements for the past 2 months: Single Family Home                 DME Arranged: N/A DME Agency: NA       HH Arranged: RN, PT, OT HH Agency: [facility] Date [facility] Agency Contacted: 07/01/20 Time HH Agency Contacted: 1601 Representative spoke with at [facility] Agency: Lorenza Chick   Social Determinants of Health (SDOH) Interventions    Readmission Risk Interventions No flowsheet data found.

## 2020-07-08 NOTE — Progress Notes (Signed)
  Speech Language Pathology Treatment: Dysphagia  Patient Details Name: [name] MRN: [id number] DOB: Dec 10, 1930 Today's Date: 07/08/2020 Time: 2297-9892 SLP Time Calculation (min) (ACUTE ONLY): 20 min  Assessment / Plan / Recommendation Clinical Impression  Showed pt two exercises to begin using to work on strengthening pharyngeal musculature.  Encouraged her to use the effortful swallow during meals and outside meals (she executed with sips of nectar thick liquid independently).  Instructed pt re: purpose and execution of Masako maneuver - pt able to do so independently.  Recommended 10x/set, at least three sets per day at minimum.  Pt verbalized understanding. We discussed the potential to repeat MBS in the next few days to determine improvements.  SLP will follow.   HPI HPI: Pt is an 86 y.o. female with medical history significant for paroxysmal atrial fibrillation on Eliquis, hypertension, hypothyroidism, GERD, and anxiety who presented to the ED for evaluation of shortness of breath and cough.  CXR revealed interval development of bilateral pleural effusions, right greater than left, with associated compressive atelectasis. On date of admission, pt reported that she was taking her afternoon medications (Eliquis, Tylenol, and vitamin supplements) when she choked on the pills, began to have nonproductive coughing fits, and increased SOB. Pt reported to referring MD, that she has "frequent issues with choking on her pills, most notably on her potassium supplement which she takes every morning." CT chest 11/15: Multifocal pulmonary opacities, infection and/or aspiration. Fluid in the right-sided endobronchial tree, for which aspiration should be excluded clinically. CXR 11/17: Pleural effusions bilaterally with multifocal pneumonia, more on the left. Bedside swallow evaluation on 11/14 was negative for signs of aspiration. CXR 11/24: Decreased left pleural effusion status post thoracentesis. No  visible pneumothorax. Similar layering right pleural effusion with suspected right lower lobe atelectasis/collapse. Additional bibasilar predominant opacities may represent atelectasis, aspiration, and/or pneumonia.      SLP Plan  Continue with current plan of care       Recommendations  Diet recommendations: Dysphagia 3 (mechanical soft);Nectar-thick liquid Liquids provided via: Cup;Straw Medication Administration: Whole meds with puree Supervision: Intermittent supervision to cue for compensatory strategies Compensations: Slow rate;Small sips/bites;Chin tuck Postural Changes and/or Swallow Maneuvers: Seated upright 90 degrees                Oral Care Recommendations: Oral care BID Follow up Recommendations: Home health SLP SLP Visit Diagnosis: Dysphagia, unspecified (R13.10) Plan: Continue with current plan of care       GO               Tysheka Fanguy L. Samson Frederic, MA CCC/SLP Acute Rehabilitation Services Office number [phone] Pager [phone]  Blenda Mounts Laurice 07/08/2020, 3:00 PM

## 2020-07-08 NOTE — Progress Notes (Signed)
PROGRESS NOTE  [name] VXB:[id number] DOB: Apr 18, 1931   PCP: Kermit Balo, DO  Patient is from: Home  DOA: 2020/07/21 LOS: 10  Chief complaints: Shortness of breath  Brief Narrative / Interim history: 86 year old female with history of paroxysmal A. fib on Eliquis, diastolic CHF, HTN, hypothyroidism, anxiety and GERD presenting with shortness of breath and admitted for acute on chronic diastolic CHF, possible aspiration pneumonia and hyponatremia.  She was treated with IV Lasix and transition to p.o. Lasix.  She also completed antibiotic course for 7 days for aspiration pneumonia.  However, she continued to require significant oxygen.  CXR obtained on 07/07/2020 and showed a moderately large pleural effusion bilaterally.  Left thoracocentesis with removal of 750 cc transudative fluid on 07/08/2020.  SLP recommended dysphagia 3 diet.  Therapy recommended SNF  Subjective: Seen and examined earlier after she returned from thoracocentesis.  She says she does not feel good.  When asked specifically, she says she has not a bowel movement in 3 days.  She denies chest pain.  She says her breathing is better.  Denies nausea or vomiting.  Objective: Vitals:   07/08/20 1140 07/08/20 1210 07/08/20 1240 07/08/20 1255  BP: 111/62 (!) 95/55 113/66 (!) 101/45  Pulse: 86 77 66 73  Resp: 18 (!) 21 16 16   Temp:      TempSrc:      SpO2: 100% 95% 98% 92%  Weight:      Height:        Intake/Output Summary (Last 24 hours) at 07/08/2020 1520 Last data filed at 07/08/2020 0850 Gross per 24 hour  Intake 153 ml  Output 3425 ml  Net -3272 ml   Filed Weights   07/06/20 07/08/20 07/07/20 0327 07/08/20 0432  Weight: 62 kg 62.8 kg 57.5 kg    Examination:  GENERAL: Frail looking elderly female.  No apparent distress. HEENT: MMM.  Vision and hearing grossly intact.  NECK: Supple.  No apparent JVD.  RESP: 98% on 4 L.  No IWOB.  Diminished aeration over lower lung fields mainly on the  right CVS: Irregular rhythm.  Normal rate. Heart sounds normal.  ABD/GI/GU: BS+. Abd soft, NTND.  MSK/EXT:  Moves extremities. No apparent deformity. No edema.  SKIN: no apparent skin lesion or wound NEURO: Awake, alert and oriented appropriately.  No apparent focal neuro deficit. PSYCH: Calm. Normal affect.  Procedures:  07/08/2020-left-sided thoracocentesis with removal of 750 cc transudative fluid  Microbiology summarized: COVID-19 and influenza PCR negative. Follow RVP panel negative. Pleural fluid culture pending.  Assessment & Plan: Acute hypoxic respiratory failure: Multifactorial including CHF exacerbation, pneumonia and pleural effusion. Currently on 4 L at rest. -Treat treatable causes. -Wean oxygen as able  Possible community-acquired pneumonia/aspiration pneumonia: CT chest showed multifocal opacities. -Completed 7 days of antibiotic course with IV Unasyn and Augmentin on 11/23. -Dysphagia 3 diet per SLP recommendation. -Incentive telemetry, OOB, PT/OT  Acute on chronic diastolic heart failure: TTE on 11/14 with EF of 60 to 65%, RVSP of 51, severe LAE, severe RAE.  Diuresed with IV Lasix and transition to p.o.  She had 2.7 L UOP/24 hours.  Net -4.2 L charted.  Renal function and BP stable.  Mild alkalosis -Continue p.o. Lasix 20 mg daily -Monitor fluid status, renal function and electrolytes.   Moderate bilateral pleural effusion-S/p left thoracocentesis with removal of 750 cc transudative fluid.  Likely due to CHF. -Order right-sided thoracocentesis for tomorrow-07/09/2020. -Follow fluid cultures  Paroxysmal A. Fib: Rate controlled. -Continue metoprolol and  Eliquis -Outpatient follow-up with cardiology  Hyponatremia: Resolved. -Monitor  Metabolic alkalosis: Contraction alkalosis?  CO2 retention?  Improving. -Wean oxygen as able.  Hypokalemia/hypomagnesemia: Resolved. -Improved. Repeat a.m. labs.  Hypothyroidism -Continue Synthroid  Essential  hypertension normotensive for most part but soft. -Continue irbesartan, metoprolol and Lasix -Discontinue amlodipine and HCTZ  GERD -Continue Protonix  Anxiety/insomnia: On Zoloft, Xanax and temazepam.  Not a great combination. polypharmacy. -Continue home medications for now  Generalized deconditioning -Therapy recommended SNF.   Body mass index is 19.28 kg/m.         DVT prophylaxis:  apixaban (ELIQUIS) tablet 2.5 mg Start: 06/28/20 1000 apixaban (ELIQUIS) tablet 2.5 mg  Code Status: DNR/DNI Family Communication: Patient and/or RN. Available if any question.  Status is: Inpatient  Remains inpatient appropriate because:Ongoing diagnostic testing needed not appropriate for outpatient work up, Unsafe d/c plan and Inpatient level of care appropriate due to severity of illness   Dispo: The patient is from: Home              Anticipated d/c is to: SNF              Anticipated d/c date is: 2 days              Patient currently is not medically stable to d/c.       Consultants:  Interventional radiology   Sch Meds:  Scheduled Meds: . amLODipine  10 mg Oral QPM  . apixaban  2.5 mg Oral BID  . furosemide  20 mg Oral Daily  . irbesartan  150 mg Oral Daily  . levothyroxine  100 mcg Oral Q0600  . lidocaine      . melatonin  3 mg Oral QHS  . metoprolol succinate  50 mg Oral QPM  . pantoprazole  40 mg Oral Daily  . sertraline  50 mg Oral q AM  . sodium chloride flush  3 mL Intravenous Q12H  . sodium chloride  1 g Oral Daily  . temazepam  30 mg Oral QHS   Continuous Infusions: PRN Meds:.acetaminophen **OR** acetaminophen, ALPRAZolam, hydrocortisone, ondansetron **OR** ondansetron (ZOFRAN) IV, polyethylene glycol, Resource ThickenUp Clear, senna-docusate  Antimicrobials: Anti-infectives (From admission, onward)   Start     Dose/Rate Route Frequency Ordered Stop   07/01/20 1400  Ampicillin-Sulbactam (UNASYN) 3 g in sodium chloride 0.9 % 100 mL IVPB        3  g 200 mL/hr over 30 Minutes Intravenous Every 8 hours 07/01/20 1250 07/07/20 2359   06/30/20 1145  amoxicillin-clavulanate (AUGMENTIN) 875-125 MG per tablet 1 tablet  Status:  Discontinued        1 tablet Oral Every 12 hours 06/30/20 1055 07/01/20 1138       I have personally reviewed the following labs and images: CBC: Recent Labs  Lab 07/02/20 0224 07/03/20 0254 07/04/20 0832 07/05/20 0248 07/08/20 0210  WBC 11.3* 11.4* 8.3 8.5 8.0  NEUTROABS 9.5* 9.3* 6.1 6.9 6.6  HGB 13.7 13.2 12.5 11.3* 10.6*  HCT 41.0 39.3 37.9 34.9* 34.4*  MCV 88.6 87.1 89.0 89.9 97.2  PLT 145* 170 149* 171 162   BMP &GFR Recent Labs  Lab 07/04/20 0832 07/05/20 0248 07/06/20 0253 07/07/20 0159 07/08/20 0210  NA 131* 134* 135 135 135  K 2.9* 3.2* 4.0 4.5 4.7  CL 79* 82* 85* 85* 85*  CO2 39* 44* 39* 41* 39*  GLUCOSE 100* 112* 107* 130* 116*  BUN 13 19 20 22 22   CREATININE 0.53 0.63 0.65 0.53 0.53  CALCIUM 8.7* 8.7* 8.8* 9.2 9.5  MG 1.5* 1.6* 2.2 2.1 2.0   Estimated Creatinine Clearance: 45 mL/min (by C-G formula based on SCr of 0.53 mg/dL). Liver & Pancreas: Recent Labs  Lab 07/03/20 0254  AST 21  ALT 16  ALKPHOS 79  BILITOT 1.3*  PROT 5.7*  ALBUMIN 2.6*   No results for input(s): LIPASE, AMYLASE in the last 168 hours. No results for input(s): AMMONIA in the last 168 hours. Diabetic: No results for input(s): HGBA1C in the last 72 hours. No results for input(s): GLUCAP in the last 168 hours. Cardiac Enzymes: No results for input(s): CKTOTAL, CKMB, CKMBINDEX, TROPONINI in the last 168 hours. No results for input(s): PROBNP in the last 8760 hours. Coagulation Profile: No results for input(s): INR, PROTIME in the last 168 hours. Thyroid Function Tests: No results for input(s): TSH, T4TOTAL, FREET4, T3FREE, THYROIDAB in the last 72 hours. Lipid Profile: No results for input(s): CHOL, HDL, LDLCALC, TRIG, CHOLHDL, LDLDIRECT in the last 72 hours. Anemia Panel: No results for input(s):  VITAMINB12, FOLATE, FERRITIN, TIBC, IRON, RETICCTPCT in the last 72 hours. Urine analysis:    Component Value Date/Time   COLORURINE YELLOW 11/17/2019 1253   APPEARANCEUR CLOUDY (A) 11/17/2019 1253   LABSPEC 1.006 11/17/2019 1253   PHURINE 8.0 11/17/2019 1253   GLUCOSEU NEGATIVE 11/17/2019 1253   HGBUR NEGATIVE 11/17/2019 1253   BILIRUBINUR NEGATIVE 11/17/2019 1253   KETONESUR NEGATIVE 11/17/2019 1253   PROTEINUR NEGATIVE 11/17/2019 1253   UROBILINOGEN 1.0 07/22/2007 2239   NITRITE NEGATIVE 11/17/2019 1253   LEUKOCYTESUR NEGATIVE 11/17/2019 1253   Sepsis Labs: Invalid input(s): PROCALCITONIN, LACTICIDVEN  Microbiology: Recent Results (from the past 240 hour(s))  Respiratory Panel by PCR     Status: None   Collection Time: 06/29/20  3:29 PM   Specimen: Nasopharyngeal Swab; Respiratory  Result Value Ref Range Status   Adenovirus NOT DETECTED NOT DETECTED Final   Coronavirus 229E NOT DETECTED NOT DETECTED Final    Comment: (NOTE) The Coronavirus on the Respiratory Panel, DOES NOT test for the novel  Coronavirus (2019 nCoV)    Coronavirus HKU1 NOT DETECTED NOT DETECTED Final   Coronavirus NL63 NOT DETECTED NOT DETECTED Final   Coronavirus OC43 NOT DETECTED NOT DETECTED Final   Metapneumovirus NOT DETECTED NOT DETECTED Final   Rhinovirus / Enterovirus NOT DETECTED NOT DETECTED Final   Influenza A NOT DETECTED NOT DETECTED Final   Influenza B NOT DETECTED NOT DETECTED Final   Parainfluenza Virus 1 NOT DETECTED NOT DETECTED Final   Parainfluenza Virus 2 NOT DETECTED NOT DETECTED Final   Parainfluenza Virus 3 NOT DETECTED NOT DETECTED Final   Parainfluenza Virus 4 NOT DETECTED NOT DETECTED Final   Respiratory Syncytial Virus NOT DETECTED NOT DETECTED Final   Bordetella pertussis NOT DETECTED NOT DETECTED Final   Chlamydophila pneumoniae NOT DETECTED NOT DETECTED Final   Mycoplasma pneumoniae NOT DETECTED NOT DETECTED Final    Comment: Performed at [facility], [address], [place], [place] [address]  SARS Coronavirus 2 by RT PCR (hospital order, performed in [facility] hospital lab) Nasopharyngeal Nasopharyngeal Swab     Status: None   Collection Time: 07/07/20  4:35 PM   Specimen: Nasopharyngeal Swab  Result Value Ref Range Status   SARS Coronavirus 2 NEGATIVE NEGATIVE Final    Comment: (NOTE) SARS-CoV-2 target nucleic acids are NOT DETECTED.  The SARS-CoV-2 RNA is generally detectable in upper and lower respiratory specimens during the acute phase of infection. The lowest  concentration of SARS-CoV-2 viral copies this assay can detect is 250 copies / mL. A negative result does not preclude SARS-CoV-2 infection and should not be used as the sole basis for treatment or other patient management decisions.  A negative result may occur with improper specimen collection / handling, submission of specimen other than nasopharyngeal swab, presence of viral mutation(s) within the areas targeted by this assay, and inadequate number of viral copies (<250 copies / mL). A negative result must be combined with clinical observations, patient history, and epidemiological information.  Fact Sheet for Patients:   BoilerBrush.com.cyhttps://www.fda.gov/media/136312/download  Fact Sheet for Healthcare Providers: https://pope.com/https://www.fda.gov/media/136313/download  This test is not yet approved or  cleared by the [place] FDA and has been authorized for detection and/or diagnosis of SARS-CoV-2 by FDA under an Emergency Use Authorization (EUA).  This EUA will remain in effect (meaning this test can be used) for the duration of the COVID-19 declaration under Section 564(b)(1) of the Act, 21 U.S.C. section 360bbb-3(b)(1), unless the authorization is terminated or revoked sooner.  Performed at [facility], [address] [place], [place] [address]   Gram stain     Status: None   Collection Time: 07/08/20  8:45 AM   Specimen: Lung, Right; Pleural Fluid  Result Value Ref Range  Status   Specimen Description PLEURAL FLUID  Final   Special Requests RIGHT LUNG  Final   Gram Stain   Final    WBC PRESENT,BOTH PMN AND MONONUCLEAR NO ORGANISMS SEEN CYTOSPIN SMEAR Performed at [facility], [address] [place], [place] [address]    Report Status 07/08/2020 FINAL  Final    Radiology Studies: DG Chest 1 View  Result Date: 07/08/2020 CLINICAL DATA:  Status post left thoracentesis. EXAM: CHEST  1 VIEW COMPARISON:  July 07, 2020. FINDINGS: Decreased left pleural effusion status post thoracentesis. No visible pneumothorax. Similar layering right pleural effusion. Suspected right lower lobe collapse/atelectasis. Overlying bibasilar predominant opacities. Similar cardiomediastinal silhouette. Aortic atherosclerosis. IMPRESSION: 1. Decreased left pleural effusion status post thoracentesis. No visible pneumothorax. 2. Similar layering right pleural effusion with suspected right lower lobe atelectasis/collapse. 3. Additional bibasilar predominant opacities may represent atelectasis, aspiration, and/or pneumonia. Electronically Signed   By: Feliberto HartsFrederick S Jones MD   On: 07/08/2020 09:09   IR THORACENTESIS ASP PLEURAL SPACE W/IMG GUIDE  Result Date: 07/08/2020 INDICATION: Patient with history CHF, paroxysmal AFib, possible community-acquired pneumonia/aspiration pneumonia - imaging shows bilateral pleural effusions. Request to IR for diagnostic and therapeutic thoracentesis. EXAM: ULTRASOUND GUIDED LEFT THORACENTESIS MEDICATIONS: 7 mL 1% lidocaine COMPLICATIONS: None immediate.  Post chest radiograph shows no pneumothorax. PROCEDURE: An ultrasound guided thoracentesis was thoroughly discussed with the patient and questions answered. The benefits, risks, alternatives and complications were also discussed. The patient understands and wishes to proceed with the procedure. Written consent was obtained. Ultrasound was performed to localize and mark an adequate pocket of fluid in the  left chest. The area was then prepped and draped in the normal sterile fashion. 1% Lidocaine was used for local anesthesia. Under ultrasound guidance a 6 Fr Safe-T-Centesis catheter was introduced. Thoracentesis was performed. The catheter was removed and a dressing applied. FINDINGS: A total of approximately 750 mL of clear yellow fluid was removed. Samples were sent to the laboratory as requested by the clinical team. IMPRESSION: Successful ultrasound guided left thoracentesis yielding 750 mL of pleural fluid. Read by Lynnette CaffeyShannon Watterson, PA-C Electronically Signed   By: Corlis Leak  Hassell M.D.   On: 07/08/2020 09:08  Coila Wardell T. Novalie Leamy [facility]  If 7PM-7AM, please contact night-coverage [email or web address] 07/08/2020, 3:20 PM

## 2020-07-09 ENCOUNTER — Inpatient Hospital Stay (HOSPITAL_COMMUNITY): Payer: Medicare Other

## 2020-07-09 DIAGNOSIS — J90 Pleural effusion, not elsewhere classified: Secondary | ICD-10-CM | POA: Diagnosis not present

## 2020-07-09 DIAGNOSIS — J9601 Acute respiratory failure with hypoxia: Secondary | ICD-10-CM | POA: Diagnosis not present

## 2020-07-09 DIAGNOSIS — I5033 Acute on chronic diastolic (congestive) heart failure: Secondary | ICD-10-CM | POA: Diagnosis not present

## 2020-07-09 DIAGNOSIS — J690 Pneumonitis due to inhalation of food and vomit: Secondary | ICD-10-CM | POA: Diagnosis not present

## 2020-07-09 LAB — RENAL FUNCTION PANEL
Albumin: 2.2 g/dL — ABNORMAL LOW (ref 3.5–5.0)
Albumin: 2.5 g/dL — ABNORMAL LOW (ref 3.5–5.0)
Anion gap: 6 (ref 5–15)
Anion gap: 8 (ref 5–15)
BUN: 17 mg/dL (ref 8–23)
BUN: 18 mg/dL (ref 8–23)
CO2: 42 mmol/L — ABNORMAL HIGH (ref 22–32)
CO2: 38 mmol/L — ABNORMAL HIGH (ref 22–32)
Calcium: 9.4 mg/dL (ref 8.9–10.3)
Calcium: 9.7 mg/dL (ref 8.9–10.3)
Chloride: 84 mmol/L — ABNORMAL LOW (ref 98–111)
Chloride: 84 mmol/L — ABNORMAL LOW (ref 98–111)
Creatinine, Ser: 0.51 mg/dL (ref 0.44–1.00)
Creatinine, Ser: 0.49 mg/dL (ref 0.44–1.00)
GFR, Estimated: >60 mL/min (ref >60)
GFR, Estimated: >60 mL/min (ref >60)
Glucose, Bld: 106 mg/dL — ABNORMAL HIGH (ref 70–99)
Glucose, Bld: 197 mg/dL — ABNORMAL HIGH (ref 70–99)
Phosphorus: 4.4 mg/dL (ref 2.5–4.6)
Phosphorus: 4.4 mg/dL (ref 2.5–4.6)
Potassium: 5.2 mmol/L — ABNORMAL HIGH (ref 3.5–5.1)
Potassium: 5.2 mmol/L — ABNORMAL HIGH (ref 3.5–5.1)
Sodium: 132 mmol/L — ABNORMAL LOW (ref 135–145)
Sodium: 130 mmol/L — ABNORMAL LOW (ref 135–145)

## 2020-07-09 LAB — BRAIN NATRIURETIC PEPTIDE: B Natriuretic Peptide: 398.5 pg/mL — ABNORMAL HIGH (ref 0.0–100.0)

## 2020-07-09 LAB — CBC
HCT: 35.9 % — ABNORMAL LOW (ref 36.0–46.0)
Hemoglobin: 11.1 g/dL — ABNORMAL LOW (ref 12.0–15.0)
MCH: 29.8 pg (ref 26.0–34.0)
MCHC: 30.9 g/dL (ref 30.0–36.0)
MCV: 96.5 fL (ref 80.0–100.0)
Platelets: 167 10*3/uL (ref 150–400)
RBC: 3.72 MIL/uL — ABNORMAL LOW (ref 3.87–5.11)
RDW: 13.2 % (ref 11.5–15.5)
WBC: 9 10*3/uL (ref 4.0–10.5)
nRBC: 0 % (ref 0.0–0.2)

## 2020-07-09 LAB — ACID FAST SMEAR (AFB, MYCOBACTERIA): Acid Fast Smear: NEGATIVE

## 2020-07-09 LAB — MAGNESIUM: Magnesium: 1.9 mg/dL (ref 1.7–2.4)

## 2020-07-09 MED ORDER — BETHANECHOL CHLORIDE 10 MG PO TABS
10.0000 mg | ORAL_TABLET | Freq: Three times a day (TID) | ORAL | Status: DC
  Administered 2020-07-09 – 2020-07-10 (×5): 10 mg via ORAL
  Filled 2020-07-09 (×8): qty 1

## 2020-07-09 MED ORDER — TEMAZEPAM 15 MG PO CAPS
15.0000 mg | ORAL_CAPSULE | Freq: Every evening | ORAL | Status: DC | PRN
  Administered 2020-07-09: 15 mg via ORAL
  Filled 2020-07-09: qty 1

## 2020-07-09 MED ORDER — BISACODYL 10 MG RE SUPP
10.0000 mg | Freq: Once | RECTAL | Status: AC
  Administered 2020-07-09: 10 mg via RECTAL
  Filled 2020-07-09: qty 1

## 2020-07-09 MED ORDER — MAGNESIUM CITRATE PO SOLN: 1.0000 | Freq: Every day | ORAL | Status: DC | PRN

## 2020-07-09 MED ORDER — BETHANECHOL CHLORIDE 25 MG PO TABS
25.0000 mg | ORAL_TABLET | Freq: Once | ORAL | Status: AC
  Administered 2020-07-09: 25 mg via ORAL
  Filled 2020-07-09: qty 1

## 2020-07-09 MED ORDER — FUROSEMIDE 10 MG/ML IJ SOLN
40.0000 mg | Freq: Once | INTRAMUSCULAR | Status: AC
  Administered 2020-07-09: 40 mg via INTRAVENOUS
  Filled 2020-07-09: qty 4

## 2020-07-09 NOTE — Progress Notes (Signed)
Interventional Radiology Brief Note:  IR aware of request for thoracentesis.  Patient s/p left thoracentesis 11/24, now in need of right-sided thoracentesis.  Review of chart shows patient is stable today.  [place] unable to accomodate today.  Will reassess for possible procedure 11/26.  Loyce Dys, MS RD PA-C 11:33 AM

## 2020-07-09 NOTE — Progress Notes (Signed)
Patient oxygen saturation at 84% on 4L oxygen via N/C. Patient instructed to cough and deep breathe. Oxygen increased to 5L via N/C. Oxygen saturations increasesd to 88%. Oxygen increased to 6L via N/C and oxygen saturaion up to 91%. Taye T. Alanda Slim MD paged and notified.

## 2020-07-09 NOTE — Progress Notes (Signed)
Pt complains of moderate constipation and no BM x3 days. Received Miralax and Senokot yesterday afternoon with no results. Pt has also had difficulty urinating yesterday and has need I&O cathed x2 yesterday which is unusual for her-she has been voiding prior to this. Provider on call paged-order received for Dulcolax suppository per request. Bladder scan shows 400cc in bladder-pt denies being uncomfortable and denies urge to urinate at this time. Dierdre Highman, RN

## 2020-07-09 NOTE — Progress Notes (Signed)
PROGRESS NOTE  [name] BSJ:[id number] DOB: 12-30-1930   PCP: Kermit Balo, DO  Patient is from: Home  DOA: 07/11/2020 LOS: 11  Chief complaints: Shortness of breath  Brief Narrative / Interim history: 86 year old female with history of paroxysmal A. fib on Eliquis, diastolic CHF, HTN, hypothyroidism, anxiety and GERD presenting with shortness of breath and admitted for acute on chronic diastolic CHF, possible aspiration pneumonia and hyponatremia.  She was treated with IV Lasix and transition to p.o. Lasix.  She also completed antibiotic course for 7 days for aspiration pneumonia.  However, she continued to require significant oxygen.  CXR obtained on 07/07/2020 and showed a moderately large pleural effusion bilaterally.  Left thoracocentesis with removal of 750 cc transudative fluid on 07/08/2020.  Plan for right-sided thoracocentesis on 07/10/2020.  SLP recommended dysphagia 3 diet.  Therapy recommended SNF  Subjective: Seen and examined earlier this morning.  No major events overnight of this morning.  Continues to complain constipation.  She was given MiraLAX and Dulcolax suppository without success.  There is also some concern about urinary retention but she was able to void about 200 cc on bedside commode.  Breathing improved.  She denies chest pain, nausea or vomiting.  Objective: Vitals:   07/09/20 0222 07/09/20 0522 07/09/20 0523 07/09/20 0932  BP: 129/60 (!) 134/96    Pulse: 68 77 84 83  Resp: 19 (!) 23 20 (!) 24  Temp: 97.8 F (36.6 C) (!) 97.4 F (36.3 C) 97.9 F (36.6 C)   TempSrc: Oral Oral Oral   SpO2: 96% 93%    Weight:  64.7 kg    Height:        Intake/Output Summary (Last 24 hours) at 07/09/2020 1318 Last data filed at 07/09/2020 0551 Gross per 24 hour  Intake 478 ml  Output 600 ml  Net -122 ml   Filed Weights   07/07/20 0327 07/08/20 0432 07/09/20 0522  Weight: 62.8 kg 57.5 kg 64.7 kg    Examination:  GENERAL: Frail looking elderly  female.  No apparent distress. HEENT: MMM.  Vision and hearing grossly intact.  NECK: Supple.  No apparent JVD.  RESP: 93% on 4 L. No IWOB.  Diminished aeration over lower lung fields mainly on the right CVS:  RRR. Heart sounds normal.  ABD/GI/GU: BS+. Abd soft, NTND.  MSK/EXT:  Moves extremities. No apparent deformity.  Trace edema. SKIN: no apparent skin lesion or wound NEURO: Awake, alert and oriented appropriately.  No apparent focal neuro deficit. PSYCH: Calm. Normal affect.   Procedures:  07/08/2020-left-sided thoracocentesis with removal of 750 cc transudative fluid  Microbiology summarized: COVID-19 and influenza PCR negative. Follow RVP panel negative. Pleural fluid culture pending.  Assessment & Plan: Acute hypoxic respiratory failure: Multifactorial including CHF exacerbation, pneumonia and pleural effusion. Currently on 4 L at rest. -Treat treatable causes as below -Wean oxygen as able  Possible community-acquired pneumonia/aspiration pneumonia: CT chest showed multifocal opacities. -Completed 7 days of antibiotic course with IV Unasyn and Augmentin on 11/23. -Dysphagia-3 diet per SLP recommendation. -Incentive telemetry, OOB, PT/OT  Acute on chronic diastolic heart failure: TTE on 11/14 with EF of 60 to 65%, RVSP of 51, severe LAE, severe RAE. Diuresed with IV Lasix and transitioned to p.o. she had about 600 cc UOP charted.  Net 4.7 L.  Renal function and BP stable.  Mild alkalosis -Continue p.o. Lasix 20 mg daily -Add Diamox for metabolic alkalosis -Monitor fluid status, renal function and electrolytes.   Moderate bilateral pleural effusion-S/p  left thoracocentesis with removal of 750 cc transudative and culture negative fluid.  Likely due to CHF. -Right-sided thoracocentesis planned for 07/10/2020.  Paroxysmal A. Fib: Rate controlled. -Continue metoprolol and Eliquis -Outpatient follow-up with cardiology  Hyponatremia: Na 132. -Monitor  Mild  hyperkalemia: Unclear etiology.  Not on medication that could contribute -Recheck  Metabolic alkalosis: Contraction alkalosis?  CO2 retention?  -Wean oxygen as able. -Diamox as above.  Hypokalemia/hypomagnesemia: Resolved. -Improved. Repeat a.m. labs.  Hypothyroidism -Continue Synthroid  Essential hypertension normotensive for most part but soft. -Continue irbesartan, metoprolol and Lasix -Discontinue amlodipine and HCTZ  GERD -Continue Protonix  Anxiety/insomnia: On Zoloft, Xanax and temazepam.  Not a great combination. polypharmacy. -Reduce temazepam to 15 mg -Continue home Zoloft and Xanax for now  Generalized deconditioning -Therapy recommended SNF.  Constipation -As needed MiraLAX, Senokot-S and/or magnesium citrate based on severity  Possible urinary retention?  Some concern about this but voided about 600 cc / 24 hours. -Bethanechol 25 mg once followed by 10 mg 3 times daily -Closely monitor UOP -Intermittent bladder scan   Body mass index is 21.68 kg/m.         DVT prophylaxis:  apixaban (ELIQUIS) tablet 2.5 mg Start: 06/28/20 1000 apixaban (ELIQUIS) tablet 2.5 mg  Code Status: DNR/DNI Family Communication: Updated patient's daughter over the phone. Status is: Inpatient  Remains inpatient appropriate because:Ongoing diagnostic testing needed not appropriate for outpatient work up, Unsafe d/c plan and Inpatient level of care appropriate due to severity of illness   Dispo: The patient is from: Home              Anticipated d/c is to: SNF              Anticipated d/c date is: 2 days              Patient currently is not medically stable to d/c.       Consultants:  Interventional radiology   Sch Meds:  Scheduled Meds: . apixaban  2.5 mg Oral BID  . bethanechol  10 mg Oral TID  . furosemide  20 mg Oral Daily  . irbesartan  150 mg Oral Daily  . levothyroxine  100 mcg Oral Q0600  . melatonin  3 mg Oral QHS  . metoprolol succinate  50 mg  Oral QPM  . pantoprazole  40 mg Oral Daily  . sertraline  50 mg Oral q AM  . sodium chloride flush  3 mL Intravenous Q12H  . sodium chloride  1 g Oral Daily  . temazepam  30 mg Oral QHS   Continuous Infusions: PRN Meds:.acetaminophen **OR** acetaminophen, ALPRAZolam, hydrocortisone, magnesium citrate, ondansetron **OR** ondansetron (ZOFRAN) IV, polyethylene glycol, Resource ThickenUp Clear, senna-docusate  Antimicrobials: Anti-infectives (From admission, onward)   Start     Dose/Rate Route Frequency Ordered Stop   07/01/20 1400  Ampicillin-Sulbactam (UNASYN) 3 g in sodium chloride 0.9 % 100 mL IVPB        3 g 200 mL/hr over 30 Minutes Intravenous Every 8 hours 07/01/20 1250 07/07/20 2359   06/30/20 1145  amoxicillin-clavulanate (AUGMENTIN) 875-125 MG per tablet 1 tablet  Status:  Discontinued        1 tablet Oral Every 12 hours 06/30/20 1055 07/01/20 1138       I have personally reviewed the following labs and images: CBC: Recent Labs  Lab 07/03/20 0254 07/04/20 0832 07/05/20 0248 07/08/20 0210 07/09/20 0058  WBC 11.4* 8.3 8.5 8.0 9.0  NEUTROABS 9.3* 6.1 6.9 6.6  --  HGB 13.2 12.5 11.3* 10.6* 11.1*  HCT 39.3 37.9 34.9* 34.4* 35.9*  MCV 87.1 89.0 89.9 97.2 96.5  PLT 170 149* 171 162 167   BMP &GFR Recent Labs  Lab 07/05/20 0248 07/06/20 0253 07/07/20 0159 07/08/20 0210 07/09/20 0058  NA 134* 135 135 135 132*  K 3.2* 4.0 4.5 4.7 5.2*  CL 82* 85* 85* 85* 84*  CO2 44* 39* 41* 39* 42*  GLUCOSE 112* 107* 130* 116* 106*  BUN 19 20 22 22 17   CREATININE 0.63 0.65 0.53 0.53 0.51  CALCIUM 8.7* 8.8* 9.2 9.5 9.4  MG 1.6* 2.2 2.1 2.0 1.9  PHOS  --   --   --   --  4.4   Estimated Creatinine Clearance: 50 mL/min (by C-G formula based on SCr of 0.51 mg/dL). Liver & Pancreas: Recent Labs  Lab 07/03/20 0254 07/09/20 0058  AST 21  --   ALT 16  --   ALKPHOS 79  --   BILITOT 1.3*  --   PROT 5.7*  --   ALBUMIN 2.6* 2.2*   No results for input(s): LIPASE, AMYLASE in the  last 168 hours. No results for input(s): AMMONIA in the last 168 hours. Diabetic: No results for input(s): HGBA1C in the last 72 hours. No results for input(s): GLUCAP in the last 168 hours. Cardiac Enzymes: No results for input(s): CKTOTAL, CKMB, CKMBINDEX, TROPONINI in the last 168 hours. No results for input(s): PROBNP in the last 8760 hours. Coagulation Profile: No results for input(s): INR, PROTIME in the last 168 hours. Thyroid Function Tests: No results for input(s): TSH, T4TOTAL, FREET4, T3FREE, THYROIDAB in the last 72 hours. Lipid Profile: No results for input(s): CHOL, HDL, LDLCALC, TRIG, CHOLHDL, LDLDIRECT in the last 72 hours. Anemia Panel: No results for input(s): VITAMINB12, FOLATE, FERRITIN, TIBC, IRON, RETICCTPCT in the last 72 hours. Urine analysis:    Component Value Date/Time   COLORURINE YELLOW 11/17/2019 1253   APPEARANCEUR CLOUDY (A) 11/17/2019 1253   LABSPEC 1.006 11/17/2019 1253   PHURINE 8.0 11/17/2019 1253   GLUCOSEU NEGATIVE 11/17/2019 1253   HGBUR NEGATIVE 11/17/2019 1253   BILIRUBINUR NEGATIVE 11/17/2019 1253   KETONESUR NEGATIVE 11/17/2019 1253   PROTEINUR NEGATIVE 11/17/2019 1253   UROBILINOGEN 1.0 07/22/2007 2239   NITRITE NEGATIVE 11/17/2019 1253   LEUKOCYTESUR NEGATIVE 11/17/2019 1253   Sepsis Labs: Invalid input(s): PROCALCITONIN, LACTICIDVEN  Microbiology: Recent Results (from the past 240 hour(s))  Respiratory Panel by PCR     Status: None   Collection Time: 06/29/20  3:29 PM   Specimen: Nasopharyngeal Swab; Respiratory  Result Value Ref Range Status   Adenovirus NOT DETECTED NOT DETECTED Final   Coronavirus 229E NOT DETECTED NOT DETECTED Final    Comment: (NOTE) The Coronavirus on the Respiratory Panel, DOES NOT test for the novel  Coronavirus (2019 nCoV)    Coronavirus HKU1 NOT DETECTED NOT DETECTED Final   Coronavirus NL63 NOT DETECTED NOT DETECTED Final   Coronavirus OC43 NOT DETECTED NOT DETECTED Final   Metapneumovirus NOT  DETECTED NOT DETECTED Final   Rhinovirus / Enterovirus NOT DETECTED NOT DETECTED Final   Influenza A NOT DETECTED NOT DETECTED Final   Influenza B NOT DETECTED NOT DETECTED Final   Parainfluenza Virus 1 NOT DETECTED NOT DETECTED Final   Parainfluenza Virus 2 NOT DETECTED NOT DETECTED Final   Parainfluenza Virus 3 NOT DETECTED NOT DETECTED Final   Parainfluenza Virus 4 NOT DETECTED NOT DETECTED Final   Respiratory Syncytial Virus NOT DETECTED NOT DETECTED Final  Bordetella pertussis NOT DETECTED NOT DETECTED Final   Chlamydophila pneumoniae NOT DETECTED NOT DETECTED Final   Mycoplasma pneumoniae NOT DETECTED NOT DETECTED Final    Comment: Performed at [name], [address], [place], [place] [address]  SARS Coronavirus 2 by RT PCR (hospital order, performed in [facility] hospital lab) Nasopharyngeal Nasopharyngeal Swab     Status: None   Collection Time: 07/07/20  4:35 PM   Specimen: Nasopharyngeal Swab  Result Value Ref Range Status   SARS Coronavirus 2 NEGATIVE NEGATIVE Final    Comment: (NOTE) SARS-CoV-2 target nucleic acids are NOT DETECTED.  The SARS-CoV-2 RNA is generally detectable in upper and lower respiratory specimens during the acute phase of infection. The lowest concentration of SARS-CoV-2 viral copies this assay can detect is 250 copies / mL. A negative result does not preclude SARS-CoV-2 infection and should not be used as the sole basis for treatment or other patient management decisions.  A negative result may occur with improper specimen collection / handling, submission of specimen other than nasopharyngeal swab, presence of viral mutation(s) within the areas targeted by this assay, and inadequate number of viral copies (<250 copies / mL). A negative result must be combined with clinical observations, patient history, and epidemiological information.  Fact Sheet for Patients:   BoilerBrush.com.cy  Fact Sheet for Healthcare  Providers: https://pope.com/  This test is not yet approved or  cleared by the [place] FDA and has been authorized for detection and/or diagnosis of SARS-CoV-2 by FDA under an Emergency Use Authorization (EUA).  This EUA will remain in effect (meaning this test can be used) for the duration of the COVID-19 declaration under Section 564(b)(1) of the Act, 21 U.S.C. section 360bbb-3(b)(1), unless the authorization is terminated or revoked sooner.  Performed at [facility], [address] [place], [place] [address]   Gram stain     Status: None   Collection Time: 07/08/20  8:45 AM   Specimen: Lung, Right; Pleural Fluid  Result Value Ref Range Status   Specimen Description PLEURAL FLUID  Final   Special Requests RIGHT LUNG  Final   Gram Stain   Final    WBC PRESENT,BOTH PMN AND MONONUCLEAR NO ORGANISMS SEEN CYTOSPIN SMEAR Performed at [facility], [address], [place], [place] [address]    Report Status 07/08/2020 FINAL  Final  Acid Fast Smear (AFB)     Status: None   Collection Time: 07/08/20  8:45 AM   Specimen: Lung, Right; Pleural Fluid  Result Value Ref Range Status   AFB Specimen Processing Concentration  Final   Acid Fast Smear Negative  Final    Comment: (NOTE) Performed At: [facility] [address] [place], [place] [id number] Jolene Schimke MD OZ:[id number]    Source (AFB) RIGHT LUNG  Final    Comment: Performed at [facility], [address] [place], [place] [address]  Culture, body fluid-bottle     Status: None (Preliminary result)   Collection Time: 07/08/20  8:45 AM   Specimen: Pleura  Result Value Ref Range Status   Specimen Description PLEURAL FLUID  Final   Special Requests RIGHT LUNG  Final   Culture   Final    NO GROWTH < 24 HOURS Performed at [facility], [address], [place], [place] [address]    Report Status PENDING  Incomplete    Radiology Studies: No results  found.   Damarea Merkel T. [facility]  If 7PM-7AM, please contact night-coverage [email or web address] 07/09/2020, 1:18 PM

## 2020-07-09 NOTE — Progress Notes (Signed)
Pt continues to complain of need to have BM. No results from dulcolax suppository. Pt did however void 200cc amber urine while on BSC. Digital exam preformed no impaction noted-only soft stool felt in rectum. Will give pt PRN Miralax with am meds. Dierdre Highman, RN

## 2020-07-10 ENCOUNTER — Inpatient Hospital Stay (HOSPITAL_COMMUNITY): Payer: Medicare Other

## 2020-07-10 DIAGNOSIS — J690 Pneumonitis due to inhalation of food and vomit: Secondary | ICD-10-CM | POA: Diagnosis not present

## 2020-07-10 DIAGNOSIS — I5033 Acute on chronic diastolic (congestive) heart failure: Secondary | ICD-10-CM | POA: Diagnosis not present

## 2020-07-10 DIAGNOSIS — J9601 Acute respiratory failure with hypoxia: Secondary | ICD-10-CM | POA: Diagnosis not present

## 2020-07-10 DIAGNOSIS — G9341 Metabolic encephalopathy: Secondary | ICD-10-CM

## 2020-07-10 DIAGNOSIS — J90 Pleural effusion, not elsewhere classified: Secondary | ICD-10-CM | POA: Diagnosis not present

## 2020-07-10 HISTORY — PX: IR THORACENTESIS ASP PLEURAL SPACE W/IMG GUIDE: IMG5380

## 2020-07-10 LAB — RENAL FUNCTION PANEL
Albumin: 2.3 g/dL — ABNORMAL LOW (ref 3.5–5.0)
Albumin: 2.4 g/dL — ABNORMAL LOW (ref 3.5–5.0)
Anion gap: 6 (ref 5–15)
Anion gap: 12 (ref 5–15)
BUN: 17 mg/dL (ref 8–23)
BUN: 15 mg/dL (ref 8–23)
CO2: 45 mmol/L — ABNORMAL HIGH (ref 22–32)
CO2: 45 mmol/L — ABNORMAL HIGH (ref 22–32)
Calcium: 9.9 mg/dL (ref 8.9–10.3)
Calcium: 9.8 mg/dL (ref 8.9–10.3)
Chloride: 80 mmol/L — ABNORMAL LOW (ref 98–111)
Chloride: 80 mmol/L — ABNORMAL LOW (ref 98–111)
Creatinine, Ser: 0.56 mg/dL (ref 0.44–1.00)
Creatinine, Ser: 0.67 mg/dL (ref 0.44–1.00)
GFR, Estimated: >60 mL/min (ref >60)
GFR, Estimated: >60 mL/min (ref >60)
Glucose, Bld: 130 mg/dL — ABNORMAL HIGH (ref 70–99)
Glucose, Bld: 126 mg/dL — ABNORMAL HIGH (ref 70–99)
Phosphorus: 4.9 mg/dL — ABNORMAL HIGH (ref 2.5–4.6)
Phosphorus: 3.7 mg/dL (ref 2.5–4.6)
Potassium: 5.5 mmol/L — ABNORMAL HIGH (ref 3.5–5.1)
Potassium: 4.7 mmol/L (ref 3.5–5.1)
Sodium: 131 mmol/L — ABNORMAL LOW (ref 135–145)
Sodium: 137 mmol/L (ref 135–145)

## 2020-07-10 LAB — AMMONIA: Ammonia: 21 umol/L (ref 9–35)

## 2020-07-10 LAB — MAGNESIUM: Magnesium: 1.7 mg/dL (ref 1.7–2.4)

## 2020-07-10 LAB — BLOOD GAS, ARTERIAL
Acid-Base Excess: 18.9 mmol/L — ABNORMAL HIGH (ref 0.0–2.0)
Bicarbonate: 45 mmol/L — ABNORMAL HIGH (ref 20.0–28.0)
Drawn by: 53309
FIO2: 28
O2 Saturation: 83.6 %
Patient temperature: 36.4
pCO2 arterial: 72.6 mmHg (ref 32.0–48.0)
pH, Arterial: 7.406 (ref 7.350–7.450)
pO2, Arterial: 44.8 mmHg — ABNORMAL LOW (ref 83.0–108.0)

## 2020-07-10 LAB — CBC
HCT: 41 % (ref 36.0–46.0)
Hemoglobin: 12.7 g/dL (ref 12.0–15.0)
MCH: 30 pg (ref 26.0–34.0)
MCHC: 31 g/dL (ref 30.0–36.0)
MCV: 96.7 fL (ref 80.0–100.0)
Platelets: 178 10*3/uL (ref 150–400)
RBC: 4.24 MIL/uL (ref 3.87–5.11)
RDW: 13.1 % (ref 11.5–15.5)
WBC: 10.4 10*3/uL (ref 4.0–10.5)
nRBC: 0 % (ref 0.0–0.2)

## 2020-07-10 LAB — PH, BODY FLUID: pH, Body Fluid: 7.6

## 2020-07-10 MED ORDER — LIDOCAINE HCL (PF) 1 % IJ SOLN
INTRAMUSCULAR | Status: DC | PRN
  Administered 2020-07-10: 10 mL

## 2020-07-10 MED ORDER — FUROSEMIDE 10 MG/ML IJ SOLN
20.0000 mg | Freq: Every day | INTRAMUSCULAR | Status: DC
  Administered 2020-07-10 – 2020-07-11 (×2): 20 mg via INTRAVENOUS
  Filled 2020-07-10 (×2): qty 2

## 2020-07-10 MED ORDER — SODIUM ZIRCONIUM CYCLOSILICATE 10 G PO PACK
10.0000 g | PACK | Freq: Once | ORAL | Status: AC
  Administered 2020-07-10: 10 g via ORAL
  Filled 2020-07-10: qty 1

## 2020-07-10 MED ORDER — ACETAZOLAMIDE 250 MG PO TABS
250.0000 mg | ORAL_TABLET | Freq: Two times a day (BID) | ORAL | Status: DC
  Administered 2020-07-10 (×2): 250 mg via ORAL
  Filled 2020-07-10 (×5): qty 1

## 2020-07-10 MED ORDER — ALPRAZOLAM 0.25 MG PO TABS: 0.2500 mg | ORAL_TABLET | Freq: Three times a day (TID) | ORAL | Status: DC | PRN

## 2020-07-10 MED ORDER — LIDOCAINE HCL 1 % IJ SOLN
INTRAMUSCULAR | Status: AC
  Filled 2020-07-10: qty 20

## 2020-07-10 MED ORDER — MAGNESIUM SULFATE 2 GM/50ML IV SOLN
2.0000 g | Freq: Once | INTRAVENOUS | Status: AC
  Administered 2020-07-10: 2 g via INTRAVENOUS
  Filled 2020-07-10: qty 50

## 2020-07-10 NOTE — Progress Notes (Signed)
ABG mixed venous D/T spo2 98 Results called to Candelaria Stagers T, MD. No changes at this time.

## 2020-07-10 NOTE — Progress Notes (Signed)
Critical level sent to Dr. Alanda Slim PCO2 72.6. He is aware

## 2020-07-10 NOTE — Care Management Important Message (Signed)
Important Message  Patient Details  Name: [name] MRN: [id number] Date of Birth: Jan 08, 1931   Medicare Important Message Given:  Yes     Renie Ora 07/10/2020, 8:35 AM

## 2020-07-10 NOTE — Procedures (Signed)
PROCEDURE SUMMARY:  Successful US guided right thoracentesis. Yielded 850 mL of yellow fluid. Pt tolerated procedure well. No immediate complications.  Specimen was not sent for labs. CXR ordered. O2 sats 100% at completion of procedure.  EBL < 5 mL  Hoyt Koch PA-C 07/10/2020 8:30 AM

## 2020-07-10 NOTE — TOC Progression Note (Deleted)
Transition of Care [facility]) - Progression Note    Patient Details  Name: [name] MRN: [id number] Date of Birth: Jan 20, 1931  Transition of Care [facility]) CM/SW Contact  Levada Schilling Phone Number: 07/10/2020, 12:24 PM  Clinical Narrative:     CSW spoke with Selena Batten at [facility].  According to Selena Batten at [facility] will not return to facility until Monday, November 29th.  CSW will continue to assit for disposition planning.  Expected Discharge Plan: Skilled Nursing Facility Barriers to Discharge: Continued Medical Work up  Expected Discharge Plan and Services Expected Discharge Plan: Skilled Nursing Facility   Discharge Planning Services: CM Consult Post Acute Care Choice: Home Health Living arrangements for the past 2 months: Single Family Home                 DME Arranged: N/A DME Agency: NA       HH Arranged: RN, PT, OT HH Agency: [facility] Date [facility] Agency Contacted: 07/01/20 Time HH Agency Contacted: 1601 Representative spoke with at [facility] Agency: Lorenza Chick   Social Determinants of Health (SDOH) Interventions    Readmission Risk Interventions No flowsheet data found.

## 2020-07-10 NOTE — Progress Notes (Signed)
Oxygen titrated down to 4L min on nasal cannula.

## 2020-07-10 NOTE — Progress Notes (Signed)
Occupational Therapy Treatment Patient Details Name: [name] MRN: [id number] DOB: Sep 03, 1930 Today's Date: 07/10/2020    History of present illness This 55 admitted with SOB and cough.  Dx:  acute respiratory failrue with hypoxia, acute on chronic diastolic CHF, pleural effusions, hypnatremia, paroxysmal A-Fib. Pt may also have PNA.  PMH includes: unspecified tinnitus, hereditary ideopathic peripheral neuropathy, HTN, senile osteoporosis, OA, macular degeneration, diaphragmatic hernia, anxiety    OT comments  Patient with noted increased confusion this date.  She has undergone Left thoracocentesis on 11/24 and a right  thoracocentesis 11/26.  Decreased command following and increased processing time noted.  OT session limited to sit to stand and mobility around the foot of the bed to lie down.  Patient was scheduled to return to her home, but unclear about the time frame.  OT will continue to follow in the acute setting.    Follow Up Recommendations  SNF;Supervision/Assistance - 24 hour    Equipment Recommendations  3 in 1 bedside commode;Tub/shower seat    Recommendations for Other Services      Precautions / Restrictions Precautions Precautions: Fall Precaution Comments: poor vision       Mobility Bed Mobility Overal bed mobility: Needs Assistance Bed Mobility: Sit to Supine     Supine to sit: HOB elevated;Min assist Sit to supine: Min assist   General bed mobility comments: Assist to elevate trunk into sitting  Transfers Overall transfer level: Needs assistance Equipment used: 4-wheeled walker Transfers: Sit to/from [employer] Sit to Stand: Min assist Stand pivot transfers: Min assist       General transfer comment: Assist to bring hips up and for balance. Bed to bsc to bed with stand pivot    Balance Overall balance assessment: Needs assistance Sitting-balance support: No upper extremity supported;Feet supported Sitting balance-Leahy  Scale: Fair     Standing balance support: Bilateral upper extremity supported Standing balance-[name]: Poor Standing balance comment: rollator and min guard for static standing                                                Cognition Arousal/Alertness: Awake/alert Behavior During Therapy: WFL for tasks assessed/performed Overall Cognitive Status: Impaired/Different from baseline Area of Impairment: Attention;Following commands;Problem solving                 Orientation Level: Disoriented to;Place;Time;Situation Current Attention Level: Focused Memory: Decreased short-term memory Following Commands: Follows multi-step commands inconsistently     Problem Solving: Slow processing;Difficulty sequencing General Comments: patient status post Left thoracocentesis 11/24 and Right thoracocentesis 11/26 - she complains of being being dreamy headed.                    General Comments Pt on 4L of O2.     Pertinent Vitals/ Pain       Pain Assessment: No/denies pain  Home Living                                                        Frequency  Min 2X/week        Progress Toward Goals  OT Goals(current goals can now be found in the care plan section)  Progress towards OT goals: Not progressing toward goals - comment (Patient with increased confusion this date status post 2 procedures in 3 days.)  Acute Rehab OT Goals Patient Stated Goal: Hoping to return home OT Goal Formulation: With patient Time For Goal Achievement: 07/26/20 Potential to Achieve Goals: Good  Plan Discharge plan remains appropriate    Co-evaluation                 AM-PAC OT "6 Clicks" Daily Activity     Outcome Measure   Help from another person eating meals?: A Little Help from another person taking care of personal grooming?: A Little Help from another person toileting, which includes using toliet, bedpan, or urinal?: A  Little Help from another person bathing (including washing, rinsing, drying)?: A Lot Help from another person to put on and taking off regular upper body clothing?: A Lot Help from another person to put on and taking off regular lower body clothing?: A Lot 6 Click Score: 15    End of Session Equipment Utilized During Treatment: Rolling walker;Oxygen  OT Visit Diagnosis: Low vision, both eyes (H54.2);Unsteadiness on feet (R26.81);Dizziness and giddiness (R42);Muscle weakness (generalized) (M62.81)   Activity Tolerance Other (comment) (limited by decreased cognition this date)   Patient Left in bed;with call bell/phone within reach;with bed alarm set;with family/visitor present   Nurse Communication Mobility status        Time: 4650-3546 OT Time Calculation (min): 18 min  Charges: OT General Charges $OT Visit: 1 Visit OT Treatments $Therapeutic Activity: 8-22 mins  07/10/2020  [name], OTR/L  Acute Rehabilitation Services  Office:  [phone]    [name] 07/10/2020, 5:27 PM

## 2020-07-10 NOTE — Progress Notes (Addendum)
Patient maintaining oxygen saturation at 95 on 5L Oxygen titrated down to 5L via nasal cannula.

## 2020-07-10 NOTE — Progress Notes (Signed)
Physical Therapy Treatment Patient Details Name: [name] MRN: [id number] DOB: [birth year] Today's Date: 07/10/2020    History of Present Illness This 54 admitted with SOB and cough.  Dx:  acute respiratory failrue with hypoxia, acute on chronic diastolic CHF, pleural effusions, hypnatremia, paroxysmal A-Fib. Pt may also have PNA.  PMH includes: unspecified tinnitus, hereditary ideopathic peripheral neuropathy, HTN, senile osteoporosis, OA, macular degeneration, diaphragmatic hernia, anxiety     PT Comments    Pt very confused this PM. Thinks we are playing a sorority trick on her and that she is at a wedding place. Disoriented to place, time, situation. Also was hallucinating that there was a hand waving in the room. Pt urinated on BSC and dime size bright red blood present - unsure of source. Nursing informed of above. Pt continues to be very weak and now confused. Continue to recommend SNF.    Follow Up Recommendations  SNF     Equipment Recommendations  Wheelchair (measurements PT)    Recommendations for Other Services       Precautions / Restrictions Precautions Precautions: Fall Precaution Comments: poor vision    Mobility  Bed Mobility Overal bed mobility: Needs Assistance Bed Mobility: Supine to Sit     Supine to sit: HOB elevated;Min assist     General bed mobility comments: Assist to elevate trunk into sitting  Transfers Overall transfer level: Needs assistance Equipment used: 4-wheeled walker Transfers: Sit to/from [employer] Sit to Stand: Min assist Stand pivot transfers: Min assist       General transfer comment: Assist to bring hips up and for balance. Bed to bsc to bed with stand pivot  Ambulation/Gait Ambulation/Gait assistance: Min assist Gait Distance (Feet): 15 Feet Assistive device: 4-wheeled walker Gait Pattern/deviations: Step-through pattern;Decreased step length - right;Decreased step length - left;Shuffle;Trunk  flexed Gait velocity: decr Gait velocity interpretation: <1.31 ft/sec, indicative of household ambulator General Gait Details: Assist for balance and support. Assist to guide and steer walker. Verbal cues to stand more erect.    Stairs             Wheelchair Mobility    Modified Rankin (Stroke Patients Only)       Balance Overall balance assessment: Needs assistance Sitting-balance support: No upper extremity supported;Feet supported Sitting balance-[name]: Fair     Standing balance support: Bilateral upper extremity supported Standing balance-[name]: Poor Standing balance comment: rollator and min guard for static standing                            Cognition Arousal/Alertness: Awake/alert Behavior During Therapy: WFL for tasks assessed/performed Overall Cognitive Status: Impaired/Different from baseline Area of Impairment: Attention;Following commands;Problem solving                 Orientation Level: Disoriented to;Place;Time;Situation Current Attention Level: Focused Memory: Decreased short-term memory Following Commands: Follows multi-step commands inconsistently     Problem Solving: Slow processing;Difficulty sequencing General Comments: patient status post      Exercises      General Comments General comments (skin integrity, edema, etc.): Pt on 4L of O2. Very difficult to get SpO2 reading with poor pleth. After amb moved sensor to earlobe and initially got 99% reading with good pleth.  Shortly after unable to get a reading      Pertinent Vitals/Pain Pain Assessment: No/denies pain    Home Living  Prior Function            PT Goals (current goals can now be found in the care plan section) Acute Rehab PT Goals Patient Stated Goal: Hoping to return home Progress towards PT goals: Not progressing toward goals - comment    Frequency    Min 2X/week      PT Plan Current plan remains  appropriate;Frequency needs to be updated    Co-evaluation              AM-PAC PT "6 Clicks" Mobility   Outcome Measure  Help needed turning from your back to your side while in a flat bed without using bedrails?: A Little Help needed moving from lying on your back to sitting on the side of a flat bed without using bedrails?: A Lot Help needed moving to and from a bed to a chair (including a wheelchair)?: A Little Help needed standing up from a chair using your arms (e.g., wheelchair or bedside chair)?: A Little Help needed to walk in hospital room?: A Little Help needed climbing 3-5 steps with a railing? : Total 6 Click Score: 15    End of Session Equipment Utilized During Treatment: Oxygen;Gait belt Activity Tolerance: Patient limited by fatigue Patient left: with call bell/phone within reach;in chair;with chair alarm set Nurse Communication: Mobility status;Other (comment) (confusion, hallucination, blood in bsc) PT Visit Diagnosis: Unsteadiness on feet (R26.81);Muscle weakness (generalized) (M62.81);Other abnormalities of gait and mobility (R26.89)     Time: 1550-1620 PT Time Calculation (min) (ACUTE ONLY): 30 min  Charges:  $Gait Training: 23-37 mins                     [facility] PT Acute Rehabilitation Services Pager [phone] Office [phone]    Angelina Ok [facility] 07/10/2020, 5:22 PM

## 2020-07-10 NOTE — Progress Notes (Signed)
PROGRESS NOTE  [name]:[id number] DOB: 09/30/1930   PCP: Kermit Balo, DO  Patient is from: Home  DOA: 07/08/2020 LOS: 12  Chief complaints: Shortness of breath  Brief Narrative / Interim history: 86 year old female with history of paroxysmal A. fib on Eliquis, diastolic CHF, HTN, hypothyroidism, anxiety and GERD presenting with shortness of breath and admitted for acute on chronic diastolic CHF, possible aspiration pneumonia and hyponatremia.  She was treated with IV Lasix and transition to p.o. Lasix.  She also completed antibiotic course for 7 days for aspiration pneumonia.  However, she continued to require significant oxygen.  CXR obtained on 07/07/2020 and showed a moderately large pleural effusion bilaterally.  Patient had bilateral thoracocentesis with removal of 750 cc from left and 850 cc from right.  Pleural fluid was transudative and culture negative.  Patient continued to have intermittent respiratory distress.  IV Lasix resumed with improvement in her oxygenation and symptoms.  SLP recommended dysphagia 3 diet.  Therapy recommended SNF likely over the weekend or early next week  Subjective: Seen and examined earlier this morning after she returned from thoracocentesis.  She is somewhat sleepy but wakes to voice.  Not quite alert but oriented to self, place and date but not the month.  Reports feeling weak, sleepy and dreaming.  She says her breathing is on and off.  Denies chest pain, GI or UTI symptoms.  Objective: Vitals:   07/10/20 0747 07/10/20 0808 07/10/20 0852 07/10/20 0900  BP: (!) 148/68 (!) 148/68 125/86   Pulse: (!) 115  88   Resp: (!) 23  (!) 23 20  Temp:      TempSrc:      SpO2: 92%  98% 98%  Weight:      Height:        Intake/Output Summary (Last 24 hours) at 07/10/2020 1149 Last data filed at 07/10/2020 0827 Gross per 24 hour  Intake --  Output 1350 ml  Net -1350 ml   Filed Weights   07/07/20 0327 07/08/20 0432 07/09/20 0522   Weight: 62.8 kg 57.5 kg 64.7 kg    Examination:  GENERAL: Frail looking elderly female. HEENT: MMM.  Vision and hearing grossly intact.  NECK: Supple.  No apparent JVD.  RESP: 95% on 5 L.  No IWOB.  Fair aeration with rhonchi bilaterally. CVS:  RRR. Heart sounds normal.  ABD/GI/GU: BS+. Abd soft, NTND.  MSK/EXT:  Moves extremities. No apparent deformity.  Trace edema bilaterally. SKIN: no apparent skin lesion or wound NEURO: Sleepy but wakes to voice.  Somewhat groggy.  Oriented to self, place and date but not month.  No apparent focal neuro deficit. PSYCH: Calm.  But groggy.  Procedures:  07/08/2020-left-sided thoracocentesis with removal of 750 cc transudative fluid  Microbiology summarized: COVID-19 and influenza PCR negative. Follow RVP panel negative. Pleural fluid culture negative.  Assessment & Plan: Acute hypoxic respiratory failure: Multifactorial including CHF exacerbation, pneumonia and pleural effusion. Continues to have intermittent respiratory distress with increased oxygen requirement.  Currently on 5 L -Treat treatable causes as below -Wean oxygen as able-minimum oxygen to keep saturation above 90% -Encourage incentive spirometry, OOB, PT/OT  Possible community-acquired pneumonia/aspiration pneumonia: CT chest showed multifocal opacities. -Completed 7 days of antibiotic course with IV Unasyn and Augmentin on 11/23. -Dysphagia-3 diet per SLP recommendation.  Acute on chronic diastolic heart failure: TTE on 11/14 with EF of 60 to 65%, RVSP of 51, severe LAE, severe RAE. Diuresed with IV Lasix and transitioned to p.o. However,  developed respiratory distress and restarted on IV Lasix.  500 cc UOP/24 hours charted.  She will also have metabolic alkalosis -IV Lasix 20 mg daily -Added Diamox for metabolic alkalosis -Monitor fluid status, renal function and electrolytes.   Moderate bilateral pleural effusion-S/p bilateral thoracocentesis with removal of 750 cc from  left and 850 cc from right.  Left pleural fluid transudative and culture negative.  -IV diuretics as above.  Acute metabolic encephalopathy:  She wakes to voice but not quite alert this morning.  She is oriented to self, place and date but not month.  No apparent focal neuro deficit.  She has mild metabolic alkalosis.  Not sure if this is due to diuretics or CO2 retention.  She is also on Xanax and temazepam although she has not been getting them consistently -Discontinue temazepam -Reduce Xanax to 0.25 mg 3 times daily as needed anxiety -Reorientation and delirium precautions -May consider ABG if no improvement in mental status.  Paroxysmal A. Fib: Rate controlled. -Continue metoprolol and Eliquis -Outpatient follow-up with cardiology  Hyponatremia: Na 131 -Continue p.o. sodium chloride -Discontinued ARB  Mild hyperkalemia: K5.5.  Unclear etiology.  Due to ARB? -Lokelma x1 -Recheck in the afternoon  Metabolic alkalosis: Contraction alkalosis?  CO2 retention?  -Wean oxygen as able. -Diamox as above. -ABG if no improvement  Hypothyroidism -Continue Synthroid  Essential hypertension: Normotensive for most part. -Continue metoprolol and Lasix -Discontinue irbesartan, amlodipine and HCTZ  GERD -Continue Protonix  Anxiety/insomnia: On Zoloft, Xanax and temazepam.  Not a great combination. polypharmacy. -Stop temazepam and reduce Xanax as above -Continue home Zoloft  Generalized deconditioning -Therapy recommended SNF.  Constipation -As needed MiraLAX, Senokot-S and/or magnesium citrate based on severity  Possible urinary retention?  Some concern about this but voided about 600 cc / 24 hours. -Continue bethanechol 10 mg 3 times daily -Closely monitor UOP -Intermittent bladder scan   Body mass index is 21.68 kg/m.         DVT prophylaxis:  apixaban (ELIQUIS) tablet 2.5 mg Start: 06/28/20 1000 apixaban (ELIQUIS) tablet 2.5 mg  Code Status: DNR/DNI Family  Communication: Updated patient's daughter over the phone on 11/25. Status is: Inpatient  Remains inpatient appropriate because:Persistent severe electrolyte disturbances, Altered mental status, Unsafe d/c plan, IV treatments appropriate due to intensity of illness or inability to take PO and Inpatient level of care appropriate due to severity of illness   Dispo: The patient is from: Home              Anticipated d/c is to: SNF              Anticipated d/c date is: 2 days              Patient currently is not medically stable to d/c.       Consultants:  Interventional radiology   Sch Meds:  Scheduled Meds: . acetaZOLAMIDE  250 mg Oral BID  . apixaban  2.5 mg Oral BID  . bethanechol  10 mg Oral TID  . furosemide  20 mg Intravenous Daily  . levothyroxine  100 mcg Oral Q0600  . lidocaine      . melatonin  3 mg Oral QHS  . metoprolol succinate  50 mg Oral QPM  . pantoprazole  40 mg Oral Daily  . sertraline  50 mg Oral q AM  . sodium chloride flush  3 mL Intravenous Q12H  . sodium chloride  1 g Oral Daily   Continuous Infusions: PRN Meds:.acetaminophen **OR** acetaminophen, ALPRAZolam,  hydrocortisone, lidocaine (PF), magnesium citrate, ondansetron **OR** ondansetron (ZOFRAN) IV, polyethylene glycol, Resource ThickenUp Clear, senna-docusate  Antimicrobials: Anti-infectives (From admission, onward)   Start     Dose/Rate Route Frequency Ordered Stop   07/01/20 1400  Ampicillin-Sulbactam (UNASYN) 3 g in sodium chloride 0.9 % 100 mL IVPB        3 g 200 mL/hr over 30 Minutes Intravenous Every 8 hours 07/01/20 1250 07/07/20 2359   06/30/20 1145  amoxicillin-clavulanate (AUGMENTIN) 875-125 MG per tablet 1 tablet  Status:  Discontinued        1 tablet Oral Every 12 hours 06/30/20 1055 07/01/20 1138       I have personally reviewed the following labs and images: CBC: Recent Labs  Lab 07/04/20 0832 07/05/20 0248 07/08/20 0210 07/09/20 0058 07/10/20 0231  WBC 8.3 8.5 8.0 9.0  10.4  NEUTROABS 6.1 6.9 6.6  --   --   HGB 12.5 11.3* 10.6* 11.1* 12.7  HCT 37.9 34.9* 34.4* 35.9* 41.0  MCV 89.0 89.9 97.2 96.5 96.7  PLT 149* 171 162 167 178   BMP &GFR Recent Labs  Lab 07/06/20 0253 07/06/20 0253 07/07/20 0159 07/08/20 0210 07/09/20 0058 07/09/20 1620 07/10/20 0231  NA 135   < > 135 135 132* 130* 131*  K 4.0   < > 4.5 4.7 5.2* 5.2* 5.5*  CL 85*   < > 85* 85* 84* 84* 80*  CO2 39*   < > 41* 39* 42* 38* 45*  GLUCOSE 107*   < > 130* 116* 106* 197* 130*  BUN 20   < > 22 22 17 18 17   CREATININE 0.65   < > 0.53 0.53 0.51 0.49 0.56  CALCIUM 8.8*   < > 9.2 9.5 9.4 9.7 9.9  MG 2.2  --  2.1 2.0 1.9  --  1.7  PHOS  --   --   --   --  4.4 4.4 4.9*   < > = values in this interval not displayed.   Estimated Creatinine Clearance: 50 mL/min (by C-G formula based on SCr of 0.56 mg/dL). Liver & Pancreas: Recent Labs  Lab 07/09/20 0058 07/09/20 1620 07/10/20 0231  ALBUMIN 2.2* 2.5* 2.3*   No results for input(s): LIPASE, AMYLASE in the last 168 hours. No results for input(s): AMMONIA in the last 168 hours. Diabetic: No results for input(s): HGBA1C in the last 72 hours. No results for input(s): GLUCAP in the last 168 hours. Cardiac Enzymes: No results for input(s): CKTOTAL, CKMB, CKMBINDEX, TROPONINI in the last 168 hours. No results for input(s): PROBNP in the last 8760 hours. Coagulation Profile: No results for input(s): INR, PROTIME in the last 168 hours. Thyroid Function Tests: No results for input(s): TSH, T4TOTAL, FREET4, T3FREE, THYROIDAB in the last 72 hours. Lipid Profile: No results for input(s): CHOL, HDL, LDLCALC, TRIG, CHOLHDL, LDLDIRECT in the last 72 hours. Anemia Panel: No results for input(s): VITAMINB12, FOLATE, FERRITIN, TIBC, IRON, RETICCTPCT in the last 72 hours. Urine analysis:    Component Value Date/Time   COLORURINE YELLOW 11/17/2019 1253   APPEARANCEUR CLOUDY (A) 11/17/2019 1253   LABSPEC 1.006 11/17/2019 1253   PHURINE 8.0  11/17/2019 1253   GLUCOSEU NEGATIVE 11/17/2019 1253   HGBUR NEGATIVE 11/17/2019 1253   BILIRUBINUR NEGATIVE 11/17/2019 1253   KETONESUR NEGATIVE 11/17/2019 1253   PROTEINUR NEGATIVE 11/17/2019 1253   UROBILINOGEN 1.0 07/22/2007 2239   NITRITE NEGATIVE 11/17/2019 1253   LEUKOCYTESUR NEGATIVE 11/17/2019 1253   Sepsis Labs: Invalid input(s): PROCALCITONIN, LACTICIDVEN  Microbiology: Recent Results (from the past 240 hour(s))  SARS Coronavirus 2 by RT PCR (hospital order, [facility]) Nasopharyngeal Nasopharyngeal Swab     Status: None   Collection Time: 07/07/20  4:35 PM   Specimen: Nasopharyngeal Swab  Result Value Ref Range Status   SARS Coronavirus 2 NEGATIVE NEGATIVE Final    Comment: (NOTE) SARS-CoV-2 target nucleic acids are NOT DETECTED.  The SARS-CoV-2 RNA is generally detectable in upper and lower respiratory specimens during the acute phase of infection. The lowest concentration of SARS-CoV-2 viral copies this assay can detect is 250 copies / mL. A negative result does not preclude SARS-CoV-2 infection and should not be used as the sole basis for treatment or other patient management decisions.  A negative result may occur with improper specimen collection / handling, submission of specimen other than nasopharyngeal swab, presence of viral mutation(s) within the areas targeted by this assay, and inadequate number of viral copies (<250 copies / mL). A negative result must be combined with clinical observations, patient history, and epidemiological information.  Fact Sheet for Patients:   BoilerBrush.com.cyhttps://www.fda.gov/media/136312/download  Fact Sheet for Healthcare Providers: https://pope.com/https://www.fda.gov/media/136313/download  This test is not yet approved or  cleared by the [place] FDA and has been authorized for detection and/or diagnosis of SARS-CoV-2 by FDA under an Emergency Use Authorization (EUA).  This EUA will remain in effect (meaning this test  can be used) for the duration of the COVID-19 declaration under Section 564(b)(1) of the Act, 21 U.S.C. section 360bbb-3(b)(1), unless the authorization is terminated or revoked sooner.  Performed at [facility], [address] [place], [place] [address]   Gram stain     Status: None   Collection Time: 07/08/20  8:45 AM   Specimen: Lung, Right; Pleural Fluid  Result Value Ref Range Status   Specimen Description PLEURAL FLUID  Final   Special Requests RIGHT LUNG  Final   Gram Stain   Final    WBC PRESENT,BOTH PMN AND MONONUCLEAR NO ORGANISMS SEEN CYTOSPIN SMEAR Performed at [facility], [address] [place], [place] [address]    Report Status 07/08/2020 FINAL  Final  Acid Fast Smear (AFB)     Status: None   Collection Time: 07/08/20  8:45 AM   Specimen: Lung, Right; Pleural Fluid  Result Value Ref Range Status   AFB Specimen Processing Concentration  Final   Acid Fast Smear Negative  Final    Comment: (NOTE) Performed At: [facility] [place] [address] [place], [place] [id number] Jolene SchimkeNagendra Sanjai MD UX:[id number]:[phone]    Source (AFB) RIGHT LUNG  Final    Comment: Performed at [facility], [address] [place], [place] [address]  Culture, body fluid-bottle     Status: None (Preliminary result)   Collection Time: 07/08/20  8:45 AM   Specimen: Pleura  Result Value Ref Range Status   Specimen Description PLEURAL FLUID  Final   Special Requests RIGHT LUNG  Final   Culture   Final    NO GROWTH < 24 HOURS Performed at [facility], [address] [place], [place] [address]    Report Status PENDING  Incomplete    Radiology Studies: DG Chest 1 View  Result Date: 07/10/2020 CLINICAL DATA:  Post thoracentesis. EXAM: CHEST  1 VIEW COMPARISON:  07/09/2020 chest radiograph and prior. FINDINGS: No pneumothorax. Decreased right pleural effusion. Perihilar and bibasilar opacities are less conspicuous than prior exam. Increased conspicuity of left  pleural effusion may be secondary to patient positioning. IMPRESSION: Decreased right pleural effusion. No pneumothorax. Grossly unchanged small left pleural effusion. Electronically Signed   By: Stana Bunting M.D.   On: 07/10/2020 08:46   DG Chest Port 1 View  Result Date: 07/09/2020 CLINICAL DATA:  Shortness of breath.  Thoracentesis 07/08/2020 EXAM: PORTABLE CHEST 1 VIEW COMPARISON:  One-view chest x-ray 07/08/2020 and 07/07/2020 FINDINGS: The heart is enlarged. Atherosclerotic changes are noted at the aortic arch. Increasing diffuse interstitial edema is present. There is some reticulation of the left pleural effusion. Moderate right pleural effusion is stable. IMPRESSION: 1. Increasing diffuse interstitial edema. 2. Stable moderate right pleural effusion. Electronically Signed   By: Marin Roberts M.D.   On: 07/09/2020 16:56     Razi Hickle T[facility]res [facility]  If 7PM-7AM, please contact night-coverage [email or web address] 07/10/2020, 11:49 AM

## 2020-07-11 ENCOUNTER — Inpatient Hospital Stay (HOSPITAL_COMMUNITY): Payer: Medicare Other

## 2020-07-11 DIAGNOSIS — J9601 Acute respiratory failure with hypoxia: Secondary | ICD-10-CM | POA: Diagnosis not present

## 2020-07-11 DIAGNOSIS — I5033 Acute on chronic diastolic (congestive) heart failure: Secondary | ICD-10-CM | POA: Diagnosis not present

## 2020-07-11 DIAGNOSIS — J690 Pneumonitis due to inhalation of food and vomit: Secondary | ICD-10-CM | POA: Diagnosis not present

## 2020-07-11 DIAGNOSIS — J90 Pleural effusion, not elsewhere classified: Secondary | ICD-10-CM | POA: Diagnosis not present

## 2020-07-11 LAB — CBC WITH DIFFERENTIAL/PLATELET
Abs Immature Granulocytes: 0.06 10*3/uL (ref 0.00–0.07)
Basophils Absolute: 0 10*3/uL (ref 0.0–0.1)
Basophils Relative: 0 %
Eosinophils Absolute: 0 10*3/uL (ref 0.0–0.5)
Eosinophils Relative: 0 %
HCT: 37.2 % (ref 36.0–46.0)
Hemoglobin: 12.2 g/dL (ref 12.0–15.0)
Immature Granulocytes: 1 %
Lymphocytes Relative: 6 %
Lymphs Abs: 0.6 10*3/uL — ABNORMAL LOW (ref 0.7–4.0)
MCH: 29.3 pg (ref 26.0–34.0)
MCHC: 32.8 g/dL (ref 30.0–36.0)
MCV: 89.2 fL (ref 80.0–100.0)
Monocytes Absolute: 0.7 10*3/uL (ref 0.1–1.0)
Monocytes Relative: 6 %
Neutro Abs: 9.2 10*3/uL — ABNORMAL HIGH (ref 1.7–7.7)
Neutrophils Relative %: 87 %
Platelets: 159 10*3/uL (ref 150–400)
RBC: 4.17 MIL/uL (ref 3.87–5.11)
RDW: 13.1 % (ref 11.5–15.5)
WBC: 10.6 10*3/uL — ABNORMAL HIGH (ref 4.0–10.5)
nRBC: 0 % (ref 0.0–0.2)

## 2020-07-11 LAB — BLOOD GAS, ARTERIAL
Acid-Base Excess: 10.8 mmol/L — ABNORMAL HIGH (ref 0.0–2.0)
Bicarbonate: 35.6 mmol/L — ABNORMAL HIGH (ref 20.0–28.0)
Drawn by: 331001
FIO2: 100
O2 Saturation: 95.4 %
Patient temperature: 39
pCO2 arterial: 60.2 mmHg — ABNORMAL HIGH (ref 32.0–48.0)
pH, Arterial: 7.4 (ref 7.350–7.450)
pO2, Arterial: 91.1 mmHg (ref 83.0–108.0)

## 2020-07-11 LAB — RENAL FUNCTION PANEL
Albumin: 2.1 g/dL — ABNORMAL LOW (ref 3.5–5.0)
Anion gap: 11 (ref 5–15)
BUN: 16 mg/dL (ref 8–23)
CO2: 41 mmol/L — ABNORMAL HIGH (ref 22–32)
Calcium: 9.2 mg/dL (ref 8.9–10.3)
Chloride: 83 mmol/L — ABNORMAL LOW (ref 98–111)
Creatinine, Ser: 0.65 mg/dL (ref 0.44–1.00)
GFR, Estimated: >60 mL/min (ref >60)
Glucose, Bld: 98 mg/dL (ref 70–99)
Phosphorus: 3.1 mg/dL (ref 2.5–4.6)
Potassium: 3.5 mmol/L (ref 3.5–5.1)
Sodium: 135 mmol/L (ref 135–145)

## 2020-07-11 LAB — PROCALCITONIN: Procalcitonin: <0.1 ng/mL

## 2020-07-11 LAB — MAGNESIUM: Magnesium: 2 mg/dL (ref 1.7–2.4)

## 2020-07-11 MED ORDER — DILTIAZEM HCL-DEXTROSE 125-5 MG/125ML-% IV SOLN (PREMIX)
5.0000 mg/h | INTRAVENOUS | Status: DC
  Administered 2020-07-11: 5 mg/h via INTRAVENOUS
  Administered 2020-07-12: 10 mg/h via INTRAVENOUS
  Administered 2020-07-12: 15 mg/h via INTRAVENOUS
  Filled 2020-07-11 (×2): qty 125

## 2020-07-11 MED ORDER — DILTIAZEM LOAD VIA INFUSION
10.0000 mg | Freq: Once | INTRAVENOUS | Status: AC
  Administered 2020-07-11: 10 mg via INTRAVENOUS
  Filled 2020-07-11: qty 10

## 2020-07-11 MED ORDER — METHYLPREDNISOLONE SODIUM SUCC 40 MG IJ SOLR
40.0000 mg | Freq: Two times a day (BID) | INTRAMUSCULAR | Status: DC
  Administered 2020-07-11 – 2020-07-12 (×2): 40 mg via INTRAVENOUS
  Filled 2020-07-11 (×2): qty 1

## 2020-07-11 MED ORDER — POTASSIUM CHLORIDE 10 MEQ/100ML IV SOLN
10.0000 meq | INTRAVENOUS | Status: AC
  Administered 2020-07-11 (×4): 10 meq via INTRAVENOUS
  Filled 2020-07-11 (×4): qty 100

## 2020-07-11 MED ORDER — SODIUM CHLORIDE 0.9 % IV SOLN
3.0000 g | Freq: Four times a day (QID) | INTRAVENOUS | Status: DC
  Administered 2020-07-11 – 2020-07-12 (×4): 3 g via INTRAVENOUS
  Filled 2020-07-11: qty 8
  Filled 2020-07-11: qty 3
  Filled 2020-07-11 (×2): qty 8
  Filled 2020-07-11: qty 3

## 2020-07-11 MED ORDER — POTASSIUM CHLORIDE CRYS ER 20 MEQ PO TBCR: 40.0000 meq | EXTENDED_RELEASE_TABLET | Freq: Once | ORAL | Status: DC

## 2020-07-11 MED ORDER — METOPROLOL TARTRATE 5 MG/5ML IV SOLN
2.5000 mg | Freq: Four times a day (QID) | INTRAVENOUS | Status: DC
  Administered 2020-07-11 – 2020-07-12 (×4): 2.5 mg via INTRAVENOUS
  Filled 2020-07-11 (×4): qty 5

## 2020-07-11 MED ORDER — IPRATROPIUM-ALBUTEROL 0.5-2.5 (3) MG/3ML IN SOLN
3.0000 mL | Freq: Four times a day (QID) | RESPIRATORY_TRACT | Status: DC | PRN
  Administered 2020-07-11: 3 mL via RESPIRATORY_TRACT
  Filled 2020-07-11: qty 3

## 2020-07-11 MED ORDER — POTASSIUM CHLORIDE CRYS ER 20 MEQ PO TBCR
40.0000 meq | EXTENDED_RELEASE_TABLET | Freq: Once | ORAL | Status: DC
  Filled 2020-07-11: qty 2

## 2020-07-11 NOTE — Progress Notes (Signed)
PIV consult: Extensive bruising to R forearm. No cephalic vein noted on [place] for PIV. Assessed L arm. Bright red bruise noted medial antecubital area. Cephalic vein too small for cannulation. Superficial posterior wrist PIV placed. Does not appear to communicate with previous IV/vein.

## 2020-07-11 NOTE — Progress Notes (Signed)
Pt desats to 79% on 3 Liters. Now with upper airway wheeze. Dr Alanda Slim notified. CXR results in Epic. Orders received.

## 2020-07-11 NOTE — Progress Notes (Signed)
Called by RN. Pt with HR 130-140's.  Hx of a-fib.  EKG ordered.  Started on cardizem infusion.

## 2020-07-11 NOTE — TOC Progression Note (Signed)
Transition of Care [facility]) - Progression Note    Patient Details  Name: [name] MRN: [id number] Date of Birth: March 18, 1931  Transition of Care [facility]) CM/SW Contact  Patrice [place], [place] Phone Number: [phone] 07/11/2020, 1:57 PM  Clinical Narrative:     CSW was alerted that patient's daughter [name] requested a call back from CSW. Diane wanted the status of patient's discharge. CSW informed her that patient's discharge was being delayed due to not being medically ready for discharge.  TOC team will continue to assist with discharge planning needs.  Expected Discharge Plan: Skilled Nursing Facility Barriers to Discharge: Continued Medical Work up  Expected Discharge Plan and Services Expected Discharge Plan: Skilled Nursing Facility   Discharge Planning Services: CM Consult Post Acute Care Choice: Home Health Living arrangements for the past 2 months: Single Family Home                 DME Arranged: N/A DME Agency: NA       HH Arranged: RN, PT, OT HH Agency: [facility] Date [facility] Agency Contacted: 07/01/20 Time HH Agency Contacted: 1601 Representative spoke with at [facility] Agency: Lorenza Chick   Social Determinants of Health (SDOH) Interventions    Readmission Risk Interventions No flowsheet data found.

## 2020-07-11 NOTE — Progress Notes (Signed)
Called by RN the patient's having heart rate in the 120s 130s and O2 sat decreased to 81%.  Placed on nonrebreather and O2 sat now 90%.  Rapid response was called.  Discussed with rapid response charge nurse.  We will check ABG. Initiate BiPAP Continue to monitor closely

## 2020-07-11 NOTE — Progress Notes (Signed)
PROGRESS NOTE  ALAYSIA LIGHTLE UUV:[id number] DOB: Mar 01, 1931   PCP: Kermit Balo, DO  Patient is from: Home  DOA: Jul 25, 2020 LOS: 13  Chief complaints: Shortness of breath  Brief Narrative / Interim history: 86 year old female with history of paroxysmal A. fib on Eliquis, diastolic CHF, HTN, hypothyroidism, anxiety and GERD presenting with shortness of breath and admitted for acute on chronic diastolic CHF, possible aspiration pneumonia and hyponatremia.  She was treated with IV Lasix and transition to p.o. Lasix.  She also completed antibiotic course for 7 days for aspiration pneumonia.  However, she continued to require significant oxygen.  CXR obtained on 07/07/2020 and showed a moderately large pleural effusion bilaterally.  Patient had bilateral thoracocentesis with removal of 750 cc from left and 850 cc from right.  Pleural fluid was transudative and culture negative.  Patient continued to have intermittent respiratory distress.  IV Lasix resumed with improvement in her oxygenation and symptoms.  SLP recommended dysphagia 3 diet.  Therapy recommended SNF likely over the weekend or early next week  Subjective: Seen and examined earlier this morning.  No major events overnight of this morning.  No complaints but not a great historian.  She is more alert today.  She is oriented to self and month only.  She says she is at [employer].  Reports improvement in her breathing.  Denies chest pain,, nausea, vomiting, abdominal pain or UTI symptoms.  Objective: Vitals:   07/11/20 0618 07/11/20 0733 07/11/20 0734 07/11/20 1228  BP: (!) 123/93   133/82  Pulse:  (!) 116 93 94  Resp:  (!) 26 (!) 24 19  Temp: 98.7 F (37.1 C)   98.9 F (37.2 C)  TempSrc: Oral   Oral  SpO2: 92% (!) 86% (!) 83% 93%  Weight:      Height:        Intake/Output Summary (Last 24 hours) at 07/11/2020 1345 Last data filed at 07/10/2020 1629 Gross per 24 hour  Intake --  Output 425 ml  Net -425 ml    Filed Weights   07/07/20 0327 07/08/20 0432 07/09/20 0522  Weight: 62.8 kg 57.5 kg 64.7 kg    Examination:  GENERAL: Frail looking elderly female.  Nontoxic. HEENT: MMM.  Vision and hearing grossly intact.  NECK: Supple.  No apparent JVD.  RESP: 94% on 4 L.  No IWOB.  Diminished bibasilar aeration.  Rhonchi bilaterally. CVS: Irregular rhythm.  Normal rate.Marland Kitchen Heart sounds normal.  ABD/GI/GU: BS+. Abd soft, NTND.  MSK/EXT:  Moves extremities. No apparent deformity.  Trace edema bilaterally. SKIN: no apparent skin lesion or wound NEURO: Awake and alert.  Oriented to self and month only.  No apparent focal neuro deficit. PSYCH: Calm. Normal affect.   Procedures:  07/08/2020-left-sided thoracocentesis with removal of 750 cc transudative fluid  Microbiology summarized: COVID-19 and influenza PCR negative. Follow RVP panel negative. Pleural fluid culture negative.  Assessment & Plan: Acute hypoxic respiratory failure: Multifactorial including CHF exacerbation, pneumonia and pleural effusion. Continues to have intermittent respiratory distress with increased oxygen requirement.  Requiring 4 L to maintain appropriate saturation at rest.  ABG with hypercarbia to 73 but compensated -Treat treatable causes as below -Wean oxygen as able-minimum oxygen to keep saturation above 90% -Encourage incentive spirometry, OOB, PT/OT  Possible community-acquired pneumonia/aspiration pneumonia: CT chest showed multifocal opacities. -Completed 7 days of antibiotic course with IV Unasyn and Augmentin on 11/23. -Dysphagia-3 diet per SLP recommendation.  Acute on chronic diastolic heart failure: TTE on 11/14 with  EF of 60 to 65%, RVSP of 51, severe LAE, severe RAE. Diuresed with IV Lasix and transitioned to p.o. However, developed respiratory distress and restarted on IV Lasix.  UOP 850 cc charted yesterday, none overnight.  Net -6.5 L.  Some alkalosis but improved. -Continue IV Lasix 20 mg  daily -Continue Diamox 250 mg twice daily -Monitor fluid status, renal function and electrolytes.   Moderate bilateral pleural effusion-S/p bilateral thoracocentesis with removal of 750 cc from left and 850 cc from right.  Left pleural fluid transudative and culture negative.  -IV diuretics as above.  Acute metabolic encephalopathy: Improved.  Awake and alert but only oriented to self commands only.  Did not take Xanax yesterday.  Temazepam discontinued.  ABG with compensated hypercapnia -Discontinue Xanax-she did not need it in the last 48 hours. -Reorientation and delirium precautions  Paroxysmal A. Fib: Rate controlled. -Continue metoprolol and Eliquis -Outpatient follow-up with cardiology  Hyponatremia: Na 131> 135. -Continue p.o. sodium chloride -Discontinued ARB  Mild hyperkalemia: K5.5> 3.5.  Resolved.  Metabolic alkalosis: Contraction alkalosis.  ABG 7.4/72/45/45 favoring metabolic alkalosis with respiratory compensation or resolving respiratory acidosis.  -Wean oxygen as able. -Continue Diamox as above.  Hypothyroidism -Continue Synthroid  Essential hypertension: Normotensive for most part. -Continue metoprolol and Lasix -Discontinued irbesartan, amlodipine and HCTZ  GERD -Continue Protonix  Anxiety/insomnia: On Zoloft, Xanax and temazepam.  Not a great combination.  At risk for polypharmacy. -Doing well off temazepam and Xanax. -Continue home Zoloft  Generalized deconditioning -Therapy recommended SNF.  Constipation -As needed MiraLAX, Senokot-S and/or magnesium citrate based on severity  Possible urinary retention: Seems to have resolved after bethanechol. -Continue bethanechol 10 mg 3 times daily -Closely monitor UOP -Intermittent bladder scan   Body mass index is 21.68 kg/m.         DVT prophylaxis:  apixaban (ELIQUIS) tablet 2.5 mg Start: 06/28/20 1000 apixaban (ELIQUIS) tablet 2.5 mg  Code Status: DNR/DNI Family Communication: Updated  patient's daughter over the phone on 11/25. Status is: Inpatient  Remains inpatient appropriate because:Altered mental status, Unsafe d/c plan, IV treatments appropriate due to intensity of illness or inability to take PO and Inpatient level of care appropriate due to severity of illness   Dispo: The patient is from: Home              Anticipated d/c is to: SNF              Anticipated d/c date is: 2 days              Patient currently is not medically stable to d/c.       Consultants:  Interventional radiology   Sch Meds:  Scheduled Meds: . acetaZOLAMIDE  250 mg Oral BID  . apixaban  2.5 mg Oral BID  . bethanechol  10 mg Oral TID  . furosemide  20 mg Intravenous Daily  . levothyroxine  100 mcg Oral Q0600  . melatonin  3 mg Oral QHS  . metoprolol succinate  50 mg Oral QPM  . pantoprazole  40 mg Oral Daily  . potassium chloride  40 mEq Oral Once  . sertraline  50 mg Oral q AM  . sodium chloride flush  3 mL Intravenous Q12H  . sodium chloride  1 g Oral Daily   Continuous Infusions: PRN Meds:.acetaminophen **OR** acetaminophen, hydrocortisone, lidocaine (PF), magnesium citrate, ondansetron **OR** ondansetron (ZOFRAN) IV, polyethylene glycol, Resource ThickenUp Clear, senna-docusate  Antimicrobials: Anti-infectives (From admission, onward)   Start     Dose/Rate  Route Frequency Ordered Stop   07/01/20 1400  Ampicillin-Sulbactam (UNASYN) 3 g in sodium chloride 0.9 % 100 mL IVPB        3 g 200 mL/hr over 30 Minutes Intravenous Every 8 hours 07/01/20 1250 07/07/20 2359   06/30/20 1145  amoxicillin-clavulanate (AUGMENTIN) 875-125 MG per tablet 1 tablet  Status:  Discontinued        1 tablet Oral Every 12 hours 06/30/20 1055 07/01/20 1138       I have personally reviewed the following labs and images: CBC: Recent Labs  Lab 07/05/20 0248 07/08/20 0210 07/09/20 0058 07/10/20 0231  WBC 8.5 8.0 9.0 10.4  NEUTROABS 6.9 6.6  --   --   HGB 11.3* 10.6* 11.1* 12.7  HCT 34.9*  34.4* 35.9* 41.0  MCV 89.9 97.2 96.5 96.7  PLT 171 162 167 178   BMP &GFR Recent Labs  Lab 07/07/20 0159 07/07/20 0159 07/08/20 0210 07/08/20 0210 07/09/20 0058 07/09/20 1620 07/10/20 0231 07/10/20 1746 07/11/20 0414  NA 135   < > 135   < > 132* 130* 131* 137 135  K 4.5   < > 4.7   < > 5.2* 5.2* 5.5* 4.7 3.5  CL 85*   < > 85*   < > 84* 84* 80* 80* 83*  CO2 41*   < > 39*   < > 42* 38* 45* 45* 41*  GLUCOSE 130*   < > 116*   < > 106* 197* 130* 126* 98  BUN 22   < > 22   < > 17 18 17 15 16   CREATININE 0.53   < > 0.53   < > 0.51 0.49 0.56 0.67 0.65  CALCIUM 9.2   < > 9.5   < > 9.4 9.7 9.9 9.8 9.2  MG 2.1  --  2.0  --  1.9  --  1.7  --  2.0  PHOS  --   --   --   --  4.4 4.4 4.9* 3.7 3.1   < > = values in this interval not displayed.   Estimated Creatinine Clearance: 50 mL/min (by C-G formula based on SCr of 0.65 mg/dL). Liver & Pancreas: Recent Labs  Lab 07/09/20 0058 07/09/20 1620 07/10/20 0231 07/10/20 1746 07/11/20 0414  ALBUMIN 2.2* 2.5* 2.3* 2.4* 2.1*   No results for input(s): LIPASE, AMYLASE in the last 168 hours. Recent Labs  Lab 07/10/20 1746  AMMONIA 21   Diabetic: No results for input(s): HGBA1C in the last 72 hours. No results for input(s): GLUCAP in the last 168 hours. Cardiac Enzymes: No results for input(s): CKTOTAL, CKMB, CKMBINDEX, TROPONINI in the last 168 hours. No results for input(s): PROBNP in the last 8760 hours. Coagulation Profile: No results for input(s): INR, PROTIME in the last 168 hours. Thyroid Function Tests: No results for input(s): TSH, T4TOTAL, FREET4, T3FREE, THYROIDAB in the last 72 hours. Lipid Profile: No results for input(s): CHOL, HDL, LDLCALC, TRIG, CHOLHDL, LDLDIRECT in the last 72 hours. Anemia Panel: No results for input(s): VITAMINB12, FOLATE, FERRITIN, TIBC, IRON, RETICCTPCT in the last 72 hours. Urine analysis:    Component Value Date/Time   COLORURINE YELLOW 11/17/2019 1253   APPEARANCEUR CLOUDY (A) 11/17/2019  1253   LABSPEC 1.006 11/17/2019 1253   PHURINE 8.0 11/17/2019 1253   GLUCOSEU NEGATIVE 11/17/2019 1253   HGBUR NEGATIVE 11/17/2019 1253   BILIRUBINUR NEGATIVE 11/17/2019 1253   KETONESUR NEGATIVE 11/17/2019 1253   PROTEINUR NEGATIVE 11/17/2019 1253   UROBILINOGEN 1.0 07/22/2007  2239   NITRITE NEGATIVE 11/17/2019 1253   LEUKOCYTESUR NEGATIVE 11/17/2019 1253   Sepsis Labs: Invalid input(s): PROCALCITONIN, LACTICIDVEN  Microbiology: Recent Results (from the past 240 hour(s))  SARS Coronavirus 2 by RT PCR (hospital order, performed in [facility] hospital lab) Nasopharyngeal Nasopharyngeal Swab     Status: None   Collection Time: 07/07/20  4:35 PM   Specimen: Nasopharyngeal Swab  Result Value Ref Range Status   SARS Coronavirus 2 NEGATIVE NEGATIVE Final    Comment: (NOTE) SARS-CoV-2 target nucleic acids are NOT DETECTED.  The SARS-CoV-2 RNA is generally detectable in upper and lower respiratory specimens during the acute phase of infection. The lowest concentration of SARS-CoV-2 viral copies this assay can detect is 250 copies / mL. A negative result does not preclude SARS-CoV-2 infection and should not be used as the sole basis for treatment or other patient management decisions.  A negative result may occur with improper specimen collection / handling, submission of specimen other than nasopharyngeal swab, presence of viral mutation(s) within the areas targeted by this assay, and inadequate number of viral copies (<250 copies / mL). A negative result must be combined with clinical observations, patient history, and epidemiological information.  Fact Sheet for Patients:   BoilerBrush.com.cy  Fact Sheet for Healthcare Providers: https://pope.com/  This test is not yet approved or  cleared by the [place] FDA and has been authorized for detection and/or diagnosis of SARS-CoV-2 by FDA under an Emergency Use Authorization  (EUA).  This EUA will remain in effect (meaning this test can be used) for the duration of the COVID-19 declaration under Section 564(b)(1) of the Act, 21 U.S.C. section 360bbb-3(b)(1), unless the authorization is terminated or revoked sooner.  Performed at [facility], [address] [place], [place] [address]   Gram stain     Status: None   Collection Time: 07/08/20  8:45 AM   Specimen: Lung, Right; Pleural Fluid  Result Value Ref Range Status   Specimen Description PLEURAL FLUID  Final   Special Requests RIGHT LUNG  Final   Gram Stain   Final    WBC PRESENT,BOTH PMN AND MONONUCLEAR NO ORGANISMS SEEN CYTOSPIN SMEAR Performed at [facility], [address] [place], [place] [address]    Report Status 07/08/2020 FINAL  Final  Acid Fast Smear (AFB)     Status: None   Collection Time: 07/08/20  8:45 AM   Specimen: Lung, Right; Pleural Fluid  Result Value Ref Range Status   AFB Specimen Processing Concentration  Final   Acid Fast Smear Negative  Final    Comment: (NOTE) Performed At: [facility] [address] [place], [place] [id number] Jolene Schimke MD OV:[id number]    Source (AFB) RIGHT LUNG  Final    Comment: Performed at [facility], [address], [place], [place] [address]  Culture, body fluid-bottle     Status: None (Preliminary result)   Collection Time: 07/08/20  8:45 AM   Specimen: Pleura  Result Value Ref Range Status   Specimen Description PLEURAL FLUID  Final   Special Requests RIGHT LUNG  Final   Culture   Final    NO GROWTH 3 DAYS Performed at [facility], [address], [place], [place] [address]    Report Status PENDING  Incomplete    Radiology Studies: No results found.   Marquiz Sotelo T. Kahiau Schewe [facility]  If 7PM-7AM, please contact night-coverage [email or web address] 07/11/2020, 1:45 PM

## 2020-07-11 NOTE — Significant Event (Signed)
Rapid Response Event Note   Reason for Call :  SpO2-80s.  Pt had episode of coughing and dropping SpO2 to 70s while eating earliertoday. Since then, her FiO2 demand has been increasing.  Pt went into Afib RVR-130s around 2100 tonight and cardizem gtt was started and is being titrated.   Initial Focused Assessment:  Pt sitting up in bed with eyes open, alert but confused. Pt says she is SOB. Breathing is tachypneic and labored. Lungs with rhonchi t/o. Skin warm and dry.   T-98.4, HR-130s(Afib), BP-158/76, RR-24, SpO2-83% on 8L HFNC and NRB.  FiO2 increased to 15L HFNC and NRB with SpO2 increasing to 100%. Pt cardizem gtt being titrated for increased HR.   A few minutes later, NRB removed with SpO2-96-98% on 15L HFNC. Pt tachpnea/WOB also better   Interventions:  HFNC/NRB ABG-7.4/60.2/91.1/35.6 Bipap Plan of Care:  Continue Cardizem gtt. Pt SpO2 better now-pt down to 15L HFNC without NRB but her breathing is still labored. MD wants to try bipap. RT notified. Pt has a very weak congested cough-monitor pt closely. Call RRT if further assistance needed.    Event Summary:   MD Notified: Dr. Rachael Darby notified by 6E RN Call [phone] Arrival [phone] End Time:2340  Terrilyn Saver, RN

## 2020-07-11 NOTE — TOC Progression Note (Addendum)
Transition of Care [facility]) - Progression Note    Patient Details  Name: [name] MRN: [id number] Date of Birth: [birth year]  Transition of Care [facility]) CM/SW Contact  Patrice [place], [place] Phone Number: [phone] 07/11/2020, 11:21 AM  Clinical Narrative:    CSW spoke with Janie from [facility] inquiring if patient was being discharged today.. CSW reached out to [facility] and was notified that patient was not medially ready for discharge. CSW reached out again to [place] and notified that patient was not being discharged today. Janie informed CSW that she could accept patient tomorrow however could not guarantee a bed on Monday.  TOC team will continue to assist with discharge planning needs.    Expected Discharge Plan: Skilled Nursing Facility Barriers to Discharge: Continued Medical Work up  Expected Discharge Plan and Services Expected Discharge Plan: Skilled Nursing Facility   Discharge Planning Services: CM Consult Post Acute Care Choice: Home Health Living arrangements for the past 2 months: Single Family Home                 DME Arranged: N/A DME Agency: NA       HH Arranged: RN, PT, OT HH Agency: [facility] Date [facility] Agency Contacted: 07/01/20 Time HH Agency Contacted: 1601 Representative spoke with at [facility] Agency: Lorenza Chick   Social Determinants of Health (SDOH) Interventions    Readmission Risk Interventions No flowsheet data found.

## 2020-07-11 NOTE — Progress Notes (Signed)
Pt too weak this morning for her to take all of her po meds. Was able to take eliquis and protonix, but it took a while to swallow meds. Had some coughing with apple sauce and her sats went down to 70's. I turned her O2 up to 4 liters while we bathed her and then sats were back to 95% once we finished with bathing. O2 now at 3 L via [place]. Sats 94%. Dr Alanda Slim notified. Orders received.

## 2020-07-11 NOTE — Progress Notes (Signed)
  Patient HR going up to 140's afib,  SBP 130's, notified oncall hospitalist and ordered EKG stat, Cardizem drip ordered and was initiated.

## 2020-07-11 NOTE — Progress Notes (Signed)
PIV RN at bedside starting another IV, oxygen saturation initially at higher 90's and then started going down to 80's on 3l [place], increased oxygen to 5l [place] but sats stayed on low 80's. RT and Rapid response RN called, patient put on nonrebreather for a while and sats came back up to 100%. Oncall provider paged to notify of patient's condition.

## 2020-07-11 NOTE — Progress Notes (Signed)
°   07/11/20 2300  Oxygen Therapy/Pulse Ox  O2 Device HFNC  O2 Therapy Oxygen humidified  O2 Flow Rate (L/min) 15 L/min  SpO2 97 %  Rt called to bedside pt. Was short of breath sats. In the 80s placed NRB at 15L ABG was collected waiting for results. Pt. Placed on HFNC saturation is 97%

## 2020-07-12 DIAGNOSIS — Z7189 Other specified counseling: Secondary | ICD-10-CM | POA: Diagnosis not present

## 2020-07-12 DIAGNOSIS — J90 Pleural effusion, not elsewhere classified: Secondary | ICD-10-CM | POA: Diagnosis not present

## 2020-07-12 DIAGNOSIS — I5033 Acute on chronic diastolic (congestive) heart failure: Secondary | ICD-10-CM | POA: Diagnosis not present

## 2020-07-12 DIAGNOSIS — Z66 Do not resuscitate: Secondary | ICD-10-CM | POA: Diagnosis not present

## 2020-07-12 DIAGNOSIS — I4891 Unspecified atrial fibrillation: Secondary | ICD-10-CM

## 2020-07-12 DIAGNOSIS — J9601 Acute respiratory failure with hypoxia: Secondary | ICD-10-CM | POA: Diagnosis not present

## 2020-07-12 DIAGNOSIS — J690 Pneumonitis due to inhalation of food and vomit: Secondary | ICD-10-CM | POA: Diagnosis not present

## 2020-07-12 DIAGNOSIS — Z515 Encounter for palliative care: Secondary | ICD-10-CM | POA: Diagnosis not present

## 2020-07-12 LAB — CBC WITH DIFFERENTIAL/PLATELET
Abs Immature Granulocytes: 0.05 10*3/uL (ref 0.00–0.07)
Basophils Absolute: 0 10*3/uL (ref 0.0–0.1)
Basophils Relative: 0 %
Eosinophils Absolute: 0 10*3/uL (ref 0.0–0.5)
Eosinophils Relative: 0 %
HCT: 39.4 % (ref 36.0–46.0)
Hemoglobin: 12.7 g/dL (ref 12.0–15.0)
Immature Granulocytes: 1 %
Lymphocytes Relative: 3 %
Lymphs Abs: 0.2 10*3/uL — ABNORMAL LOW (ref 0.7–4.0)
MCH: 29.3 pg (ref 26.0–34.0)
MCHC: 32.2 g/dL (ref 30.0–36.0)
MCV: 90.8 fL (ref 80.0–100.0)
Monocytes Absolute: 0.1 10*3/uL (ref 0.1–1.0)
Monocytes Relative: 1 %
Neutro Abs: 6.8 10*3/uL (ref 1.7–7.7)
Neutrophils Relative %: 95 %
Platelets: 168 10*3/uL (ref 150–400)
RBC: 4.34 MIL/uL (ref 3.87–5.11)
RDW: 13.2 % (ref 11.5–15.5)
WBC: 7.1 10*3/uL (ref 4.0–10.5)
nRBC: 0 % (ref 0.0–0.2)

## 2020-07-12 LAB — RENAL FUNCTION PANEL
Albumin: 2.3 g/dL — ABNORMAL LOW (ref 3.5–5.0)
Anion gap: 14 (ref 5–15)
BUN: 21 mg/dL (ref 8–23)
CO2: 36 mmol/L — ABNORMAL HIGH (ref 22–32)
Calcium: 8.9 mg/dL (ref 8.9–10.3)
Chloride: 87 mmol/L — ABNORMAL LOW (ref 98–111)
Creatinine, Ser: 1 mg/dL (ref 0.44–1.00)
GFR, Estimated: 55 mL/min — ABNORMAL LOW (ref >60)
Glucose, Bld: 158 mg/dL — ABNORMAL HIGH (ref 70–99)
Phosphorus: 3.4 mg/dL (ref 2.5–4.6)
Potassium: 4.2 mmol/L (ref 3.5–5.1)
Sodium: 137 mmol/L (ref 135–145)

## 2020-07-12 LAB — PROCALCITONIN: Procalcitonin: <0.1 ng/mL

## 2020-07-12 LAB — MAGNESIUM: Magnesium: 2 mg/dL (ref 1.7–2.4)

## 2020-07-12 LAB — AMMONIA: Ammonia: 12 umol/L (ref 9–35)

## 2020-07-12 MED ORDER — IPRATROPIUM BROMIDE 0.02 % IN SOLN: 0.5000 mg | Freq: Three times a day (TID) | RESPIRATORY_TRACT | Status: DC

## 2020-07-12 MED ORDER — MORPHINE SULFATE (PF) 2 MG/ML IV SOLN: 2.0000 mg | INTRAVENOUS | Status: DC | PRN

## 2020-07-12 MED ORDER — TRAZODONE HCL 50 MG PO TABS
50.0000 mg | ORAL_TABLET | Freq: Every evening | ORAL | Status: DC | PRN
  Administered 2020-07-12: 50 mg via ORAL
  Filled 2020-07-12: qty 1

## 2020-07-12 MED ORDER — LORAZEPAM 2 MG/ML IJ SOLN: 1.0000 mg | Freq: Once | INTRAMUSCULAR | Status: DC

## 2020-07-12 MED ORDER — IPRATROPIUM-ALBUTEROL 0.5-2.5 (3) MG/3ML IN SOLN: 3.0000 mL | RESPIRATORY_TRACT | Status: DC | PRN

## 2020-07-12 MED ORDER — LORAZEPAM 2 MG/ML IJ SOLN
1.0000 mg | Freq: Once | INTRAMUSCULAR | Status: AC
  Administered 2020-07-12: 1 mg via INTRAVENOUS
  Filled 2020-07-12: qty 1

## 2020-07-12 MED ORDER — BISACODYL 10 MG RE SUPP: 10.0000 mg | Freq: Every day | RECTAL | Status: DC | PRN

## 2020-07-12 MED ORDER — GLYCOPYRROLATE 0.2 MG/ML IJ SOLN: 0.4000 mg | INTRAMUSCULAR | Status: DC | PRN

## 2020-07-12 MED ORDER — LEVALBUTEROL HCL 0.63 MG/3ML IN NEBU: 0.6300 mg | INHALATION_SOLUTION | Freq: Three times a day (TID) | RESPIRATORY_TRACT | Status: DC

## 2020-07-12 MED ORDER — LORAZEPAM 2 MG/ML IJ SOLN: 0.5000 mg | INTRAMUSCULAR | Status: DC | PRN

## 2020-07-12 NOTE — Plan of Care (Signed)
  Problem: Pain Managment: Goal: General experience of comfort will improve Outcome: Progressing   Problem: Safety: Goal: Ability to remain free from injury will improve Outcome: Progressing   

## 2020-07-12 NOTE — Progress Notes (Signed)
Manufacturing systems engineer [facility]) Hospital Liaison note.   Received request from [facility] manager for family interest in [facility]. [facility] is unable to offer a room today. Hospital Liaison will follow up tomorrow or sooner if a room becomes available.  Daughter [name] and I spoke as well as other family members and they are interested in [facility] for sure. [facility] will follow up.   A Please do not hesitate to call with questions.   Thank you,  Yolande Jolly, BSN, RN    [facility] Liaison (listed on AMION under [facility] [place])   [phone]

## 2020-07-12 NOTE — Progress Notes (Addendum)
   07/11/20 2225  Assess: MEWS Score  Temp 98.7 F (37.1 C)  BP (!) 158/76  Pulse Rate (!) 105  ECG Heart Rate (!) 131  Resp (!) 21  Level of Consciousness Alert  SpO2 (!) 85 %  O2 Device HFNC  Patient Activity (if Appropriate) In bed  O2 Flow Rate (L/min) 8 L/min  Assess: MEWS Score  MEWS Temp 0  MEWS Systolic 0  MEWS Pulse 3  MEWS RR 1  MEWS LOC 0  MEWS Score 4  MEWS Score Color Red  Assess: if the MEWS score is Yellow or Red  Were vital signs taken at a resting state? Yes  Focused Assessment Change from prior assessment (see assessment flowsheet)  Early Detection of Sepsis Score *See Row Information* Medium  MEWS guidelines implemented *See Row Information* Yes  Treat  MEWS Interventions Administered scheduled meds/treatments;Administered prn meds/treatments;Escalated (See documentation below);Consulted Respiratory Therapy;Other (Comment) (MD Notified)  Take Vital Signs  Increase Vital Sign Frequency  Red: Q 1hr X 4 then Q 4hr X 4, if remains red, continue Q 4hrs  Escalate  MEWS: Escalate Red: discuss with charge nurse/RN and provider, consider discussing with RRT  Notify: Charge Nurse/RN  Name of Charge Nurse/RN Notified Shanell RN  Date Charge Nurse/RN Notified 07/11/20  Time Charge Nurse/RN Notified 2225  Notify: Provider  Provider Name/Title Chotiner Dr  Date Provider Notified 07/11/20  Time Provider Notified 2231 (2130 and 2231)  Notification Type Page  Notification Reason Change in status  Response See new orders (EKG, cardizem drip, ABG, BIPAP)  Date of Provider Response 07/11/20  Time of Provider Response 2231 (2102  and 2231)  Notify: Rapid Response  Name of Rapid Response RN Notified Mindy RN  Date Rapid Response Notified 07/11/20  Time Rapid Response Notified 2215  Document  Patient Outcome Stabilized after interventions    Patient resting in bed and breathing better at this time, resp. 18, spo2 sats 100% on BIPAP, BP 124/79, HR 85. Cardizem  continue to infuse at 15mg /hr , continuing to monitor patient closely.

## 2020-07-12 NOTE — Progress Notes (Signed)
Palliative meeting complete. Comfort care orders received. Cardiac monitor and IV medications discontinued. Oral care provided to pt.

## 2020-07-12 NOTE — Progress Notes (Signed)
Meeting with palliative at 1 pm Sunday.

## 2020-07-12 NOTE — TOC Progression Note (Signed)
Transition of Care [facility]) - Progression Note    Patient Details  Name: [name] MRN: [id number] Date of Birth: [birth year]  Transition of Care [facility]) CM/SW Contact  Patrice [place], [place] Phone Number: [phone]  07/12/2020, 11:27 AM  Clinical Narrative:     CSW reached out to [facility] to ascertain if patient would be medically ready for discharge today. CSW was informed that patient was not ready and probably would not be ready tomorrow as well.  CSW spoke with Wille Celeste at [facility] and updated her about patient's discharge. CSW asked if facility would still have a bed on Tuesday if discharged. Wille Celeste stated to follow up and they could let us know.  TOC team will continue to assist with discharge planning needs. Expected Discharge Plan: Skilled Nursing Facility Barriers to Discharge: Continued Medical Work up  Expected Discharge Plan and Services Expected Discharge Plan: Skilled Nursing Facility   Discharge Planning Services: CM Consult Post Acute Care Choice: Home Health Living arrangements for the past 2 months: Single Family Home                 DME Arranged: N/A DME Agency: NA       HH Arranged: RN, PT, OT HH Agency: [facility] Date [facility] Agency Contacted: 07/01/20 Time HH Agency Contacted: 1601 Representative spoke with at [facility] Agency: Lorenza Chick   Social Determinants of Health (SDOH) Interventions    Readmission Risk Interventions No flowsheet data found.

## 2020-07-12 NOTE — Progress Notes (Addendum)
SLP Cancellation Note  Patient Details Name: [name] MRN: [id number] DOB: 1931-04-20   Cancelled treatment:       Reason Eval/Treat Not Completed: Other (comment) (SLP's orders have been cancelled by Palliative care NP and Tammy Sours, RN indicated that the pt's family has decided on hospice care.)  Sherill Wegener I. Vear Clock, MS, CCC-SLP Acute Rehabilitation Services Office number [phone] Pager [phone]  Scheryl Marten 07/12/2020, 2:07 PM

## 2020-07-12 NOTE — Progress Notes (Signed)
   07/11/20 2359  BiPAP/CPAP/SIPAP  $ Non-Invasive Ventilator  Non-Invasive Vent Set Up;Non-Invasive Vent Initial  $ Non-Invasive Home Ventilator  Initial  BiPAP/CPAP/SIPAP Pt Type Adult  Mask Type Full face mask  Mask Size Medium  Set Rate 16 breaths/min  Respiratory Rate 23 breaths/min  IPAP 14 cmH20  EPAP 5 cmH2O  Oxygen Percent 50 %  Minute Ventilation 8.5  Leak 25  Peak Inspiratory Pressure (PIP) 14  Tidal Volume (Vt) 579  BiPAP/CPAP/SIPAP BiPAP  Patient Home Equipment No  Auto Titrate No  Press High Alarm 25 cmH2O  Press Low Alarm 5 cmH2O  BiPAP/CPAP /SiPAP Vitals  Pulse Rate (!) 117  Resp (!) 24  SpO2 99 %  pt. Placed on bipap pt. Wont keep mask on and Rn aware

## 2020-07-12 NOTE — Progress Notes (Signed)
PROGRESS NOTE  Chelsea GRAMAJO WJX:[id number] DOB: July 12, 1931   PCP: Kermit Balo, DO  Patient is from: Home  DOA: 06/26/2020 LOS: 14  Chief complaints: Shortness of breath  Brief Narrative / Interim history: 86 year old female with history of paroxysmal A. fib on Eliquis, diastolic CHF, HTN, hypothyroidism, anxiety and GERD presenting with shortness of breath and admitted for acute on chronic diastolic CHF, possible aspiration pneumonia and hyponatremia.  She was treated with IV Lasix and transition to p.o. Lasix.  She also completed antibiotic course for 7 days for aspiration pneumonia.  However, she continued to require significant oxygen.  CXR obtained on 07/07/2020 and showed a moderately large pleural effusion bilaterally.  Patient had bilateral thoracocentesis with removal of 750 cc from left and 850 cc from right.  Pleural fluid was transudative and culture negative.  Patient continued to have intermittent respiratory distress with significant hypoxia despite IV Lasix and bilateral thoracocentesis, breathing treatments and antibiotics. She also has had worsening encephalopathy. Repeat chest x-ray on 07/11/2020 with LLL and RLL opacity concerning for multifocal pneumonia which appears to be worse on my review. Made n.p.o. pending SLP evaluation. Restarted on IV Unasyn. She also went into A. fib with RVR with significant hypoxia the night of 11/27-11/28. Started on Cardizem drip. RVR resolved with Cardizem drip. Briefly started on BiPAP but she didn't tolerate. palliative medicine consulted on 07/12/2020. Family meeting held with patient's husband, son, daughter and daughter-in-law. After extensive discussion about patient's deteriorating condition, family felt full comfort measures would be to her best interest at this point.  Plan is transfer to [facility] when bed is available.  TOC consulted.  Subjective: Seen and examined earlier this morning. She was awake but not quite alert.  Only oriented to self. She responds no to pain or shortness of breath. She appears weak and tired.  Objective: Vitals:   07/12/20 0814 07/12/20 0854 07/12/20 1141 07/12/20 1301  BP:  122/73 125/72 118/68  Pulse: 87 88 90 89  Resp: 18 19 (!) 21 19  Temp:   98.5 F (36.9 C)   TempSrc:   Oral   SpO2: 100% 100% 100% 100%  Weight:      Height:        Intake/Output Summary (Last 24 hours) at 07/12/2020 1625 Last data filed at 07/11/2020 1653 Gross per 24 hour  Intake --  Output 200 ml  Net -200 ml   Filed Weights   07/07/20 0327 07/08/20 0432 07/09/20 0522  Weight: 62.8 kg 57.5 kg 64.7 kg    Examination: GENERAL: Frail looking elderly female. HEENT: MMM.  Vision and hearing grossly intact.  NECK: Supple.  No apparent JVD.  RESP:  No IWOB. Diminished bibasilar aeration. Rhonchi bilaterally. CVS: Irregular rhythm. HR in 70s. Heart sounds normal.  ABD/GI/GU: BS+. Abd soft, NTND.  MSK/EXT:  Moves extremities. Significant muscle mass and subcu fat loss. SKIN: no apparent skin lesion or wound NEURO: Awake but not quite alert. Oriented only to self. No focal neuro deficit but generalized weakness. PSYCH: Calm. No distress or agitation.  Procedures:  07/08/2020-left-sided thoracocentesis with removal of 750 cc transudative fluid  Microbiology summarized: COVID-19 and influenza PCR negative. Follow RVP panel negative. Pleural fluid culture negative.  Assessment & Plan: Goal of care discussion End-of-life care/comfort measures only/DNR/DNI -Comfort pathway with symptom management  Acute hypoxic respiratory failure: Multifactorial including CHF exacerbation, pneumonia and pleural effusion.  Recurrent aspiration with aspiration pneumonia: Repeat chest x-ray with LLL and RLL opacity worse on my  review  Acute on chronic diastolic heart failure: TTE on 11/14 with EF of 60 to 65%, RVSP of 51, severe LAE, severe RAE. Remained hypoxic despite diuresis with IV Lasix and  thoracocentesis.  Moderate bilateral pleural effusion-S/p bilateral thoracocentesis with removal of 750 cc from left and 850 cc from right.  Left pleural fluid transudative and culture negative.   Acute metabolic encephalopathy with waxing or waning mental status concerning for delirium  Paroxysmal A. Fib with RVR  Hyponatremia: Na 131> 135.  Mild hyperkalemia: K5.5> 3.5.  Resolved.  Metabolic alkalosis: Contraction alkalosis.  ABG 7.4/72/45/45 favoring metabolic alkalosis with respiratory compensation or resolving respiratory acidosis.   Hypothyroidism  Essential hypertension: Normotensive for most part.  GERD  Anxiety/insomnia:   Generalized deconditioning  Constipation  Possible urinary retention: Seems to have resolved after bethanechol.   Body mass index is 21.68 kg/m.         DVT prophylaxis:    Code Status: DNR/DNI Family Communication: Updated patient's family at bedside during family meeting Status is: Inpatient  Remains inpatient appropriate because:Unsafe d/c plan   Dispo: The patient is from: Home              [facility]              Anticipated d/c date is: 1 day              Patient currently is medically stable to d/c.  For transfer as of now.  Consultants:  Interventional radiology Palliative medicine   Sch Meds:  Scheduled Meds: . ipratropium  0.5 mg Nebulization Q8H  . levalbuterol  0.63 mg Nebulization Q8H  . LORazepam  1 mg Intravenous Once   Continuous Infusions: PRN Meds:.acetaminophen **OR** acetaminophen, bisacodyl, hydrocortisone, ipratropium-albuterol, LORazepam, morphine injection, ondansetron **OR** ondansetron (ZOFRAN) IV  Antimicrobials: Anti-infectives (From admission, onward)   Start     Dose/Rate Route Frequency Ordered Stop   07/11/20 1800  Ampicillin-Sulbactam (UNASYN) 3 g in sodium chloride 0.9 % 100 mL IVPB  Status:  Discontinued        3 g 200 mL/hr over 30 Minutes Intravenous  Every 6 hours 07/11/20 1637 07/12/20 1341   07/01/20 1400  Ampicillin-Sulbactam (UNASYN) 3 g in sodium chloride 0.9 % 100 mL IVPB        3 g 200 mL/hr over 30 Minutes Intravenous Every 8 hours 07/01/20 1250 07/07/20 2359   06/30/20 1145  amoxicillin-clavulanate (AUGMENTIN) 875-125 MG per tablet 1 tablet  Status:  Discontinued        1 tablet Oral Every 12 hours 06/30/20 1055 07/01/20 1138       I have personally reviewed the following labs and images: CBC: Recent Labs  Lab 07/08/20 0210 07/09/20 0058 07/10/20 0231 07/11/20 1652 07/12/20 0133  WBC 8.0 9.0 10.4 10.6* 7.1  NEUTROABS 6.6  --   --  9.2* 6.8  HGB 10.6* 11.1* 12.7 12.2 12.7  HCT 34.4* 35.9* 41.0 37.2 39.4  MCV 97.2 96.5 96.7 89.2 90.8  PLT 162 167 178 159 168   BMP &GFR Recent Labs  Lab 07/08/20 0210 07/08/20 0210 07/09/20 0058 07/09/20 0058 07/09/20 1620 07/10/20 0231 07/10/20 1746 07/11/20 0414 07/12/20 0133  NA 135   < > 132*   < > 130* 131* 137 135 137  K 4.7   < > 5.2*   < > 5.2* 5.5* 4.7 3.5 4.2  CL 85*   < > 84*   < > 84* 80* 80* 83*  87*  CO2 39*   < > 42*   < > 38* 45* 45* 41* 36*  GLUCOSE 116*   < > 106*   < > 197* 130* 126* 98 158*  BUN 22   < > 17   < > 18 17 15 16 21   CREATININE 0.53   < > 0.51   < > 0.49 0.56 0.67 0.65 1.00  CALCIUM 9.5   < > 9.4   < > 9.7 9.9 9.8 9.2 8.9  MG 2.0  --  1.9  --   --  1.7  --  2.0 2.0  PHOS  --   --  4.4   < > 4.4 4.9* 3.7 3.1 3.4   < > = values in this interval not displayed.   Estimated Creatinine Clearance: 40 mL/min (by C-G formula based on SCr of 1 mg/dL). Liver & Pancreas: Recent Labs  Lab 07/09/20 1620 07/10/20 0231 07/10/20 1746 07/11/20 0414 07/12/20 0133  ALBUMIN 2.5* 2.3* 2.4* 2.1* 2.3*   No results for input(s): LIPASE, AMYLASE in the last 168 hours. Recent Labs  Lab 07/10/20 1746 07/12/20 0133  AMMONIA 21 12   Diabetic: No results for input(s): HGBA1C in the last 72 hours. No results for input(s): GLUCAP in the last 168  hours. Cardiac Enzymes: No results for input(s): CKTOTAL, CKMB, CKMBINDEX, TROPONINI in the last 168 hours. No results for input(s): PROBNP in the last 8760 hours. Coagulation Profile: No results for input(s): INR, PROTIME in the last 168 hours. Thyroid Function Tests: No results for input(s): TSH, T4TOTAL, FREET4, T3FREE, THYROIDAB in the last 72 hours. Lipid Profile: No results for input(s): CHOL, HDL, LDLCALC, TRIG, CHOLHDL, LDLDIRECT in the last 72 hours. Anemia Panel: No results for input(s): VITAMINB12, FOLATE, FERRITIN, TIBC, IRON, RETICCTPCT in the last 72 hours. Urine analysis:    Component Value Date/Time   COLORURINE YELLOW 11/17/2019 1253   APPEARANCEUR CLOUDY (A) 11/17/2019 1253   LABSPEC 1.006 11/17/2019 1253   PHURINE 8.0 11/17/2019 1253   GLUCOSEU NEGATIVE 11/17/2019 1253   HGBUR NEGATIVE 11/17/2019 1253   BILIRUBINUR NEGATIVE 11/17/2019 1253   KETONESUR NEGATIVE 11/17/2019 1253   PROTEINUR NEGATIVE 11/17/2019 1253   UROBILINOGEN 1.0 07/22/2007 2239   NITRITE NEGATIVE 11/17/2019 1253   LEUKOCYTESUR NEGATIVE 11/17/2019 1253   Sepsis Labs: Invalid input(s): PROCALCITONIN, LACTICIDVEN  Microbiology: Recent Results (from the past 240 hour(s))  SARS Coronavirus 2 by RT PCR (hospital order, performed in [facility] hospital lab) Nasopharyngeal Nasopharyngeal Swab     Status: None   Collection Time: 07/07/20  4:35 PM   Specimen: Nasopharyngeal Swab  Result Value Ref Range Status   SARS Coronavirus 2 NEGATIVE NEGATIVE Final    Comment: (NOTE) SARS-CoV-2 target nucleic acids are NOT DETECTED.  The SARS-CoV-2 RNA is generally detectable in upper and lower respiratory specimens during the acute phase of infection. The lowest concentration of SARS-CoV-2 viral copies this assay can detect is 250 copies / mL. A negative result does not preclude SARS-CoV-2 infection and should not be used as the sole basis for treatment or other patient management decisions.  A negative  result may occur with improper specimen collection / handling, submission of specimen other than nasopharyngeal swab, presence of viral mutation(s) within the areas targeted by this assay, and inadequate number of viral copies (<250 copies / mL). A negative result must be combined with clinical observations, patient history, and epidemiological information.  Fact Sheet for Patients:   07/09/20  Fact Sheet for Healthcare  Providers: https://pope.com/  This test is not yet approved or  cleared by the [place] and has been authorized for detection and/or diagnosis of SARS-CoV-2 by FDA under an Emergency Use Authorization (EUA).  This EUA will remain in effect (meaning this test can be used) for the duration of the COVID-19 declaration under Section 564(b)(1) of the Act, 21 U.S.C. section 360bbb-3(b)(1), unless the authorization is terminated or revoked sooner.  Performed at [facility], [address], [place], [place] [address]   Gram stain     Status: None   Collection Time: 07/08/20  8:45 AM   Specimen: Lung, Right; Pleural Fluid  Result Value Ref Range Status   Specimen Description PLEURAL FLUID  Final   Special Requests RIGHT LUNG  Final   Gram Stain   Final    WBC PRESENT,BOTH PMN AND MONONUCLEAR NO ORGANISMS SEEN CYTOSPIN SMEAR Performed at [facility], [address] [place], [place] [address]    Report Status 07/08/2020 FINAL  Final  Acid Fast Smear (AFB)     Status: None   Collection Time: 07/08/20  8:45 AM   Specimen: Lung, Right; Pleural Fluid  Result Value Ref Range Status   AFB Specimen Processing Concentration  Final   Acid Fast Smear Negative  Final    Comment: (NOTE) Performed At: [facility] [address] [place], [place] [id number] Jolene Schimke MD QQ:[id number]    Source (AFB) RIGHT LUNG  Final    Comment: Performed at [facility], [address]  [place], [place] [address]  Culture, body fluid-bottle     Status: None (Preliminary result)   Collection Time: 07/08/20  8:45 AM   Specimen: Pleura  Result Value Ref Range Status   Specimen Description PLEURAL FLUID  Final   Special Requests RIGHT LUNG  Final   Culture   Final    NO GROWTH 4 DAYS Performed at [facility], [address], [place], [place] [address]    Report Status PENDING  Incomplete    Radiology Studies: No results found.   Juno Alers T. Derrel Moore [facility]  If 7PM-7AM, please contact night-coverage [email or web address] 07/12/2020, 4:25 PM

## 2020-07-12 NOTE — Consult Note (Addendum)
Palliative Medicine Inpatient Consult Note  Reason for consult:  Goals of Care  HPI:  Per intake H&P --> 86 year old female with history of paroxysmal A. fib on Eliquis, diastolic CHF, HTN, hypothyroidism, anxiety and GERD presenting with shortness of breath and admitted for acute on chronic diastolic CHF, possible aspiration pneumonia and hyponatremia.  Patient noted to be recurrently aspirating upon hospital stay.  Palliative care was asked to evaluate Ms. weeks in the setting of recurrent aspiration.  Over the last day she has had a significant increase in her oxygen demand and is more delirious.  Clinical Assessment/Goals of Care: I have reviewed medical records including EPIC notes, labs and imaging, received report from bedside RN, assessed the patient who is lying in bed pleasantly confused.    I called patient's daughter Marlowe Aschoff to further discuss diagnosis prognosis, GOC, EOL wishes, disposition and options.   I introduced Palliative Medicine as specialized medical care for people living with serious illness. It focuses on providing relief from the symptoms and stress of a serious illness. The goal is to improve quality of life for both the patient and the family.  Manuela Schwartz shares with me that her mother grew up in [place].  She is lived in [place] throughout the duration of her life.  She and her husband have been happily wed for over 43 years.  They share a son and a daughter.  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  She is identified as a very spry, quickwitted woman.  She gets joy out of being with her family.  She is a faithful woman and practices within the [employer] denomination.  We discussed normal his present health state and the concern that she is recurrently aspirating.  We discussed that she has of very notable increase in her oxygen needs and this is exceptionally worrisome.  In terms of her living situation normal lives with her husband at [employer] in  an apartment.  They had been "doing well" up until her hospitalization.  Manuela Schwartz does note that over the last 5 years in the setting of normalized macular degeneration she has had a steady decline in her overall functional abilities.  A detailed discussion was had today regarding advanced directives -declaration and desire for a natural death are on file. Patient's daughter Manuela Schwartz is  her healthcare power of attorney - a request for this documentation has been made.    Concepts specific to code status, artifical feeding and hydration, continued IV antibiotics and rehospitalization was had.  Patient has been very clear throughout her life stating that she would not want cardiopulmonary resuscitation nor would she want intubation.  She would not want heroic measures to prolong her life.  This is quite consistent with her declaration for natural death documentation.   The difference between a aggressive medical intervention path  and a palliative comfort care path for this patient at this time was had.  At this juncture I shared with Manuela Schwartz that Shonita's condition is exceptionally guarded and I worry that we will not be able to cure her.  I shared that there is great concern that she will further deteriorate despite the medical team's best of efforts.  I provided two options the first being continue doing what we are doing and hope for improvement in the second being to stop what we are doing and focus on the symptoms of normal.  We talked about transition to comfort measures in house and what that would entail inclusive of medications  to control pain, dyspnea, agitation, nausea, itching, and hiccups.   We discussed stopping all uneccessary measures such as blood draws, needle sticks, and frequent vital signs. Utilized reflective listening throughout our time together as this is clearly shocking to Manuela Schwartz given the patient's well state prior to hospital admission.  Discussed the importance of continued  conversation with family and their  medical providers regarding overall plan of care and treatment options, ensuring decisions are within the context of the patients values and GOCs.  Manuela Schwartz and I determine that we will meet today at 1:00 to identify the best next steps.  Decision Maker: Marlowe Aschoff (daughter) [phone]  SUMMARY OF RECOMMENDATIONS   DNAR/DNI  Appreciate medical team providing patient's daughter, Manuela Schwartz a formal update  Family meeting at 1:00 this afternoon  Ongoing palliative care support  Code Status/Advance Care Planning: DNAR/DNI   Palliative Prophylaxis:   Oral Care, ROM, Turn Q2H, Delirium and aspiration precautions  Additional Recommendations (Limitations, Scope, Preferences):  Continue current scope for the time being   Psycho-social/Spiritual:   Desire for further Chaplaincy support: Yes - [facility]  Additional Recommendations: Education on chronic disease and end of life processes   Prognosis: Very guarded at this point - at high risk for clinical decline  Discharge Planning: Discharge plan is uncertain right now.  Vitals:   07/12/20 0814 07/12/20 0854  BP:  122/73  Pulse: 87 88  Resp: 18 19  Temp:    SpO2: 100% 100%    Intake/Output Summary (Last 24 hours) at 07/12/2020 2423 Last data filed at 07/11/2020 1653 Gross per 24 hour  Intake --  Output 200 ml  Net -200 ml   Last Weight  Most recent update: 07/09/2020  5:37 AM   Weight  64.7 kg (142 lb 9.6 oz)           Gen:  Frail elderly F  HEENT: Dry lips and mucous membranes CV: Regular rate and rhythm  PULM: On 4LPM HFNC ABD: soft/nontender  EXT: (+) Pedal edema Neuro: Alert and oriented to self  PPS: 20%   This conversation/these recommendations were discussed with patient primary care team, Dr. Cyndia Skeeters  Time In: 0820 Time Out: 0930 Total Time: 64 Greater than 50%  of this time was spent counseling and coordinating care related to the above assessment and  plan. ______________________________________________________________________ Addendum:  I met with Dr. Cyndia Skeeters,  patients daughter, son, daughter in law, and husband at bedside. We discussed Alithia's present situation.   Dr. Cyndia Skeeters thoroughly explained nor by this recurrent aspirations, congestive heart failure, and ongoing delirium.  He shared that we have provided all treatments that we can to improve her current condition.  He reviewed that despite aggressive medical interventions over the past 14 days Rose-Marie has deteriorated.  He explained that initially there was the thought that her first aspirational pneumonia would reach resolution unfortunately, Shuree has had recurrent events of aspiration.  Dr. Cyndia Skeeters described that this is very problematic and could very well lead to normal his final demise.   Dr. Cyndia Skeeters went on to describe that there are two spectrums for patient care - one being very medically focused and providing each and every treatment humanly possible.  The other being considering the patient and what is important to them and focusing on symptom management.  Norm his family was very receptive to the latter option.  A formal description of delirium was provided and how it contributes to "waxing and waning" of cognitive function.  Descriptions of hyperactive and  hypoactive delirium were provided.  After a medical update was provided by Dr. Cyndia Skeeters he kindly excused himself.  I was able to speak with Norm his family in greater detail about what they think she would want if she can make this determination for herself.  Both the patient's daughter and son agree that the patient would not want to be in her present state.  She is identified as a very proud woman.  They share that she would want to pass with peace and dignity at this phase of her life.  We talked about transition to comfort measures in house and what that would entail inclusive of medications to control pain, dyspnea, agitation,  nausea, itching, and hiccups.   We discussed stopping all uneccessary measures such as blood draws, needle sticks, and frequent vital signs. Utilized reflective listening throughout our time together.   Decision was made by patient's family to transition to comfort focused care.  We discussed that if Hoheisel is stable for 24 hours we could consider transitioning her to an inpatient hospice facility.  Patient's family have chosen [facility].  Questions answered and palliative team is available for additional concerns.   Time In: 1300 Time Out: 1400 Total Time: 60  Greater than 50%  of this time was spent counseling and coordinating care related to the above assessment and plan.  [place] Team Team Cell Phone: [phone] Please utilize secure chat with additional questions, if there is no response within 30 minutes please call the above phone number  Palliative Medicine Team providers are available by phone from 7am to 7pm daily and can be reached through the team cell phone.  Should this patient require assistance outside of these hours, please call the patient's attending physician.

## 2020-07-13 LAB — CULTURE, BODY FLUID W GRAM STAIN -BOTTLE: Culture: NO GROWTH

## 2020-07-13 MED ORDER — MORPHINE SULFATE (CONCENTRATE) 10 MG/0.5ML PO SOLN
10.0000 mg | ORAL | Status: DC | PRN
  Administered 2020-07-13 (×2): 10 mg via ORAL
  Filled 2020-07-13 (×2): qty 0.5

## 2020-07-13 MED ORDER — LORAZEPAM 2 MG/ML PO CONC
1.0000 mg | ORAL | Status: DC | PRN
  Filled 2020-07-13: qty 0.5

## 2020-07-13 MED ORDER — GLYCOPYRROLATE 0.2 MG/ML IJ SOLN
0.4000 mg | INTRAMUSCULAR | Status: DC | PRN
  Administered 2020-07-13: 0.4 mg via SUBCUTANEOUS
  Filled 2020-07-13: qty 2

## 2020-07-14 DIAGNOSIS — I10 Essential (primary) hypertension: Secondary | ICD-10-CM | POA: Diagnosis not present

## 2020-07-15 NOTE — Progress Notes (Signed)
[facility] Liaison Note:  Unfortunately [facility] is not able to offer a room this morning.   An [facility] Liaison will update patient family and [facility] tomorrow or sooner if room becomes available.   Please do not hesitate to call with questions.  Thank you.   Roda Shutters, RN [facility] Liaison  [phone] [place] are on AMION

## 2020-07-15 NOTE — Progress Notes (Signed)
Patient passed away at 1800. Prounounced by Estelle June, RN and Minna Antis, RN. Husband was at bedside. Dr. Alanda Slim notified.

## 2020-07-15 NOTE — Death Summary Note (Signed)
DEATH SUMMARY   Patient Details  Name: [name] MRN: [id number] DOB: 12/27/1930  Admission/Discharge Information   Admit Date:  Jul 16, 2020  Date of Death: Date of Death: 08-01-20  Time of Death: Time of Death: 1800  Length of Stay: 01/16/2023  Referring Physician: Kermit Balo, DO   Reason(s) for Hospitalization  Shortness of breath  Diagnoses  Preliminary cause of death: Acute respiratory failure with hypoxia and hypercapnia (HCC) Secondary Diagnoses (including complications and co-morbidities):  End-of-life care/comfort measures only/DNR/DNI -Comfort pathway with symptom management -Waiting on bed at [facility]  Acute hypoxic respiratory failure with hypoxia and hypercapnia: Multifactorial including CHF exacerbation, pneumonia and pleural effusion.  Recurrent aspiration with aspiration pneumonia: Repeat CXR with new LLL and RLL concerning for recurrent aspiration pneumonitis/pneumonia.  Acute on chronic diastolic heart failure: TTE on 11/14 with EF of 60 to 65%, RVSP of 51, severe LAE, severe RAE. Remained hypoxic despite diuresis with IV Lasix and thoracocentesis.  Moderate bilateral pleural effusion-S/p bilateral thoracocentesis with removal of 750 cc from left and 850 cc from right.  Left pleural fluid transudative and culture negative.   Acute metabolic encephalopathy-multifactorial including pneumonia, respiratory failure and delirium  Paroxysmal A. Fib with RVR  Hyponatremia: Resolved.  Mild hyperkalemia:  Resolved.  Metabolic alkalosis: Contraction alkalosis or resolving respiratory acidosis.   Hypothyroidism: Stable  Essential hypertension: Normotensive for most part.  GERD  Anxiety/insomnia:   Generalized deconditioning  Constipation  Possible urinary retention: Resolved.  Brief Hospital Course (including significant findings, care, treatment, and services provided and events leading to death)  [name] is a 86 y.o. year old  female with history of paroxysmal A. fib on Eliquis, diastolic CHF, HTN, hypothyroidism, anxiety and GERD presenting with shortness of breath and admitted for acute on chronic diastolic CHF, possible aspiration pneumonia and hyponatremia.  She was treated with IV Lasix and transition to p.o. Lasix.  She also completed antibiotic course for 7 days for aspiration pneumonia.  However, she continued to require significant oxygen.  CXR obtained on 07/07/2020 and showed a moderately large pleural effusion bilaterally.  Patient had bilateral thoracocentesis with removal of 750 cc from left and 850 cc from right.  Pleural fluid was transudative and culture negative.  Patient continued to have intermittent respiratory distress with significant hypoxia despite IV Lasix and bilateral thoracocentesis, breathing treatments and antibiotics. She also has had worsening encephalopathy. Repeat chest x-ray on 07/11/2020 with LLL and RLL opacity concerning for multifocal pneumonia which appears to be worse on my review. Made n.p.o. pending SLP evaluation. Restarted on IV Unasyn. She also went into A. fib with RVR with significant hypoxia the night of 11/27-11/28. Started on Cardizem drip. RVR resolved with Cardizem drip. Briefly started on BiPAP but she didn't tolerate. palliative medicine consulted on 07/12/2020. Family meeting held with patient's husband, son, daughter and daughter-in-law. After extensive discussion about patient's deteriorating condition, family felt full comfort measures would be to her best interest at this point.  End-of-life care initiated.  Patient passed away with family at bedside on 01-Aug-2020 at 6 PM.    Pertinent Labs and Studies  Significant Diagnostic Studies DG Chest 1 View  Result Date: 07/10/2020 CLINICAL DATA:  Post thoracentesis. EXAM: CHEST  1 VIEW COMPARISON:  07/09/2020 chest radiograph and prior. FINDINGS: No pneumothorax. Decreased right pleural effusion. Perihilar and bibasilar  opacities are less conspicuous than prior exam. Increased conspicuity of left pleural effusion may be secondary to patient positioning. IMPRESSION: Decreased right pleural effusion. No pneumothorax.  Grossly unchanged small left pleural effusion. Electronically Signed   By: Stana Bunting M.D.   On: 07/10/2020 08:46   DG Chest 1 View  Result Date: 07/08/2020 CLINICAL DATA:  Status post left thoracentesis. EXAM: CHEST  1 VIEW COMPARISON:  July 07, 2020. FINDINGS: Decreased left pleural effusion status post thoracentesis. No visible pneumothorax. Similar layering right pleural effusion. Suspected right lower lobe collapse/atelectasis. Overlying bibasilar predominant opacities. Similar cardiomediastinal silhouette. Aortic atherosclerosis. IMPRESSION: 1. Decreased left pleural effusion status post thoracentesis. No visible pneumothorax. 2. Similar layering right pleural effusion with suspected right lower lobe atelectasis/collapse. 3. Additional bibasilar predominant opacities may represent atelectasis, aspiration, and/or pneumonia. Electronically Signed   By: Feliberto Harts MD   On: 07/08/2020 09:09   DG Chest 2 View  Result Date: 07/27/20 CLINICAL DATA:  Dyspnea EXAM: CHEST - 2 VIEW COMPARISON:  11/17/2019 FINDINGS: Moderate right pleural effusion has developed with compressive atelectasis of the right lung base. Small left pleural effusion has developed. The lungs are hyperinflated in keeping with underlying COPD. No pneumothorax. Cardiac size is within normal limits when accounting for moderate rotation on this examination. Pulmonary vascularity is normal. No acute bone abnormality. IMPRESSION: Interval development of bilateral pleural effusions, right greater than left, with associated compressive atelectasis. Electronically Signed   By: Helyn Numbers MD   On: 2020/07/27 22:42   CT CHEST WO CONTRAST  Result Date: 06/29/2020 CLINICAL DATA:  Abnormal radiograph. Pleural effusion.  Pneumonia. Possible aspiration clinically. EXAM: CT CHEST WITHOUT CONTRAST TECHNIQUE: Multidetector CT imaging of the chest was performed following the standard protocol without IV contrast. COMPARISON:  Plain films including most recent of earlier today. CTA chest 08/02/2016 FINDINGS: Cardiovascular: Aortic atherosclerosis. Moderate to marked cardiomegaly with biatrial enlargement. Multivessel coronary artery atherosclerosis. Mediastinum/Nodes: 1.1 cm precarinal node on 60/3. Hilar regions poorly evaluated without intravenous contrast. Lungs/Pleura: Small bilateral pleural effusions. Fluid or debris within the right sided endobronchial tree, including the bronchus intermedius and more peripheral branches, including on 72/4. Biapical pleuroparenchymal scarring. Minimal motion degradation inferiorly. Bilateral lower lobe and left upper lobe areas of airspace, ground-glass, and scattered "tree-in-bud" nodularity. In the right upper lobe, subpleural areas of mild airspace and ground-glass medially. Somewhat more confluent consolidation in the dependent right lower lobe and right infrahilar central right lower lobe. Upper Abdomen: Normal imaged portions of the liver, spleen, kidneys, right adrenal gland. Musculoskeletal: Accentuation of expected thoracic kyphosis. Convex left thoracic spine curvature is mild. IMPRESSION: 1. Multifocal pulmonary opacities, infection and/or aspiration. 2. Fluid in the right-sided endobronchial tree, for which aspiration should be excluded clinically. 3. Small bilateral pleural effusions. 4. Mild mediastinal adenopathy, favored to be reactive. 5. Minimal motion degradation inferiorly. 6. Aortic Atherosclerosis (ICD10-I70.0). Coronary artery atherosclerosis. Electronically Signed   By: Jeronimo Greaves M.D.   On: 06/29/2020 13:58   DG Chest Port 1 View  Result Date: 07/11/2020 CLINICAL DATA:  Hypoxia EXAM: PORTABLE CHEST 1 VIEW COMPARISON:  July 10, 2020 FINDINGS: Pleural effusions are  no longer appreciable. There is patchy airspace opacity in both lower lung regions with consolidation in a portion of the left lower lung region. There is scarring in the right upper lobe toward the apex. There is cardiomegaly with pulmonary venous hypertension. No adenopathy. There is aortic atherosclerosis. No bone lesions. IMPRESSION: Resolution of pleural effusions compared to 1 day prior. Airspace opacity with consolidation left lower lobe with more patchy airspace opacity right lower lobe consistent with multifocal pneumonia. Stable cardiac prominence with a degree of pulmonary vascular congestion.  No adenopathy appreciable. Aortic Atherosclerosis (ICD10-I70.0). Electronically Signed   By: Bretta Bang III M.D.   On: 07/11/2020 15:32   DG Chest Port 1 View  Result Date: 07/09/2020 CLINICAL DATA:  Shortness of breath.  Thoracentesis 07/08/2020 EXAM: PORTABLE CHEST 1 VIEW COMPARISON:  One-view chest x-ray 07/08/2020 and 07/07/2020 FINDINGS: The heart is enlarged. Atherosclerotic changes are noted at the aortic arch. Increasing diffuse interstitial edema is present. There is some reticulation of the left pleural effusion. Moderate right pleural effusion is stable. IMPRESSION: 1. Increasing diffuse interstitial edema. 2. Stable moderate right pleural effusion. Electronically Signed   By: Marin Roberts M.D.   On: 07/09/2020 16:56   DG CHEST PORT 1 VIEW  Result Date: 07/07/2020 CLINICAL DATA:  Shortness of breath EXAM: PORTABLE CHEST 1 VIEW COMPARISON:  07/01/2020 FINDINGS: Cardiomegaly. Layering bilateral pleural effusions with bibasilar atelectasis or infiltrate. Mild vascular congestion. Some improvement in aeration on the left since prior study. IMPRESSION: Moderate layering bilateral effusions with bibasilar atelectasis or infiltrates. Some improvement in aeration on the left since prior study. Electronically Signed   By: Charlett Nose M.D.   On: 07/07/2020 11:18   DG CHEST PORT 1  VIEW  Result Date: 07/01/2020 CLINICAL DATA:  Shortness of breath EXAM: PORTABLE CHEST 1 VIEW COMPARISON:  June 29, 2020 chest radiograph and chest CT FINDINGS: There are pleural effusions bilaterally with foci of airspace opacity in the left mid lung and both lower lung regions, similar to recent prior studies. There is also ill-defined opacity more superiorly in the left upper lobe. No new opacity is appreciable. Heart is upper normal in size with pulmonary vascularity within normal limits. No adenopathy. There is aortic atherosclerosis. No bone lesions. IMPRESSION: Pleural effusions bilaterally with multifocal pneumonia, more on the left than on the right, stable. Stable cardiac prominence. No adenopathy appreciable. Aortic Atherosclerosis (ICD10-I70.0). Electronically Signed   By: Bretta Bang III M.D.   On: 07/01/2020 08:27   DG CHEST PORT 1 VIEW  Result Date: 06/29/2020 CLINICAL DATA:  Hypoxia EXAM: PORTABLE CHEST 1 VIEW COMPARISON:  27-Jul-2020 FINDINGS: Pleural effusion on the right is stable. Pleural effusion on the left is slightly larger. There is ill-defined airspace opacity in the left mid lung and left lower lung regions, increased from 2 days prior. There is consolidation in the medial right base, stable. Heart is mildly with pulmonary vascularity normal. No adenopathy. There is aortic atherosclerosis. No bone lesions. IMPRESSION: Pleural effusions are currently present bilaterally, larger on the left and stable on the right. Consolidation medial right base is stable. Increase in airspace opacity left mid lung and left base regions, likely due to progression pneumonia in these areas. Stable mild cardiac prominence. Electronically Signed   By: Bretta Bang III M.D.   On: 06/29/2020 08:18   DG Swallowing Func-Speech Pathology  Result Date: 07/02/2020 Objective Swallowing Evaluation: Type of Study: MBS-Modified Barium Swallow Study  Patient Details Name: CARMELL ELGIN  MRN: [id number] Date of Birth: 05-03-1931 Today's Date: 07/02/2020 Time: SLP Start Time (ACUTE ONLY): 1345 -SLP Stop Time (ACUTE ONLY): 1414 SLP Time Calculation (min) (ACUTE ONLY): 29 min Past Medical History: Past Medical History: Diagnosis Date . Allergic rhinitis due to pollen  . Anxiety state, unspecified  . Calculus of kidney  . Cataract  . Diaphragmatic hernia without mention of obstruction or gangrene  . Diffuse cystic mastopathy  . Dizziness and giddiness  . GERD (gastroesophageal reflux disease)  . Hiatal hernia  . Insomnia, unspecified  .  Insomnia, unspecified  . Lumbago  . Macular degeneration (senile) of retina, unspecified  . Open wound of toe(s), without mention of complication  . Osteoarthrosis, unspecified whether generalized or localized, unspecified site  . Other and unspecified hyperlipidemia  . PAF (paroxysmal atrial fibrillation) (HCC)  . Personal history of fall  . Sciatica  . Sebaceous cyst  . Senile osteoporosis  . Unspecified constipation  . Unspecified essential hypertension  . Unspecified hereditary and idiopathic peripheral neuropathy  . Unspecified hypothyroidism  . Unspecified tinnitus  Past Surgical History: Past Surgical History: Procedure Laterality Date . ABDOMINAL HYSTERECTOMY   . CATARACT EXTRACTION, BILATERAL   . CHOLECYSTECTOMY  07/25/2007 . TONSILLECTOMY AND ADENOIDECTOMY Bilateral 1943 HPI: Pt is an 86 y.o. female with medical history significant for paroxysmal atrial fibrillation on Eliquis, hypertension, hypothyroidism, GERD, and anxiety who presented to the ED for evaluation of shortness of breath and cough.  CXR revealed interval development of bilateral pleural effusions, right greater than left, with associated compressive atelectasis. On date of admission, pt reported that she was taking her afternoon medications (Eliquis, Tylenol, and vitamin supplements) when she choked on the pills, began to have nonproductive coughing fits, and increased SOB. Pt reported to referring  MD, that she has "frequent issues with choking on her pills, most notably on her potassium supplement which she takes every morning." CT chest 11/15: Multifocal pulmonary opacities, infection and/or aspiration. Fluid in the right-sided endobronchial tree, for which aspiration should be excluded clinically. CXR 11/17: Pleural effusions bilaterally with multifocal pneumonia, more on the left. Bedside swallow evaluation on 11/14 was negative for signs of aspiration.  No data recorded Assessment / Plan / Recommendation CHL IP CLINICAL IMPRESSIONS 07/02/2020 Clinical Impression  Pt presented with oropharyngeal dysphagia characterized by impaired bolus cohesion, reduced lingual retraction, and reduced pharyngeal constriction. She demonstrated premature spillage to the valleculae, base of tongue residue, vallecular residue, and posterior pharyngeal wall residue. Incomplete epiglottic inversion was noted secondary to cervical kyphosis which limited range of motion of the epiglottis. Penetration (PAS 3) and aspiration (PAS 7, 8) were noted with thin liquids and nectar thick liquids via cup and straw due to incomplete epiglottic inversion. Moderate posterior pharyngeal wall residue was noted with regular texture solids; aspiration was noted with this consistency during secondary swallows due to residue being pushed into the larynx when epiglottic inversion was attempted. Penetration and aspiration were eliminated with nectar thick liquids when a chin tuck posture was used with a left head turn. However, penetration and aspiration were still inconsistently noted with thin liquids and with larger boluses of solids. Esophageal screening revealed retention of barium in the upper thoracic esophagus; consider dedicated esophageal assessment to determine etiology. A dysphagia 3 diet with nectar thick liquids is recommended at this time with strict observance of swallowing precautions noted below. SLP will follow for dysphagia  treatment.  SLP Visit Diagnosis Dysphagia, unspecified (R13.10) Attention and concentration deficit following -- Frontal lobe and executive function deficit following -- Impact on safety and function Mild aspiration risk;Moderate aspiration risk   CHL IP TREATMENT RECOMMENDATION 07/02/2020 Treatment Recommendations Therapy as outlined in treatment plan below   Prognosis 07/02/2020 Prognosis for Safe Diet Advancement Good Barriers to Reach Goals Severity of deficits Barriers/Prognosis Comment -- CHL IP DIET RECOMMENDATION 07/02/2020 SLP Diet Recommendations Dysphagia 3 (Mech soft) solids;Nectar thick liquid Liquid Administration via Cup;Straw Medication Administration Whole meds with puree Compensations Slow rate;Small sips/bites;Chin tuck Postural Changes Remain semi-upright after after feeds/meals (Comment);Seated upright at 90 degrees   CHL  IP OTHER RECOMMENDATIONS 07/02/2020 Recommended Consults Other (Comment) Oral Care Recommendations Oral care BID Other Recommendations --   CHL IP FOLLOW UP RECOMMENDATIONS 07/02/2020 Follow up Recommendations Home health SLP   CHL IP FREQUENCY AND DURATION 07/02/2020 Speech Therapy Frequency (ACUTE ONLY) min 2x/week Treatment Duration 2 weeks      CHL IP ORAL PHASE 07/02/2020 Oral Phase WFL Oral - Pudding Teaspoon -- Oral - Pudding Cup -- Oral - Honey Teaspoon -- Oral - Honey Cup -- Oral - Nectar Teaspoon -- Oral - Nectar Cup -- Oral - Nectar Straw -- Oral - Thin Teaspoon -- Oral - Thin Cup -- Oral - Thin Straw -- Oral - Puree -- Oral - Mech Soft -- Oral - Regular -- Oral - Multi-Consistency -- Oral - Pill -- Oral Phase - Comment --  CHL IP PHARYNGEAL PHASE 07/02/2020 Pharyngeal Phase Impaired Pharyngeal- Pudding Teaspoon -- Pharyngeal -- Pharyngeal- Pudding Cup -- Pharyngeal -- Pharyngeal- Honey Teaspoon -- Pharyngeal -- Pharyngeal- Honey Cup -- Pharyngeal -- Pharyngeal- Nectar Teaspoon -- Pharyngeal -- Pharyngeal- Nectar Cup Reduced epiglottic inversion;Delayed swallow  initiation-vallecula;Reduced tongue base retraction;Penetration/Aspiration during swallow;Penetration/Apiration after swallow;Trace aspiration;Moderate aspiration;Pharyngeal residue - valleculae;Pharyngeal residue - posterior pharnyx;Reduced pharyngeal peristalsis Pharyngeal Material enters airway, remains ABOVE vocal cords and not ejected out;Material enters airway, passes BELOW cords and not ejected out despite cough attempt by patient;Material enters airway, passes BELOW cords without attempt by patient to eject out (silent aspiration) Pharyngeal- Nectar Straw Reduced epiglottic inversion;Delayed swallow initiation-vallecula;Reduced tongue base retraction;Penetration/Aspiration during swallow;Penetration/Apiration after swallow;Trace aspiration;Moderate aspiration;Pharyngeal residue - valleculae;Pharyngeal residue - posterior pharnyx;Reduced pharyngeal peristalsis Pharyngeal Material enters airway, remains ABOVE vocal cords and not ejected out;Material enters airway, passes BELOW cords and not ejected out despite cough attempt by patient Pharyngeal- Thin Teaspoon -- Pharyngeal -- Pharyngeal- Thin Cup Reduced epiglottic inversion;Delayed swallow initiation-vallecula;Reduced tongue base retraction;Penetration/Aspiration during swallow;Penetration/Apiration after swallow;Trace aspiration;Moderate aspiration;Pharyngeal residue - valleculae;Pharyngeal residue - posterior pharnyx;Reduced pharyngeal peristalsis Pharyngeal Material enters airway, remains ABOVE vocal cords and not ejected out;Material enters airway, passes BELOW cords and not ejected out despite cough attempt by patient Pharyngeal- Thin Straw Reduced epiglottic inversion;Delayed swallow initiation-vallecula;Reduced tongue base retraction;Penetration/Aspiration during swallow;Penetration/Apiration after swallow;Trace aspiration;Moderate aspiration;Pharyngeal residue - valleculae;Pharyngeal residue - posterior pharnyx;Reduced pharyngeal peristalsis  Pharyngeal Material enters airway, remains ABOVE vocal cords and not ejected out;Material enters airway, passes BELOW cords and not ejected out despite cough attempt by patient;Material enters airway, passes BELOW cords without attempt by patient to eject out (silent aspiration) Pharyngeal- Puree Reduced epiglottic inversion;Delayed swallow initiation-vallecula;Reduced tongue base retraction;Pharyngeal residue - valleculae;Pharyngeal residue - posterior pharnyx;Reduced pharyngeal peristalsis Pharyngeal -- Pharyngeal- Mechanical Soft Reduced epiglottic inversion;Delayed swallow initiation-vallecula;Reduced tongue base retraction;Pharyngeal residue - valleculae;Pharyngeal residue - posterior pharnyx;Reduced pharyngeal peristalsis Pharyngeal -- Pharyngeal- Regular Reduced epiglottic inversion;Delayed swallow initiation-vallecula;Reduced tongue base retraction;Pharyngeal residue - valleculae;Pharyngeal residue - posterior pharnyx;Reduced pharyngeal peristalsis;Penetration/Apiration after swallow Pharyngeal Material enters airway, remains ABOVE vocal cords and not ejected out;Material enters airway, passes BELOW cords and not ejected out despite cough attempt by patient Pharyngeal- Multi-consistency -- Pharyngeal -- Pharyngeal- Pill Reduced epiglottic inversion;Delayed swallow initiation-vallecula;Reduced tongue base retraction;Pharyngeal residue - valleculae;Pharyngeal residue - posterior pharnyx;Reduced pharyngeal peristalsis Pharyngeal -- Pharyngeal Comment --  CHL IP CERVICAL ESOPHAGEAL PHASE 07/02/2020 Cervical Esophageal Phase Impaired Pudding Teaspoon -- Pudding Cup -- Honey Teaspoon -- Honey Cup -- Nectar Teaspoon -- Nectar Cup -- Nectar Straw -- Thin Teaspoon -- Thin Cup -- Thin Straw -- Puree (No Data) Mechanical Soft -- Regular -- Multi-consistency -- Pill -- Cervical Esophageal Comment Retention of barium  in the upper thoracic esophagus Shanika I. Vear Clock, MS, CCC-SLP Acute Rehabilitation Services Office  number [phone] Pager [phone] Scheryl Marten 07/02/2020, 4:00 PM              ECHOCARDIOGRAM COMPLETE  Result Date: 06/28/2020    ECHOCARDIOGRAM REPORT   Patient Name:   ISAURA SCHILLER Date of Exam: 06/28/2020 Medical Rec #:  [id number]     Height:       68.0 in Accession #:    [id number]    Weight:       115.1 lb Date of Birth:  04/06/1931     BSA:          1.616 m Patient Age:    86 years      BP:           146/89 mmHg Patient Gender: F             HR:           79 bpm. Exam Location:  Inpatient Procedure: 2D Echo, Color Doppler and Cardiac Doppler Indications:    CHF-Acute Diastolic 428.31 / I50.31  History:        Patient has prior history of Echocardiogram examinations, most                 recent 12/23/2019. Arrythmias:Atrial Fibrillation; Risk                 Factors:Hypertension and Dyslipidemia.  Sonographer:    Eulah Pont RDCS Referring Phys: [id number] VISHAL R PATEL IMPRESSIONS  1. Left ventricular ejection fraction, by estimation, is 60 to 65%. The left ventricle has normal function. The left ventricle has no regional wall motion abnormalities. Left ventricular diastolic function could not be evaluated.  2. Right ventricular systolic function is normal. The right ventricular size is normal. There is moderately elevated pulmonary artery systolic pressure. The estimated right ventricular systolic pressure is 51.0 mmHg.  3. Left atrial size was severely dilated.  4. Right atrial size was severely dilated.  5. The mitral valve is myxomatous. Mild to moderate mitral valve regurgitation.  6. Tricuspid valve regurgitation is mild to moderate.  7. The aortic valve is tricuspid. Aortic valve regurgitation is not visualized. Mild aortic valve sclerosis is present, with no evidence of aortic valve stenosis. FINDINGS  Left Ventricle: Left ventricular ejection fraction, by estimation, is 60 to 65%. The left ventricle has normal function. The left ventricle has no regional wall motion abnormalities.  The left ventricular internal cavity size was normal in size. There is  no left ventricular hypertrophy. Left ventricular diastolic function could not be evaluated due to atrial fibrillation. Left ventricular diastolic function could not be evaluated. Right Ventricle: The right ventricular size is normal. No increase in right ventricular wall thickness. Right ventricular systolic function is normal. There is moderately elevated pulmonary artery systolic pressure. The tricuspid regurgitant velocity is 3.28 m/s, and with an assumed right atrial pressure of 8 mmHg, the estimated right ventricular systolic pressure is 51.0 mmHg. Left Atrium: Left atrial size was severely dilated. Right Atrium: Right atrial size was severely dilated. Pericardium: There is no evidence of pericardial effusion. Mitral Valve: The mitral valve is myxomatous. There is moderate thickening of the mitral valve leaflet(s). Mild mitral annular calcification. Mild to moderate mitral valve regurgitation, with centrally-directed jet. Tricuspid Valve: The tricuspid valve is normal in structure. Tricuspid valve regurgitation is mild to moderate. Aortic Valve: The aortic valve is tricuspid. Aortic valve regurgitation is not visualized. Mild  aortic valve sclerosis is present, with no evidence of aortic valve stenosis. Pulmonic Valve: The pulmonic valve was normal in structure. Pulmonic valve regurgitation is trivial. Aorta: The aortic root and ascending aorta are structurally normal, with no evidence of dilitation. IAS/Shunts: No atrial level shunt detected by color flow Doppler.  LEFT VENTRICLE PLAX 2D LVIDd:         4.00 cm  Diastology LVIDs:         2.40 cm  LV e' medial:    8.43 cm/s LV PW:         1.00 cm  LV E/e' medial:  13.2 LV IVS:        0.80 cm  LV e' lateral:   7.14 cm/s LVOT diam:     2.00 cm  LV E/e' lateral: 15.5 LV SV:         58 LV SV Index:   36 LVOT Area:     3.14 cm  RIGHT VENTRICLE RV S prime:     8.17 cm/s TAPSE (M-mode): 1.3 cm  LEFT ATRIUM             Index       RIGHT ATRIUM           Index LA diam:        4.80 cm 2.97 cm/m  RA Area:     22.90 cm LA Vol (A2C):   79.0 ml 48.89 ml/m RA Volume:   66.90 ml  41.40 ml/m LA Vol (A4C):   53.9 ml 33.36 ml/m LA Biplane Vol: 72.8 ml 45.05 ml/m  AORTIC VALVE LVOT Vmax:   84.80 cm/s LVOT Vmean:  56.500 cm/s LVOT VTI:    0.186 m  AORTA Ao Root diam: 2.90 cm Ao Asc diam:  3.10 cm MITRAL VALVE                TRICUSPID VALVE MV Area (PHT): 4.68 cm     TR Peak grad:   43.0 mmHg MV Decel Time: 162 msec     TR Vmax:        328.00 cm/s MR PISA:        0.25 cm MR PISA Radius: 0.20 cm     SHUNTS MV E velocity: 111.00 cm/s  Systemic VTI:  0.19 m MV A velocity: 59.70 cm/s   Systemic Diam: 2.00 cm MV E/A ratio:  1.86 Mihai Croitoru MD Electronically signed by Thurmon Fair MD Signature Date/Time: 06/28/2020/2:16:03 PM    Final    Intravitreal Injection, Pharmacologic Agent - OD - Right Eye  Result Date: 06/24/2020 Time Out 06/24/2020. 11:29 AM. Confirmed correct patient, procedure, site, and patient consented. Anesthesia Topical anesthesia was used. Procedure Preparation included 10% betadine to eyelids, 5% betadine to ocular surface, Tobramycin 0.3%. A 30 gauge needle was used. Injection: 1.25 mg Bevacizumab (AVASTIN) SOLN   NDC: 70360-001-02, Lot: [id number]   Route: Intravitreal, Site: Right Eye, Waste: 0 mg Post-op Post injection exam found visual acuity of at least counting fingers. The patient tolerated the procedure well. There were no complications. Post injection medications were not given.   OCT, Retina - OU - Both Eyes  Result Date: 06/24/2020 Right Eye Quality was good. Scan locations included subfoveal. Central Foveal Thickness: 249. Left Eye Quality was good. Scan locations included subfoveal. Central Foveal Thickness: 421. Notes Central foveal atrophy OD, with juxta foveal CNVM CME OD, repeat injection Avastin OD today OS with encroachment of of large CNVM noted on OCT only,  follow-up as scheduled OS  IR THORACENTESIS ASP PLEURAL SPACE W/IMG GUIDE  Result Date: 07/10/2020 INDICATION: Patient with history of CHF, bilateral pleural effusions. Request is made for right therapeutic thoracentesis. EXAM: ULTRASOUND GUIDED THERAPEUTIC RIGHT THORACENTESIS MEDICATIONS: 10 mL 1% lidocaine COMPLICATIONS: None immediate. PROCEDURE: An ultrasound guided thoracentesis was thoroughly discussed with the patient and questions answered. The benefits, risks, alternatives and complications were also discussed. The patient understands and wishes to proceed with the procedure. Written consent was obtained. Ultrasound was performed to localize and mark an adequate pocket of fluid in the right chest. The area was then prepped and draped in the normal sterile fashion. 1% Lidocaine was used for local anesthesia. Under ultrasound guidance a 6 Fr Safe-T-Centesis catheter was introduced. Thoracentesis was performed. The catheter was removed and a dressing applied. FINDINGS: A total of approximately 850 mL of yellow fluid was removed. Samples were sent to the laboratory as requested by the clinical team. IMPRESSION: Successful ultrasound guided right therapeutic thoracentesis yielding 850 mL of pleural fluid. Read by: Loyce Dys PA-C Electronically Signed   By: Marliss Coots MD   On: 07/10/2020 14:15   IR THORACENTESIS ASP PLEURAL SPACE W/IMG GUIDE  Result Date: 07/08/2020 INDICATION: Patient with history CHF, paroxysmal AFib, possible community-acquired pneumonia/aspiration pneumonia - imaging shows bilateral pleural effusions. Request to IR for diagnostic and therapeutic thoracentesis. EXAM: ULTRASOUND GUIDED LEFT THORACENTESIS MEDICATIONS: 7 mL 1% lidocaine COMPLICATIONS: None immediate.  Post chest radiograph shows no pneumothorax. PROCEDURE: An ultrasound guided thoracentesis was thoroughly discussed with the patient and questions answered. The benefits, risks, alternatives and complications were  also discussed. The patient understands and wishes to proceed with the procedure. Written consent was obtained. Ultrasound was performed to localize and mark an adequate pocket of fluid in the left chest. The area was then prepped and draped in the normal sterile fashion. 1% Lidocaine was used for local anesthesia. Under ultrasound guidance a 6 Fr Safe-T-Centesis catheter was introduced. Thoracentesis was performed. The catheter was removed and a dressing applied. FINDINGS: A total of approximately 750 mL of clear yellow fluid was removed. Samples were sent to the laboratory as requested by the clinical team. IMPRESSION: Successful ultrasound guided left thoracentesis yielding 750 mL of pleural fluid. Read by Lynnette Caffey, PA-C Electronically Signed   By: Corlis Leak M.D.   On: 07/08/2020 09:08    Microbiology Recent Results (from the past 240 hour(s))  SARS Coronavirus 2 by RT PCR (hospital order, performed in [facility] hospital lab) Nasopharyngeal Nasopharyngeal Swab     Status: None   Collection Time: 07/07/20  4:35 PM   Specimen: Nasopharyngeal Swab  Result Value Ref Range Status   SARS Coronavirus 2 NEGATIVE NEGATIVE Final    Comment: (NOTE) SARS-CoV-2 target nucleic acids are NOT DETECTED.  The SARS-CoV-2 RNA is generally detectable in upper and lower respiratory specimens during the acute phase of infection. The lowest concentration of SARS-CoV-2 viral copies this assay can detect is 250 copies / mL. A negative result does not preclude SARS-CoV-2 infection and should not be used as the sole basis for treatment or other patient management decisions.  A negative result may occur with improper specimen collection / handling, submission of specimen other than nasopharyngeal swab, presence of viral mutation(s) within the areas targeted by this assay, and inadequate number of viral copies (<250 copies / mL). A negative result must be combined with clinical observations, patient history,  and epidemiological information.  Fact Sheet for Patients:   BoilerBrush.com.cy  Fact Sheet for Healthcare Providers:  https://pope.com/  This test is not yet approved or  cleared by the [place] and has been authorized for detection and/or diagnosis of SARS-CoV-2 by FDA under an Emergency Use Authorization (EUA).  This EUA will remain in effect (meaning this test can be used) for the duration of the COVID-19 declaration under Section 564(b)(1) of the Act, 21 U.S.C. section 360bbb-3(b)(1), unless the authorization is terminated or revoked sooner.  Performed at [facility] Lab, [address] [place], [place] [address]   Gram stain     Status: None   Collection Time: 07/08/20  8:45 AM   Specimen: Lung, Right; Pleural Fluid  Result Value Ref Range Status   Specimen Description PLEURAL FLUID  Final   Special Requests RIGHT LUNG  Final   Gram Stain   Final    WBC PRESENT,BOTH PMN AND MONONUCLEAR NO ORGANISMS SEEN CYTOSPIN SMEAR Performed at [facility], [address], [place], [place] [address]    Report Status 07/08/2020 FINAL  Final  Acid Fast Smear (AFB)     Status: None   Collection Time: 07/08/20  8:45 AM   Specimen: Lung, Right; Pleural Fluid  Result Value Ref Range Status   AFB Specimen Processing Concentration  Final   Acid Fast Smear Negative  Final    Comment: (NOTE) Performed At: [facility] [address] [place], [place] [id number] Jolene Schimke MD WG:[id number]    Source (AFB) RIGHT LUNG  Final    Comment: Performed at [facility], [address], [place], [place] [address]  Culture, body fluid-bottle     Status: None   Collection Time: 07/08/20  8:45 AM   Specimen: Pleura  Result Value Ref Range Status   Specimen Description PLEURAL FLUID  Final   Special Requests RIGHT LUNG  Final   Culture   Final    NO GROWTH 5 DAYS Performed at [facility] Lab, [address], [place], [place] [address]    Report Status 07/07/2020 FINAL  Final    Lab Basic Metabolic Panel: Recent Labs  Lab 07/08/20 0210 07/08/20 0210 07/09/20 0058 07/09/20 0058 07/09/20 1620 07/10/20 0231 07/10/20 1746 07/11/20 0414 07/12/20 0133  NA 135   < > 132*   < > 130* 131* 137 135 137  K 4.7   < > 5.2*   < > 5.2* 5.5* 4.7 3.5 4.2  CL 85*   < > 84*   < > 84* 80* 80* 83* 87*  CO2 39*   < > 42*   < > 38* 45* 45* 41* 36*  GLUCOSE 116*   < > 106*   < > 197* 130* 126* 98 158*  BUN 22   < > 17   < > CREATININE 0.53   < > 0.51   < > 0.49 0.56 0.67 0.65 1.00  CALCIUM 9.5   < > 9.4   < > 9.7 9.9 9.8 9.2 8.9  MG 2.0  --  1.9  --   --  1.7  --  2.0 2.0  PHOS  --   --  4.4   < > 4.4 4.9* 3.7 3.1 3.4   < > = values in this interval not displayed.   Liver Function Tests: Recent Labs  Lab 07/09/20 1620 07/10/20 0231 07/10/20 1746 07/11/20 0414 07/12/20 0133  ALBUMIN 2.5* 2.3* 2.4* 2.1* 2.3*   No results for input(s): LIPASE, AMYLASE in the last 168  hours. Recent Labs  Lab 07/10/20 1746 07/12/20 0133  AMMONIA 21 12   CBC: Recent Labs  Lab 07/08/20 0210 07/09/20 0058 07/10/20 0231 07/11/20 1652 07/12/20 0133  WBC 8.0 9.0 10.4 10.6* 7.1  NEUTROABS 6.6  --   --  9.2* 6.8  HGB 10.6* 11.1* 12.7 12.2 12.7  HCT 34.4* 35.9* 41.0 37.2 39.4  MCV 97.2 96.5 96.7 89.2 90.8  PLT 162 167 178 159 168   Cardiac Enzymes: No results for input(s): CKTOTAL, CKMB, CKMBINDEX, TROPONINI in the last 168 hours. Sepsis Labs: Recent Labs  Lab 07/09/20 0058 07/10/20 0231 07/11/20 1652 07/12/20 0133  PROCALCITON  --   --  <0.10 <0.10  WBC 9.0 10.4 10.6* 7.1    Procedures/Operations  Bilateral thoracocentesis as above   Solash Tullo T Mychael Smock 07/14/2020, 1:24 PM

## 2020-07-15 NOTE — Progress Notes (Signed)
Palliative Medicine RN Note: Symptom check.  Patient is awake, O2 via [place]. Family at bedside. She is working to breathe objectively, and she reports that she "has had better days." When asked if she is having trouble breathing, she said that she was "getting there," and she would like to try a dose of morphine. I explained that she will likely sleep, and she said she'd like to take a nap. Her family at bedside verbalized understanding of the use and side effects of morphine, as well as a desire for [name] to be comfortable.  I spoke with RN Myriam Jacobson. Both IVs have gone bad and been removed, so morphine orders were obtained for Roxanol sublingual from Dr Phillips Odor. She converted other IV meds as well. Plan for PMT follow up. I called Human resources officer with update.  Chelsea Chance Katlynne Mckercher, RN, BSN, [facility] Palliative Medicine Team 07-24-2020 4:44 PM Office [phone]

## 2020-07-15 NOTE — Progress Notes (Signed)
PROGRESS NOTE  LANNY DONOSO ZOX:[id number] DOB: Aug 27, 1930   PCP: Kermit Balo, DO  Patient is from: Home  DOA: 06/17/2020 LOS: 15  Chief complaints: Shortness of breath  Brief Narrative / Interim history: 86 year old female with history of paroxysmal A. fib on Eliquis, diastolic CHF, HTN, hypothyroidism, anxiety and GERD presenting with shortness of breath and admitted for acute on chronic diastolic CHF, possible aspiration pneumonia and hyponatremia.  She was treated with IV Lasix and transition to p.o. Lasix.  She also completed antibiotic course for 7 days for aspiration pneumonia.  However, she continued to require significant oxygen.  CXR obtained on 07/07/2020 and showed a moderately large pleural effusion bilaterally.  Patient had bilateral thoracocentesis with removal of 750 cc from left and 850 cc from right.  Pleural fluid was transudative and culture negative.  Patient continued to have intermittent respiratory distress with significant hypoxia despite IV Lasix and bilateral thoracocentesis, breathing treatments and antibiotics. She also has had worsening encephalopathy. Repeat chest x-ray on 07/11/2020 with LLL and RLL opacity concerning for multifocal pneumonia which appears to be worse on my review. Made n.p.o. pending SLP evaluation. Restarted on IV Unasyn. She also went into A. fib with RVR with significant hypoxia the night of 11/27-11/28. Started on Cardizem drip. RVR resolved with Cardizem drip. Briefly started on BiPAP but she didn't tolerate. palliative medicine consulted on 07/12/2020. Family meeting held with patient's husband, son, daughter and daughter-in-law. After extensive discussion about patient's deteriorating condition, family felt full comfort measures would be to her best interest at this point.  Plan is transfer to [facility] when bed is available.  TOC consulted.  Subjective: Seen and examined earlier this morning.  No major events overnight or this  morning.  Sleepy but wakes to voice.  Responds no to pain.  Doesn't appear to be in distress.  Objective: Vitals:   07/12/20 0814 07/12/20 0854 07/12/20 1141 07/12/20 1301  BP:  122/73 125/72 118/68  Pulse: 87 88 90 89  Resp: 18 19 (!) 21 19  Temp:   98.5 F (36.9 C)   TempSrc:   Oral   SpO2: 100% 100% 100% 100%  Weight:      Height:       No intake or output data in the 24 hours ending 2020-07-17 1421 Filed Weights   07/07/20 0327 07/08/20 0432 07/09/20 0522  Weight: 62.8 kg 57.5 kg 64.7 kg    Examination:  GENERAL: Frail. HEENT: MMM.  Vision and hearing grossly intact.  RESP: On 3 L by [place].  No IWOB.   MSK/EXT: No apparent deformity. No edema.  SKIN: no apparent skin lesion or wound NEURO: Sleepy but wakes to voice.  No apparent focal neuro deficit. PSYCH: Calm.  No distress or agitation.  Procedures:  07/08/2020-left-sided thoracocentesis with removal of 750 cc transudative fluid  Microbiology summarized: COVID-19 and influenza PCR negative. Follow RVP panel negative. Pleural fluid culture negative.  Assessment & Plan: Goal of care discussion End-of-life care/comfort measures only/DNR/DNI -Comfort pathway with symptom management -Waiting on bed at [facility]  Acute hypoxic respiratory failure: Multifactorial including CHF exacerbation, pneumonia and pleural effusion. -On 3 L by [place] for comfort.  Recurrent aspiration with aspiration pneumonia: Repeat CXR with new LLL and RLL concerning for recurrent aspiration pneumonitis/pneumonia.  Acute on chronic diastolic heart failure: TTE on 11/14 with EF of 60 to 65%, RVSP of 51, severe LAE, severe RAE. Remained hypoxic despite diuresis with IV Lasix and thoracocentesis.  Moderate bilateral pleural  effusion-S/p bilateral thoracocentesis with removal of 750 cc from left and 850 cc from right.  Left pleural fluid transudative and culture negative.   Acute metabolic encephalopathy-multifactorial including pneumonia,  respiratory failure and delirium  Paroxysmal A. Fib with RVR  Hyponatremia: Resolved.  Mild hyperkalemia:  Resolved.  Metabolic alkalosis: Contraction alkalosis or resolving respiratory acidosis.   Hypothyroidism: Stable  Essential hypertension: Normotensive for most part.  GERD  Anxiety/insomnia:   Generalized deconditioning  Constipation  Possible urinary retention: Resolved.   Body mass index is 21.68 kg/m.         DVT prophylaxis:    Code Status: DNR/DNI Family Communication: Updated patient's daughter at bedside. Status is: Inpatient  Remains inpatient appropriate because:Unsafe d/c plan   Dispo: The patient is from: Home              [facility]              Anticipated d/c date is: 1 day              Patient currently is medically stable to d/c.  For transfer as of now.  Consultants:  Interventional radiology Palliative medicine   Sch Meds:  Scheduled Meds: . ipratropium  0.5 mg Nebulization Q8H  . levalbuterol  0.63 mg Nebulization Q8H  . LORazepam  1 mg Intravenous Once   Continuous Infusions: PRN Meds:.acetaminophen **OR** acetaminophen, bisacodyl, glycopyrrolate, hydrocortisone, ipratropium-albuterol, LORazepam, morphine injection, ondansetron **OR** ondansetron (ZOFRAN) IV, traZODone  Antimicrobials: Anti-infectives (From admission, onward)   Start     Dose/Rate Route Frequency Ordered Stop   07/11/20 1800  Ampicillin-Sulbactam (UNASYN) 3 g in sodium chloride 0.9 % 100 mL IVPB  Status:  Discontinued        3 g 200 mL/hr over 30 Minutes Intravenous Every 6 hours 07/11/20 1637 07/12/20 1341   07/01/20 1400  Ampicillin-Sulbactam (UNASYN) 3 g in sodium chloride 0.9 % 100 mL IVPB        3 g 200 mL/hr over 30 Minutes Intravenous Every 8 hours 07/01/20 1250 07/07/20 2359   06/30/20 1145  amoxicillin-clavulanate (AUGMENTIN) 875-125 MG per tablet 1 tablet  Status:  Discontinued        1 tablet Oral Every 12 hours  06/30/20 1055 07/01/20 1138       I have personally reviewed the following labs and images: CBC: Recent Labs  Lab 07/08/20 0210 07/09/20 0058 07/10/20 0231 07/11/20 1652 07/12/20 0133  WBC 8.0 9.0 10.4 10.6* 7.1  NEUTROABS 6.6  --   --  9.2* 6.8  HGB 10.6* 11.1* 12.7 12.2 12.7  HCT 34.4* 35.9* 41.0 37.2 39.4  MCV 97.2 96.5 96.7 89.2 90.8  PLT 162 167 178 159 168   BMP &GFR Recent Labs  Lab 07/08/20 0210 07/08/20 0210 07/09/20 0058 07/09/20 0058 07/09/20 1620 07/10/20 0231 07/10/20 1746 07/11/20 0414 07/12/20 0133  NA 135   < > 132*   < > 130* 131* 137 135 137  K 4.7   < > 5.2*   < > 5.2* 5.5* 4.7 3.5 4.2  CL 85*   < > 84*   < > 84* 80* 80* 83* 87*  CO2 39*   < > 42*   < > 38* 45* 45* 41* 36*  GLUCOSE 116*   < > 106*   < > 197* 130* 126* 98 158*  BUN 22   < > 17   < > 18 17 15 16 21   CREATININE 0.53   < >  0.51   < > 0.49 0.56 0.67 0.65 1.00  CALCIUM 9.5   < > 9.4   < > 9.7 9.9 9.8 9.2 8.9  MG 2.0  --  1.9  --   --  1.7  --  2.0 2.0  PHOS  --   --  4.4   < > 4.4 4.9* 3.7 3.1 3.4   < > = values in this interval not displayed.   Estimated Creatinine Clearance: 40 mL/min (by C-G formula based on SCr of 1 mg/dL). Liver & Pancreas: Recent Labs  Lab 07/09/20 1620 07/10/20 0231 07/10/20 1746 07/11/20 0414 07/12/20 0133  ALBUMIN 2.5* 2.3* 2.4* 2.1* 2.3*   No results for input(s): LIPASE, AMYLASE in the last 168 hours. Recent Labs  Lab 07/10/20 1746 07/12/20 0133  AMMONIA 21 12   Diabetic: No results for input(s): HGBA1C in the last 72 hours. No results for input(s): GLUCAP in the last 168 hours. Cardiac Enzymes: No results for input(s): CKTOTAL, CKMB, CKMBINDEX, TROPONINI in the last 168 hours. No results for input(s): PROBNP in the last 8760 hours. Coagulation Profile: No results for input(s): INR, PROTIME in the last 168 hours. Thyroid Function Tests: No results for input(s): TSH, T4TOTAL, FREET4, T3FREE, THYROIDAB in the last 72 hours. Lipid  Profile: No results for input(s): CHOL, HDL, LDLCALC, TRIG, CHOLHDL, LDLDIRECT in the last 72 hours. Anemia Panel: No results for input(s): VITAMINB12, FOLATE, FERRITIN, TIBC, IRON, RETICCTPCT in the last 72 hours. Urine analysis:    Component Value Date/Time   COLORURINE YELLOW 11/17/2019 1253   APPEARANCEUR CLOUDY (A) 11/17/2019 1253   LABSPEC 1.006 11/17/2019 1253   PHURINE 8.0 11/17/2019 1253   GLUCOSEU NEGATIVE 11/17/2019 1253   HGBUR NEGATIVE 11/17/2019 1253   BILIRUBINUR NEGATIVE 11/17/2019 1253   KETONESUR NEGATIVE 11/17/2019 1253   PROTEINUR NEGATIVE 11/17/2019 1253   UROBILINOGEN 1.0 07/22/2007 2239   NITRITE NEGATIVE 11/17/2019 1253   LEUKOCYTESUR NEGATIVE 11/17/2019 1253   Sepsis Labs: Invalid input(s): PROCALCITONIN, LACTICIDVEN  Microbiology: Recent Results (from the past 240 hour(s))  SARS Coronavirus 2 by RT PCR (hospital order, performed in [facility] hospital lab) Nasopharyngeal Nasopharyngeal Swab     Status: None   Collection Time: 07/07/20  4:35 PM   Specimen: Nasopharyngeal Swab  Result Value Ref Range Status   SARS Coronavirus 2 NEGATIVE NEGATIVE Final    Comment: (NOTE) SARS-CoV-2 target nucleic acids are NOT DETECTED.  The SARS-CoV-2 RNA is generally detectable in upper and lower respiratory specimens during the acute phase of infection. The lowest concentration of SARS-CoV-2 viral copies this assay can detect is 250 copies / mL. A negative result does not preclude SARS-CoV-2 infection and should not be used as the sole basis for treatment or other patient management decisions.  A negative result may occur with improper specimen collection / handling, submission of specimen other than nasopharyngeal swab, presence of viral mutation(s) within the areas targeted by this assay, and inadequate number of viral copies (<250 copies / mL). A negative result must be combined with clinical observations, patient history, and epidemiological  information.  Fact Sheet for Patients:   BoilerBrush.com.cy  Fact Sheet for Healthcare Providers: https://pope.com/  This test is not yet approved or  cleared by the [place] FDA and has been authorized for detection and/or diagnosis of SARS-CoV-2 by FDA under an Emergency Use Authorization (EUA).  This EUA will remain in effect (meaning this test can be used) for the duration of the COVID-19 declaration under Section 564(b)(1) of  the Act, 21 U.S.C. section 360bbb-3(b)(1), unless the authorization is terminated or revoked sooner.  Performed at [facility], [address] [place], [place] [address]   Gram stain     Status: None   Collection Time: 07/08/20  8:45 AM   Specimen: Lung, Right; Pleural Fluid  Result Value Ref Range Status   Specimen Description PLEURAL FLUID  Final   Special Requests RIGHT LUNG  Final   Gram Stain   Final    WBC PRESENT,BOTH PMN AND MONONUCLEAR NO ORGANISMS SEEN CYTOSPIN SMEAR Performed at [facility] Lab, [address], [place], [place] [address]    Report Status 07/08/2020 FINAL  Final  Acid Fast Smear (AFB)     Status: None   Collection Time: 07/08/20  8:45 AM   Specimen: Lung, Right; Pleural Fluid  Result Value Ref Range Status   AFB Specimen Processing Concentration  Final   Acid Fast Smear Negative  Final    Comment: (NOTE) Performed At: [facility] [address] [place], [place] [id number] Jolene Schimke MD [id number]    Source (AFB) RIGHT LUNG  Final    Comment: Performed at [facility] Lab, [address], [place], [place] [address]  Culture, body fluid-bottle     Status: None   Collection Time: 07/08/20  8:45 AM   Specimen: Pleura  Result Value Ref Range Status   Specimen Description PLEURAL FLUID  Final   Special Requests RIGHT LUNG  Final   Culture   Final    NO GROWTH 5 DAYS Performed at [facility], [address] [place], [place]  [address]    Report Status 07/04/2020 FINAL  Final    Radiology Studies: No results found.   Shahzain Kiester T. Lennyn Bellanca [facility]  If 7PM-7AM, please contact night-coverage [email or web address] 06/21/2020, 2:21 PM

## 2020-07-15 DEATH — deceased

## 2020-08-03 ENCOUNTER — Ambulatory Visit: Payer: Medicare Other | Admitting: Internal Medicine

## 2020-08-05 ENCOUNTER — Encounter (INDEPENDENT_AMBULATORY_CARE_PROVIDER_SITE_OTHER): Payer: Medicare Other | Admitting: Ophthalmology

## 2020-08-20 LAB — ACID FAST CULTURE WITH REFLEXED SENSITIVITIES (MYCOBACTERIA): Acid Fast Culture: NEGATIVE

## 2020-11-27 ENCOUNTER — Ambulatory Visit: Payer: Medicare Other | Admitting: Cardiology

## 2021-02-12 ENCOUNTER — Encounter: Payer: Medicare Other | Admitting: Family
